# Patient Record
Sex: Male | Born: 1965
Health system: Southern US, Community
[De-identification: ages and names within clinical notes are randomized; demographics above are authoritative.]

## PROBLEM LIST (undated history)

## (undated) DIAGNOSIS — E039 Hypothyroidism, unspecified: Secondary | ICD-10-CM

## (undated) DIAGNOSIS — M545 Low back pain, unspecified: Secondary | ICD-10-CM

## (undated) DIAGNOSIS — R42 Dizziness and giddiness: Secondary | ICD-10-CM

## (undated) DIAGNOSIS — I639 Cerebral infarction, unspecified: Secondary | ICD-10-CM

## (undated) DIAGNOSIS — I10 Essential (primary) hypertension: Secondary | ICD-10-CM

## (undated) DIAGNOSIS — E669 Obesity, unspecified: Secondary | ICD-10-CM

## (undated) DIAGNOSIS — F419 Anxiety disorder, unspecified: Secondary | ICD-10-CM

## (undated) DIAGNOSIS — E7800 Pure hypercholesterolemia, unspecified: Secondary | ICD-10-CM

## (undated) DIAGNOSIS — R0602 Shortness of breath: Secondary | ICD-10-CM

## (undated) DIAGNOSIS — E119 Type 2 diabetes mellitus without complications: Secondary | ICD-10-CM

## (undated) HISTORY — PX: WRIST SURGERY: SHX841

## (undated) HISTORY — DX: Hypothyroidism, unspecified: E03.9

## (undated) HISTORY — DX: Obesity, unspecified: E66.9

## (undated) HISTORY — DX: Cerebral infarction, unspecified: I63.9

## (undated) HISTORY — PX: FOOT SURGERY: SHX648

## (undated) HISTORY — DX: Low back pain: M54.5

## (undated) HISTORY — DX: Pure hypercholesterolemia, unspecified: E78.00

## (undated) HISTORY — DX: Low back pain, unspecified: M54.50

## (undated) HISTORY — PX: SPINE SURGERY: SHX786

## (undated) HISTORY — PX: SHOULDER ARTHROSCOPY: SHX128

## (undated) HISTORY — DX: Essential (primary) hypertension: I10

## (undated) HISTORY — DX: Dizziness and giddiness: R42

## (undated) HISTORY — DX: Anxiety disorder, unspecified: F41.9

## (undated) HISTORY — DX: Shortness of breath: R06.02

---

## 1999-01-14 ENCOUNTER — Ambulatory Visit (HOSPITAL_COMMUNITY): Admission: RE | Admit: 1999-01-14 | Discharge: 1999-01-14 | Payer: Self-pay | Admitting: Family Medicine

## 1999-01-14 ENCOUNTER — Encounter: Payer: Self-pay | Admitting: Family Medicine

## 2007-02-16 HISTORY — PX: COLONOSCOPY: SHX174

## 2008-01-01 ENCOUNTER — Emergency Department (HOSPITAL_COMMUNITY): Admission: EM | Admit: 2008-01-01 | Discharge: 2008-01-01 | Payer: Self-pay | Admitting: Emergency Medicine

## 2008-01-05 ENCOUNTER — Ambulatory Visit: Payer: Self-pay | Admitting: Gastroenterology

## 2008-01-08 ENCOUNTER — Encounter: Payer: Self-pay | Admitting: Gastroenterology

## 2008-01-08 ENCOUNTER — Ambulatory Visit (HOSPITAL_COMMUNITY): Admission: RE | Admit: 2008-01-08 | Discharge: 2008-01-08 | Payer: Self-pay | Admitting: Gastroenterology

## 2008-01-08 ENCOUNTER — Ambulatory Visit: Payer: Self-pay | Admitting: Gastroenterology

## 2008-05-23 DIAGNOSIS — R197 Diarrhea, unspecified: Secondary | ICD-10-CM | POA: Insufficient documentation

## 2008-05-23 DIAGNOSIS — E119 Type 2 diabetes mellitus without complications: Secondary | ICD-10-CM

## 2008-05-23 DIAGNOSIS — J45909 Unspecified asthma, uncomplicated: Secondary | ICD-10-CM

## 2008-05-23 DIAGNOSIS — I10 Essential (primary) hypertension: Secondary | ICD-10-CM

## 2008-05-23 DIAGNOSIS — E7800 Pure hypercholesterolemia, unspecified: Secondary | ICD-10-CM

## 2008-05-23 DIAGNOSIS — Z8673 Personal history of transient ischemic attack (TIA), and cerebral infarction without residual deficits: Secondary | ICD-10-CM

## 2008-05-23 DIAGNOSIS — R109 Unspecified abdominal pain: Secondary | ICD-10-CM | POA: Insufficient documentation

## 2008-05-23 DIAGNOSIS — E1159 Type 2 diabetes mellitus with other circulatory complications: Secondary | ICD-10-CM

## 2008-05-23 DIAGNOSIS — Z794 Long term (current) use of insulin: Secondary | ICD-10-CM

## 2008-05-23 DIAGNOSIS — K7689 Other specified diseases of liver: Secondary | ICD-10-CM | POA: Insufficient documentation

## 2008-05-23 DIAGNOSIS — E039 Hypothyroidism, unspecified: Secondary | ICD-10-CM

## 2008-05-27 ENCOUNTER — Ambulatory Visit: Payer: Self-pay | Admitting: Gastroenterology

## 2008-05-27 ENCOUNTER — Encounter: Admission: RE | Admit: 2008-05-27 | Discharge: 2008-05-27 | Payer: Self-pay | Admitting: Family Medicine

## 2008-05-27 DIAGNOSIS — K529 Noninfective gastroenteritis and colitis, unspecified: Secondary | ICD-10-CM | POA: Insufficient documentation

## 2008-10-31 ENCOUNTER — Emergency Department (HOSPITAL_COMMUNITY): Admission: EM | Admit: 2008-10-31 | Discharge: 2008-10-31 | Payer: Self-pay | Admitting: Emergency Medicine

## 2009-12-25 ENCOUNTER — Encounter (INDEPENDENT_AMBULATORY_CARE_PROVIDER_SITE_OTHER): Payer: Self-pay | Admitting: *Deleted

## 2009-12-30 ENCOUNTER — Ambulatory Visit: Payer: Self-pay | Admitting: Internal Medicine

## 2009-12-30 DIAGNOSIS — E1169 Type 2 diabetes mellitus with other specified complication: Secondary | ICD-10-CM | POA: Insufficient documentation

## 2009-12-30 DIAGNOSIS — F17200 Nicotine dependence, unspecified, uncomplicated: Secondary | ICD-10-CM

## 2009-12-30 DIAGNOSIS — I6789 Other cerebrovascular disease: Secondary | ICD-10-CM

## 2009-12-30 DIAGNOSIS — E785 Hyperlipidemia, unspecified: Secondary | ICD-10-CM

## 2009-12-30 DIAGNOSIS — I80299 Phlebitis and thrombophlebitis of other deep vessels of unspecified lower extremity: Secondary | ICD-10-CM

## 2010-01-05 ENCOUNTER — Telehealth (INDEPENDENT_AMBULATORY_CARE_PROVIDER_SITE_OTHER): Payer: Self-pay | Admitting: Radiology

## 2010-01-06 ENCOUNTER — Ambulatory Visit: Payer: Self-pay | Admitting: Cardiology

## 2010-01-06 ENCOUNTER — Encounter (HOSPITAL_COMMUNITY)
Admission: RE | Admit: 2010-01-06 | Discharge: 2010-03-17 | Payer: Self-pay | Source: Home / Self Care | Attending: Cardiology | Admitting: Cardiology

## 2010-01-06 ENCOUNTER — Ambulatory Visit: Payer: Self-pay

## 2010-01-06 ENCOUNTER — Encounter: Payer: Self-pay | Admitting: Cardiology

## 2010-01-20 ENCOUNTER — Encounter: Payer: Self-pay | Admitting: Cardiology

## 2010-01-27 ENCOUNTER — Ambulatory Visit (HOSPITAL_COMMUNITY): Admission: RE | Admit: 2010-01-27 | Payer: Self-pay | Source: Home / Self Care | Admitting: Cardiology

## 2010-01-27 ENCOUNTER — Ambulatory Visit: Payer: Self-pay

## 2010-02-06 ENCOUNTER — Encounter: Payer: Self-pay | Admitting: Cardiology

## 2010-02-06 ENCOUNTER — Ambulatory Visit: Payer: Self-pay

## 2010-02-06 ENCOUNTER — Ambulatory Visit (HOSPITAL_COMMUNITY)
Admission: RE | Admit: 2010-02-06 | Discharge: 2010-02-06 | Payer: Self-pay | Source: Home / Self Care | Attending: Cardiology | Admitting: Cardiology

## 2010-03-19 NOTE — Assessment & Plan Note (Signed)
Summary: Cardiology Nuclear Testing  Nuclear Med Background Indications for Stress Test: Evaluation for Ischemia, Surgical Clearance  Indications Comments: Pre-op lowback surgery / Dr.Brooks  History: Asthma   Symptoms: Dizziness, DOE, SOB  Symptoms Comments: vertigo   Nuclear Pre-Procedure Cardiac Risk Factors: CVA, Hypertension, IDDM Type 2, Smoker Caffeine/Decaff Intake: none NPO After: 8:00 PM Lungs: clear IV 0.9% NS with Angio Cath: 22g     IV Site: R Hand IV Started by: Cathlyn Parsons, RN Chest Size (in) 52     Height (in): 67 Weight (lb): 277 BMI: 43.54 Tech Comments: Pt did not take any insulin,metformin or inhaler.  BS sugar at home 147.  Nuclear Med Study 1 or 2 day study:  1 day     Stress Test Type:  Eugenie Birks Reading MD:  Marca Ancona, MD     Referring MD:  S.Tennant Resting Radionuclide:  Technetium 3m Tetrofosmin     Resting Radionuclide Dose:  11 mCi  Stress Radionuclide:  Technetium 22m Tetrofosmin     Stress Radionuclide Dose:  32.9 mCi   Stress Protocol  Max Systolic BP: 140 mm Hg Lexiscan: 0.4 mg   Stress Test Technologist:  Milana Na, EMT-P     Nuclear Technologist:  Domenic Polite, CNMT  Rest Procedure  Myocardial perfusion imaging was performed at rest 45 minutes following the intravenous administration of Technetium 88m Tetrofosmin.  Stress Procedure  The patient received IV Lexiscan 0.4 mg over 15-seconds.  Technetium 22m Tetrofosmin injected at 30-seconds.  There were no significant changes with infusion.  Quantitative spect images were obtained after a 45 minute delay.  QPS Raw Data Images:  Normal; no motion artifact; normal heart/lung ratio. Stress Images:  Mild inferior perfusion defect.  Rest Images:  Mild inferior perfusion defect.  Subtraction (SDS):  Primarily fixed mild inferior perfusion defect.  Transient Ischemic Dilatation:  0.99  (Normal <1.22)  Lung/Heart Ratio:  0.36  (Normal <0.45)  Quantitative Gated Spect  Images QGS EDV:  146 ml QGS ESV:  76 ml QGS EF:  48 % QGS cine images:  Mild global hypokinesis.    Overall Impression  Exercise Capacity: Lexiscan with no exercise. BP Response: Normal blood pressure response. Clinical Symptoms: Short of breath.  ECG Impression: No significant ST segment change suggestive of ischemia. Overall Impression: Low risk overall with primarily fixed mild inferior perfusion defect without evidence for significant ischemia.  This could be diaphragmatic attenuation.  Overall Impression Comments: Would recommend echo to reassess LV systolic function.   Appended Document: Cardiology Nuclear Testing copy send to dr. Deborah Chalk

## 2010-03-19 NOTE — Progress Notes (Signed)
Summary: nuc pre-procedure  Phone Note Outgoing Call   Call placed by: Harlow Asa CNMT Call placed to: Patient Reason for Call: Confirm/change Appt Summary of Call: Reviewed information on My[employer] Information Sheet (see scanned document for further details).  Spoke with patient.  Initial call taken by: Harlow Asa CNMT     Nuclear Med Background Indications for Stress Test: Evaluation for Ischemia, Surgical Clearance  Indications Comments: Pre-op lowback surgery / Dr.Brooks  History: Asthma   Symptoms: Dizziness  Symptoms Comments: vertigo   Nuclear Pre-Procedure Cardiac Risk Factors: CVA, Hypertension, IDDM Type 2, Smoker Height (in): 67

## 2010-03-19 NOTE — Letter (Signed)
SummaryScience writer Pulmonary Care Appointment Letter  [facility]  [address]   [place], [place] [address]   Phone: [phone]  Fax: [phone]    12/25/2009 MRN: [id number]  Charles Pennington [address] [place], [place]  [address]  Dear [name],   Our office is attempting to contact you about an appointment.  Please call our office at [phone] to re-schedule this appointment with  one of our pulmonary dr's. Your appt with Dr. Delford Field has been canceled as he will not be in our office on Tues. Jan 06, 2010.  Our registration staff is prepared to assist you with any questions you may have.    Thank you,   Nature conservation officer Pulmonary Division

## 2010-03-19 NOTE — Assessment & Plan Note (Signed)
Summary: Pulmonary/ preop clearance for copd/ smoking > rx advair   Primary Charles Pennington/Referring Charles Pennington:  Charles Pennington   History of Present Illness: 23 yowm active smoker referred for preop neck surgery by Charles Pennington .   December 30, 2009  1st pulmonary office eval cc asthma since childhood goes away for months  and needs no rx  for months at time seems worse when leaves fall and when pollen shows up last dose around the first of november ,  worse cough usually with flare.  Pt denies any significant sore throat, dysphagia, itching, sneezing,  nasal congestion or excess secretions,  fever, chills, sweats, unintended wt loss, pleuritic or exertional cp, hempoptysis, change in activity tolerance  orthopnea pnd or leg swelling Pt also denies any obvious fluctuation in symptoms with weather or environmental change or other alleviating or aggravating factors x as above.  proaire always eliminates symptoms.  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 C V A / Stroke Deep Vein Thrombosis/Phlebitis Diabetes Hyperlipidemia Hypertension  Family History: mother with asthma   Social History: started in 1983--current 1/2 ppd single-lives alone-disabledSmoking Status:  current  Review of Systems  The patient denies anorexia, fever, weight loss, weight gain, vision loss, decreased hearing, hoarseness, chest pain, syncope, dyspnea on exertion, peripheral edema, prolonged cough, headaches, hemoptysis, abdominal pain, melena, hematochezia, severe indigestion/heartburn, hematuria, incontinence, genital sores, muscle weakness, suspicious skin lesions, transient blindness, difficulty walking, depression, unusual weight change, abnormal bleeding, enlarged lymph nodes, angioedema, breast masses, and testicular masses.     Vital Signs:  Patient profile:   45 year old male Height:      67 inches Weight:      267 pounds BMI:     41.97 O2 Sat:      96 % on Room air Temp:     98.9 degrees F oral Pulse rate:   80 / minute Pulse rhythm:   regular BP sitting:   126 / 82  (right arm)  Vitals Entered By: Philipp Deputy CMA (December 30, 2009 11:28 AM)  O2 Flow:  Room air  Physical Exam  Additional Exam:  wt 261 > 267 December 30, 2009 amb wm with congested sounding cough on fvc maneuver. HEENT: nl dentition, turbinates, and orophanx. Nl external ear canals without cough reflex NECK :  without JVD/Nodes/TM/ nl carotid upstrokes bilaterally LUNGS: no acc muscle use, clear to A and P bilaterally without cough on insp or exp maneuvers CV:  RRR  no s3 or murmur or increase in P2, no edema   ABD:  soft and nontender with nl excursion in the supine position. No bruits or organomegaly, bowel sounds nl MS:  warm without deformities, calf tenderness, cyanosis or clubbing SKIN: warm and dry without lesions   NEURO:  alert, approp, no deficits     Impression & Recommendations:  Problem # 1:  ASTHMA (ICD-493.90) More bronchitis than asthma at present suggestive of mucociliary dysfunction from smoking, rec stop x 2 weeks and advair 250 two times a day x 2 weeks preop    DDX of  difficult airways managment all start with A and  include Adherence, Ace Inhibitors, Acid Reflux, Active Sinus Disease, Alpha 1 Antitripsin deficiency, Anxiety masquerading as Airways dz,  ABPA,  allergy(esp in young), Aspiration (esp in elderly), Adverse effects  of DPI,  Active smokers, plus one B  = Beta blocker use..   Adherence: I spent extra time with the patient today explaining optimal DPI   technique.  This improved from  50-90%  Active smoking:  see #2  Problem # 2:  SMOKER (ICD-305.1)   I emphasized that although we never turn away smokers from the pulmonary clinic, we do ask that they understand that the recommendations that  were made won't work nearly as well in the presence of continued cigarette exposure and we may reach a point where we can't help the patient if he/she can't quit smoking.    I reviewed the Flethcher curve with her that basically says if you quit smoking when your best day FEV1 is still well preserved it is highly unlikely you will progress to severe disease and informed the patient there was no medication on the market that has proven to change the curve or the likelihood of progression   Orders: New Patient Level V (91478)  Medications Added to Medication List This Visit: 1)  Proair Hfa 108 (90 Base) Mcg/act Aers (Albuterol sulfate) .... 2 puffs every 4 hrs as needed 2)  Ambien 10 Mg Tabs (Zolpidem tartrate) .Marland Kitchen.. 1 by mouth at bedtime 3)  Amlodipine Besylate 5 Mg Tabs (Amlodipine besylate) .Marland Kitchen.. 1 by mouth once daily  Patient Instructions: 1)  Ideally you should stop smoking before surgery and use advair 250 one twice daily x 2 weeks 2)  You are cleared for surgery 3)  If you find that your breathing limits you we need to see you in [facility] for full PFT's

## 2010-03-23 ENCOUNTER — Ambulatory Visit (HOSPITAL_COMMUNITY)
Admission: RE | Admit: 2010-03-23 | Discharge: 2010-03-23 | Disposition: A | Discharge status: Home / Self Care | Payer: Medicare Other | Source: Ambulatory Visit | Attending: Orthopedic Surgery | Admitting: Orthopedic Surgery

## 2010-03-23 ENCOUNTER — Other Ambulatory Visit (HOSPITAL_COMMUNITY): Payer: Self-pay | Admitting: Orthopedic Surgery

## 2010-03-23 ENCOUNTER — Encounter (HOSPITAL_COMMUNITY)
Admission: RE | Admit: 2010-03-23 | Discharge: 2010-03-23 | Disposition: A | Discharge status: Home / Self Care | Payer: Medicare Other | Source: Ambulatory Visit | Attending: Family Medicine | Admitting: Family Medicine

## 2010-03-23 DIAGNOSIS — M48061 Spinal stenosis, lumbar region without neurogenic claudication: Secondary | ICD-10-CM

## 2010-03-23 DIAGNOSIS — Z01818 Encounter for other preprocedural examination: Secondary | ICD-10-CM | POA: Insufficient documentation

## 2010-03-23 LAB — BASIC METABOLIC PANEL
BUN: 14 mg/dL (ref 6–23)
Calcium: 9.8 mg/dL (ref 8.4–10.5)
Chloride: 98 mEq/L (ref 96–112)
Creatinine, Ser: 1.1 mg/dL (ref 0.4–1.5)

## 2010-03-23 LAB — CBC
MCH: 32.3 pg (ref 26.0–34.0)
MCHC: 35.1 g/dL (ref 30.0–36.0)
MCV: 92 fL (ref 78.0–100.0)
Platelets: 228 10*3/uL (ref 150–400)
RBC: 5.11 MIL/uL (ref 4.22–5.81)

## 2010-03-23 LAB — TYPE AND SCREEN

## 2010-03-23 LAB — ABO/RH: ABO/RH(D): A POS

## 2010-03-23 LAB — SURGICAL PCR SCREEN: Staphylococcus aureus: POSITIVE — AB

## 2010-03-25 ENCOUNTER — Inpatient Hospital Stay (HOSPITAL_COMMUNITY)
Admission: RE | Admit: 2010-03-25 | Discharge: 2010-03-29 | DRG: 453 | Disposition: A | Discharge status: Home / Self Care | Payer: Medicare Other | Source: Ambulatory Visit | Attending: Orthopedic Surgery | Admitting: Orthopedic Surgery

## 2010-03-25 ENCOUNTER — Inpatient Hospital Stay (HOSPITAL_COMMUNITY): Payer: Medicare Other

## 2010-03-25 DIAGNOSIS — J45909 Unspecified asthma, uncomplicated: Secondary | ICD-10-CM | POA: Diagnosis present

## 2010-03-25 DIAGNOSIS — F411 Generalized anxiety disorder: Secondary | ICD-10-CM | POA: Diagnosis present

## 2010-03-25 DIAGNOSIS — F17200 Nicotine dependence, unspecified, uncomplicated: Secondary | ICD-10-CM | POA: Diagnosis present

## 2010-03-25 DIAGNOSIS — E119 Type 2 diabetes mellitus without complications: Secondary | ICD-10-CM | POA: Diagnosis present

## 2010-03-25 DIAGNOSIS — M47817 Spondylosis without myelopathy or radiculopathy, lumbosacral region: Principal | ICD-10-CM | POA: Diagnosis present

## 2010-03-25 DIAGNOSIS — K219 Gastro-esophageal reflux disease without esophagitis: Secondary | ICD-10-CM | POA: Diagnosis present

## 2010-03-25 DIAGNOSIS — J95821 Acute postprocedural respiratory failure: Secondary | ICD-10-CM | POA: Diagnosis not present

## 2010-03-25 DIAGNOSIS — E039 Hypothyroidism, unspecified: Secondary | ICD-10-CM | POA: Diagnosis present

## 2010-03-25 DIAGNOSIS — I10 Essential (primary) hypertension: Secondary | ICD-10-CM | POA: Diagnosis present

## 2010-03-25 DIAGNOSIS — M4310 Spondylolisthesis, site unspecified: Secondary | ICD-10-CM | POA: Diagnosis present

## 2010-03-25 DIAGNOSIS — M5137 Other intervertebral disc degeneration, lumbosacral region: Secondary | ICD-10-CM | POA: Diagnosis present

## 2010-03-25 DIAGNOSIS — M51379 Other intervertebral disc degeneration, lumbosacral region without mention of lumbar back pain or lower extremity pain: Secondary | ICD-10-CM | POA: Diagnosis present

## 2010-03-25 DIAGNOSIS — Z8673 Personal history of transient ischemic attack (TIA), and cerebral infarction without residual deficits: Secondary | ICD-10-CM

## 2010-03-25 DIAGNOSIS — E7800 Pure hypercholesterolemia, unspecified: Secondary | ICD-10-CM | POA: Diagnosis present

## 2010-03-25 LAB — BLOOD GAS, ARTERIAL
Acid-Base Excess: 0.9 mmol/L (ref 0.0–2.0)
Bicarbonate: 26.1 mEq/L — ABNORMAL HIGH (ref 20.0–24.0)
FIO2: 1 %
O2 Saturation: 99.5 %
pO2, Arterial: 527 mmHg — ABNORMAL HIGH (ref 80.0–100.0)

## 2010-03-25 LAB — GLUCOSE, CAPILLARY

## 2010-03-26 ENCOUNTER — Inpatient Hospital Stay (HOSPITAL_COMMUNITY): Payer: Medicare Other

## 2010-03-26 DIAGNOSIS — J9600 Acute respiratory failure, unspecified whether with hypoxia or hypercapnia: Secondary | ICD-10-CM

## 2010-03-26 LAB — PROTIME-INR: INR: 0.99 (ref 0.00–1.49)

## 2010-03-26 LAB — GLUCOSE, CAPILLARY
Glucose-Capillary: 127 mg/dL — ABNORMAL HIGH (ref 70–99)
Glucose-Capillary: 188 mg/dL — ABNORMAL HIGH (ref 70–99)
Glucose-Capillary: 205 mg/dL — ABNORMAL HIGH (ref 70–99)
Glucose-Capillary: 252 mg/dL — ABNORMAL HIGH (ref 70–99)

## 2010-03-26 LAB — COMPREHENSIVE METABOLIC PANEL
Albumin: 2.8 g/dL — ABNORMAL LOW (ref 3.5–5.2)
Alkaline Phosphatase: 42 U/L (ref 39–117)
BUN: 15 mg/dL (ref 6–23)
CO2: 28 mEq/L (ref 19–32)
Chloride: 101 mEq/L (ref 96–112)
Creatinine, Ser: 1.12 mg/dL (ref 0.4–1.5)
GFR calc Af Amer: >60 mL/min (ref >60)
GFR calc non Af Amer: >60 mL/min (ref >60)
Potassium: 4.2 mEq/L (ref 3.5–5.1)
Sodium: 137 mEq/L (ref 135–145)
Total Bilirubin: 0.5 mg/dL (ref 0.3–1.2)

## 2010-03-26 LAB — CBC
MCHC: 34.3 g/dL (ref 30.0–36.0)
Platelets: 164 10*3/uL (ref 150–400)
RDW: 12.8 % (ref 11.5–15.5)

## 2010-03-26 LAB — APTT: aPTT: 26 seconds (ref 24–37)

## 2010-03-27 LAB — GLUCOSE, CAPILLARY
Glucose-Capillary: 282 mg/dL — ABNORMAL HIGH (ref 70–99)
Glucose-Capillary: 215 mg/dL — ABNORMAL HIGH (ref 70–99)

## 2010-03-27 LAB — CBC
Hemoglobin: 10 g/dL — ABNORMAL LOW (ref 13.0–17.0)
MCH: 32.1 pg (ref 26.0–34.0)
MCV: 93.9 fL (ref 78.0–100.0)
Platelets: 156 10*3/uL (ref 150–400)
RBC: 3.12 MIL/uL — ABNORMAL LOW (ref 4.22–5.81)

## 2010-03-27 LAB — BASIC METABOLIC PANEL
BUN: 11 mg/dL (ref 6–23)
CO2: 31 mEq/L (ref 19–32)
Chloride: 96 mEq/L (ref 96–112)
Creatinine, Ser: 0.9 mg/dL (ref 0.4–1.5)
Glucose, Bld: 240 mg/dL — ABNORMAL HIGH (ref 70–99)

## 2010-03-28 LAB — GLUCOSE, CAPILLARY
Glucose-Capillary: 220 mg/dL — ABNORMAL HIGH (ref 70–99)
Glucose-Capillary: 171 mg/dL — ABNORMAL HIGH (ref 70–99)

## 2010-03-29 LAB — GLUCOSE, CAPILLARY: Glucose-Capillary: 187 mg/dL — ABNORMAL HIGH (ref 70–99)

## 2010-03-31 LAB — POCT I-STAT 7, (LYTES, BLD GAS, ICA,H+H)
Acid-Base Excess: 3 mmol/L — ABNORMAL HIGH (ref 0.0–2.0)
Bicarbonate: 30.6 mEq/L — ABNORMAL HIGH (ref 20.0–24.0)
HCT: 38 % — ABNORMAL LOW (ref 39.0–52.0)
Hemoglobin: 13.3 g/dL (ref 13.0–17.0)
Hemoglobin: 12.9 g/dL — ABNORMAL LOW (ref 13.0–17.0)
Patient temperature: 36.8
Potassium: 3.7 mEq/L (ref 3.5–5.1)
Sodium: 139 mEq/L (ref 135–145)
TCO2: 32 mmol/L (ref 0–100)
TCO2: 31 mmol/L (ref 0–100)
pCO2 arterial: 44 mmHg (ref 35.0–45.0)
pH, Arterial: 7.332 — ABNORMAL LOW (ref 7.350–7.450)
pH, Arterial: 7.437 (ref 7.350–7.450)
pO2, Arterial: 132 mmHg — ABNORMAL HIGH (ref 80.0–100.0)

## 2010-04-02 LAB — GLUCOSE, CAPILLARY: Glucose-Capillary: 98 mg/dL (ref 70–99)

## 2010-04-02 NOTE — Op Note (Signed)
NAMEKAMARIAN, SAHAKIAN            ACCOUNT NO.:  [id number]  MEDICAL RECORD NO.:  [id number]           PATIENT TYPE:  O  LOCATION:  XRAY                         FACILITY:  [facility]  PHYSICIAN:  Alvy Beal, MD    DATE OF BIRTH:  1965/11/01  DATE OF PROCEDURE:  03/25/2010 DATE OF DISCHARGE:  03/23/2010                              OPERATIVE REPORT   PREOPERATIVE DIAGNOSIS:  Lumbar spondylolisthesis L3-4 with lumbar spinal stenosis, L3-4, L4-5.  POSTOPERATIVE DIAGNOSIS:  Lumbar spondylolisthesis L3-4 with lumbar spinal stenosis, L3-4, L4-5.  OPERATIVE PROCEDURE: 1. Anterolateral retroperitoneal approach for interbody fusion with     invasive 12 x 12 degree, 0 lordotic large cage. 2. Posterior lumbar decompression L3-4, L4-L5. 3. Posterior arthrodesis with autograft bone L3-4, posterior     instrumentation with segmental fixation with pedicle screw L3-4.  COMPLICATIONS:  None.  CONDITION:  Stable.  HISTORY:  This is a very pleasant 45 year old gentleman who has beenhaving severe debilitating back, buttock, and right radicular leg pain. Attempts at conservative management have failed, so we elected to proceed with surgery.  The patient had underlying instability at L3-4 and so the decision was made in addition to the decompression to prevent further iatrogenic instability to do a fusion.  All the risks, benefits, and alternatives were discussed with the patient.  Consent was obtained.  OPERATIVE NOTE:  The patient was brought to the operating room, placed supine on the operating table.  After successful induction of general anesthesia, endotracheal intubation, TED, SCDs, Foley, and lower extremity EMG neuromonitoring needles were placed.  The patient was then turned.  Appropriate time-out was then done confirming patient procedure and all other pertinent important data.  Once this was done, the patient was turned into left lateral decubitus position with the left side up. He  was then taped down to secure to the table appropriately.  Table was broke and the lateral side was prepped and draped in standard fashion. We confirmed satisfactory level and then a 3-inch incision was made over the lateral aspect over the L3-4 disk space.  Sharp dissection was carried out through the deep subcutaneous tissue to the fascia of the external oblique.  This was sharply incised.  A second incision was made approximately one fingerbreadth away from the first.  I then bluntly dissected through the soft tissue and paraspinal muscles until I hit the fascia.  I popped through the fascia and then I was able to palpate with my fingertip the transverse process as well as the iliopsoas.  I then rotated my finger up until I could palpate it through the first incision.  Using this 2 finger 2 incision technique, I was able to safely dissect into the retroperitoneal space for the probe.  I then advanced the trocar over my finger down to the iliopsoas.  I then used x- ray to confirm that I was at the appropriate level.  I then probed using neuro monitoring the iliopsoas to identify the position of the lumbar plexus.  Once I was safely away from the lumbar plexus, I then made a single pass through the iliopsoas down to the disk space.  I then placed a guide pin to hold my trocar to hold the dilator in place.  I then consensually dilated again testing in all 4 quadrants with neuro monitoring to confirm that there was no risk to the lumbar plexus.  I then placed the retracting tubes over the final dilator and secured it to the table.  I then was able to look down the portal to confirm satisfactory position.  I then locked the posterior blade in place and then exposed the L3-4 disk space.  Once this was properly exposed and I had adequate visualization, I placed the 2 lights down the cannula, so I had excellent visualization.  I confirmed satisfactory position in both the AP and lateral planes.   I then incised the annulus with a 10 blade scalpel and using a series of pituitary rongeurs, curettes, and Kerrison rongeurs, I removed all the disk material.  Using a Cobb elevator, I released the annulus from the contralateral side.  At this point, I was able to trial with appropriate sized rasps and elected to use the large 22 x 12 interbody cage.  This was packed with cortical cancellous chips mixed with DBX.  It was malleted to the appropriate depth.  I did rasp the endplates, so I had bleeding bone prior to placing the graft.  Once I had the graft at the appropriate depth, I confirmed satisfactory position in both planes.  Pleased with this location, I then irrigated the wound copiously with normal saline, used bipolar electrocautery to obtain hemostasis and then removed the retractors.  I then closed the fascia with interrupted #1 Vicryl suture, the superficial 2-0 Vicryl suture, and 3-0 Monocryl for the skin.  At this point with both incision sites closed, the patient was then turned supine on the operative table and then rotated onto the [employer] spine frame.  All bony prominences were well-padded, the arms were placed overhead, and the lumbar spine was prepped and draped in standard fashion.  The patient was redosed with antibiotics for the second portion of the procedure.  A midline incision was made starting at the superior aspect of L3 proceeding down to the midportion of L5.  Sharp dissection was carried out down to the deep fascia.  Deep fascia was sharply incised.  Using Cobb elevators, I resected the paraspinal muscles to expose the L3, L4, and L5 spinous processes and laminae.  With this completely exposed, I I then proceeded with the pedicle screw fixation.  I placed a probe through a lateral fascial stab incision and advanced down to the lateral aspect of the L3 pedicle.  I confirmed position in both the AP and lateral planes and then advanced the probe through the  pedicle and into the vertebral body.  At no point were there adverse radiographic or neuro monitoring events.  I repeated this procedure at L4 and on the contralateral side.  I then placed cannula down into the each to the pedicle.  At this point with all 4 pedicles properly positioned, I then tapped over each guide pin and then placed a 45-mm long screw.  The left L4 pedicle was malfunction and there was dissociation of the polyaxial head from the screw.  This was replaced with a 15-mm long screw.  At this point with all 4 pedicle screws in place, I then proceeded with the decompression.  I removed the spinous process of L5 and L4 and the majority that of L3.  There was significant osteophyte formation and facet disease.  Using osteotomes, I was able to remove the majority of the diseased facet complex.  I then used a small nerve hook to develop a plane underneath the L5 lamina and I performed a complete laminectomy of L5.  With the L4-5 completely decompressed, I then performed a complete laminectomy of L4 and a partial laminotomy of L3.  This allowed me excellent visualization and complete decompression centrally from L3 down to L5.  I then carried my dissection into the lateral recess. There was significant epidural veins which were coagulated with bipolar electrocautery.  Using Kerrison rongeurs, I was able to resect all the overhanging osteophyte until I was able to visualize the medial border of the L3, L4, and L5 pedicles.  At this point with the lateral recess decompression complete, I then did a generous foraminotomy down to L5 of L4 and L3.  This was then repeated on the contralateral side.  At this point with a very satisfactory decompression, I was able take a [facility], passed it superiorly, inferiorly, palpated the L3 pedicle medially and inferiorly, visualized and palpated the superior medial and inferior aspect of the L4 pedicle and palpated and visualized the  medial aspect of the L5 pedicle.  The Lorette Ang was able to clearly go out each of the foramen without difficulty and there was no evidence of breach of the pedicle by the pedicle screws.  At this point with the decompression complete, I copiously irrigated with normal saline, debrided the posterior lateral gutter and then packed the bone that I harvested from the decompression of the posterior lateral gutter.  I then took 2 rods, secured them to the screws, and torqued down the screws to tighten it. I then irrigated again, placed a thrombin-soaked Gelfoam patty over the exposed thecal sac, placed a drain, and then closed the deep fascia with interrupted #1 Vicryl suture, superficial 2-0 Vicryl suture, and a 3-0 Monocryl.  The drain stitch was also used.  Because of the patient's significant medical history, the decision was made to keep him intubated overnight for improved pain control.  I consulted the critical care team.  They will be involved in his care and control the event.  I did manage to allow the patient to wake up some in the OR and he was moving all 4 extremities.     Alvy Beal, MD     DDB/MEDQ  D:  03/25/2010  T:  03/26/2010  Job:  [id number]  Electronically Signed by Venita Lick MD on 04/02/2010 02:10:07 PM

## 2010-04-13 NOTE — Discharge Summary (Signed)
  NAMELENNYN, [name] NO.:  [id number]  MEDICAL RECORD NO.:  [id number]           PATIENT TYPE:  I  LOCATION:  5013                         FACILITY:  [facility]  PHYSICIAN:  Charles Pennington, Charles Pennington    DATE OF BIRTH:  [birth year]  DATE OF ADMISSION:  03/26/2010 DATE OF DISCHARGE:  03/29/2010                              DISCHARGE SUMMARY   ADMITTING DIAGNOSIS:  Lumbar degenerative spondylosis with radicular leg pain, L3-4, L4-5.  DISCHARGE DIAGNOSIS:  Lumbar degenerative spondylosis with radicular leg pain, L3-4, L4-5.  HISTORY:  This is a very pleasant 45 year old gentleman who was having significant back, buttock, and bilateral leg pain.  Clinical evaluation determined the patient had degenerative lumbar disk disease with a slight anterolisthesis at L3-4 with spinal stenosis at 3-4 and 4-5. Because of the failure of conservative management consisting of physical therapy, anti-inflammatory medications, observation, we elected to proceed with surgery.  All appropriate risks and benefits of surgery were discussed with the patient.  Patient's medical history includes hypertension, hypercholesterolemia, hypothyroidism, diabetes, asthma, he had a previous right hand surgeries.  He is on metformin, glyburide, amlodipine, lisinopril, levothyroxine, Crestor, Diflucan, Ambien, Xanax.  No known drug allergies.  On the day of admission, the patient underwent a lateral L3-4 interbody fusion, diskectomy, and decompression with posterior pedicle screw fixation and decompression at L3-4, with posterior L4-5 foraminotomy and decompression.  Because of the extensive procedure, postoperatively the patient was admitted, intubated to the ICU.  The decision was made to keep him intubated because of the lengthy procedural time.  On postoperative day #1, the patient was doing well.  He was alert and oriented x3 and he had no significant complaints.  The patient was extubated.  His  compartments were soft, nontender.  His incisions were clean, dry, and intact.  Ultimately, he was transferred from the ICU to the floor.  His Foley was removed.  X-rays were satisfactory.  He was ambulating, tolerating a regular diet.  He was seen by Physical Therapy, appropriate discharge instructions were provided.  Ultimately, he was discharged to home after clearing PT, having regular bowel movements, tolerating regular diet.  He was neurologically intact.  The patient will be discharged with preprinted instructions.  He will follow up with me.     Charles Pennington, Charles Pennington     DDB/MEDQ  D:  04/11/2010  T:  04/12/2010  Job:  [id number]  Electronically Signed by Venita Lick Charles Pennington on 04/13/2010 05:02:31 PM

## 2010-04-20 NOTE — Op Note (Signed)
Charles Pennington, SCHROETER NO.:  [id number]  MEDICAL RECORD NO.:  [id number]           PATIENT TYPE:  LOCATION:                                 FACILITY:  PHYSICIAN:  Alvy Beal, MD    DATE OF BIRTH:  1965/11/15  DATE OF PROCEDURE:  03/30/2010 DATE OF DISCHARGE:                              OPERATIVE REPORT   PREOPERATIVE DIAGNOSES: 1. Degenerative spinal stenosis L3-4, L4-5. 2. Neurogenic claudication secondary to lumbar spinal stenosis, 3-4     and 4-5 with degenerative slip at L3-4.  POSTOPERATIVE DIAGNOSES: 1. Degenerative spinal stenosis L3-4, L4-5. 2. Neurogenic claudication secondary to lumbar spinal stenosis, 3-4     and 4-5 with degenerative slip at L3-4.  OPERATIVE PROCEDURE: 1. Anterolateral interbody fusion L3-4 utilizing the NuVasive PEEK     spacer, size 16 packed with Osteocel. 2. Posterior decompression L3-4, L4-5. 3. Posterolateral arthrodesis, L3-4 with local bone (autograft). 4. Posterolateral segmental instrumentation with NuVasive pedicle     screw system.  COMPLICATIONS:  None.  CONDITION:  Stable.  HISTORY:  This is a very pleasant 45 year old gentleman who is under my care for sometime.  The patient was having significant debilitating back pain and bilateral leg pain.  Preoperative clinical imaging demonstrated degenerative disk disease with a slight instability at L3-4 along with spinal stenosis at 3-4 and 4-5.  Because of the failure of conservative management, which consisted of injection therapy, observation, pain medications, and physical therapy, we elected to proceed with surgery. All appropriate risks, benefits, and alternatives were explained to the patient and consent was obtained.  Prior to surgery because of his cardiac history, preoperative medical clearance was done.  The patient's past medical, surgical, family, and social history is included in my office notes, please refer to them for specifics.  OPERATIVE  NOTE:  The patient was brought to the operating room, placed supine on the operating table.  After successful induction of general anesthesia and endotracheal intubation, TEDs, SCDs, and Foley were inserted and appropriate intraoperative neuro monitoring devices were attached.  He was then put in the left decubitus position (left side up) and secured to the stretcher on a radiolucent table.  The table was then properly positioned and x-ray was brought in to confirm the proper position.  Once this was done, the lateral aspect of the body was prepped and draped in a standard fashion.  An appropriate time-out was then done to confirm patient, procedure, and all other pertinent port data.  At this point in time, using fluoroscopic guidance I identified the borders of the L3-4 disk space.  The skin was then infiltrated with 0.25% Marcaine with epinephrine and a lateral incision was made.  I then dissected through the subcutaneous tissue to the fascia of the external oblique and this was sharply incised.  I then made a counter incision one fingerbreadth posteriorly from the lateral incision.  I then advanced my finger to the deep fascia using Tresa Endo to pop through the deep fascia and this put me into the retroperitoneal space.  I then mobilized the retroperitoneal fat anteriorly and then brought my finger up to the lateral  incision that I had made.  I then palpated this through the lateral incision and then dissected bluntly through the oblique muscles until I saw my finger.  I then placed the dilating probes on top of my finger and advanced it down to the level of the psoas muscle.  I could freely palpate the ventral aspect of the transverse process and the psoas muscle.  At this point, I then checked x-ray to ensure that I was properly positioned over the L3-4 disk space at the junction of the anterior two-thirds, posterior one-third.  I then attached the neuro monitoring device and probed  the psoas to ensure that I was not near the lumbar plexus.  Once I confirmed radiographically and electrodiagnostically that I was not near the lumbar plexus and I was properly positioned over the disk space, I advanced the probe through the psoas to the disk space.  I then probed in all four quadrants, superior, anterior, inferiorly, and posteriorly to again ensure that I was in a safe working zone.  Once I confirmed this, I then sequentially dilated and with each dilation I probed again with the neurodiagnostics to ensure there was no pressure on the lumbar plexus.  Once I had the final probe down, I then placed the MIS blades down through the psoas on the lateral aspect of the disk space.  I then gently opened the retractors 2 clicks superior, inferior, and posteriorly, and then took an x-ray to confirm that I was again properly situated over the disk space.  Once this was confirmed, I secured the retracting system to the bed with the Omni retractor and then positioned it so that I had clear visualization of the lateral disk space.  I then placed a fourth retracting blade anteriorly.  The lip of this blade was just on the anterior surface of the disk space.  At this point, I had excellent visualization of the L3-4 disk space.  Again I confirmed in both the AP and lateral planes.  I was properly positioned at the L3-4 disk.  Once this was done, I proceeded with the diskectomy.  A 15-blade scalpel was used to perform an annulotomy and then I used pituitary rongeurs, curettes, and Kerrison rongeurs to remove all the disk material at L3-4. I then scraped the endplates to ensure I had bleeding bone and then I wrapped them.  I then made sure also with a Cobb elevator that I had released the annulus on the far lateral side.  Once I had done this, I then sequentially measured and then placed the appropriate-sized PEEK interbody cage, packed with Osteocel.  Once I had this properly positioned  at the appropriate depth, I confirmed satisfactory position in both the AP and lateral planes.  I had excellent fixation with a good press-fit.  At this point, I then irrigated the wound copiously with normal saline and then gently started removing the retractor coagulating any bleeding muscle with bipolar electrocautery.  Once the retractor was out, I then irrigated the wound copiously with normal saline again, reapproximated the fascia of the oblique with interrupted #1 Vicryl sutures, superficial 2-0 Vicryl sutures, and 3-0 Monocryl for the skin. I also closed the second incision I made with my finger with interrupted 2-0 Vicryl sutures and 3-0 Monocryl.  At this point with this portion of surgery completed, the patient was returned to the supine position on the operative table.  At this point, Jean Rosenthal table with the spine frame was brought into the surgical  suite and the patient was transferred into the prone position onto the [place] spine frame.  All bony prominences were well-padded.  The back was then prepped and draped again.  I then made an incision starting at the superior aspect of L3 proceeding to the inferior aspect of L4.  I then sharply dissected down to the deep fascia.  The deep fascia was sharply incised.  Using a Cobb elevator, I stripped paraspinal muscles to expose the L4 and L3 spinous processes, facet joints, and transverse process of L3 and L4.  Then using direct visualization as well as neurodiagnostics and fluoro view advanced the trocar through the transpedicular into the L3 vertebral body.  I confirmed satisfactory position in both the AP and lateral planes and there was no electrodiagnostic evidence of pedicle breach.  I then tapped and then placed an appropriate-sized pedicle screw at that level. I repeated this procedure on the contralateral side and again at the L4 level.  At this point with the pedicle screws in place, I proceeded with my decompression.  I  removed the entire spinous process of L4 and the majority of that of L3.  This bone was then saved for the posterolateral arthrodesis.  Once the spinous process was removed, using a micro nerve hook I developed a plane underneath the L4 lamina and using a 3-mm and 2-mm Kerrison performed a complete laminectomy of L4 and a subtotal laminotomy of L3.  This allowed me adequate central decompression.  I then proceeded out into the lateral gutter removing the osteophyte and thickened ligamentum flavum.  I carried my dissection out laterally until I could palpate and visualize the L3 and L4 pedicles.  At this point, I then proceeded inferiorly in order to ensure that the L4 neural foramen was adequately decompressed.  I removed the overhanging osteophytes from the facet joints to complete my lateral recess decompression.  I then took my 2 and 3-mm Kerrison out the neural foramen of L3 and L4 to also ensure that adequate foraminotomy.  This was done bilaterally.  Hemostasis was then obtained by bipolar electrocautery on the prominent epidural veins.  Once this was completed, I irrigated the wound copiously with normal saline and then palpated and visualized the pedicles of L3 and L4, confirming that there was no breach medially or inferiorly.  Once this was complete, I had an adequate decompression.  I then decorticated the transverse process of L3 and L4 and used the local bone that I had harvested and packed it into the posterolateral gutter for posterolateral arthrodesis.  I then placed thrombin-soaked Gelfoam patty over the exposed thecal sac and closed the wound in a layered fashion with interrupted #1 Vicryl sutures, 2-0 Vicryl sutures, and 3-0 Monocryl.  Steri-Strips and dry dressings were then applied.  The decision was made due to the length of surgery to keep the patient intubated for improved pain control; however, the anesthesia was light enough to visualize the moving all four  extremities.  PLAN:  At this point, the patient was transferred to the ICU because he remained intubated.     Alvy Beal, MD     DDB/MEDQ  D:  04/13/2010  T:  04/14/2010  Job:  [id number]  Electronically Signed by Venita Lick MD on 04/20/2010 05:21:04 PM

## 2010-06-30 NOTE — Consult Note (Signed)
NAME:  Charles Pennington, Charles Pennington NO.:  [id number]   MEDICAL RECORD NO.:  [id number]          PATIENT TYPE:  AMB   LOCATION:  DAY                           FACILITY:  [facility]   PHYSICIAN:  Kassie Mends, M.D.      DATE OF BIRTH:  07-Jul-1965   DATE OF CONSULTATION:  DATE OF DISCHARGE:                                 CONSULTATION   PRIMARY CARE PHYSICIAN:  Delaney Meigs, MD   REQUESTING PHYSICIAN:  Bethann Berkshire, MD   REASON FOR CONSULTATION:  Colitis.   HISTORY OF PRESENT ILLNESS:  Charles Pennington is a 45 year old Caucasian  male.  He developed acute abdominal pain mostly right lower quadrant 5  days ago when he awakened in the morning.  He rates the pain 8/10 pain  scale.  He describes it as a constant pain.  It feels like he was hit  with a baseball bat.  He was given a course of Cipro 500 mg b.i.d. and  Flagyl 500 mg q.i.d. and given Vicodin for pain and had been taking some  Vicodin for pain which does seem to help with his symptoms.  He was seen  in the ER.  He was found to have a white blood cell count of 10.2,  hemoglobin 40.9, hematocrit 43.1, and platelets 156.  He had sodium 133,  potassium 3.3, glucose 47, otherwise normal CMP.  He had a normal acute  abdominal film, and he was found to have fatty liver and inflammatory  changes along the mid descending colon, large calcified phlebolith  versus calcified diverticulum present anterior to the rectum, mesenteric  vascular appeared normal.  He has had some anorexia.  He denies any  NSAID use other than his daily meloxicam.  His weight had remained  stable.  He denies any fatigue.  He denies any antibiotics or recent  travel.  He does have city water.  He does note that he drinks a lot of  milk and feels that this could be part of the problem.   PAST MEDICAL AND SURGICAL HISTORY:  He had a CVA as it is described, not  quite sure if this is embolic or from AVM, diabetes mellitus,  hypertension, hypercholesterolemia,  hypothyroidism, asthma, right hand  surgery.   CURRENT MEDICATIONS:  1. Flagyl 500 mg q.i.d.  2. Cipro 500 mg b.i.d.  3. Paxil 40 mg daily.  4. Levothyroxine 100 mg daily.  5. Meloxicam 7.5 mg b.i.d.  6. Crestor 20 mg daily.  7. Hydrocodone p.r.n.  8. ProAir HFA daily.  9. Lorazepam 0.5 mg b.i.d.  10.Metformin 1 g b.i.d.  11.Glipizide 10 mg daily.  12.Omeprazole 20 mg daily.  13.Zolpidem once daily.  14.Amlodipine once daily.  15.Lisinopril/hydrochlorothiazide 20 mg daily.  16.Aspirin 81 mg daily   ALLERGIES:  No known drug allergies.   FAMILY HISTORY:  There is no known family history of colorectal  carcinoma or other chronic GI problems.  Father deceased at 33 with  hypertension.  Mother alive with history of diabetes mellitus.  He has 2  healthy siblings.   SOCIAL HISTORY:  Charles Pennington has never been  married.  He has 3 healthy  children, ages 49, 35, and 53.  He is a disabled Personnel officer.  He has a  25 pack-year history of tobacco use.  He denies any drug use.  He  consumes a couple of beers per month.   REVIEW OF SYSTEMS:  See HPI, otherwise negative.   PHYSICAL EXAMINATION:  VITAL SIGNS:  Weight 268 pounds, height 58  inches, temp 97.8, blood pressure 120/88, pulse 72.  GENERAL:  He is an obese Caucasian male who is alert, pleasant,  cooperative in no acute distress.  HEENT:  Sclerae clear, nonicteric.  Conjunctivae pick.  Oropharynx pink  and moist without lesions.  NECK:  Supple without mass or thyromegaly.  CHEST:  Heart regular rate.  Normal S1 and S2.  No murmurs, clicks,  rubs, or gallops.  LUNGS:  He does have a mild expiratory wheeze, but is in no acute  distress bilaterally.  ABDOMEN:  Positive bowel sounds x4.  No bruits auscultated.  Soft and  nondistended.  He has mild tenderness to the entire abdomen on deep  palpation.  There is no rebound, tenderness, or guarding.  No  hepatosplenomegaly or mass.  Exam is limited given the patient's body   habitus.  EXTREMITIES:  Without edema.  He does have clubbing.   IMPRESSION:  Charles Pennington is a 45 year old Caucasian male with an acute  episode of colitis.  He has been started on antibiotics at this time.  He has seen minimal improvement, continues to have around 6 loose stools  per day.  He continues to have abdominal pain.  He is going to require  further evaluation to rule out inflammatory bowel disease, infection, or  less likely ischemia.   PLAN:  1. Colonoscopy with Dr. Cira Servant in the near future.  Discussed procedure      including the risks and benefits, which include but not limited to      bleeding, infection, perforation, drug reaction.  He agrees to the      plan and consent will be obtained, and colonoscopy will be done as      soon as possible.  2. Vicodin 5/500 mg one p.o. q.4-6 h. p.r.n. severe pain #25 with no      refills.  3. Continue to drink plenty of liquids.  4. If he has any further problems in the interim, he is going to go to      the emergency room or call us.  mood.   Thank you Dr. Estell Harpin for allowing [place] to participate in the care of Mr.  Pennington.      Lorenza Burton, N.P.      Kassie Mends, M.D.  Electronically Signed    KJ/MEDQ  D:  01/05/2008  T:  01/06/2008  Job:  [id number]   cc:   Delaney Meigs, M.D.  Fax: [phone]

## 2010-06-30 NOTE — Op Note (Signed)
NAMEODEAN, Charles NO.:  [id number]   MEDICAL RECORD NO.:  [id number]          PATIENT TYPE:  AMB   LOCATION:  DAY                           FACILITY:  [facility]   PHYSICIAN:  Kassie Mends, M.D.      DATE OF BIRTH:  [birth year]   DATE OF PROCEDURE:  DATE OF DISCHARGE:                                PROCEDURE NOTE   REFERRING PHYSICIAN:  Bethann Berkshire, MD   PRIMARY CARE PHYSICIAN:  Delaney Meigs, MD   PROCEDURE:  Ileocolonoscopy with cold forceps biopsy.   INDICATION FOR EXAM:  Charles Pennington is a 45 year old male who presented  with a sudden onset of abdominal pain and diarrhea.  He has been on  Cipro and Flagyl since January 01, 2008, and is having 6 loose stools a  day.  He denies any rectal bleeding.   FINDINGS:  1. Patchy erythema with erosions and ulcerations seen in the proximal      ascending colon as well as the cecum.  Biopsies obtained via cold      forceps to evaluate for inflammatory bowel disease or malignancy.  2. Normal terminal ileum, approximately 5 cm visualized.  No colon      polyps, masses, diverticula, or AVMs seen.  3. Normal retroflexed view of the rectum.   DIAGNOSIS:  Colitis, likely resolving from an infectious etiology.  The  differential diagnosis still includes inflammatory bowel disease or  malignancy.   RECOMMENDATIONS:  1. Screening colonoscopy in 10 years with propofol.  2. Will call Charles Pennington with the results of his biopsies.  3. Follow up with Lorenza Burton in 6 weeks regarding his colitis.  4. He should complete his antibiotic therapy.  He should stop aspirin      and anti-inflammatory drugs for 30 days.  5. He should follow a lactose-free, high-fiber diet.  He was given a      handout on a lactose-free, high-fiber diet.   MEDICATIONS:  1. Demerol 200 mg.  2. Versed 10 mg.  3. Phenergan 25 mg.   PROCEDURE TECHNIQUE:  Physical exam was performed.  Informed consent was  obtained from the patient after explaining  the benefits, risks, and  alternatives to the procedure.  The patient was connected to the monitor  and placed in the left lateral position.  Continuous oxygen was provided  by nasal cannula.  IV medicine administered through an indwelling  cannula.  After administration of sedation and rectal exam, the  patient's rectum was intubated, and the scope advanced under direct  visualization to the distal terminal ileum.  The scope was removed  slowly by  carefully examining the color, texture, anatomy, and integrity of the  mucosa on the way out.  The patient was recovered in endoscopy and  discharged home in satisfactory condition.   PATH:  Colitis, likley secondary to infection.      Kassie Mends, M.D.  Electronically Signed     SM/MEDQ  D:  01/08/2008  T:  01/09/2008  Job:  [id number]   cc:   Delaney Meigs, M.D.  Fax: [phone]

## 2010-11-18 LAB — COMPREHENSIVE METABOLIC PANEL
ALT: 51
CO2: 28
Calcium: 8.7
Creatinine, Ser: 0.94
GFR calc Af Amer: >60
GFR calc non Af Amer: >60
Glucose, Bld: 157 — ABNORMAL HIGH
Sodium: 133 — ABNORMAL LOW
Total Protein: 6.5

## 2010-11-18 LAB — DIFFERENTIAL
Eosinophils Absolute: 0.3
Lymphocytes Relative: 27
Lymphs Abs: 2.8
Monocytes Relative: 6
Neutrophils Relative %: 64

## 2010-11-18 LAB — URINALYSIS, ROUTINE W REFLEX MICROSCOPIC
Glucose, UA: NEGATIVE
Hgb urine dipstick: NEGATIVE
Protein, ur: NEGATIVE
Specific Gravity, Urine: 1.015
pH: 6.5

## 2010-11-18 LAB — GLUCOSE, CAPILLARY: Glucose-Capillary: 295 — ABNORMAL HIGH

## 2010-11-18 LAB — LIPASE, BLOOD: Lipase: 17

## 2010-11-18 LAB — CBC
Hemoglobin: 14.9
MCHC: 34.5
MCV: 97
RDW: 12.7

## 2010-12-31 ENCOUNTER — Other Ambulatory Visit: Payer: Self-pay | Admitting: Orthopedic Surgery

## 2010-12-31 DIAGNOSIS — M48061 Spinal stenosis, lumbar region without neurogenic claudication: Secondary | ICD-10-CM

## 2011-01-01 ENCOUNTER — Ambulatory Visit
Admission: RE | Admit: 2011-01-01 | Discharge: 2011-01-01 | Disposition: A | Discharge status: Home / Self Care | Payer: Medicare Other | Source: Ambulatory Visit | Attending: Orthopedic Surgery | Admitting: Orthopedic Surgery

## 2011-01-01 DIAGNOSIS — M48061 Spinal stenosis, lumbar region without neurogenic claudication: Secondary | ICD-10-CM

## 2011-02-16 HISTORY — PX: BACK SURGERY: SHX140

## 2011-02-22 DIAGNOSIS — K219 Gastro-esophageal reflux disease without esophagitis: Secondary | ICD-10-CM | POA: Diagnosis not present

## 2011-02-22 DIAGNOSIS — E7800 Pure hypercholesterolemia, unspecified: Secondary | ICD-10-CM | POA: Diagnosis not present

## 2011-02-22 DIAGNOSIS — F17200 Nicotine dependence, unspecified, uncomplicated: Secondary | ICD-10-CM | POA: Diagnosis not present

## 2011-02-22 DIAGNOSIS — I10 Essential (primary) hypertension: Secondary | ICD-10-CM | POA: Diagnosis not present

## 2011-03-09 DIAGNOSIS — M48061 Spinal stenosis, lumbar region without neurogenic claudication: Secondary | ICD-10-CM | POA: Diagnosis not present

## 2011-04-02 DIAGNOSIS — J209 Acute bronchitis, unspecified: Secondary | ICD-10-CM | POA: Diagnosis not present

## 2011-04-02 DIAGNOSIS — R05 Cough: Secondary | ICD-10-CM | POA: Diagnosis not present

## 2011-05-24 DIAGNOSIS — M48061 Spinal stenosis, lumbar region without neurogenic claudication: Secondary | ICD-10-CM | POA: Diagnosis not present

## 2011-06-22 DIAGNOSIS — M48061 Spinal stenosis, lumbar region without neurogenic claudication: Secondary | ICD-10-CM | POA: Diagnosis not present

## 2011-06-23 ENCOUNTER — Encounter: Payer: Self-pay | Admitting: Physical Medicine & Rehabilitation

## 2011-07-05 ENCOUNTER — Encounter: Payer: Medicare Other | Attending: Physical Medicine & Rehabilitation

## 2011-07-05 ENCOUNTER — Encounter: Payer: Self-pay | Admitting: Physical Medicine & Rehabilitation

## 2011-07-05 ENCOUNTER — Ambulatory Visit (HOSPITAL_BASED_OUTPATIENT_CLINIC_OR_DEPARTMENT_OTHER): Payer: Medicare Other | Admitting: Physical Medicine & Rehabilitation

## 2011-07-05 VITALS — BP 157/102 | HR 83 | Resp 16 | Ht 67.0 in | Wt 266.0 lb

## 2011-07-05 DIAGNOSIS — M961 Postlaminectomy syndrome, not elsewhere classified: Secondary | ICD-10-CM | POA: Insufficient documentation

## 2011-07-05 DIAGNOSIS — M25569 Pain in unspecified knee: Secondary | ICD-10-CM | POA: Diagnosis not present

## 2011-07-05 DIAGNOSIS — I69993 Ataxia following unspecified cerebrovascular disease: Secondary | ICD-10-CM | POA: Insufficient documentation

## 2011-07-05 DIAGNOSIS — G8928 Other chronic postprocedural pain: Secondary | ICD-10-CM | POA: Diagnosis not present

## 2011-07-05 NOTE — Patient Instructions (Signed)
Your next visit will be for an injection on the right side. We will numb up the lower back joints and see how much your pain goes away. We will check a urine drug screen today. We will not prescribe any medications that are narcotic until we get the results. If you run out a medicine before I get the results you will need to call Dr. Shon Baton for another week supply

## 2011-07-05 NOTE — Progress Notes (Signed)
Subjective:    Patient ID: Charles Pennington, male    DOB: 1965-10-16, 46 y.o.   MRN: [id number]  HPI  Chronic back pain. Had a lumbar fusion in January of 2012 at theL3-L4 and decompression at the L4-L5 level by Dr. Shon Baton. Postoperative x-rays looked good. Last visit with Dr. Shon Baton was in April 2013  Patient has been on disability since 2003. He had a stroke which affected the posterior circulation and cause some chronic balance problems. Has been on Percocet in the past and 2 months ago was switched to hydrocodone. Pain levels are listed below. Opioid risk total score of 3. Right leg pain to the calf. Pain Inventory Average Pain 8 Pain Right Now 8 My pain is sharp and stabbing  In the last 24 hours, has pain interfered with the following? General activity 3 Relation with others 3 Enjoyment of life 3 What TIME of day is your pain at its worst? All Day Sleep (in general) Poor  Pain is worse with: walking, bending, sitting, inactivity and standing Pain improves with: Medication did help when taking it Relief from Meds: Hasn't had any medication  Mobility use a cane use a walker ability to climb steps?  no do you drive?  no  Function disabled: date disabled   Neuro/Psych No problems in this area  Prior Studies x-rays CT/MRI  Physicians involved in your care Primary care Dr. Marjory Lies  Review of Systems  Constitutional: Negative.   HENT: Negative.   Eyes: Negative.   Respiratory: Negative.   Cardiovascular: Negative.   Gastrointestinal: Negative.   Genitourinary: Negative.   Musculoskeletal: Positive for back pain.  Skin: Negative.   Neurological: Negative.   Hematological: Negative.   Psychiatric/Behavioral: Negative.        Objective:   Physical Exam  Constitutional: He is oriented to person, place, and time.  Musculoskeletal:       Lumbar back: He exhibits decreased range of motion, tenderness and pain. He exhibits no bony tenderness, no  swelling, no edema, no deformity and no spasm.       Back:       Negative straight leg raising test.  Neurological: He is alert and oriented to person, place, and time. He has normal strength. No cranial nerve deficit or sensory deficit. Coordination and gait abnormal.  Reflex Scores:      Tricep reflexes are 2+ on the right side and 2+ on the left side.      Bicep reflexes are 2+ on the right side and 2+ on the left side.      Brachioradialis reflexes are 2+ on the right side and 2+ on the left side.      Patellar reflexes are 1+ on the right side and 2+ on the left side.      Achilles reflexes are 2+ on the right side and 2+ on the left side.      Romberg is positive   No Acute distress Mood and affect are appropriate     Assessment & Plan:  1. Lumbar postlaminectomy syndrome with chronic postoperative pain. He is status post L3-L4 fusion but has pain below the fusion. He may have a lumbar facet syndrome at L5 S1. He may also have a sacroiliac disorder. We will start out with medial branch blocks. If this is not helpful then I would go into a sacroiliac injection. In terms of narcotic analgesics we'll try to keep these to a minimum given his history of stroke and balance disorder.  He has tolerated 3 times a day hydrocodone and oxycodone. His mother manages his medications. Check UDS today ORT is 3- Low Risk  2. Right knee pain I believe this is a chronic radiculitis.  consider L3 selective nerve root blockand possible gabapentin, may need EMG

## 2011-07-20 ENCOUNTER — Telehealth: Payer: Self-pay | Admitting: Physical Medicine & Rehabilitation

## 2011-07-20 NOTE — Telephone Encounter (Signed)
We didn't collect UDS on pt because he had been out of Hydrocodone for a while before his appointment. Can we refill medication? Thanks.

## 2011-07-20 NOTE — Telephone Encounter (Signed)
Patient needs meds before appoint here on 15th.  Dr Shon Baton referred, but doesn't know what is going on.

## 2011-07-20 NOTE — Telephone Encounter (Signed)
Call in hydrocodone 5 mg by mouth 3 times per day #90 no refills UDS next visit

## 2011-07-22 ENCOUNTER — Telehealth: Payer: Self-pay | Admitting: *Deleted

## 2011-07-22 ENCOUNTER — Other Ambulatory Visit: Payer: Self-pay | Admitting: *Deleted

## 2011-07-22 MED ORDER — HYDROCODONE-ACETAMINOPHEN 7.5-325 MG PO TABS: 1.0000 | ORAL_TABLET | Freq: Three times a day (TID) | ORAL | Status: DC

## 2011-07-22 NOTE — Telephone Encounter (Signed)
Error

## 2011-07-29 ENCOUNTER — Encounter: Payer: Self-pay | Admitting: Physical Medicine & Rehabilitation

## 2011-07-29 ENCOUNTER — Encounter: Payer: Medicare Other | Attending: Physical Medicine & Rehabilitation

## 2011-07-29 ENCOUNTER — Ambulatory Visit (HOSPITAL_BASED_OUTPATIENT_CLINIC_OR_DEPARTMENT_OTHER): Payer: Medicare Other | Admitting: Physical Medicine & Rehabilitation

## 2011-07-29 VITALS — BP 117/87 | HR 96 | Resp 16 | Ht 67.0 in | Wt 255.2 lb

## 2011-07-29 DIAGNOSIS — M961 Postlaminectomy syndrome, not elsewhere classified: Secondary | ICD-10-CM | POA: Insufficient documentation

## 2011-07-29 DIAGNOSIS — M25569 Pain in unspecified knee: Secondary | ICD-10-CM | POA: Diagnosis not present

## 2011-07-29 DIAGNOSIS — G8928 Other chronic postprocedural pain: Secondary | ICD-10-CM | POA: Diagnosis not present

## 2011-07-29 MED ORDER — TRAMADOL HCL 50 MG PO TABS: 50.0000 mg | ORAL_TABLET | Freq: Four times a day (QID) | ORAL | Status: DC | PRN

## 2011-07-29 NOTE — Patient Instructions (Signed)
Please monitor your pain levels over the next couple days. If you get at least a 50% pain relief. This tells [place] that the joints below the fusion are causing much of your pain.if your pain is relieved comes back again, we will repeat this injection. If it never was helpful. We will do a different injection called a sacroiliac injection

## 2011-07-29 NOTE — Progress Notes (Signed)
  PROCEDURE RECORD [facility]   Name: KORION CUEVAS DOB:February 24, 1965 MRN: [id number]  Date:07/29/2011  Physician: Claudette Laws, MD    Nurse/CMA: Marya Amsler CMA  Allergies:  Allergies  Allergen Reactions  . Hydrocodone-Acetaminophen Itching    Nausea and vomiting as well    Consent Signed: yes  Is patient diabetic? yes  CBG today? Did not check, norm around 117  Pregnant: no LMP: No LMP for male patient. (age 45-55)  Anticoagulants: no Anti-inflammatory: no Antibiotics: no  Procedure: Medial Branch Block Position: Prone Start Time: 10:18am End Time: 10:25am Fluoro Time: 16 seconds  RN/CMA Khyla Mccumbers RN Caroll CMA    Time 9:29 10:26am    BP 117/87 146/85    Pulse 96 91    Respirations 16 16    O2 Sat 96% 96%    S/S 6 6    Pain Level 10/10 7/10     D/C home with mother, patient A & O X 3, D/C instructions reviewed, and sits independently.

## 2011-07-29 NOTE — Addendum Note (Signed)
Addended by: Erick Colace on: 07/29/2011 10:33 AM   Modules accepted: Orders

## 2011-07-29 NOTE — Progress Notes (Signed)
Right lumbar  L4 medial branch blocks and L5 dorsal ramus injection under fluoroscopic guidance  Indication: Right Lumbar pain which is not relieved by medication management or other conservative care and interfering with self-care and mobility.  Informed consent was obtained after describing risks and benefits of the procedure with the patient, this includes bleeding, bruising, infection, paralysis and medication side effects. The patient wishes to proceed and has given written consent. The patient was placed in a prone position. The lumbar area was marked and prepped with Betadine. One ML of 1% lidocaine was injected into each of 2 areas into the skin and subcutaneous tissue. Then a 22-gauge 3.5 spinal needle was inserted targeting the junction of the Right S1 superior articular process and sacral ala junction. Needle was advanced under fluoroscopic guidance. Bone contact was made. Omnipaque 180 was injected x0.5 mL demonstrating no intravascular uptake. Then a solution containing one ML of 4 mg per mL dexamethasone and 3 mL of 2% MPF lidocaine was injected x0.5 mL. Then the Right L5 superior articular process in transverse process junction was targeted. Bone contact was made. Omnipaque 180 was injected x0.5 mL demonstrating no intravascular uptake. Then a solution containing one ML of 4 mg per mL dexamethasone and 3 mL of 2% MPF lidocaine was injected x0.5 mL.  Patient tolerated procedure well. Post procedure instructions were given. Please refer to post procedure form.

## 2011-08-02 ENCOUNTER — Telehealth: Payer: Self-pay | Admitting: Physical Medicine & Rehabilitation

## 2011-08-02 NOTE — Telephone Encounter (Signed)
Had shots in back on Thursday.  Hurting real bad.  Can Dr call in something?

## 2011-08-02 NOTE — Telephone Encounter (Signed)
Please advise 

## 2011-08-03 MED ORDER — CYCLOBENZAPRINE HCL 5 MG PO TABS: 5.0000 mg | ORAL_TABLET | Freq: Three times a day (TID) | ORAL | Status: AC | PRN

## 2011-08-03 NOTE — Telephone Encounter (Signed)
Call in Flexeril 5mg  po TID x 7 days #21

## 2011-08-03 NOTE — Telephone Encounter (Signed)
Rx has been sent in, pt aware. 

## 2011-08-05 ENCOUNTER — Telehealth: Payer: Self-pay | Admitting: Physical Medicine & Rehabilitation

## 2011-08-05 NOTE — Telephone Encounter (Signed)
Returning call.

## 2011-08-06 ENCOUNTER — Telehealth: Payer: Self-pay | Admitting: Physical Medicine & Rehabilitation

## 2011-08-06 NOTE — Telephone Encounter (Signed)
Can add an anti inflammatory, like Mobic if he tolerates it, and does not have ulcers, or is taking one already

## 2011-08-06 NOTE — Telephone Encounter (Signed)
Pt has been given Hydrocodone and Flexeril. Any suggestions?

## 2011-08-06 NOTE — Telephone Encounter (Signed)
Patient needs something for pain °

## 2011-08-09 ENCOUNTER — Telehealth: Payer: Self-pay | Admitting: *Deleted

## 2011-08-09 MED ORDER — MELOXICAM 15 MG PO TABS: 15.0000 mg | ORAL_TABLET | Freq: Every day | ORAL | Status: AC

## 2011-08-09 NOTE — Telephone Encounter (Signed)
Per Clydie Braun, Mobic 15mg  1 Q Daily. Rx will be sent in. Pt aware.

## 2011-08-09 NOTE — Telephone Encounter (Signed)
Flexeril isn't working. Vicodin is making stomach upset but he would rather have an upset stomach than be in pain. Please call something in for pain.

## 2011-08-09 NOTE — Telephone Encounter (Signed)
Pt was in a few weeks ago and a spinal injection. He called back a few days later stating he needed something for pain and Dr. Wynn Banker gave him Flexeril. Was given Hydrocodone #90 on 07/22/11. Please advise.

## 2011-08-10 ENCOUNTER — Ambulatory Visit (HOSPITAL_BASED_OUTPATIENT_CLINIC_OR_DEPARTMENT_OTHER): Payer: Medicare Other | Admitting: Physical Medicine & Rehabilitation

## 2011-08-10 ENCOUNTER — Encounter: Payer: Self-pay | Admitting: Physical Medicine & Rehabilitation

## 2011-08-10 VITALS — BP 151/93 | HR 96 | Resp 16 | Ht 67.0 in | Wt 259.0 lb

## 2011-08-10 DIAGNOSIS — M533 Sacrococcygeal disorders, not elsewhere classified: Secondary | ICD-10-CM | POA: Diagnosis not present

## 2011-08-10 DIAGNOSIS — M961 Postlaminectomy syndrome, not elsewhere classified: Secondary | ICD-10-CM | POA: Diagnosis not present

## 2011-08-10 DIAGNOSIS — M25569 Pain in unspecified knee: Secondary | ICD-10-CM | POA: Diagnosis not present

## 2011-08-10 DIAGNOSIS — G8928 Other chronic postprocedural pain: Secondary | ICD-10-CM | POA: Diagnosis not present

## 2011-08-10 NOTE — Patient Instructions (Signed)
I will send you for flexion and extension views of your lumbar to see if there's any movement when you bend in your lumbar spine. I will set to up for a right sacroiliac injection under x-ray guidance

## 2011-08-10 NOTE — Progress Notes (Addendum)
Subjective:    Patient ID: Charles Pennington, male    DOB: [birth year], 46 y.o.   MRN: [id number]  HPI Patient indicates that he had a funny sensation after his lumbar medial branch blocks performed 07/29/2011 for a day or 2 and then his pain seemed to worsen again. He is not clear that the injection had any positive effect at all. He complains of a clicking sensation and increased pain with movement. He has not had any falls or other trauma. He has not had any new medical problems. He has trialed hydrocodone in the past as well as nonsteroidal anti-inflammatory as well as muscle relaxers all of which have not been very effective.hydrocodone caused nausea and vomiting as well as itching. He reports having had good relief in the past with oxycodone Pain Inventory Average Pain 10 Pain Right Now 10 My pain is constant, sharp and stabbing  In the last 24 hours, has pain interfered with the following? General activity 10 Relation with others 10 Enjoyment of life 10 What TIME of day is your pain at its worst? All Day Sleep (in general) Poor  Pain is worse with: unsure Pain improves with: rest Relief from Meds: 0  Mobility use a cane use a walker do you drive?  no  Function disabled: date disabled   Neuro/Psych weakness numbness  Prior Studies Any changes since last visit?  no  Physicians involved in your care Any changes since last visit?  no   Family History  Problem Relation Age of Onset  . Hypertension Father   . Suicidality Father   . Diabetes Father   . Diabetes Mother    History   Social History  . Marital Status: Single    Spouse Name: N/A    Number of Children: N/A  . Years of Education: N/A   Occupational History  . disabled electrician    Social History Main Topics  . Smoking status: Current Everyday Smoker -- 1.0 packs/day    Types: Cigarettes  . Smokeless tobacco: Never Used  . Alcohol Use: Yes     occasional per pt  . Drug Use: None  .  Sexually Active: None   Other Topics Concern  . None   Social History Narrative  . None   Past Surgical History  Procedure Date  . Wrist surgery    Past Medical History  Diagnosis Date  . Diabetes mellitus   . Obesity   . Hypertension   . Hypothyroidism   . Vertigo   . Low back pain   . SOB (shortness of breath)   . Asthma   . Hypercholesteremia    BP 151/93  Pulse 96  Resp 16  Ht 5\' 7"  (1.702 m)  Wt 259 lb (117.482 kg)  BMI 40.57 kg/m2  SpO2 98%      Review of Systems  Constitutional: Negative.   HENT: Negative.   Eyes: Negative.   Respiratory: Negative.   Cardiovascular: Negative.   Gastrointestinal: Negative.   Genitourinary: Negative.   Musculoskeletal: Positive for back pain and gait problem.  Skin: Negative.   Neurological: Negative.   Hematological: Negative.   Psychiatric/Behavioral: Negative.        Objective:   Physical Exam  Constitutional: He is oriented to person, place, and time. He appears well-developed.       obese  Musculoskeletal:       Right hip: He exhibits decreased range of motion.       Lumbar back: He exhibits decreased range  of motion, tenderness, pain and spasm. He exhibits no deformity.       Lumbar surgical incision nontender no erythema well-healed no hypersensitivity  Fabers maneuver causes pain in the sacroiliac area  Neurological: He is alert and oriented to person, place, and time. He has normal reflexes. He exhibits normal muscle tone. Coordination normal.  Psychiatric: He has a normal mood and affect.          Assessment & Plan:  #1. Lumbar postlaminectomy syndrome. He has chronic postoperative pain which he rates as severe and chronic. He did not respond to medial branch blocks despite the radiologic abnormality of facet arthropathy and pain with extension. He does have pain in the sacroiliac area and certainly is at risk for this problem given his prior history of fusion. Will schedule for sacroiliac  injection We will check urine drug screen prior to prescribing any opiates.his opioid risk total score is low  Positive urine drug screen for cocaine after I had  prescribed hydrocodone. Will discharge from clinic.

## 2011-08-12 ENCOUNTER — Telehealth: Payer: Self-pay | Admitting: *Deleted

## 2011-08-12 NOTE — Telephone Encounter (Signed)
Results are not back yet, he is aware that we will contact him as soon as they do.

## 2011-08-12 NOTE — Telephone Encounter (Signed)
UDS results back yet? Needs something for pain.

## 2011-08-13 ENCOUNTER — Telehealth: Payer: Self-pay | Admitting: *Deleted

## 2011-08-13 NOTE — Telephone Encounter (Signed)
UDS results back? Has appointment on 08/17/11 and 09/14/11, does he need to come to both? Please call.

## 2011-08-13 NOTE — Telephone Encounter (Signed)
Pt aware of UDS results.  Please send discharge letter.

## 2011-08-13 NOTE — Telephone Encounter (Signed)
Lm for pt to call office regarding UDS results.  UDS was positive for cocaine.

## 2011-08-17 ENCOUNTER — Ambulatory Visit: Payer: Medicare Other | Admitting: Physical Medicine & Rehabilitation

## 2011-08-20 ENCOUNTER — Encounter: Payer: Self-pay | Admitting: Physical Medicine & Rehabilitation

## 2011-09-02 DIAGNOSIS — F411 Generalized anxiety disorder: Secondary | ICD-10-CM | POA: Diagnosis not present

## 2011-09-02 DIAGNOSIS — Z794 Long term (current) use of insulin: Secondary | ICD-10-CM | POA: Diagnosis not present

## 2011-09-02 DIAGNOSIS — I10 Essential (primary) hypertension: Secondary | ICD-10-CM | POA: Diagnosis not present

## 2011-09-02 DIAGNOSIS — E039 Hypothyroidism, unspecified: Secondary | ICD-10-CM | POA: Diagnosis not present

## 2011-09-02 DIAGNOSIS — E7800 Pure hypercholesterolemia, unspecified: Secondary | ICD-10-CM | POA: Diagnosis not present

## 2011-09-02 DIAGNOSIS — M79609 Pain in unspecified limb: Secondary | ICD-10-CM | POA: Diagnosis not present

## 2011-09-02 DIAGNOSIS — F17200 Nicotine dependence, unspecified, uncomplicated: Secondary | ICD-10-CM | POA: Diagnosis not present

## 2011-09-09 DIAGNOSIS — M21619 Bunion of unspecified foot: Secondary | ICD-10-CM | POA: Diagnosis not present

## 2011-09-09 DIAGNOSIS — M109 Gout, unspecified: Secondary | ICD-10-CM | POA: Diagnosis not present

## 2011-09-09 DIAGNOSIS — M779 Enthesopathy, unspecified: Secondary | ICD-10-CM | POA: Diagnosis not present

## 2011-09-14 ENCOUNTER — Ambulatory Visit: Payer: Medicare Other | Admitting: Physical Medicine & Rehabilitation

## 2011-11-08 DIAGNOSIS — M109 Gout, unspecified: Secondary | ICD-10-CM | POA: Diagnosis not present

## 2011-11-09 DIAGNOSIS — L600 Ingrowing nail: Secondary | ICD-10-CM | POA: Diagnosis not present

## 2011-11-23 DIAGNOSIS — G5790 Unspecified mononeuropathy of unspecified lower limb: Secondary | ICD-10-CM | POA: Diagnosis not present

## 2011-12-07 DIAGNOSIS — M216X9 Other acquired deformities of unspecified foot: Secondary | ICD-10-CM | POA: Diagnosis not present

## 2011-12-07 DIAGNOSIS — R262 Difficulty in walking, not elsewhere classified: Secondary | ICD-10-CM | POA: Diagnosis not present

## 2011-12-07 DIAGNOSIS — G5760 Lesion of plantar nerve, unspecified lower limb: Secondary | ICD-10-CM | POA: Diagnosis not present

## 2011-12-08 DIAGNOSIS — I10 Essential (primary) hypertension: Secondary | ICD-10-CM | POA: Diagnosis not present

## 2011-12-10 DIAGNOSIS — M48061 Spinal stenosis, lumbar region without neurogenic claudication: Secondary | ICD-10-CM | POA: Diagnosis not present

## 2011-12-23 DIAGNOSIS — G5760 Lesion of plantar nerve, unspecified lower limb: Secondary | ICD-10-CM | POA: Diagnosis not present

## 2012-01-07 DIAGNOSIS — M48061 Spinal stenosis, lumbar region without neurogenic claudication: Secondary | ICD-10-CM | POA: Diagnosis not present

## 2012-03-02 DIAGNOSIS — M216X9 Other acquired deformities of unspecified foot: Secondary | ICD-10-CM | POA: Diagnosis not present

## 2012-03-02 DIAGNOSIS — M21619 Bunion of unspecified foot: Secondary | ICD-10-CM | POA: Diagnosis not present

## 2012-03-16 DIAGNOSIS — Z79899 Other long term (current) drug therapy: Secondary | ICD-10-CM | POA: Diagnosis not present

## 2012-03-16 DIAGNOSIS — J45909 Unspecified asthma, uncomplicated: Secondary | ICD-10-CM | POA: Diagnosis not present

## 2012-03-16 DIAGNOSIS — F17200 Nicotine dependence, unspecified, uncomplicated: Secondary | ICD-10-CM | POA: Diagnosis not present

## 2012-03-16 DIAGNOSIS — E669 Obesity, unspecified: Secondary | ICD-10-CM | POA: Diagnosis not present

## 2012-03-16 DIAGNOSIS — F411 Generalized anxiety disorder: Secondary | ICD-10-CM | POA: Diagnosis not present

## 2012-03-16 DIAGNOSIS — K219 Gastro-esophageal reflux disease without esophagitis: Secondary | ICD-10-CM | POA: Diagnosis not present

## 2012-03-16 DIAGNOSIS — Z8673 Personal history of transient ischemic attack (TIA), and cerebral infarction without residual deficits: Secondary | ICD-10-CM | POA: Diagnosis not present

## 2012-03-16 DIAGNOSIS — G8929 Other chronic pain: Secondary | ICD-10-CM | POA: Diagnosis not present

## 2012-03-16 DIAGNOSIS — M21619 Bunion of unspecified foot: Secondary | ICD-10-CM | POA: Diagnosis not present

## 2012-03-16 DIAGNOSIS — Z794 Long term (current) use of insulin: Secondary | ICD-10-CM | POA: Diagnosis not present

## 2012-03-16 DIAGNOSIS — E119 Type 2 diabetes mellitus without complications: Secondary | ICD-10-CM | POA: Diagnosis not present

## 2012-03-16 DIAGNOSIS — Z981 Arthrodesis status: Secondary | ICD-10-CM | POA: Diagnosis not present

## 2012-03-26 DIAGNOSIS — M216X9 Other acquired deformities of unspecified foot: Secondary | ICD-10-CM | POA: Diagnosis not present

## 2012-03-28 DIAGNOSIS — M21619 Bunion of unspecified foot: Secondary | ICD-10-CM | POA: Diagnosis not present

## 2012-04-04 DIAGNOSIS — J449 Chronic obstructive pulmonary disease, unspecified: Secondary | ICD-10-CM | POA: Diagnosis not present

## 2012-04-04 DIAGNOSIS — E7800 Pure hypercholesterolemia, unspecified: Secondary | ICD-10-CM | POA: Diagnosis not present

## 2012-04-04 DIAGNOSIS — F411 Generalized anxiety disorder: Secondary | ICD-10-CM | POA: Diagnosis not present

## 2012-04-04 DIAGNOSIS — E1149 Type 2 diabetes mellitus with other diabetic neurological complication: Secondary | ICD-10-CM | POA: Diagnosis not present

## 2012-04-04 DIAGNOSIS — Z794 Long term (current) use of insulin: Secondary | ICD-10-CM | POA: Diagnosis not present

## 2012-04-04 DIAGNOSIS — E1142 Type 2 diabetes mellitus with diabetic polyneuropathy: Secondary | ICD-10-CM | POA: Diagnosis not present

## 2012-04-04 DIAGNOSIS — I10 Essential (primary) hypertension: Secondary | ICD-10-CM | POA: Diagnosis not present

## 2012-04-04 DIAGNOSIS — F17200 Nicotine dependence, unspecified, uncomplicated: Secondary | ICD-10-CM | POA: Diagnosis not present

## 2012-04-07 DIAGNOSIS — I10 Essential (primary) hypertension: Secondary | ICD-10-CM | POA: Diagnosis not present

## 2012-04-07 DIAGNOSIS — E7800 Pure hypercholesterolemia, unspecified: Secondary | ICD-10-CM | POA: Diagnosis not present

## 2012-04-07 DIAGNOSIS — E1149 Type 2 diabetes mellitus with other diabetic neurological complication: Secondary | ICD-10-CM | POA: Diagnosis not present

## 2012-04-07 DIAGNOSIS — E039 Hypothyroidism, unspecified: Secondary | ICD-10-CM | POA: Diagnosis not present

## 2012-06-01 DIAGNOSIS — M21619 Bunion of unspecified foot: Secondary | ICD-10-CM | POA: Diagnosis not present

## 2012-06-01 DIAGNOSIS — M79609 Pain in unspecified limb: Secondary | ICD-10-CM | POA: Diagnosis not present

## 2012-06-13 DIAGNOSIS — M216X9 Other acquired deformities of unspecified foot: Secondary | ICD-10-CM | POA: Diagnosis not present

## 2012-06-21 DIAGNOSIS — J069 Acute upper respiratory infection, unspecified: Secondary | ICD-10-CM | POA: Diagnosis not present

## 2012-06-22 DIAGNOSIS — Z8673 Personal history of transient ischemic attack (TIA), and cerebral infarction without residual deficits: Secondary | ICD-10-CM | POA: Diagnosis not present

## 2012-06-22 DIAGNOSIS — M21969 Unspecified acquired deformity of unspecified lower leg: Secondary | ICD-10-CM | POA: Diagnosis not present

## 2012-06-22 DIAGNOSIS — Z79899 Other long term (current) drug therapy: Secondary | ICD-10-CM | POA: Diagnosis not present

## 2012-06-22 DIAGNOSIS — Z794 Long term (current) use of insulin: Secondary | ICD-10-CM | POA: Diagnosis not present

## 2012-06-22 DIAGNOSIS — F17200 Nicotine dependence, unspecified, uncomplicated: Secondary | ICD-10-CM | POA: Diagnosis not present

## 2012-06-22 DIAGNOSIS — I519 Heart disease, unspecified: Secondary | ICD-10-CM | POA: Diagnosis not present

## 2012-06-22 DIAGNOSIS — L84 Corns and callosities: Secondary | ICD-10-CM | POA: Diagnosis not present

## 2012-06-22 DIAGNOSIS — E109 Type 1 diabetes mellitus without complications: Secondary | ICD-10-CM | POA: Diagnosis not present

## 2012-06-22 DIAGNOSIS — M21619 Bunion of unspecified foot: Secondary | ICD-10-CM | POA: Diagnosis not present

## 2012-07-04 DIAGNOSIS — M216X9 Other acquired deformities of unspecified foot: Secondary | ICD-10-CM | POA: Diagnosis not present

## 2012-07-04 DIAGNOSIS — Z09 Encounter for follow-up examination after completed treatment for conditions other than malignant neoplasm: Secondary | ICD-10-CM | POA: Diagnosis not present

## 2012-07-27 DIAGNOSIS — M216X9 Other acquired deformities of unspecified foot: Secondary | ICD-10-CM | POA: Diagnosis not present

## 2012-08-28 DIAGNOSIS — M545 Low back pain: Secondary | ICD-10-CM | POA: Diagnosis not present

## 2012-09-04 DIAGNOSIS — S20229A Contusion of unspecified back wall of thorax, initial encounter: Secondary | ICD-10-CM | POA: Diagnosis not present

## 2012-09-29 DIAGNOSIS — I10 Essential (primary) hypertension: Secondary | ICD-10-CM | POA: Diagnosis not present

## 2012-09-29 DIAGNOSIS — M533 Sacrococcygeal disorders, not elsewhere classified: Secondary | ICD-10-CM | POA: Diagnosis not present

## 2012-10-13 DIAGNOSIS — M48061 Spinal stenosis, lumbar region without neurogenic claudication: Secondary | ICD-10-CM | POA: Diagnosis not present

## 2012-11-01 DIAGNOSIS — E039 Hypothyroidism, unspecified: Secondary | ICD-10-CM | POA: Diagnosis not present

## 2012-11-01 DIAGNOSIS — F411 Generalized anxiety disorder: Secondary | ICD-10-CM | POA: Diagnosis not present

## 2012-11-01 DIAGNOSIS — E1142 Type 2 diabetes mellitus with diabetic polyneuropathy: Secondary | ICD-10-CM | POA: Diagnosis not present

## 2012-11-01 DIAGNOSIS — I10 Essential (primary) hypertension: Secondary | ICD-10-CM | POA: Diagnosis not present

## 2012-11-01 DIAGNOSIS — J449 Chronic obstructive pulmonary disease, unspecified: Secondary | ICD-10-CM | POA: Diagnosis not present

## 2012-11-01 DIAGNOSIS — K219 Gastro-esophageal reflux disease without esophagitis: Secondary | ICD-10-CM | POA: Diagnosis not present

## 2012-11-01 DIAGNOSIS — Z23 Encounter for immunization: Secondary | ICD-10-CM | POA: Diagnosis not present

## 2012-11-01 DIAGNOSIS — F17200 Nicotine dependence, unspecified, uncomplicated: Secondary | ICD-10-CM | POA: Diagnosis not present

## 2012-11-01 DIAGNOSIS — E1149 Type 2 diabetes mellitus with other diabetic neurological complication: Secondary | ICD-10-CM | POA: Diagnosis not present

## 2013-02-16 DIAGNOSIS — M48061 Spinal stenosis, lumbar region without neurogenic claudication: Secondary | ICD-10-CM | POA: Diagnosis not present

## 2013-05-30 DIAGNOSIS — IMO0002 Reserved for concepts with insufficient information to code with codable children: Secondary | ICD-10-CM | POA: Diagnosis not present

## 2013-05-30 DIAGNOSIS — S46909A Unspecified injury of unspecified muscle, fascia and tendon at shoulder and upper arm level, unspecified arm, initial encounter: Secondary | ICD-10-CM | POA: Diagnosis not present

## 2013-05-30 DIAGNOSIS — M25519 Pain in unspecified shoulder: Secondary | ICD-10-CM | POA: Diagnosis not present

## 2013-05-30 DIAGNOSIS — Z8673 Personal history of transient ischemic attack (TIA), and cerebral infarction without residual deficits: Secondary | ICD-10-CM | POA: Diagnosis not present

## 2013-07-19 ENCOUNTER — Encounter (INDEPENDENT_AMBULATORY_CARE_PROVIDER_SITE_OTHER): Payer: Self-pay

## 2013-07-19 ENCOUNTER — Encounter: Payer: Self-pay | Admitting: Family

## 2013-07-19 ENCOUNTER — Ambulatory Visit (INDEPENDENT_AMBULATORY_CARE_PROVIDER_SITE_OTHER): Payer: Medicare Other | Admitting: Family

## 2013-07-19 VITALS — BP 139/78 | HR 99 | Temp 98.6°F | Ht 67.0 in | Wt 235.0 lb

## 2013-07-19 DIAGNOSIS — E119 Type 2 diabetes mellitus without complications: Secondary | ICD-10-CM

## 2013-07-19 DIAGNOSIS — F32A Depression, unspecified: Secondary | ICD-10-CM | POA: Insufficient documentation

## 2013-07-19 DIAGNOSIS — M25519 Pain in unspecified shoulder: Secondary | ICD-10-CM

## 2013-07-19 DIAGNOSIS — E785 Hyperlipidemia, unspecified: Secondary | ICD-10-CM

## 2013-07-19 DIAGNOSIS — F411 Generalized anxiety disorder: Secondary | ICD-10-CM | POA: Insufficient documentation

## 2013-07-19 DIAGNOSIS — F3289 Other specified depressive episodes: Secondary | ICD-10-CM

## 2013-07-19 DIAGNOSIS — R062 Wheezing: Secondary | ICD-10-CM

## 2013-07-19 DIAGNOSIS — E039 Hypothyroidism, unspecified: Secondary | ICD-10-CM | POA: Diagnosis not present

## 2013-07-19 DIAGNOSIS — K219 Gastro-esophageal reflux disease without esophagitis: Secondary | ICD-10-CM

## 2013-07-19 DIAGNOSIS — M25511 Pain in right shoulder: Secondary | ICD-10-CM

## 2013-07-19 DIAGNOSIS — F329 Major depressive disorder, single episode, unspecified: Secondary | ICD-10-CM

## 2013-07-19 DIAGNOSIS — I10 Essential (primary) hypertension: Secondary | ICD-10-CM | POA: Diagnosis not present

## 2013-07-19 DIAGNOSIS — G4700 Insomnia, unspecified: Secondary | ICD-10-CM

## 2013-07-19 MED ORDER — ATORVASTATIN CALCIUM 40 MG PO TABS: 40.0000 mg | ORAL_TABLET | Freq: Every day | ORAL | Status: DC

## 2013-07-19 MED ORDER — PAROXETINE HCL 40 MG PO TABS: 40.0000 mg | ORAL_TABLET | Freq: Every day | ORAL | Status: DC

## 2013-07-19 MED ORDER — INSULIN GLARGINE 100 UNIT/ML ~~LOC~~ SOLN: 80.0000 [IU] | Freq: Every day | SUBCUTANEOUS | Status: DC

## 2013-07-19 MED ORDER — OMEPRAZOLE 20 MG PO CPDR: 20.0000 mg | DELAYED_RELEASE_CAPSULE | Freq: Every day | ORAL | Status: DC

## 2013-07-19 MED ORDER — AMLODIPINE BESYLATE 5 MG PO TABS: 5.0000 mg | ORAL_TABLET | Freq: Every day | ORAL | Status: DC

## 2013-07-19 MED ORDER — ZOLPIDEM TARTRATE 10 MG PO TABS: 10.0000 mg | ORAL_TABLET | Freq: Every evening | ORAL | Status: DC | PRN

## 2013-07-19 MED ORDER — FENOFIBRATE 160 MG PO TABS: 160.0000 mg | ORAL_TABLET | Freq: Every day | ORAL | Status: DC

## 2013-07-19 MED ORDER — ALPRAZOLAM 0.5 MG PO TABS: 0.5000 mg | ORAL_TABLET | Freq: Three times a day (TID) | ORAL | Status: DC | PRN

## 2013-07-19 MED ORDER — ALBUTEROL SULFATE HFA 108 (90 BASE) MCG/ACT IN AERS: 2.0000 | INHALATION_SPRAY | Freq: Four times a day (QID) | RESPIRATORY_TRACT | Status: DC | PRN

## 2013-07-19 MED ORDER — LEVOTHYROXINE SODIUM 112 MCG PO TABS: 112.0000 ug | ORAL_TABLET | Freq: Every day | ORAL | Status: DC

## 2013-07-19 NOTE — Patient Instructions (Signed)

## 2013-07-19 NOTE — Progress Notes (Signed)
Subjective:    Patient ID: Charles Pennington, male    DOB: 05-24-1965, 48 y.o.   MRN: [id number]  Diabetes He presents for his follow-up diabetic visit. He has type 2 diabetes mellitus. His disease course has been fluctuating. Hypoglycemia symptoms include nervousness/anxiousness. Associated symptoms include visual change. Pertinent negatives for diabetes include no blurred vision, no fatigue, no foot paresthesias and no foot ulcerations. There are no hypoglycemic complications. Symptoms are stable. Risk factors for coronary artery disease include diabetes mellitus, dyslipidemia, hypertension, male sex and tobacco exposure. Current diabetic treatment includes insulin injections and oral agent (monotherapy). He is compliant with treatment all of the time. His weight is stable. He is following a generally healthy diet. He participates in exercise three times a week. His breakfast blood glucose range is generally 130-140 mg/dl. Eye exam is not current (Pt educated on importance of yearly eye exams).  Hyperlipidemia This is a chronic problem. The current episode started more than 1 year ago. The problem is uncontrolled. Recent lipid tests were reviewed and are high. Exacerbating diseases include diabetes, hypothyroidism and obesity. Factors aggravating his hyperlipidemia include fatty foods and smoking. Pertinent negatives include no focal sensory loss, focal weakness, leg pain or shortness of breath. Treatments tried: Pt has not been on medication for over a month. The current treatment provides moderate improvement of lipids. Compliance problems include adherence to exercise and medication cost.  Risk factors for coronary artery disease include diabetes mellitus, dyslipidemia, male sex, obesity and hypertension.  Hypertension This is a chronic problem. The current episode started more than 1 year ago. The problem has been resolved since onset. The problem is controlled. Associated symptoms include anxiety.  Pertinent negatives include no blurred vision, palpitations, peripheral edema or shortness of breath. Risk factors for coronary artery disease include diabetes mellitus, dyslipidemia, male gender, obesity and smoking/tobacco exposure. Past treatments include calcium channel blockers. The current treatment provides moderate improvement. Compliance problems include medication cost.  Hypertensive end-organ damage includes CAD/MI and a thyroid problem. There is no history of kidney disease.  Thyroid Problem Presents for follow-up visit. Symptoms include anxiety, depressed mood, dry skin and visual change. Patient reports no constipation, diarrhea, fatigue, hair loss or palpitations. The symptoms have been stable. Past treatments include levothyroxine. The treatment provided moderate relief. His past medical history is significant for diabetes and hyperlipidemia.  Anxiety Presents for follow-up visit. Symptoms include depressed mood, excessive worry, insomnia, irritability and nervous/anxious behavior. Patient reports no nausea, palpitations or shortness of breath. Symptoms occur most days. The symptoms are aggravated by family issues. The quality of sleep is poor.   His past medical history is significant for depression. Past treatments include benzodiazephines and SSRIs. The treatment provided significant relief.  Gastrophageal Reflux He reports no choking, no coughing, no heartburn, no nausea or no sore throat. This is a chronic problem. The current episode started more than 1 year ago. The problem occurs rarely. The symptoms are aggravated by certain foods. Pertinent negatives include no fatigue. He has tried a PPI for the symptoms. The treatment provided significant relief.   *Pt has been out of all medication for over the last month. Pt has hx of stroke 17 years ago and has right side weakness.   Review of Systems  Constitutional: Positive for irritability. Negative for fatigue.  HENT: Negative for  sore throat.   Eyes: Negative for blurred vision.  Respiratory: Negative for cough, choking and shortness of breath.   Cardiovascular: Negative for palpitations.  Gastrointestinal: Negative  for heartburn, nausea, diarrhea and constipation.  Genitourinary: Negative.   Musculoskeletal: Negative.   Neurological: Negative for focal weakness.  Psychiatric/Behavioral: The patient is nervous/anxious and has insomnia.   All other systems reviewed and are negative.      Objective:   Physical Exam  Vitals reviewed. Constitutional: He is oriented to person, place, and time. He appears well-developed and well-nourished. No distress.  HENT:  Head: Normocephalic.  Right Ear: External ear normal.  Left Ear: External ear normal.  Mouth/Throat: Oropharynx is clear and moist.  Eyes: Pupils are equal, round, and reactive to light. Right eye exhibits no discharge. Left eye exhibits no discharge.  Neck: Normal range of motion. Neck supple. No thyromegaly present.  Cardiovascular: Normal rate, regular rhythm, normal heart sounds and intact distal pulses.   No murmur heard. Pulmonary/Chest: Effort normal. No respiratory distress. He has wheezes.  Diminished breath sounds  Abdominal: Soft. Bowel sounds are normal. He exhibits no distension. There is no tenderness.  Musculoskeletal: Normal range of motion. He exhibits no edema and no tenderness.  Mild right sided weakness r/t to stroke 17 years ago  Neurological: He is alert and oriented to person, place, and time. He has normal reflexes. No cranial nerve deficit.  Skin: Skin is warm and dry. No rash noted. No erythema.  Psychiatric: He has a normal mood and affect. His behavior is normal. Judgment and thought content normal.     BP 139/78  Pulse 99  Temp(Src) 98.6 F (37 C) (Oral)  Ht 5' 7"  (1.702 m)  Wt 235 lb (106.595 kg)  BMI 36.80 kg/m2      Assessment & Plan:  1. HYPERLIPIDEMIA - atorvastatin (LIPITOR) 40 MG tablet; Take 1 tablet (40 mg  total) by mouth daily.  Dispense: 30 tablet; Refill: 6 - fenofibrate 160 MG tablet; Take 1 tablet (160 mg total) by mouth daily.  Dispense: 30 tablet; Refill: 6 - Lipid panel; Future  2. HYPOTHYROIDISM - levothyroxine (SYNTHROID, LEVOTHROID) 112 MCG tablet; Take 1 tablet (112 mcg total) by mouth daily.  Dispense: 30 tablet; Refill: 6 - Thyroid Panel With TSH; Future - Vit D  25 hydroxy (rtn osteoporosis monitoring); Future  3. HYPERTENSION - amLODipine (NORVASC) 5 MG tablet; Take 1 tablet (5 mg total) by mouth daily.  Dispense: 30 tablet; Refill: 6 - CMP14+EGFR; Future  4. DM - insulin glargine (LANTUS) 100 UNIT/ML injection; Inject 0.8 mLs (80 Units total) into the skin daily.  Dispense: 10 mL; Refill: 11 - POCT glycosylated hemoglobin (Hb A1C); Future  5. GERD (gastroesophageal reflux disease) - omeprazole (PRILOSEC) 20 MG capsule; Take 1 capsule (20 mg total) by mouth daily.  Dispense: 30 capsule; Refill: 6  6. GAD (generalized anxiety disorder) - PARoxetine (PAXIL) 40 MG tablet; Take 1 tablet (40 mg total) by mouth daily.  Dispense: 30 tablet; Refill: 11 - ALPRAZolam (XANAX) 0.5 MG tablet; Take 1 tablet (0.5 mg total) by mouth 3 (three) times daily as needed for anxiety.  Dispense: 90 tablet; Refill: 0  7. Depression - PARoxetine (PAXIL) 40 MG tablet; Take 1 tablet (40 mg total) by mouth daily.  Dispense: 30 tablet; Refill: 11  8. Wheezing - albuterol (PROVENTIL HFA;VENTOLIN HFA) 108 (90 BASE) MCG/ACT inhaler; Inhale 2 puffs into the lungs every 6 (six) hours as needed for wheezing.  Dispense: 3.7 g; Refill: 6  9. Insomnia - zolpidem (AMBIEN) 10 MG tablet; Take 1 tablet (10 mg total) by mouth at bedtime as needed.  Dispense: 30 tablet; Refill: 2  10.  Right shoulder pain - Ambulatory referral to Orthopedic Surgery   Continue all meds PT to come in next week or so and get labs-He ate before visit Health Maintenance reviewed Diet and exercise encouraged RTO 3  months  Evelina Dun, FNP

## 2013-08-02 ENCOUNTER — Other Ambulatory Visit (INDEPENDENT_AMBULATORY_CARE_PROVIDER_SITE_OTHER): Payer: Medicare Other

## 2013-08-02 ENCOUNTER — Telehealth: Payer: Self-pay | Admitting: Family Medicine

## 2013-08-02 DIAGNOSIS — E119 Type 2 diabetes mellitus without complications: Secondary | ICD-10-CM | POA: Diagnosis not present

## 2013-08-02 DIAGNOSIS — E785 Hyperlipidemia, unspecified: Secondary | ICD-10-CM | POA: Diagnosis not present

## 2013-08-02 DIAGNOSIS — E039 Hypothyroidism, unspecified: Secondary | ICD-10-CM | POA: Diagnosis not present

## 2013-08-02 DIAGNOSIS — E559 Vitamin D deficiency, unspecified: Secondary | ICD-10-CM | POA: Diagnosis not present

## 2013-08-02 DIAGNOSIS — K219 Gastro-esophageal reflux disease without esophagitis: Secondary | ICD-10-CM

## 2013-08-02 DIAGNOSIS — I10 Essential (primary) hypertension: Secondary | ICD-10-CM | POA: Diagnosis not present

## 2013-08-02 LAB — POCT GLYCOSYLATED HEMOGLOBIN (HGB A1C): HEMOGLOBIN A1C: 12.1

## 2013-08-02 LAB — GLUCOSE, POCT (MANUAL RESULT ENTRY): POC GLUCOSE: 430 mg/dL — AB (ref 70–99)

## 2013-08-02 NOTE — Addendum Note (Signed)
Addended by: Tommas OlpHANDY, ASHLEY N on: 08/02/2013 08:43 AM   Modules accepted: Orders

## 2013-08-02 NOTE — Progress Notes (Signed)
Pt came in for labs only 

## 2013-08-03 ENCOUNTER — Telehealth: Payer: Self-pay | Admitting: *Deleted

## 2013-08-03 LAB — CMP14+EGFR
ALK PHOS: 108 IU/L (ref 39–117)
ALT: 41 IU/L (ref 0–44)
AST: 32 IU/L (ref 0–40)
Albumin/Globulin Ratio: 1.8 (ref 1.1–2.5)
Albumin: 4.6 g/dL (ref 3.5–5.5)
BUN / CREAT RATIO: 19 (ref 9–20)
BUN: 18 mg/dL (ref 6–24)
CALCIUM: 10.2 mg/dL (ref 8.7–10.2)
CHLORIDE: 90 mmol/L — AB (ref 97–108)
CO2: 24 mmol/L (ref 18–29)
Creatinine, Ser: 0.93 mg/dL (ref 0.76–1.27)
GFR calc Af Amer: 113 mL/min/{1.73_m2} (ref >59)
GFR calc non Af Amer: 97 mL/min/{1.73_m2} (ref >59)
GLOBULIN, TOTAL: 2.6 g/dL (ref 1.5–4.5)
Glucose: 419 mg/dL (ref 65–99)
Potassium: 5 mmol/L (ref 3.5–5.2)
SODIUM: 132 mmol/L — AB (ref 134–144)
Total Bilirubin: 0.3 mg/dL (ref 0.0–1.2)
Total Protein: 7.2 g/dL (ref 6.0–8.5)

## 2013-08-03 LAB — THYROID PANEL WITH TSH
Free Thyroxine Index: 1.6 (ref 1.2–4.9)
T3 UPTAKE RATIO: 26 % (ref 24–39)
T4, Total: 6 ug/dL (ref 4.5–12.0)
TSH: 10.74 u[IU]/mL — ABNORMAL HIGH (ref 0.450–4.500)

## 2013-08-03 LAB — LIPID PANEL
CHOL/HDL RATIO: 3.4 ratio (ref 0.0–5.0)
CHOLESTEROL TOTAL: 146 mg/dL (ref 100–199)
HDL: 43 mg/dL (ref >39)
LDL Calculated: 74 mg/dL (ref 0–99)
Triglycerides: 146 mg/dL (ref 0–149)
VLDL Cholesterol Cal: 29 mg/dL (ref 5–40)

## 2013-08-03 LAB — VITAMIN D 25 HYDROXY (VIT D DEFICIENCY, FRACTURES): VIT D 25 HYDROXY: 24.9 ng/mL — AB (ref 30.0–100.0)

## 2013-08-03 MED ORDER — OMEPRAZOLE 20 MG PO CPDR: 20.0000 mg | DELAYED_RELEASE_CAPSULE | Freq: Every day | ORAL | Status: DC

## 2013-08-03 MED ORDER — NAPROXEN 500 MG PO TABS: 500.0000 mg | ORAL_TABLET | Freq: Two times a day (BID) | ORAL | Status: DC

## 2013-08-03 NOTE — Telephone Encounter (Signed)
Omeprazole refill sent to pharmacy. RX of naproxen sent to pharmacy. Make sure pt takes with food.

## 2013-08-03 NOTE — Telephone Encounter (Signed)
Patient aware.

## 2013-08-13 ENCOUNTER — Other Ambulatory Visit: Payer: Self-pay | Admitting: Family Medicine

## 2013-08-13 DIAGNOSIS — M542 Cervicalgia: Secondary | ICD-10-CM | POA: Diagnosis not present

## 2013-08-13 DIAGNOSIS — IMO0002 Reserved for concepts with insufficient information to code with codable children: Secondary | ICD-10-CM | POA: Diagnosis not present

## 2013-08-15 ENCOUNTER — Ambulatory Visit (INDEPENDENT_AMBULATORY_CARE_PROVIDER_SITE_OTHER): Payer: Medicare Other | Admitting: Pharmacist

## 2013-08-15 VITALS — BP 134/86 | HR 78 | Ht 67.0 in | Wt 244.0 lb

## 2013-08-15 DIAGNOSIS — E1140 Type 2 diabetes mellitus with diabetic neuropathy, unspecified: Secondary | ICD-10-CM | POA: Insufficient documentation

## 2013-08-15 DIAGNOSIS — E119 Type 2 diabetes mellitus without complications: Secondary | ICD-10-CM | POA: Diagnosis not present

## 2013-08-15 DIAGNOSIS — R413 Other amnesia: Secondary | ICD-10-CM | POA: Diagnosis not present

## 2013-08-15 DIAGNOSIS — E785 Hyperlipidemia, unspecified: Secondary | ICD-10-CM

## 2013-08-15 DIAGNOSIS — F17200 Nicotine dependence, unspecified, uncomplicated: Secondary | ICD-10-CM

## 2013-08-15 DIAGNOSIS — I10 Essential (primary) hypertension: Secondary | ICD-10-CM

## 2013-08-15 DIAGNOSIS — E1142 Type 2 diabetes mellitus with diabetic polyneuropathy: Secondary | ICD-10-CM

## 2013-08-15 MED ORDER — BLOOD GLUCOSE TEST VI STRP: ORAL_STRIP | Status: DC

## 2013-08-15 MED ORDER — INSULIN GLARGINE 100 UNIT/ML SOLOSTAR PEN: 45.0000 [IU] | PEN_INJECTOR | Freq: Two times a day (BID) | SUBCUTANEOUS | Status: DC

## 2013-08-15 MED ORDER — METFORMIN HCL ER 500 MG PO TB24: 500.0000 mg | ORAL_TABLET | Freq: Every day | ORAL | Status: DC

## 2013-08-15 NOTE — Progress Notes (Signed)
Diabetes Follow-Up Visit Chief Complaint:   Chief Complaint  Patient presents with  . Diabetes     Filed Vitals:   08/15/13 1258  BP: 134/86  Pulse: 78     HPI: patient with uncontrolled type 2 DM that was diagnosed 10 years ago. He admits to being non compliant with treatment regimen until he was seen about 1 month ago.  He has restarted Lantus but has not started metformin because he caused diarrhea.    Current Diabetes Medications:  Lantus 80 units QD (dose is suppose to be 45 unit BID)  Home BG Monitoring:  Checking 0 times a day. - not checking because needs Rx for test strips  Low fat/carbohydrate diet?  No Nicotine Abuse?  Yes - patient offered counseling but refused.  Not ready to quit yet. Medication Compliance?  No Exercise?  No Alcohol Abuse?  No  Exam Edema:  negative  Polyuria:  negative  Polydipsia:  positive Polyphagia:  negative  BMI:  Body mass index is 38.21 kg/(m^2).   Weight changes:  Increased by 9# General Appearance:  alert, oriented, no acute distress and obese Mood/Affect:  normal   Lab Results  Component Value Date   HGBA1C 12.1 08/02/2013    No results found for this basenameConcepcion Pennington: MICROALBUR, [id number]    Lab Results  Component Value Date   HDL 43 08/02/2013   LDLCALC 74 08/02/2013   TRIG 146 08/02/2013   CHOLHDL 3.4 08/02/2013      Assessment: 1.  Diabetes.  Not at goals - needs education 2.  Blood Pressure.  At goal today 3.  Lipids.  At goals 4.  Foot Care.  Sees Dr Ulice Brilliantrake regularly - podiatrist 5.  Memory difficulties since CVA  Recommendations: 1.  Medication recommendations at this time are as follows:  Start metformin XR 500mg  1 tablet dialy with largest meal.   Change Lantus to 45 units BID  Rx for test strips given today 2.  Reviewed HBG goals:  Fasting 80-130 and 1-2 hour post prandial <180.  Patient is instructed to check BG 2 times per day.    3.  BP goal < 140/85. 4.  LDL goal of < 100, HDL > 40 and TG < 150. 5.  Eye Exam  yearly and Dental Exam every 6 months. 6.  Dietary recommendations:  Discussed CHO counting in depth, Reviewed how to read a nutrition label.   Increase non-starchy vegetables - carrots, green bean, squash, zucchini, tomatoes, onions, peppers, spinach and other green leafy vegetables, cabbage, lettuce, cucumbers, asparagus, okra (not fried), eggplant limit sugar and processed foods (cakes, cookies, ice cream, crackers and chips) Increase fresh fruit but limit serving sizes 1/2 cup or about the size of tennis or baseball limit red meat to no more than 1-2 times per week (serving size about the size of your palm) Choose whole grains / leans proteins - whole wheat bread, quinoa, whole grain rice (1/2 cup), fish, chicken, [place]  7.  Physical Activity recommendations:  Start to increase as able - even 5 to 10 minutes    8.  Return to clinic in 4-6 wks   Time spent counseling patient:  60 minutes  Referring provider:  Jannifer RodneyHawks, Christy, NP

## 2013-08-15 NOTE — Patient Instructions (Addendum)
Stop metfromin 1038m since causing diarrhea - try in place of it Metformin XR 5060m1 tablet daily with supper.  Change Lantus to 45 units twice a day.  Start checking blood glucose twice a day.   Increase non-starchy vegetables - carrots, green bean, squash, zucchini, tomatoes, onions, peppers, spinach and other green leafy vegetables, cabbage, lettuce, cucumbers, asparagus, okra (not fried), eggplant limit sugar and processed foods (cakes, cookies, ice cream, crackers and chips) Increase fresh fruit but limit serving sizes 1/2 cup or about the size of tennis or baseball limit red meat to now more than 1-2 times per week (serving size about the size of your palm) Choose whole grains / leans proteins - whole wheat bread, quinoa, whole grain rice (1/2 cup), fish, chicken, tuKuwaitDiabetes and Standards of Medical Care  Diabetes is complicated. You may find that your diabetes team includes a dietitian, nurse, diabetes educator, eye doctor, and more. To help everyone know what is going on and to help you get the care you deserve, the following schedule of care was developed to help keep you on track. Below are the tests, exams, vaccines, medicines, education, and plans you will need.  Blood Glucose Goals Prior to meals = 80 - 130 Within 2 hours of the start of a meal = less than 180  HbA1c test (goal is less than 7.0% - your last value was 12.1%) This test shows how well you have controlled your glucose over the past 2 3 months. It is used to see if your diabetes management plan needs to be adjusted.   It is performed at least 2 times a year if you are meeting treatment goals.  It is performed 4 times a year if therapy has changed or if you are not meeting treatment goals.   Blood pressure test  This test is performed at every routine medical visit. The goal is less than 140/90 mmHg for most people, but 130/80 mmHg in some cases. Ask your health care provider about your goal. Dental  exam  Follow up with the dentist regularly.  Eye exam  If you are diagnosed with type 1 diabetes as a child, get an exam upon reaching the age of 1097ears or older and have had diabetes for 3 5 years. Yearly eye exams are recommended after that initial eye exam.  If you are diagnosed with type 1 diabetes as an adult, get an exam within 5 years of diagnosis and then yearly.  If you are diagnosed with type 2 diabetes, get an exam as soon as possible after the diagnosis and then yearly.  Foot care exam  Visual foot exams are performed at every routine medical visit. The exams check for cuts, injuries, or other problems with the feet.  A comprehensive foot exam should be done yearly. This includes visual inspection as well as assessing foot pulses and testing for loss of sensation.  Check your feet nightly for cuts, injuries, or other problems with your feet. Tell your health care provider if anything is not healing.  Kidney function test (urine microalbumin)  This test is performed once a year.  Type 1 diabetes: The first test is performed 5 years after diagnosis.  Type 2 diabetes: The first test is performed at the time of diagnosis.  A serum creatinine and estimated glomerular filtration rate (eGFR) test is done once a year to assess the level of chronic kidney disease (CKD), if present.  Lipid profile (cholesterol, HDL, LDL, triglycerides)  Performed  every 5 years for most people.  The goal for LDL is less than 100 mg/dL. If you are at high risk, the goal is less than 70 mg/dL.  The goal for HDL is 40 mg/dL 50 mg/dL for men and 50 mg/dL 60 mg/dL for women. An HDL cholesterol of 60 mg/dL or higher gives some protection against heart disease.  The goal for triglycerides is less than 150 mg/dL.  Influenza vaccine, pneumococcal vaccine, and hepatitis B vaccine  The influenza vaccine is recommended yearly.  The pneumococcal vaccine is generally given once in a lifetime.  However, there are some instances when another vaccination is recommended. Check with your health care provider.  The hepatitis B vaccine is also recommended for adults with diabetes.  Diabetes self-management education  Education is recommended at diagnosis and ongoing as needed.  Treatment plan  Your treatment plan is reviewed at every medical visit. Document Released: 11/29/2008 Document Revised: 10/04/2012 Document Reviewed: 07/04/2012 [facility] Patient Information 2014 [place].

## 2013-08-16 ENCOUNTER — Other Ambulatory Visit: Payer: Self-pay | Admitting: Family

## 2013-08-20 ENCOUNTER — Other Ambulatory Visit: Payer: Self-pay | Admitting: Family

## 2013-08-20 NOTE — Telephone Encounter (Signed)
Called to [facility] 

## 2013-08-22 NOTE — Telephone Encounter (Signed)
Patient last seen in office on 08-15-13 by pharmacist. Rx last filled on 07-19-13 for #90. Please advise. If approved please route to pool A so nurse can call in to pharmacy

## 2013-08-23 NOTE — Telephone Encounter (Signed)
Called in.

## 2013-09-05 NOTE — Telephone Encounter (Signed)
OPENED IN ERROR

## 2013-09-14 DIAGNOSIS — IMO0002 Reserved for concepts with insufficient information to code with codable children: Secondary | ICD-10-CM | POA: Diagnosis not present

## 2013-09-20 ENCOUNTER — Ambulatory Visit: Payer: Self-pay

## 2013-09-27 DIAGNOSIS — M25519 Pain in unspecified shoulder: Secondary | ICD-10-CM | POA: Diagnosis not present

## 2013-10-03 DIAGNOSIS — Z5189 Encounter for other specified aftercare: Secondary | ICD-10-CM | POA: Diagnosis not present

## 2013-10-09 DIAGNOSIS — M24119 Other articular cartilage disorders, unspecified shoulder: Secondary | ICD-10-CM | POA: Diagnosis not present

## 2013-10-09 DIAGNOSIS — M719 Bursopathy, unspecified: Secondary | ICD-10-CM | POA: Diagnosis not present

## 2013-10-09 DIAGNOSIS — S46819A Strain of other muscles, fascia and tendons at shoulder and upper arm level, unspecified arm, initial encounter: Secondary | ICD-10-CM | POA: Diagnosis not present

## 2013-10-09 DIAGNOSIS — M19019 Primary osteoarthritis, unspecified shoulder: Secondary | ICD-10-CM | POA: Diagnosis not present

## 2013-10-09 DIAGNOSIS — S43499A Other sprain of unspecified shoulder joint, initial encounter: Secondary | ICD-10-CM | POA: Diagnosis not present

## 2013-10-09 DIAGNOSIS — M67919 Unspecified disorder of synovium and tendon, unspecified shoulder: Secondary | ICD-10-CM | POA: Diagnosis not present

## 2013-10-09 DIAGNOSIS — M25819 Other specified joint disorders, unspecified shoulder: Secondary | ICD-10-CM | POA: Diagnosis not present

## 2013-10-09 DIAGNOSIS — G8918 Other acute postprocedural pain: Secondary | ICD-10-CM | POA: Diagnosis not present

## 2013-10-16 ENCOUNTER — Other Ambulatory Visit: Payer: Self-pay | Admitting: Family

## 2013-10-17 DIAGNOSIS — M25519 Pain in unspecified shoulder: Secondary | ICD-10-CM | POA: Diagnosis not present

## 2013-10-17 NOTE — Telephone Encounter (Signed)
Last refill on Xanax 09/18/13 and last refill on Ambien 09/15/13. Last ov 7/15 with Tammy. Pt did see Neysa Bonito but she is out of office today. Can you review please? If approved call to [facility] [place].Thanks.

## 2013-10-18 NOTE — Telephone Encounter (Signed)
Left refill authorization on [facility]icemail. Did not provide any refills because signed order did not have any refills and I didn't notice the written order until after it was called in.

## 2013-10-18 NOTE — Telephone Encounter (Signed)
Please call in Charles Pennington and xanax with 1 refill each

## 2013-10-23 ENCOUNTER — Ambulatory Visit: Payer: Medicare Other | Admitting: Physical Therapy

## 2013-11-14 ENCOUNTER — Other Ambulatory Visit: Payer: Self-pay

## 2013-11-14 NOTE — Telephone Encounter (Signed)
Last seen 07/19/13 Christy  If approved route to nurse [facility] 

## 2013-11-15 ENCOUNTER — Other Ambulatory Visit: Payer: Self-pay | Admitting: Nurse Practitioner

## 2013-11-16 MED ORDER — ZOLPIDEM TARTRATE 10 MG PO TABS: ORAL_TABLET | ORAL | Status: DC

## 2013-11-16 MED ORDER — ALPRAZOLAM 0.5 MG PO TABS: ORAL_TABLET | ORAL | Status: DC

## 2013-11-16 NOTE — Telephone Encounter (Signed)
rx called into pharmacy

## 2013-12-10 ENCOUNTER — Other Ambulatory Visit: Payer: Self-pay | Admitting: Family

## 2013-12-11 ENCOUNTER — Other Ambulatory Visit: Payer: Self-pay

## 2013-12-11 MED ORDER — INSULIN GLARGINE 100 UNIT/ML SOLOSTAR PEN: 45.0000 [IU] | PEN_INJECTOR | Freq: Two times a day (BID) | SUBCUTANEOUS | Status: DC

## 2013-12-11 NOTE — Telephone Encounter (Signed)
Last seen and last glucose 07/19/13 Charles Pennington

## 2013-12-11 NOTE — Telephone Encounter (Signed)
x

## 2013-12-14 ENCOUNTER — Other Ambulatory Visit: Payer: Self-pay | Admitting: Family

## 2013-12-17 ENCOUNTER — Other Ambulatory Visit: Payer: Self-pay | Admitting: Family

## 2013-12-17 ENCOUNTER — Telehealth: Payer: Self-pay | Admitting: Nurse Practitioner

## 2013-12-17 NOTE — Telephone Encounter (Signed)
Requesting refills

## 2013-12-18 ENCOUNTER — Telehealth: Payer: Self-pay | Admitting: Family

## 2013-12-19 ENCOUNTER — Other Ambulatory Visit: Payer: Self-pay

## 2013-12-19 MED ORDER — ZOLPIDEM TARTRATE 10 MG PO TABS: ORAL_TABLET | ORAL | Status: DC

## 2013-12-19 MED ORDER — ALPRAZOLAM 0.5 MG PO TABS: ORAL_TABLET | ORAL | Status: DC

## 2013-12-19 NOTE — Telephone Encounter (Signed)
[facility] today.

## 2013-12-19 NOTE — Telephone Encounter (Signed)
Last seen 07/19/13 Christy  If approved route to nurse [facility] 

## 2013-12-19 NOTE — Telephone Encounter (Signed)
Last saw Charles Pennington 08/15/13, saw Charles Pennington in 06/15. Both meds refilled 11/16/13

## 2013-12-19 NOTE — Telephone Encounter (Signed)
Last seen 07/19/13 Neysa BonitoChristy  If approved route to nurse [facility]

## 2013-12-19 NOTE — Telephone Encounter (Signed)
Please call in ambien 10 mg 1 po qhs #30  with 0 refills Please call in xanax 0.5mg  1 po TID #90  with 0 refills

## 2013-12-19 NOTE — Telephone Encounter (Signed)
Already done today 

## 2014-01-03 DIAGNOSIS — M25511 Pain in right shoulder: Secondary | ICD-10-CM | POA: Diagnosis not present

## 2014-01-11 ENCOUNTER — Other Ambulatory Visit: Payer: Self-pay | Admitting: Nurse Practitioner

## 2014-01-16 ENCOUNTER — Other Ambulatory Visit: Payer: Self-pay | Admitting: Nurse Practitioner

## 2014-01-16 ENCOUNTER — Other Ambulatory Visit: Payer: Self-pay | Admitting: Pharmacist

## 2014-01-17 ENCOUNTER — Telehealth: Payer: Self-pay | Admitting: Family

## 2014-01-18 ENCOUNTER — Other Ambulatory Visit: Payer: Self-pay | Admitting: Nurse Practitioner

## 2014-01-18 NOTE — Telephone Encounter (Signed)
Patient of Jannifer RodneyChristy [name]. Last seen in office on 08-15-13. Has upcoming appt with Christy on 03-15-14. Ambien last refill on 12-19-13 for #30. Xanax last filled on 12-19-13 for #90. Please advise. If approved please route to pool A so nurse can phone in to pharmacy

## 2014-01-18 NOTE — Telephone Encounter (Signed)
Both of these are okay to refill 1

## 2014-01-19 NOTE — Telephone Encounter (Signed)
Pt called during the Saturday clinic [facility] refilling his Ambien and Xanax, [place] [facility] [place] call.

## 2014-02-10 ENCOUNTER — Other Ambulatory Visit: Payer: Self-pay | Admitting: Family

## 2014-02-10 ENCOUNTER — Other Ambulatory Visit: Payer: Self-pay | Admitting: Pharmacist

## 2014-02-12 ENCOUNTER — Other Ambulatory Visit: Payer: Self-pay | Admitting: Family

## 2014-02-12 MED ORDER — METFORMIN HCL ER 500 MG PO TB24: ORAL_TABLET | ORAL | Status: DC

## 2014-02-12 MED ORDER — ZOLPIDEM TARTRATE 10 MG PO TABS: 10.0000 mg | ORAL_TABLET | Freq: Every evening | ORAL | Status: DC | PRN

## 2014-02-12 MED ORDER — ALPRAZOLAM 0.5 MG PO TABS: 0.5000 mg | ORAL_TABLET | Freq: Three times a day (TID) | ORAL | Status: DC

## 2014-02-12 MED ORDER — INSULIN GLARGINE 100 UNIT/ML SOLOSTAR PEN: PEN_INJECTOR | SUBCUTANEOUS | Status: DC

## 2014-02-12 NOTE — Telephone Encounter (Signed)
Please call in xanax 0.5mg 1 po TID #90 with 0 refills Please call in ambien 10mg 1 po Qhs #30  with 0 refills 

## 2014-02-12 NOTE — Telephone Encounter (Signed)
Aware,scripts done. 

## 2014-02-12 NOTE — Telephone Encounter (Signed)
Has appt scheduled Jan. 29 with C. [name].  Last visit with pharmacist 08-15-13 with labs on 07-19-13. Please advise on refills.

## 2014-02-17 ENCOUNTER — Other Ambulatory Visit: Payer: Self-pay | Admitting: Family Medicine

## 2014-02-17 DIAGNOSIS — S6991XA Unspecified injury of right wrist, hand and finger(s), initial encounter: Secondary | ICD-10-CM | POA: Diagnosis not present

## 2014-02-17 DIAGNOSIS — S60011A Contusion of right thumb without damage to nail, initial encounter: Secondary | ICD-10-CM | POA: Diagnosis not present

## 2014-02-17 DIAGNOSIS — E119 Type 2 diabetes mellitus without complications: Secondary | ICD-10-CM | POA: Diagnosis not present

## 2014-02-17 DIAGNOSIS — M79644 Pain in right finger(s): Secondary | ICD-10-CM | POA: Diagnosis not present

## 2014-02-17 DIAGNOSIS — Z794 Long term (current) use of insulin: Secondary | ICD-10-CM | POA: Diagnosis not present

## 2014-02-17 DIAGNOSIS — Z79899 Other long term (current) drug therapy: Secondary | ICD-10-CM | POA: Diagnosis not present

## 2014-02-17 DIAGNOSIS — S63641A Sprain of metacarpophalangeal joint of right thumb, initial encounter: Secondary | ICD-10-CM | POA: Diagnosis not present

## 2014-02-17 DIAGNOSIS — M19041 Primary osteoarthritis, right hand: Secondary | ICD-10-CM | POA: Diagnosis not present

## 2014-02-17 DIAGNOSIS — I10 Essential (primary) hypertension: Secondary | ICD-10-CM | POA: Diagnosis not present

## 2014-02-17 DIAGNOSIS — Z720 Tobacco use: Secondary | ICD-10-CM | POA: Diagnosis not present

## 2014-02-18 ENCOUNTER — Other Ambulatory Visit: Payer: Self-pay | Admitting: Family Medicine

## 2014-02-19 ENCOUNTER — Other Ambulatory Visit: Payer: Self-pay | Admitting: Family Medicine

## 2014-02-19 NOTE — Telephone Encounter (Signed)
Last seen 10/16/13 Neysa BonitoChristy  Has upcoming appt Neysa BonitoChristy 03/15/14  If approved route [facility]

## 2014-02-19 NOTE — Telephone Encounter (Signed)
[facility] [place] will close call.

## 2014-02-19 NOTE — Telephone Encounter (Signed)
Please call in ambien  with 0 refills 

## 2014-02-24 ENCOUNTER — Emergency Department (HOSPITAL_COMMUNITY): Payer: Medicare Other

## 2014-02-24 ENCOUNTER — Encounter (HOSPITAL_COMMUNITY): Payer: Self-pay

## 2014-02-24 ENCOUNTER — Emergency Department (HOSPITAL_COMMUNITY)
Admission: EM | Admit: 2014-02-24 | Discharge: 2014-02-24 | Disposition: A | Discharge status: Home / Self Care | Payer: Medicare Other | Attending: Emergency Medicine | Admitting: Emergency Medicine

## 2014-02-24 DIAGNOSIS — S62521A Displaced fracture of distal phalanx of right thumb, initial encounter for closed fracture: Secondary | ICD-10-CM | POA: Diagnosis not present

## 2014-02-24 DIAGNOSIS — E669 Obesity, unspecified: Secondary | ICD-10-CM | POA: Diagnosis not present

## 2014-02-24 DIAGNOSIS — E039 Hypothyroidism, unspecified: Secondary | ICD-10-CM | POA: Insufficient documentation

## 2014-02-24 DIAGNOSIS — J45909 Unspecified asthma, uncomplicated: Secondary | ICD-10-CM | POA: Diagnosis not present

## 2014-02-24 DIAGNOSIS — S20211A Contusion of right front wall of thorax, initial encounter: Secondary | ICD-10-CM

## 2014-02-24 DIAGNOSIS — Y92009 Unspecified place in unspecified non-institutional (private) residence as the place of occurrence of the external cause: Secondary | ICD-10-CM | POA: Insufficient documentation

## 2014-02-24 DIAGNOSIS — W19XXXA Unspecified fall, initial encounter: Secondary | ICD-10-CM

## 2014-02-24 DIAGNOSIS — Y9389 Activity, other specified: Secondary | ICD-10-CM | POA: Insufficient documentation

## 2014-02-24 DIAGNOSIS — F419 Anxiety disorder, unspecified: Secondary | ICD-10-CM | POA: Insufficient documentation

## 2014-02-24 DIAGNOSIS — I10 Essential (primary) hypertension: Secondary | ICD-10-CM | POA: Insufficient documentation

## 2014-02-24 DIAGNOSIS — Z8673 Personal history of transient ischemic attack (TIA), and cerebral infarction without residual deficits: Secondary | ICD-10-CM | POA: Insufficient documentation

## 2014-02-24 DIAGNOSIS — Z720 Tobacco use: Secondary | ICD-10-CM | POA: Insufficient documentation

## 2014-02-24 DIAGNOSIS — E119 Type 2 diabetes mellitus without complications: Secondary | ICD-10-CM | POA: Diagnosis not present

## 2014-02-24 DIAGNOSIS — R0781 Pleurodynia: Secondary | ICD-10-CM | POA: Diagnosis not present

## 2014-02-24 DIAGNOSIS — S6992XA Unspecified injury of left wrist, hand and finger(s), initial encounter: Secondary | ICD-10-CM | POA: Diagnosis present

## 2014-02-24 DIAGNOSIS — Y998 Other external cause status: Secondary | ICD-10-CM | POA: Insufficient documentation

## 2014-02-24 DIAGNOSIS — S62501A Fracture of unspecified phalanx of right thumb, initial encounter for closed fracture: Secondary | ICD-10-CM

## 2014-02-24 DIAGNOSIS — W010XXA Fall on same level from slipping, tripping and stumbling without subsequent striking against object, initial encounter: Secondary | ICD-10-CM | POA: Diagnosis not present

## 2014-02-24 DIAGNOSIS — S299XXA Unspecified injury of thorax, initial encounter: Secondary | ICD-10-CM | POA: Diagnosis not present

## 2014-02-24 MED ORDER — OXYCODONE-ACETAMINOPHEN 5-325 MG PO TABS: 1.0000 | ORAL_TABLET | ORAL | Status: DC | PRN

## 2014-02-24 NOTE — ED Provider Notes (Signed)
CSN: [id number]     Arrival date & time 02/24/14  0927 History  This chart was scribed for non-physician practitioner Pauline Aus, PA-C working with Glynn Octave, MD by Murriel Hopper, ED Scribe. This patient was seen in room APFT22/APFT22 and the patient's care was started at 11:17 AM.     Chief Complaint  Patient presents with  . Fall    The history is provided by the patient. No language interpreter was used.     HPI Comments: Charles Pennington is a 49 y.o. male who presents to the Emergency Department complaining of constant right side pain with associated right thumb pain after falling while working in his garage two days ago. Pt states that he slipped and landed on his right side. Pt notes that he tried to catch himself with his right hand, but that his hand got caught up underneath his side, and states that he jammed his thumb in the process when he landed. Pt notes that his right side is painful when he coughs, breathes, or moves to change positions from sitting to standing. Pt states that he has taken Percocet, which was given to him by his mother, to relieve pain, and notes that his pain relief with it has been adequate. Pt denies shortness of breath, abdominal pain, back pain or bloody urine.  He also denies other injuries related to the fall.   Past Medical History  Diagnosis Date  . Diabetes mellitus   . Obesity   . Hypertension   . Hypothyroidism   . Vertigo   . Low back pain   . SOB (shortness of breath)   . Asthma   . Hypercholesteremia   . Anxiety   . Stroke age 31   Past Surgical History  Procedure Laterality Date  . Wrist surgery    . Spine surgery    . Foot surgery      Dr Ulice Brilliant   Family History  Problem Relation Age of Onset  . Hypertension Father   . Suicidality Father   . Diabetes Father   . Diabetes Mother    History  Substance Use Topics  . Smoking status: Current Every Day Smoker -- 0.50 packs/day    Types: Cigarettes  . Smokeless  tobacco: Never Used  . Alcohol Use: Yes     Comment: occasional per pt    Review of Systems  Respiratory: Negative for chest tightness and shortness of breath.   Cardiovascular: Positive for chest pain (right rib pain).  Gastrointestinal: Negative for nausea, vomiting and abdominal pain.  Genitourinary: Negative for hematuria and flank pain.  Musculoskeletal: Positive for arthralgias (right thumb pain). Negative for neck pain.  Skin: Negative for wound.  Neurological: Negative for seizures, syncope, weakness, numbness and headaches.  All other systems reviewed and are negative.     Allergies  Hydrocodone-acetaminophen  Home Medications   Prior to Admission medications   Medication Sig Start Date End Date Taking? Authorizing Provider  ALPRAZolam Prudy Feeler) 0.5 MG tablet Take 1 tablet (0.5 mg total) by mouth 3 (three) times daily. 02/12/14  Yes Mary-Margaret Daphine Deutscher, FNP  amLODipine (NORVASC) 5 MG tablet Take 1 tablet (5 mg total) by mouth daily. 07/19/13  Yes Junie Spencer, FNP  atorvastatin (LIPITOR) 40 MG tablet Take 1 tablet (40 mg total) by mouth daily. 07/19/13  Yes Junie Spencer, FNP  fenofibrate 160 MG tablet Take 1 tablet (160 mg total) by mouth daily. 07/19/13  Yes Junie Spencer, FNP  gabapentin (NEURONTIN) 600  MG tablet Take 600 mg by mouth 2 (two) times daily.   Yes Historical Provider, MD  Insulin Glargine (LANTUS SOLOSTAR) 100 UNIT/ML Solostar Pen INJECT 45 UNITS INTO THE SKIN 2 (TWO) TIMES DAILY. 02/12/14  Yes Mary-Margaret Daphine DeutscherMartin, FNP  levothyroxine (SYNTHROID, LEVOTHROID) 112 MCG tablet Take 1 tablet (112 mcg total) by mouth daily. 07/19/13  Yes [name] [name], FNP  LYRICA 50 MG capsule Take 50 mg by mouth 2 (two) times daily. 02/14/14  Yes Historical Provider, MD  metFORMIN (GLUCOPHAGE-XR) 500 MG 24 hr tablet TAKE 1 TABLET (500 MG TOTAL) BY MOUTH DAILY WITH SUPPER. 02/12/14  Yes Mary-Margaret Daphine DeutscherMartin, FNP  omeprazole (PRILOSEC) 20 MG capsule Take 1 capsule (20 mg total) by  mouth daily. 08/03/13  Yes Junie Spencerhristy A [name], FNP  PARoxetine (PAXIL) 40 MG tablet Take 1 tablet (40 mg total) by mouth daily. 07/19/13  Yes Junie Spencerhristy A [name], FNP  zolpidem (AMBIEN) 10 MG tablet TAKE 1 TABLET BY MOUTH AT BEDTIME AS NEEDED Patient taking differently: TAKE 1 TABLET BY MOUTH AT BEDTIME AS NEEDED SLEEP 02/19/14  Yes Mary-Margaret Daphine DeutscherMartin, FNP  albuterol (PROVENTIL HFA;VENTOLIN HFA) 108 (90 BASE) MCG/ACT inhaler Inhale 2 puffs into the lungs every 6 (six) hours as needed for wheezing. 07/19/13   Junie Spencerhristy A [name], FNP  Glucose Blood (BLOOD GLUCOSE TEST STRIPS) STRP Use to check BG up to BID.  Dx:  250.02 Patient not taking: Reported on 02/24/2014 08/15/13   Marjorie Lussier Eckard, PHARMD   BP 112/75 mmHg  Pulse 94  Temp(Src) 98.4 F (36.9 C) (Core (Comment))  Resp 20  Ht 5\' 8"  (1.727 m)  Wt 260 lb (117.935 kg)  BMI 39.54 kg/m2  SpO2 96% Physical Exam  Constitutional: He is oriented to person, place, and time. He appears well-developed and well-nourished.  HENT:  Head: Normocephalic and atraumatic.  Neck: Normal range of motion. Neck supple.  Cardiovascular: Normal rate, regular rhythm, normal heart sounds and intact distal pulses.   No murmur heard. Pulmonary/Chest: Effort normal and breath sounds normal. He exhibits tenderness.  Tenderness to anterior and lateral right lower ribs No splinting or crepitus    Abdominal: Soft. He exhibits no distension. There is no tenderness. There is no rebound and no guarding.  Musculoskeletal: He exhibits tenderness. He exhibits no edema.  Tenderness at proximal right thumb. Pt has full ROM of the joint No bony deformities Wrist is non-tender   Neurological: He is alert and oriented to person, place, and time. He exhibits normal muscle tone. Coordination normal.  Right radial pulse and sensation intact  Skin: Skin is warm and dry.  Psychiatric: He has a normal mood and affect.  Nursing note and vitals reviewed.   ED Course  Procedures (including critical  care time)  DIAGNOSTIC STUDIES: Oxygen Saturation is 96% on RA, normal by my interpretation.    COORDINATION OF CARE: 11:21 AM Discussed treatment plan with pt at bedside and pt agreed to plan.   Labs Review Labs Reviewed - No data to display  Imaging Review Dg Ribs Unilateral W/chest Right  02/24/2014   CLINICAL DATA:  Pain following fall 2 days prior  EXAM: RIGHT RIBS AND CHEST - 3+ VIEW  COMPARISON:  Chest radiograph March 26, 2010  FINDINGS: Frontal chest as well as oblique and cone-down lower rib images were obtained. There is evidence of an old healed fracture of the left clavicle with remodeling. Lungs are clear. Heart size and pulmonary vascularity are normal. No adenopathy. There is no effusion or pneumothorax. There  is evidence of old trauma involving the anterior left seventh and eighth ribs. No acute fracture is apparent.  IMPRESSION: No acute rib fracture apparent. Lungs clear. Evidence of old rib fractures on the left as well as prior clavicle fracture on the left, healed.   Electronically Signed   By: Bretta Bang M.D.   On: 02/24/2014 11:11   Dg Finger Thumb Right  02/24/2014   CLINICAL DATA:  49 year old male with history of trauma from a fall 2 days ago complaining of pain in the right thumb.  EXAM: RIGHT THUMB 2+V  COMPARISON:  No priors.  FINDINGS: Tiny osseous fragment adjacent to the volar plate of the base of the first distal phalanx of the right hand, compatible with a small avulsion fracture. Overlying soft tissues appear mildly swollen. No other acute displaced fracture, subluxation or dislocation.  IMPRESSION: 1. Small volar plate avulsion fracture of the base of the first distal phalanx.   Electronically Signed   By: Trudie Reed M.D.   On: 02/24/2014 11:23       EKG Interpretation None      MDM   Final diagnoses:  Fracture of thumb, right, closed, initial encounter  Contusion of rib, right, initial encounter   XR neg for rib fx.  VSS.  No  splinting on exam.  NV intact.  Thumb spica splint applied.  Pt instructed to cough and deep breaths frequently.  He agrees to arrange PMD and orthopedic f/u.  Rx for percocet for pain.  Ambulates w/o difficulty, stable for d/c   I personally performed the services described in this documentation, which was scribed in my presence. The recorded information has been reviewed and is accurate.     Ayaan Ringle L. Trisha Mangle, PA-C 02/26/14 1404  Glynn Octave, MD 02/27/14 [id number]

## 2014-02-24 NOTE — Discharge Instructions (Signed)
Finger Fracture A finger fracture is when one or more bones in the finger break.  HOME CARE   Wear the splint, tape, or cast as long as told by your doctor.  Keep your fingers in the position your doctor tell you to.  Raise (elevate) the injured area above the level of the heart.  Only take medicine as told by your doctor.  Put ice on the injured area.  Put ice in a plastic bag.  Place a towel between the skin and the bag.  Leave the ice on for 15-20 minutes, 03-04 times a day.  Follow up with your doctor.  Ask what exercises you can do when the splint comes off. GET HELP RIGHT AWAY IF:   The fingernails are white or bluish.  You have pain not helped by medicine.  You cannot move your fingertips.  You lose feeling (numbness) in the injured finger(s). MAKE SURE YOU:   Understand these instructions.  Will watch this condition.  Will get help right away if you are not doing well or get worse. Document Released: 07/21/2007 Document Revised: 04/26/2011 Document Reviewed: 07/21/2007 [facility] Patient Information 2015 [place], [place]. This information is not intended to replace advice given to you by your health care provider. Make sure you discuss any questions you have with your health care provider.  Rib Contusion A rib contusion (bruise) can occur by a blow to the chest or by a fall against a hard object. Usually these will be much better in a couple weeks. If X-rays were taken today and there are no broken bones (fractures), the diagnosis of bruising is made. However, broken ribs may not show up for several days, or may be discovered later on a routine X-ray when signs of healing show up. If this happens to you, it does not mean that something was missed on the X-ray, but simply that it did not show up on the first X-rays. Earlier diagnosis will not usually change the treatment. HOME CARE INSTRUCTIONS   Avoid strenuous activity. Be careful during activities and avoid  bumping the injured ribs. Activities that pull on the injured ribs and cause pain should be avoided, if possible.  For the first day or two, an ice pack used every 20 minutes while awake may be helpful. Put ice in a plastic bag and put a towel between the bag and the skin.  Eat a normal, well-balanced diet. Drink plenty of fluids to avoid constipation.  Take deep breaths several times a day to keep lungs free of infection. Try to cough several times a day. Splint the injured area with a pillow while coughing to ease pain. Coughing can help prevent pneumonia.  Wear a rib belt or binder only if told to do so by your caregiver. If you are wearing a rib belt or binder, you must do the breathing exercises as directed by your caregiver. If not used properly, rib belts or binders restrict breathing which can lead to pneumonia.  Only take over-the-counter or prescription medicines for pain, discomfort, or fever as directed by your caregiver. SEEK MEDICAL CARE IF:   You or your child has an oral temperature above 102 F (38.9 C).  Your baby is older than 3 months with a rectal temperature of 100.5 F (38.1 C) or higher for more than 1 day.  You develop a cough, with thick or bloody sputum. SEEK IMMEDIATE MEDICAL CARE IF:   You have difficulty breathing.  You feel sick to your stomach (nausea),  have vomiting or belly (abdominal) pain.  You have worsening pain, not controlled with medications, or there is a change in the location of the pain.  You develop sweating or radiation of the pain into the arms, jaw or shoulders, or become light headed or faint.  You or your child has an oral temperature above 102 F (38.9 C), not controlled by medicine.  Your or your baby is older than 3 months with a rectal temperature of 102 F (38.9 C) or higher.  Your baby is 813 months old or younger with a rectal temperature of 100.4 F (38 C) or higher. MAKE SURE YOU:   Understand these  instructions.  Will watch your condition.  Will get help right away if you are not doing well or get worse. Document Released: 10/27/2000 Document Revised: 05/29/2012 Document Reviewed: 09/20/2007 [facility] Patient Information 2015 [place], [place]. This information is not intended to replace advice given to you by your health care provider. Make sure you discuss any questions you have with your health care provider.

## 2014-02-24 NOTE — ED Notes (Signed)
Pt reports slipped and fell at home in his shop.  C/O pain to R ribs and r thumb.

## 2014-02-28 ENCOUNTER — Ambulatory Visit (INDEPENDENT_AMBULATORY_CARE_PROVIDER_SITE_OTHER): Payer: Medicare Other | Admitting: Orthopedic Surgery

## 2014-02-28 VITALS — Ht 68.0 in | Wt 260.0 lb

## 2014-02-28 DIAGNOSIS — S63641A Sprain of metacarpophalangeal joint of right thumb, initial encounter: Secondary | ICD-10-CM | POA: Diagnosis not present

## 2014-02-28 MED ORDER — OXYCODONE-ACETAMINOPHEN 5-325 MG PO TABS: 1.0000 | ORAL_TABLET | Freq: Four times a day (QID) | ORAL | Status: DC | PRN

## 2014-03-01 ENCOUNTER — Encounter: Payer: Self-pay | Admitting: Orthopedic Surgery

## 2014-03-01 DIAGNOSIS — S63649A Sprain of metacarpophalangeal joint of unspecified thumb, initial encounter: Secondary | ICD-10-CM | POA: Insufficient documentation

## 2014-03-01 NOTE — Progress Notes (Addendum)
Subjective:     Patient ID: Charles Pennington, male   DOB: 1965-04-28, 49 y.o.   MRN: [id number]  Hand Pain    Chief Complaint  Patient presents with  . Hand Pain    er follow up, Rt thumb fx, DOI 02/24/14   49 year old male right hand dominant disabled injured his right thumb complains of pain over the ulnar collateral ligament although his x-ray and ER visit notes indicate he has a fracture at the distal phalanx and IP joint area. He does not complain of pain in this area. He complains of pain and swelling over the ulnar collateral ligament of the right thumb with painful range of motion and weakness on pinch activities   Review of Systems Systems review is negative. Patient does have some arthralgias occasional chest tightness shortness of breath and right rib pain unrelated.    Objective:   Physical Exam Normal development grooming and hygiene, alert and awake alert oriented 3. Mood and affect normal gait and station normal Ht 5\' 8"  (1.727 m)  Wt 260 lb (117.935 kg)  BMI 39.54 kg/m2  Right thumb tenderness over the ulnar collateral ligament no tenderness around the interphalangeal joint no deformity pain with radial stress and laxity noted. Opposite thumb shows no laxity. Thumb was tested in extension as well as flexion and there is definite laxity there. However does not appear to Open and what I would call a significant manner. Flexion diminished flexor extensor tendons are intact neurovascular exam is intact skin is intact    Assessment:     X-rays do show some type of bone chip around the IP joint of the thumb but again the patient is asymptomatic here and I think he has a  Encounter Diagnosis  Name Primary?  . Rupture of ulnar collateral ligament of thumb, right, initial encounter Yes       Plan:     Thumb splint 6 weeks recheck if no improvement consider for ulnar collateral ligament repair. I have decided on conservative treatment versus repair based on his work  status. Low demand for the use of his hand at this point.

## 2014-03-10 ENCOUNTER — Other Ambulatory Visit: Payer: Self-pay | Admitting: Family

## 2014-03-11 ENCOUNTER — Other Ambulatory Visit: Payer: Self-pay | Admitting: Family

## 2014-03-13 ENCOUNTER — Other Ambulatory Visit: Payer: Self-pay | Admitting: Nurse Practitioner

## 2014-03-15 ENCOUNTER — Ambulatory Visit (INDEPENDENT_AMBULATORY_CARE_PROVIDER_SITE_OTHER): Payer: Medicare Other | Admitting: Family

## 2014-03-15 ENCOUNTER — Encounter: Payer: Self-pay | Admitting: Family

## 2014-03-15 VITALS — BP 149/99 | HR 103 | Temp 97.3°F | Ht 68.0 in | Wt 259.0 lb

## 2014-03-15 DIAGNOSIS — Z23 Encounter for immunization: Secondary | ICD-10-CM | POA: Diagnosis not present

## 2014-03-15 DIAGNOSIS — Z1321 Encounter for screening for nutritional disorder: Secondary | ICD-10-CM

## 2014-03-15 DIAGNOSIS — E119 Type 2 diabetes mellitus without complications: Secondary | ICD-10-CM | POA: Diagnosis not present

## 2014-03-15 DIAGNOSIS — Z794 Long term (current) use of insulin: Secondary | ICD-10-CM

## 2014-03-15 DIAGNOSIS — F411 Generalized anxiety disorder: Secondary | ICD-10-CM

## 2014-03-15 DIAGNOSIS — E039 Hypothyroidism, unspecified: Secondary | ICD-10-CM

## 2014-03-15 DIAGNOSIS — F32A Depression, unspecified: Secondary | ICD-10-CM

## 2014-03-15 DIAGNOSIS — E1142 Type 2 diabetes mellitus with diabetic polyneuropathy: Secondary | ICD-10-CM | POA: Diagnosis not present

## 2014-03-15 DIAGNOSIS — Z125 Encounter for screening for malignant neoplasm of prostate: Secondary | ICD-10-CM | POA: Diagnosis not present

## 2014-03-15 DIAGNOSIS — G4700 Insomnia, unspecified: Secondary | ICD-10-CM

## 2014-03-15 DIAGNOSIS — I10 Essential (primary) hypertension: Secondary | ICD-10-CM | POA: Diagnosis not present

## 2014-03-15 DIAGNOSIS — R197 Diarrhea, unspecified: Secondary | ICD-10-CM

## 2014-03-15 DIAGNOSIS — E1140 Type 2 diabetes mellitus with diabetic neuropathy, unspecified: Secondary | ICD-10-CM | POA: Diagnosis not present

## 2014-03-15 DIAGNOSIS — J4520 Mild intermittent asthma, uncomplicated: Secondary | ICD-10-CM | POA: Diagnosis not present

## 2014-03-15 DIAGNOSIS — J45909 Unspecified asthma, uncomplicated: Secondary | ICD-10-CM | POA: Diagnosis not present

## 2014-03-15 DIAGNOSIS — E785 Hyperlipidemia, unspecified: Secondary | ICD-10-CM

## 2014-03-15 DIAGNOSIS — K219 Gastro-esophageal reflux disease without esophagitis: Secondary | ICD-10-CM | POA: Diagnosis not present

## 2014-03-15 DIAGNOSIS — F329 Major depressive disorder, single episode, unspecified: Secondary | ICD-10-CM

## 2014-03-15 LAB — POCT UA - MICROALBUMIN: Microalbumin Ur, POC: 20 mg/L

## 2014-03-15 MED ORDER — HYDROCODONE-ACETAMINOPHEN 5-325 MG PO TABS: 1.0000 | ORAL_TABLET | Freq: Four times a day (QID) | ORAL | Status: DC | PRN

## 2014-03-15 MED ORDER — ALPRAZOLAM 0.5 MG PO TABS: 0.5000 mg | ORAL_TABLET | Freq: Three times a day (TID) | ORAL | Status: DC

## 2014-03-15 MED ORDER — CANAGLIFLOZIN-METFORMIN HCL 50-1000 MG PO TABS: 1.0000 | ORAL_TABLET | Freq: Two times a day (BID) | ORAL | Status: DC

## 2014-03-15 MED ORDER — ZOLPIDEM TARTRATE 10 MG PO TABS: 10.0000 mg | ORAL_TABLET | Freq: Every evening | ORAL | Status: DC | PRN

## 2014-03-15 NOTE — Progress Notes (Signed)
Subjective:    Patient ID: Charles Pennington, male    DOB: 04-11-1965, 49 y.o.   MRN: [id number]  Diabetes He presents for his follow-up diabetic visit. He has type 2 diabetes mellitus. His disease course has been fluctuating. Hypoglycemia symptoms include nervousness/anxiousness. Pertinent negatives for hypoglycemia include no confusion, dizziness, headaches or sleepiness. Associated symptoms include visual change. Pertinent negatives for diabetes include no blurred vision, no fatigue, no foot paresthesias and no foot ulcerations. There are no hypoglycemic complications. Symptoms are stable. Diabetic complications include a CVA and peripheral neuropathy. Pertinent negatives for diabetic complications include no heart disease or nephropathy. Risk factors for coronary artery disease include diabetes mellitus, dyslipidemia, hypertension, male sex and tobacco exposure. Current diabetic treatment includes insulin injections and oral agent (monotherapy). He is compliant with treatment all of the time. His weight is stable. He is following a generally healthy diet. He participates in exercise three times a week. His breakfast blood glucose range is generally 130-140 mg/dl. Eye exam is not current (Pt educated on importance of yearly eye exams).  Hyperlipidemia This is a chronic problem. The current episode started more than 1 year ago. The problem is controlled. Recent lipid tests were reviewed and are normal. Exacerbating diseases include diabetes and hypothyroidism. Factors aggravating his hyperlipidemia include smoking. Pertinent negatives include no leg pain, myalgias or shortness of breath. Current antihyperlipidemic treatment includes statins. The current treatment provides significant improvement of lipids. Risk factors for coronary artery disease include diabetes mellitus, dyslipidemia, hypertension, male sex and obesity.  Hypertension This is a chronic problem. The current episode started more than 1  year ago. The problem has been waxing and waning since onset. The problem is uncontrolled. Associated symptoms include anxiety. Pertinent negatives include no blurred vision, headaches, malaise/fatigue, palpitations, peripheral edema or shortness of breath. Risk factors for coronary artery disease include diabetes mellitus, dyslipidemia, male gender, obesity and smoking/tobacco exposure. Past treatments include calcium channel blockers. The current treatment provides mild improvement. Hypertensive end-organ damage includes CVA and a thyroid problem. There is no history of kidney disease, CAD/MI or heart failure. There is no history of sleep apnea.  Thyroid Problem Presents for follow-up visit. Symptoms include anxiety, depressed mood and visual change. Patient reports no constipation, diarrhea, dry skin, fatigue, leg swelling, nail problem or palpitations. The symptoms have been stable. Past treatments include levothyroxine. The treatment provided significant relief. His past medical history is significant for diabetes and hyperlipidemia. There is no history of heart failure.  Anxiety Presents for follow-up visit. Symptoms include depressed mood and nervous/anxious behavior. Patient reports no confusion, dizziness, insomnia, nausea, palpitations or shortness of breath. Symptoms occur rarely. The quality of sleep is good. Nighttime awakenings: none.   His past medical history is significant for anxiety/panic attacks and depression. There is no history of arrhythmia, CAD, CHF or fibromyalgia. Past treatments include SSRIs and benzodiazephines.  Gastrophageal Reflux He reports no belching, no coughing, no heartburn, no nausea, no sore throat or no tooth decay. This is a chronic problem. The current episode started more than 1 year ago. The problem occurs rarely. The problem has been resolved. Pertinent negatives include no fatigue. Risk factors include caffeine use. He has tried a PPI for the symptoms. The  treatment provided significant relief.    *Pt feel about 6 weeks ago and broke right thumb and possible rib fracture- Pt states he is still having a great deal of pain in right side of his ribs  Review of Systems  Constitutional: Negative.  Negative for malaise/fatigue and fatigue.  HENT: Negative.  Negative for sore throat.   Eyes: Negative for blurred vision.  Respiratory: Negative.  Negative for cough and shortness of breath.   Cardiovascular: Negative.  Negative for palpitations.  Gastrointestinal: Negative.  Negative for heartburn, nausea, diarrhea and constipation.  Endocrine: Negative.   Genitourinary: Negative.   Musculoskeletal: Negative.  Negative for myalgias.  Neurological: Negative.  Negative for dizziness and headaches.  Hematological: Negative.   Psychiatric/Behavioral: Negative for confusion. The patient is nervous/anxious. The patient does not have insomnia.   All other systems reviewed and are negative.      Objective:   Physical Exam  Constitutional: He is oriented to person, place, and time. He appears well-developed and well-nourished. No distress.  HENT:  Head: Normocephalic.  Right Ear: External ear normal.  Left Ear: External ear normal.  Mouth/Throat: Oropharynx is clear and moist.  Eyes: Pupils are equal, round, and reactive to light. Right eye exhibits no discharge. Left eye exhibits no discharge.  Neck: Normal range of motion. Neck supple. No thyromegaly present.  Cardiovascular: Normal rate, regular rhythm, normal heart sounds and intact distal pulses.   No murmur heard. Pulmonary/Chest: Effort normal and breath sounds normal. No respiratory distress. He has no wheezes.  Abdominal: Soft. Bowel sounds are normal. He exhibits no distension. There is no tenderness.  Musculoskeletal: He exhibits no edema or tenderness.  Neurological: He is alert and oriented to person, place, and time. He has normal reflexes. No cranial nerve deficit.  Skin: Skin is warm  and dry. No rash noted. No erythema.  Psychiatric: He has a normal mood and affect. His behavior is normal. Judgment and thought content normal.  Vitals reviewed.        BP 149/99 mmHg  Pulse 103  Temp(Src) 97.3 F (36.3 C) (Oral)  Ht 5' 8" (1.727 m)  Wt 259 lb (117.482 kg)  BMI 39.39 kg/m2  Assessment & Plan:  1. Essential hypertension - CMP14+EGFR; Future  2. Asthma, mild intermittent, uncomplicated - ZWC58+NIDP; Future  3. Gastroesophageal reflux disease, esophagitis presence not specified - CMP14+EGFR; Future  4. Type 2 diabetes mellitus treated with insulin -Pt started on Invomet today - POCT glycosylated hemoglobin (Hb A1C); Future - POCT UA - Microalbumin - CMP14+EGFR; Future  5. Hypothyroidism, unspecified hypothyroidism type - CMP14+EGFR; Future - Thyroid Panel With TSH; Future  6. Diabetic polyneuropathy associated with type 2 diabetes mellitus - CMP14+EGFR; Future  7. Hyperlipidemia - CMP14+EGFR; Future - Lipid panel; Future  8. Depression - CMP14+EGFR; Future  9. Diarrhea - CMP14+EGFR; Future - ALPRAZolam (XANAX) 0.5 MG tablet; Take 1 tablet (0.5 mg total) by mouth 3 (three) times daily.  Dispense: 90 tablet; Refill: 2  10. GAD (generalized anxiety disorder) - CMP14+EGFR; Future  11. Prostate cancer screening - PSA, total and free; Future  12. Encounter for vitamin deficiency screening - Vit D  25 hydroxy (rtn osteoporosis monitoring); Future  13. Insomnia - zolpidem (AMBIEN) 10 MG tablet; Take 1 tablet (10 mg total) by mouth at bedtime as needed.  Dispense: 90 tablet; Refill: 0   Continue all meds Labs ordered- Pt to get labs drawn next Monday- Pt just ate before appt Health Maintenance reviewed Diet and exercise encouraged RTO 3 months  Evelina Dun, FNP

## 2014-03-15 NOTE — Addendum Note (Signed)
Addended by: Tommas OlpHANDY, Omarius Grantham N on: 03/15/2014 03:02 PM   Modules accepted: Orders

## 2014-03-15 NOTE — Patient Instructions (Signed)

## 2014-03-16 LAB — MICROALBUMIN, URINE: Microalbumin, Urine: 3.7 ug/mL (ref 0.0–17.0)

## 2014-03-18 ENCOUNTER — Other Ambulatory Visit: Payer: Self-pay | Admitting: Nurse Practitioner

## 2014-03-18 ENCOUNTER — Other Ambulatory Visit (INDEPENDENT_AMBULATORY_CARE_PROVIDER_SITE_OTHER): Payer: Medicare Other

## 2014-03-18 DIAGNOSIS — Z794 Long term (current) use of insulin: Secondary | ICD-10-CM | POA: Diagnosis not present

## 2014-03-18 DIAGNOSIS — E039 Hypothyroidism, unspecified: Secondary | ICD-10-CM

## 2014-03-18 DIAGNOSIS — F32A Depression, unspecified: Secondary | ICD-10-CM

## 2014-03-18 DIAGNOSIS — E119 Type 2 diabetes mellitus without complications: Secondary | ICD-10-CM

## 2014-03-18 DIAGNOSIS — R197 Diarrhea, unspecified: Secondary | ICD-10-CM

## 2014-03-18 DIAGNOSIS — E1142 Type 2 diabetes mellitus with diabetic polyneuropathy: Secondary | ICD-10-CM | POA: Diagnosis not present

## 2014-03-18 DIAGNOSIS — K219 Gastro-esophageal reflux disease without esophagitis: Secondary | ICD-10-CM

## 2014-03-18 DIAGNOSIS — Z1321 Encounter for screening for nutritional disorder: Secondary | ICD-10-CM | POA: Diagnosis not present

## 2014-03-18 DIAGNOSIS — F411 Generalized anxiety disorder: Secondary | ICD-10-CM | POA: Diagnosis not present

## 2014-03-18 DIAGNOSIS — F329 Major depressive disorder, single episode, unspecified: Secondary | ICD-10-CM

## 2014-03-18 DIAGNOSIS — Z125 Encounter for screening for malignant neoplasm of prostate: Secondary | ICD-10-CM

## 2014-03-18 DIAGNOSIS — J4520 Mild intermittent asthma, uncomplicated: Secondary | ICD-10-CM

## 2014-03-18 DIAGNOSIS — E785 Hyperlipidemia, unspecified: Secondary | ICD-10-CM | POA: Diagnosis not present

## 2014-03-18 DIAGNOSIS — I10 Essential (primary) hypertension: Secondary | ICD-10-CM | POA: Diagnosis not present

## 2014-03-18 LAB — POCT GLYCOSYLATED HEMOGLOBIN (HGB A1C): Hemoglobin A1C: 10.9

## 2014-03-18 LAB — GLUCOSE, POCT (MANUAL RESULT ENTRY): POC Glucose: 303 mg/dl — AB (ref 70–99)

## 2014-03-18 MED ORDER — ZOLPIDEM TARTRATE 10 MG PO TABS: 10.0000 mg | ORAL_TABLET | Freq: Every evening | ORAL | Status: DC | PRN

## 2014-03-18 NOTE — Addendum Note (Signed)
Addended by: Jannifer RodneyHAWKS, Izaak Sahr A on: 03/18/2014 12:13 PM   Modules accepted: Orders

## 2014-03-18 NOTE — Progress Notes (Signed)
Lab only 

## 2014-03-19 LAB — PSA, TOTAL AND FREE
PSA, Free Pct: 40 %
PSA, Free: 0.04 ng/mL
PSA: 0.1 ng/mL (ref 0.0–4.0)

## 2014-03-19 LAB — CMP14+EGFR
A/G RATIO: 1.6 (ref 1.1–2.5)
ALT: 25 IU/L (ref 0–44)
AST: 26 IU/L (ref 0–40)
Albumin: 3.9 g/dL (ref 3.5–5.5)
Alkaline Phosphatase: 100 IU/L (ref 39–117)
BUN/Creatinine Ratio: 19 (ref 9–20)
BUN: 14 mg/dL (ref 6–24)
CO2: 26 mmol/L (ref 18–29)
Calcium: 9.5 mg/dL (ref 8.7–10.2)
Chloride: 97 mmol/L (ref 97–108)
Creatinine, Ser: 0.75 mg/dL — ABNORMAL LOW (ref 0.76–1.27)
GFR calc non Af Amer: 109 mL/min/{1.73_m2} (ref >59)
GFR, EST AFRICAN AMERICAN: 126 mL/min/{1.73_m2} (ref >59)
GLOBULIN, TOTAL: 2.5 g/dL (ref 1.5–4.5)
GLUCOSE: 314 mg/dL — AB (ref 65–99)
Potassium: 4.5 mmol/L (ref 3.5–5.2)
Sodium: 137 mmol/L (ref 134–144)
Total Bilirubin: <0.2 mg/dL (ref 0.0–1.2)
Total Protein: 6.4 g/dL (ref 6.0–8.5)

## 2014-03-19 LAB — LIPID PANEL
CHOL/HDL RATIO: 5.3 ratio — AB (ref 0.0–5.0)
Cholesterol, Total: 213 mg/dL — ABNORMAL HIGH (ref 100–199)
HDL: 40 mg/dL (ref >39)
LDL CALC: 116 mg/dL — AB (ref 0–99)
Triglycerides: 287 mg/dL — ABNORMAL HIGH (ref 0–149)
VLDL Cholesterol Cal: 57 mg/dL — ABNORMAL HIGH (ref 5–40)

## 2014-03-19 LAB — VITAMIN D 25 HYDROXY (VIT D DEFICIENCY, FRACTURES): VIT D 25 HYDROXY: 13.6 ng/mL — AB (ref 30.0–100.0)

## 2014-03-19 LAB — THYROID PANEL WITH TSH
Free Thyroxine Index: 1.6 (ref 1.2–4.9)
T3 Uptake Ratio: 32 % (ref 24–39)
T4, Total: 5 ug/dL (ref 4.5–12.0)
TSH: 6.67 u[IU]/mL — AB (ref 0.450–4.500)

## 2014-03-20 ENCOUNTER — Telehealth: Payer: Self-pay | Admitting: Family

## 2014-03-20 ENCOUNTER — Other Ambulatory Visit: Payer: Self-pay | Admitting: Family

## 2014-03-20 MED ORDER — LEVOTHYROXINE SODIUM 125 MCG PO TABS: 125.0000 ug | ORAL_TABLET | Freq: Every day | ORAL | Status: DC

## 2014-03-20 MED ORDER — VITAMIN D (ERGOCALCIFEROL) 1.25 MG (50000 UNIT) PO CAPS: 50000.0000 [IU] | ORAL_CAPSULE | ORAL | Status: DC

## 2014-03-20 MED ORDER — ATORVASTATIN CALCIUM 80 MG PO TABS: 80.0000 mg | ORAL_TABLET | Freq: Every day | ORAL | Status: DC

## 2014-03-20 NOTE — Telephone Encounter (Signed)
-----   Message from Junie Spencerhristy A [name], FNP sent at 03/20/2014 11:10 AM EST ----- HbgA1C and glucose is very HIGH- Pt needs to be on low carb diet- Pt started on new medication at visit- Cont this medication Kidney and liver function stable Cholesterol levels elevated- Lipitor increased to 80 mg daily- New rx sent to pharmacy Vit D levels low- RX sent to pharmacy Thyroid levels abnormal- Levothyroxine increased to 125mcg - New rx sent to pharmacy Pt needs to been seen within next month to discuss medication and have blood work redrawn PSA levels WNL

## 2014-03-22 NOTE — Telephone Encounter (Signed)
Pt aware of lab results and rx's at pharmacy °

## 2014-03-25 ENCOUNTER — Encounter: Payer: Self-pay | Admitting: Family

## 2014-03-25 NOTE — Telephone Encounter (Signed)
-----   Message from Junie Spencerhristy A [name], FNP sent at 03/20/2014 11:10 AM EST ----- HbgA1C and glucose is very HIGH- Pt needs to be on low carb diet- Pt started on new medication at visit- Cont this medication Kidney and liver function stable Cholesterol levels elevated- Lipitor increased to 80 mg daily- New rx sent to pharmacy Vit D levels low- RX sent to pharmacy Thyroid levels abnormal- Levothyroxine increased to 125mcg - New rx sent to pharmacy Pt needs to been seen within next month to discuss medication and have blood work redrawn PSA levels WNL

## 2014-03-27 ENCOUNTER — Encounter: Payer: Self-pay | Admitting: Cardiovascular Disease

## 2014-04-08 ENCOUNTER — Other Ambulatory Visit: Payer: Self-pay | Admitting: Family

## 2014-04-11 ENCOUNTER — Ambulatory Visit (INDEPENDENT_AMBULATORY_CARE_PROVIDER_SITE_OTHER): Payer: Medicare Other | Admitting: Orthopedic Surgery

## 2014-04-11 ENCOUNTER — Encounter: Payer: Self-pay | Admitting: Orthopedic Surgery

## 2014-04-11 ENCOUNTER — Ambulatory Visit (INDEPENDENT_AMBULATORY_CARE_PROVIDER_SITE_OTHER): Payer: Medicare Other

## 2014-04-11 VITALS — BP 129/93 | Ht 68.0 in | Wt 259.0 lb

## 2014-04-11 DIAGNOSIS — S62501D Fracture of unspecified phalanx of right thumb, subsequent encounter for fracture with routine healing: Secondary | ICD-10-CM

## 2014-04-11 DIAGNOSIS — S63641D Sprain of metacarpophalangeal joint of right thumb, subsequent encounter: Secondary | ICD-10-CM | POA: Diagnosis not present

## 2014-04-11 MED ORDER — HYDROCODONE-ACETAMINOPHEN 5-325 MG PO TABS: 1.0000 | ORAL_TABLET | Freq: Four times a day (QID) | ORAL | Status: DC | PRN

## 2014-04-11 NOTE — Addendum Note (Signed)
Addended by: Debby BudLONG, Roland Prine R on: 04/11/2014 11:41 AM   Modules accepted: Orders

## 2014-04-11 NOTE — Progress Notes (Signed)
Chief Complaint  Patient presents with  . Follow-up    6 week recheck on right thumb with xray.   The patient does not have a fracture he has an ulnar collateral ligament injury and he was treated with a brace and is now stable.  UR brace for 6 weeks  Review of systems his rib fracture is causing him no symptoms other than some pain but is not having any shortness of breath  BP 129/93 mmHg  Ht 5\' 8"  (1.727 m)  Wt 259 lb (117.482 kg)  BMI 39.39 kg/m2 He is awake alert and oriented 3 mood and affect normal Gen. appearance well-groomed  He stable with tenderness over the ulnar collateral ligament range of motion is return to normal skin is intact capillary refill excellent sensation normal  Ulnar collateral ligament sprain  Remove brace come back 6 weeks  Patient's medication change to 5 mg every 6 #60 no refills

## 2014-04-13 ENCOUNTER — Telehealth: Payer: Self-pay | Admitting: Family

## 2014-04-15 NOTE — Telephone Encounter (Signed)
rx called into pharmacy

## 2014-04-15 NOTE — Telephone Encounter (Signed)
Looks like it was done 03/18/14 for #90, but did it ever get called in?

## 2014-04-15 NOTE — Telephone Encounter (Signed)
Prescription sent to pharmacy.

## 2014-04-16 ENCOUNTER — Emergency Department (HOSPITAL_COMMUNITY): Payer: Medicare Other

## 2014-04-16 ENCOUNTER — Emergency Department (HOSPITAL_COMMUNITY)
Admission: EM | Admit: 2014-04-16 | Discharge: 2014-04-16 | Disposition: A | Discharge status: Home / Self Care | Payer: Medicare Other | Attending: Emergency Medicine | Admitting: Emergency Medicine

## 2014-04-16 ENCOUNTER — Encounter (HOSPITAL_COMMUNITY): Payer: Self-pay | Admitting: Emergency Medicine

## 2014-04-16 DIAGNOSIS — I10 Essential (primary) hypertension: Secondary | ICD-10-CM | POA: Insufficient documentation

## 2014-04-16 DIAGNOSIS — F419 Anxiety disorder, unspecified: Secondary | ICD-10-CM | POA: Diagnosis not present

## 2014-04-16 DIAGNOSIS — F1110 Opioid abuse, uncomplicated: Secondary | ICD-10-CM | POA: Insufficient documentation

## 2014-04-16 DIAGNOSIS — K4020 Bilateral inguinal hernia, without obstruction or gangrene, not specified as recurrent: Secondary | ICD-10-CM | POA: Diagnosis not present

## 2014-04-16 DIAGNOSIS — Z794 Long term (current) use of insulin: Secondary | ICD-10-CM | POA: Insufficient documentation

## 2014-04-16 DIAGNOSIS — R079 Chest pain, unspecified: Secondary | ICD-10-CM | POA: Diagnosis not present

## 2014-04-16 DIAGNOSIS — E780 Pure hypercholesterolemia: Secondary | ICD-10-CM | POA: Diagnosis not present

## 2014-04-16 DIAGNOSIS — R05 Cough: Secondary | ICD-10-CM | POA: Diagnosis not present

## 2014-04-16 DIAGNOSIS — Z720 Tobacco use: Secondary | ICD-10-CM | POA: Insufficient documentation

## 2014-04-16 DIAGNOSIS — J45901 Unspecified asthma with (acute) exacerbation: Secondary | ICD-10-CM | POA: Insufficient documentation

## 2014-04-16 DIAGNOSIS — R1011 Right upper quadrant pain: Secondary | ICD-10-CM | POA: Diagnosis not present

## 2014-04-16 DIAGNOSIS — Z79899 Other long term (current) drug therapy: Secondary | ICD-10-CM | POA: Diagnosis not present

## 2014-04-16 DIAGNOSIS — R0789 Other chest pain: Secondary | ICD-10-CM | POA: Insufficient documentation

## 2014-04-16 DIAGNOSIS — J45909 Unspecified asthma, uncomplicated: Secondary | ICD-10-CM | POA: Diagnosis not present

## 2014-04-16 DIAGNOSIS — K3189 Other diseases of stomach and duodenum: Secondary | ICD-10-CM | POA: Diagnosis not present

## 2014-04-16 DIAGNOSIS — R0602 Shortness of breath: Secondary | ICD-10-CM | POA: Diagnosis not present

## 2014-04-16 DIAGNOSIS — Z8673 Personal history of transient ischemic attack (TIA), and cerebral infarction without residual deficits: Secondary | ICD-10-CM | POA: Diagnosis not present

## 2014-04-16 DIAGNOSIS — R739 Hyperglycemia, unspecified: Secondary | ICD-10-CM

## 2014-04-16 DIAGNOSIS — E669 Obesity, unspecified: Secondary | ICD-10-CM | POA: Insufficient documentation

## 2014-04-16 DIAGNOSIS — E1165 Type 2 diabetes mellitus with hyperglycemia: Secondary | ICD-10-CM | POA: Diagnosis not present

## 2014-04-16 DIAGNOSIS — E039 Hypothyroidism, unspecified: Secondary | ICD-10-CM | POA: Diagnosis not present

## 2014-04-16 LAB — CBC WITH DIFFERENTIAL/PLATELET
Basophils Absolute: 0.1 10*3/uL (ref 0.0–0.1)
Basophils Relative: 1 % (ref 0–1)
EOS ABS: 0.2 10*3/uL (ref 0.0–0.7)
Eosinophils Relative: 3 % (ref 0–5)
HCT: 45.9 % (ref 39.0–52.0)
Hemoglobin: 15.7 g/dL (ref 13.0–17.0)
LYMPHS ABS: 2.8 10*3/uL (ref 0.7–4.0)
LYMPHS PCT: 35 % (ref 12–46)
MCH: 32.4 pg (ref 26.0–34.0)
MCHC: 34.2 g/dL (ref 30.0–36.0)
MCV: 94.6 fL (ref 78.0–100.0)
Monocytes Absolute: 0.4 10*3/uL (ref 0.1–1.0)
Monocytes Relative: 5 % (ref 3–12)
NEUTROS PCT: 56 % (ref 43–77)
Neutro Abs: 4.5 10*3/uL (ref 1.7–7.7)
PLATELETS: 197 10*3/uL (ref 150–400)
RBC: 4.85 MIL/uL (ref 4.22–5.81)
RDW: 12.7 % (ref 11.5–15.5)
WBC: 8 10*3/uL (ref 4.0–10.5)

## 2014-04-16 LAB — I-STAT CHEM 8, ED
BUN: 12 mg/dL (ref 6–23)
CHLORIDE: 95 mmol/L — AB (ref 96–112)
Calcium, Ion: 1.1 mmol/L — ABNORMAL LOW (ref 1.12–1.23)
Creatinine, Ser: 0.9 mg/dL (ref 0.50–1.35)
GLUCOSE: 367 mg/dL — AB (ref 70–99)
HCT: 48 % (ref 39.0–52.0)
Hemoglobin: 16.3 g/dL (ref 13.0–17.0)
Potassium: 4.1 mmol/L (ref 3.5–5.1)
Sodium: 136 mmol/L (ref 135–145)
TCO2: 26 mmol/L (ref 0–100)

## 2014-04-16 LAB — URINE MICROSCOPIC-ADD ON

## 2014-04-16 LAB — URINALYSIS, ROUTINE W REFLEX MICROSCOPIC
Bilirubin Urine: NEGATIVE
Glucose, UA: >1000 mg/dL — AB
HGB URINE DIPSTICK: NEGATIVE
Ketones, ur: NEGATIVE mg/dL
Leukocytes, UA: NEGATIVE
NITRITE: NEGATIVE
Protein, ur: NEGATIVE mg/dL
Urobilinogen, UA: 0.2 mg/dL (ref 0.0–1.0)
pH: 6.5 (ref 5.0–8.0)

## 2014-04-16 LAB — HEPATIC FUNCTION PANEL
ALBUMIN: 3.7 g/dL (ref 3.5–5.2)
ALT: 40 U/L (ref 0–53)
AST: 29 U/L (ref 0–37)
Alkaline Phosphatase: 103 U/L (ref 39–117)
BILIRUBIN TOTAL: 0.7 mg/dL (ref 0.3–1.2)
Bilirubin, Direct: 0.1 mg/dL (ref 0.0–0.5)
Indirect Bilirubin: 0.6 mg/dL (ref 0.3–0.9)
TOTAL PROTEIN: 6.8 g/dL (ref 6.0–8.3)

## 2014-04-16 LAB — RAPID URINE DRUG SCREEN, HOSP PERFORMED
AMPHETAMINES: NOT DETECTED
Barbiturates: NOT DETECTED
Benzodiazepines: NOT DETECTED
Cocaine: NOT DETECTED
OPIATES: POSITIVE — AB
Tetrahydrocannabinol: NOT DETECTED

## 2014-04-16 LAB — LIPASE, BLOOD: LIPASE: 22 U/L (ref 11–59)

## 2014-04-16 LAB — ETHANOL

## 2014-04-16 MED ORDER — HYDROCODONE-ACETAMINOPHEN 5-325 MG PO TABS: 1.0000 | ORAL_TABLET | ORAL | Status: DC | PRN

## 2014-04-16 MED ORDER — KETOROLAC TROMETHAMINE 30 MG/ML IJ SOLN
30.0000 mg | Freq: Once | INTRAMUSCULAR | Status: AC
  Administered 2014-04-16: 30 mg via INTRAVENOUS
  Filled 2014-04-16: qty 1

## 2014-04-16 MED ORDER — IOHEXOL 300 MG/ML  SOLN
100.0000 mL | Freq: Once | INTRAMUSCULAR | Status: AC | PRN
  Administered 2014-04-16: 100 mL via INTRAVENOUS

## 2014-04-16 MED ORDER — SODIUM CHLORIDE 0.9 % IV SOLN
INTRAVENOUS | Status: DC
  Administered 2014-04-16: 12:00:00 via INTRAVENOUS

## 2014-04-16 NOTE — Discharge Instructions (Signed)
Drink plenty of fluids. Standard low carbohydrate diet. Take your insulin, as directed.    Abdominal Pain Many things can cause abdominal pain. Usually, abdominal pain is not caused by a disease and will improve without treatment. It can often be observed and treated at home. Your health care provider will do a physical exam and possibly order blood tests and X-rays to help determine the seriousness of your pain. However, in many cases, more time must pass before a clear cause of the pain can be found. Before that point, your health care provider may not know if you need more testing or further treatment. HOME CARE INSTRUCTIONS  Monitor your abdominal pain for any changes. The following actions may help to alleviate any discomfort you are experiencing:  Only take over-the-counter or prescription medicines as directed by your health care provider.  Do not take laxatives unless directed to do so by your health care provider.  Try a clear liquid diet (broth, tea, or water) as directed by your health care provider. Slowly move to a bland diet as tolerated. SEEK MEDICAL CARE IF:  You have unexplained abdominal pain.  You have abdominal pain associated with nausea or diarrhea.  You have pain when you urinate or have a bowel movement.  You experience abdominal pain that wakes you in the night.  You have abdominal pain that is worsened or improved by eating food.  You have abdominal pain that is worsened with eating fatty foods.  You have a fever. SEEK IMMEDIATE MEDICAL CARE IF:   Your pain does not go away within 2 hours.  You keep throwing up (vomiting).  Your pain is felt only in portions of the abdomen, such as the right side or the left lower portion of the abdomen.  You pass bloody or black tarry stools. MAKE SURE YOU:  Understand these instructions.   Will watch your condition.   Will get help right away if you are not doing well or get worse.  Document Released:  11/11/2004 Document Revised: 02/06/2013 Document Reviewed: 10/11/2012 [facility] Patient Information 2015 [place], [place]. This information is not intended to replace advice given to you by your health care provider. Make sure you discuss any questions you have with your health care provider.  Hyperglycemia Hyperglycemia occurs when the glucose (sugar) in your blood is too high. Hyperglycemia can happen for many reasons, but it most often happens to people who do not know they have diabetes or are not managing their diabetes properly.  CAUSES  Whether you have diabetes or not, there are other causes of hyperglycemia. Hyperglycemia can occur when you have diabetes, but it can also occur in other situations that you might not be as aware of, such as: Diabetes  If you have diabetes and are having problems controlling your blood glucose, hyperglycemia could occur because of some of the following reasons:  Not following your meal plan.  Not taking your diabetes medications or not taking it properly.  Exercising less or doing less activity than you normally do.  Being sick. Pre-diabetes  This cannot be ignored. Before people develop Type 2 diabetes, they almost always have "pre-diabetes." This is when your blood glucose levels are higher than normal, but not yet high enough to be diagnosed as diabetes. Research has shown that some long-term damage to the body, especially the heart and circulatory system, may already be occurring during pre-diabetes. If you take action to manage your blood glucose when you have pre-diabetes, you may delay or prevent  Type 2 diabetes from developing. Stress  If you have diabetes, you may be "diet" controlled or on oral medications or insulin to control your diabetes. However, you may find that your blood glucose is higher than usual in the hospital whether you have diabetes or not. This is often referred to as "stress hyperglycemia." Stress can elevate your blood  glucose. This happens because of hormones put out by the body during times of stress. If stress has been the cause of your high blood glucose, it can be followed regularly by your caregiver. That way he/she can make sure your hyperglycemia does not continue to get worse or progress to diabetes. Steroids  Steroids are medications that act on the infection fighting system (immune system) to block inflammation or infection. One side effect can be a rise in blood glucose. Most people can produce enough extra insulin to allow for this rise, but for those who cannot, steroids make blood glucose levels go even higher. It is not unusual for steroid treatments to "uncover" diabetes that is developing. It is not always possible to determine if the hyperglycemia will go away after the steroids are stopped. A special blood test called an A1c is sometimes done to determine if your blood glucose was elevated before the steroids were started. SYMPTOMS  Thirsty.  Frequent urination.  Dry mouth.  Blurred vision.  Tired or fatigue.  Weakness.  Sleepy.  Tingling in feet or leg. DIAGNOSIS  Diagnosis is made by monitoring blood glucose in one or all of the following ways:  A1c test. This is a chemical found in your blood.  Fingerstick blood glucose monitoring.  Laboratory results. TREATMENT  First, knowing the cause of the hyperglycemia is important before the hyperglycemia can be treated. Treatment may include, but is not be limited to:  Education.  Change or adjustment in medications.  Change or adjustment in meal plan.  Treatment for an illness, infection, etc.  More frequent blood glucose monitoring.  Change in exercise plan.  Decreasing or stopping steroids.  Lifestyle changes. HOME CARE INSTRUCTIONS   Test your blood glucose as directed.  Exercise regularly. Your caregiver will give you instructions about exercise. Pre-diabetes or diabetes which comes on with stress is helped by  exercising.  Eat wholesome, balanced meals. Eat often and at regular, fixed times. Your caregiver or nutritionist will give you a meal plan to guide your sugar intake.  Being at an ideal weight is important. If needed, losing as little as 10 to 15 pounds may help improve blood glucose levels. SEEK MEDICAL CARE IF:   You have questions about medicine, activity, or diet.  You continue to have symptoms (problems such as increased thirst, urination, or weight gain). SEEK IMMEDIATE MEDICAL CARE IF:   You are vomiting or have diarrhea.  Your breath smells fruity.  You are breathing faster or slower.  You are very sleepy or incoherent.  You have numbness, tingling, or pain in your feet or hands.  You have chest pain.  Your symptoms get worse even though you have been following your caregiver's orders.  If you have any other questions or concerns. Document Released: 07/28/2000 Document Revised: 04/26/2011 Document Reviewed: 05/31/2011 [facility] Patient Information 2015 [place], [place]. This information is not intended to replace advice given to you by your health care provider. Make sure you discuss any questions you have with your health care provider.

## 2014-04-16 NOTE — ED Provider Notes (Addendum)
CSN: [id number]     Arrival date & time 04/16/14  0941 History  This chart was scribe for Flint Melter, MD by [name], ED Scribe. The patient was seen in room APA11/APA11 and the patient's care was started at 11:19 AM.    Chief Complaint  Patient presents with  . Abdominal Pain   The history is provided by the patient. No language interpreter was used.  HPI Comments: Charles Pennington is a 49 y.o. male who presents to the Emergency Department complaining of gradually worsening RUQ pain onset a few weeks ago. He reports that he had a fall a few months ago and had a rib injury. He denies that he is weak from a previous stroke. He reports associated cough. He adds that the pain is increased no matter what he does, whether he is sitting or laying down.  He denies vomiting, diarrhea. He states that he smokes about half a pack a day.  Past Medical History  Diagnosis Date  . Diabetes mellitus   . Obesity   . Hypertension   . Hypothyroidism   . Vertigo   . Low back pain   . SOB (shortness of breath)   . Asthma   . Hypercholesteremia   . Anxiety   . Stroke age 42   Past Surgical History  Procedure Laterality Date  . Wrist surgery    . Spine surgery    . Foot surgery      Dr Ulice Brilliant   Family History  Problem Relation Age of Onset  . Hypertension Father   . Suicidality Father   . Diabetes Father   . Diabetes Mother    History  Substance Use Topics  . Smoking status: Current Every Day Smoker -- 0.50 packs/day    Types: Cigarettes  . Smokeless tobacco: Never Used  . Alcohol Use: Yes     Comment: occasional per pt    Review of Systems  Respiratory: Positive for cough.   Gastrointestinal: Positive for abdominal pain (RUQ). Negative for nausea, vomiting and diarrhea.  All other systems reviewed and are negative.     Allergies  Hydrocodone-acetaminophen  Home Medications   Prior to Admission medications   Medication Sig Start Date End Date Taking? Authorizing  Provider  albuterol (PROVENTIL HFA;VENTOLIN HFA) 108 (90 BASE) MCG/ACT inhaler Inhale 2 puffs into the lungs every 6 (six) hours as needed for wheezing. 07/19/13  Yes Junie Spencer, FNP  ALPRAZolam Prudy Feeler) 0.5 MG tablet Take 1 tablet (0.5 mg total) by mouth 3 (three) times daily. 03/15/14  Yes [name] [name], FNP  amLODipine (NORVASC) 5 MG tablet TAKE 1 TABLET (5 MG TOTAL) BY MOUTH DAILY. 04/09/14  Yes Junie Spencer, FNP  atorvastatin (LIPITOR) 80 MG tablet Take 1 tablet (80 mg total) by mouth daily. 03/20/14  Yes Junie Spencer, FNP  Canagliflozin-Metformin HCl 50-1000 MG TABS Take 1 tablet by mouth 2 (two) times daily with a meal. 03/15/14  Yes Junie Spencer, FNP  fenofibrate 160 MG tablet Take 1 tablet (160 mg total) by mouth daily. 07/19/13  Yes Junie Spencer, FNP  gabapentin (NEURONTIN) 600 MG tablet Take 600 mg by mouth 2 (two) times daily.   Yes Historical Provider, MD  Insulin Glargine (LANTUS SOLOSTAR) 100 UNIT/ML Solostar Pen INJECT 45 UNITS INTO THE SKIN 2 (TWO) TIMES DAILY. 02/12/14  Yes Mary-Margaret Daphine Deutscher, FNP  levothyroxine (SYNTHROID, LEVOTHROID) 112 MCG tablet Take 112 mcg by mouth daily before breakfast.   Yes Historical Provider,  MD  LYRICA 50 MG capsule Take 50 mg by mouth 2 (two) times daily. 02/14/14  Yes Historical Provider, MD  omeprazole (PRILOSEC) 20 MG capsule TAKE 1 CAPSULE (20 MG TOTAL) BY MOUTH DAILY. 04/09/14  Yes Junie Spencer, FNP  PARoxetine (PAXIL) 40 MG tablet Take 1 tablet (40 mg total) by mouth daily. 07/19/13  Yes Junie Spencer, FNP  Vitamin D, Ergocalciferol, (DRISDOL) 50000 UNITS CAPS capsule Take 1 capsule (50,000 Units total) by mouth every 7 (seven) days. 03/20/14  Yes Junie Spencer, FNP  zolpidem (AMBIEN) 10 MG tablet TAKE 1 TABLET BY MOUTH AT BEDTIME AS NEEDED Patient taking differently: TAKE 1 TABLET BY MOUTH AT BEDTIME AS NEEDED SLEEP 04/15/14  Yes Junie Spencer, FNP  Glucose Blood (BLOOD GLUCOSE TEST STRIPS) STRP Use to check BG up to BID.  Dx:   250.02 08/15/13   Henrene Pastor, PHARMD  HYDROcodone-acetaminophen (NORCO) 5-325 MG per tablet Take 1 tablet by mouth every 4 (four) hours as needed. 04/16/14   Flint Melter, MD  LANTUS SOLOSTAR 100 UNIT/ML Solostar Pen INJECT 45 UNITS INTO THE SKIN 2 (TWO) TIMES DAILY. Patient not taking: Reported on 04/16/2014 04/09/14   Junie Spencer, FNP  levothyroxine (SYNTHROID, LEVOTHROID) 125 MCG tablet Take 1 tablet (125 mcg total) by mouth daily. Patient not taking: Reported on 04/16/2014 03/20/14   Junie Spencer, FNP   BP 134/98 mmHg  Pulse 78  Temp(Src) 98 F (36.7 C) (Oral)  Resp 18  Ht 5\' 8"  (1.727 m)  Wt 260 lb (117.935 kg)  BMI 39.54 kg/m2  SpO2 98% Physical Exam  Constitutional: He is oriented to person, place, and time. He appears well-developed and well-nourished.  HENT:  Head: Normocephalic and atraumatic.  Right Ear: External ear normal.  Left Ear: External ear normal.  Eyes: Conjunctivae and EOM are normal. Pupils are equal, round, and reactive to light.  Neck: Normal range of motion and phonation normal. Neck supple.  Cardiovascular: Normal rate, regular rhythm and normal heart sounds.   Pulmonary/Chest: Effort normal. He has wheezes. He exhibits tenderness. He exhibits no bony tenderness.  Scattered bronchi wheezes, good air movement Moderate RU chest wall tenderness, no crepitation   Abdominal: Soft. There is tenderness.  Moderate RUQ tenderness  Musculoskeletal: Normal range of motion.  Neurological: He is alert and oriented to person, place, and time. No cranial nerve deficit or sensory deficit. He exhibits normal muscle tone. Coordination normal.  Skin: Skin is warm, dry and intact.  Psychiatric: He has a normal mood and affect. His behavior is normal. Judgment and thought content normal.  Nursing note and vitals reviewed.   ED Course  Procedures (including critical care time) DIAGNOSTIC STUDIES: Oxygen Saturation is 94% on RA, low by my interpretation.    COORDINATION  OF CARE: Medications  0.9 %  sodium chloride infusion ( Intravenous New Bag/Given 04/16/14 1156)  ketorolac (TORADOL) 30 MG/ML injection 30 mg (30 mg Intravenous Given 04/16/14 1153)  iohexol (OMNIPAQUE) 300 MG/ML solution 100 mL (100 mLs Intravenous Contrast Given 04/16/14 1212)    Patient Vitals for the past 24 hrs:  BP Temp Temp src Pulse Resp SpO2 Height Weight  04/16/14 1137 134/98 mmHg 98 F (36.7 C) Oral 78 18 98 % - -  04/16/14 1030 129/88 mmHg - - 94 - 95 % - -  04/16/14 0952 (!) 131/108 mmHg 98.2 F (36.8 C) Oral 105 18 94 % 5\' 8"  (1.727 m) 260 lb (117.935 kg)    11:25  AM- Pt advised of plan for treatment and pt agrees.    1:50 PM Reevaluation with update and discussion. After initial assessment and treatment, an updated evaluation reveals he is more comfortable at this time. He admits to being noncompliant with his insulin, and low carbohydrate diet. Findings discussed with patient and family member, all questions answered. Shyne Lehrke L      Labs Review Labs Reviewed  URINALYSIS, ROUTINE W REFLEX MICROSCOPIC - Abnormal; Notable for the following:    Specific Gravity, Urine <1.005 (*)    Glucose, UA >1000 (*)    All other components within normal limits  URINE RAPID DRUG SCREEN (HOSP PERFORMED) - Abnormal; Notable for the following:    Opiates POSITIVE (*)    All other components within normal limits  I-STAT CHEM 8, ED - Abnormal; Notable for the following:    Chloride 95 (*)    Glucose, Bld 367 (*)    Calcium, Ion 1.10 (*)    All other components within normal limits  CBC WITH DIFFERENTIAL/PLATELET  HEPATIC FUNCTION PANEL  LIPASE, BLOOD  ETHANOL  URINE MICROSCOPIC-ADD ON    Imaging Review Dg Chest 2 View  04/16/2014   CLINICAL DATA:  Shortness of breath, cough and RIGHT anterior lower chest pain for 1 month which is worsening, fell 3-4 months ago with RIGHT chest wall trauma, history hypertension, diabetes, smoker, asthma  EXAM: CHEST  2 VIEW  COMPARISON:   02/24/2014  FINDINGS: Normal heart size, mediastinal contours and pulmonary vascularity.  Increased bronchitic changes.  No acute infiltrate, pleural effusion or pneumothorax.  No acute osseous findings.  IMPRESSION: Mild bronchitic changes increased since previous exam.   Electronically Signed   By: Ulyses SouthwardMark  Boles M.D.   On: 04/16/2014 10:51   Ct Abdomen Pelvis W Contrast  04/16/2014   CLINICAL DATA:  Right upper quadrant pain for 3 months. Worsened last month. Cough. Diabetes.  EXAM: CT ABDOMEN AND PELVIS WITH CONTRAST  TECHNIQUE: Multidetector CT imaging of the abdomen and pelvis was performed using the standard protocol following bolus administration of intravenous contrast.  CONTRAST:  100mL OMNIPAQUE IOHEXOL 300 MG/ML  SOLN  COMPARISON:  01/01/2008  FINDINGS: Their wsre issues with the patient's IV. Therefore, the initial study was repeated.  Lower chest: Clear lung bases. Normal heart size without pericardial or pleural effusion.  Hepatobiliary: Normal liver. Normal gallbladder, without biliary ductal dilatation.  Pancreas: Mild pancreatic atrophy, without mass or ductal dilatation. A periampullary duodenal diverticulum is incidentally noted.  Spleen: Normal  Adrenals/Urinary Tract: Normal adrenal glands. Normal kidneys, without hydronephrosis. Normal urinary bladder.  Stomach/Bowel: The proximal gastric wall is mildly prominent, including at 1.9 cm along the greater curvature. Image 21 of series 7. Equivocal fold thickening at the gastric fundus. Contrast and/or calcification identified within a rectosigmoid diverticulum on image 81. No surrounding inflammation. Colonic stool burden suggests constipation. Normal terminal ileum and appendix. Normal small bowel.  Vascular/Lymphatic: Normal caliber of the aorta and branch vessels. No abdominopelvic adenopathy.  Reproductive: Normal prostate.  Other: No significant free fluid. Bilateral fat containing inguinal hernias are moderate.  Musculoskeletal: Trans pedicle  screw fixation at L3-4.  IMPRESSION: 1. Equivocal gastric wall and fold thickening. At least partially felt to be due data underdistention. Correlate with symptoms to suggest gastritis. 2.  Possible constipation. 3. No other explanation for right upper quadrant pain. 4. Technical limitations, as detailed above. 5. Fat containing inguinal hernias.   Electronically Signed   By: Jeronimo GreavesKyle  Talbot M.D.   On: 04/16/2014 12:59  EKG Interpretation None      MDM   Final diagnoses:  Right upper quadrant pain  Hyperglycemia      Nonspecific right upper quadrant, and right lower chest wall pain. Doubt gallbladder disease, urinary tract infection, DKA or serious bacterial infection. No evidence for traumatic injury to the abdomen or lower chest wall.   Nursing Notes Reviewed/ Care Coordinated Applicable Imaging Reviewed Interpretation of Laboratory Data incorporated into ED treatment  The patient appears reasonably screened and/or stabilized for discharge and I doubt any other medical condition or other [facility] requiring further screening, evaluation, or treatment in the ED at this time prior to discharge.  Plan: Home Medications- Norco; Home Treatments- Fluids, low carb diet; return here if the recommended treatment, does not improve the symptoms; Recommended follow up- PCP 1 week   I personally performed the services described in this documentation, which was scribed in my presence. The recorded information has been reviewed and is accurate.     Flint Melter, MD 04/16/14 1404   15:25- . I received a call from a pharmacist, who stated that the patient had a prescription for hydrocodone filled, 5 days ago, #60 (sig. q 6 hours). I asked her to destroy the Norco prescription that I wrote today.    Flint Melter, MD 04/16/14 [phone]

## 2014-04-16 NOTE — ED Notes (Signed)
Pt states that he had a rib injury on the right side a few months ago and a few weeks ago started hurting in RUQ with increased pain with coughing or breathing.

## 2014-04-17 ENCOUNTER — Ambulatory Visit (INDEPENDENT_AMBULATORY_CARE_PROVIDER_SITE_OTHER): Payer: Medicare Other | Admitting: Family

## 2014-04-17 ENCOUNTER — Encounter: Payer: Self-pay | Admitting: Family

## 2014-04-17 VITALS — BP 133/96 | HR 83 | Temp 97.7°F | Ht 68.0 in | Wt 266.2 lb

## 2014-04-17 DIAGNOSIS — E1140 Type 2 diabetes mellitus with diabetic neuropathy, unspecified: Secondary | ICD-10-CM | POA: Diagnosis not present

## 2014-04-17 DIAGNOSIS — G4700 Insomnia, unspecified: Secondary | ICD-10-CM

## 2014-04-17 DIAGNOSIS — E039 Hypothyroidism, unspecified: Secondary | ICD-10-CM

## 2014-04-17 DIAGNOSIS — E1142 Type 2 diabetes mellitus with diabetic polyneuropathy: Secondary | ICD-10-CM | POA: Diagnosis not present

## 2014-04-17 DIAGNOSIS — R0781 Pleurodynia: Secondary | ICD-10-CM | POA: Diagnosis not present

## 2014-04-17 LAB — POCT GLYCOSYLATED HEMOGLOBIN (HGB A1C): Hemoglobin A1C: 11.6

## 2014-04-17 LAB — GLUCOSE, POCT (MANUAL RESULT ENTRY): POC Glucose: 279 mg/dl — AB (ref 70–99)

## 2014-04-17 MED ORDER — KETOROLAC TROMETHAMINE 60 MG/2ML IM SOLN
60.0000 mg | Freq: Once | INTRAMUSCULAR | Status: AC
  Administered 2014-04-17: 60 mg via INTRAMUSCULAR

## 2014-04-17 MED ORDER — ZOLPIDEM TARTRATE 10 MG PO TABS: ORAL_TABLET | ORAL | Status: DC

## 2014-04-17 MED ORDER — CANAGLIFLOZIN-METFORMIN HCL 150-1000 MG PO TABS: 1.0000 | ORAL_TABLET | Freq: Two times a day (BID) | ORAL | Status: DC

## 2014-04-17 NOTE — Patient Instructions (Signed)
Type 2 Diabetes Mellitus Type 2 diabetes mellitus, often simply referred to as type 2 diabetes, is a long-lasting (chronic) disease. In type 2 diabetes, the pancreas does not make enough insulin (a hormone), the cells are less responsive to the insulin that is made (insulin resistance), or both. Normally, insulin moves sugars from food into the tissue cells. The tissue cells use the sugars for energy. The lack of insulin or the lack of normal response to insulin causes excess sugars to build up in the blood instead of going into the tissue cells. As a result, high blood sugar (hyperglycemia) develops. The effect of high sugar (glucose) levels can cause many complications. Type 2 diabetes was also previously called adult-onset diabetes, but it can occur at any age.  RISK FACTORS  A person is predisposed to developing type 2 diabetes if someone in the family has the disease and also has one or more of the following primary risk factors:  Overweight.  An inactive lifestyle.  A history of consistently eating high-calorie foods. Maintaining a normal weight and regular physical activity can reduce the chance of developing type 2 diabetes. SYMPTOMS  A person with type 2 diabetes may not show symptoms initially. The symptoms of type 2 diabetes appear slowly. The symptoms include:  Increased thirst (polydipsia).  Increased urination (polyuria).  Increased urination during the night (nocturia).  Weight loss. This weight loss may be rapid.  Frequent, recurring infections.  Tiredness (fatigue).  Weakness.  Vision changes, such as blurred vision.  Fruity smell to your breath.  Abdominal pain.  Nausea or vomiting.  Cuts or bruises which are slow to heal.  Tingling or numbness in the hands or feet. DIAGNOSIS Type 2 diabetes is frequently not diagnosed until complications of diabetes are present. Type 2 diabetes is diagnosed when symptoms or complications are present and when blood  glucose levels are increased. Your blood glucose level may be checked by one or more of the following blood tests:  A fasting blood glucose test. You will not be allowed to eat for at least 8 hours before a blood sample is taken.  A random blood glucose test. Your blood glucose is checked at any time of the day regardless of when you ate.  A hemoglobin A1c blood glucose test. A hemoglobin A1c test provides information about blood glucose control over the previous 3 months.  An oral glucose tolerance test (OGTT). Your blood glucose is measured after you have not eaten (fasted) for 2 hours and then after you drink a glucose-containing beverage. TREATMENT   You may need to take insulin or diabetes medicine daily to keep blood glucose levels in the desired range.  If you use insulin, you may need to adjust the dosage depending on the carbohydrates that you eat with each meal or snack. The treatment goal is to maintain the before meal blood sugar (preprandial glucose) level at 70-130 mg/dL. HOME CARE INSTRUCTIONS   Have your hemoglobin A1c level checked twice a year.  Perform daily blood glucose monitoring as directed by your health care provider.  Monitor urine ketones when you are ill and as directed by your health care provider.  Take your diabetes medicine or insulin as directed by your health care provider to maintain your blood glucose levels in the desired range.  Never run out of diabetes medicine or insulin. It is needed every day.  If you are using insulin, you may need to adjust the amount of insulin given based on your intake of   carbohydrates. Carbohydrates can raise blood glucose levels but need to be included in your diet. Carbohydrates provide vitamins, minerals, and fiber which are an essential part of a healthy diet. Carbohydrates are found in fruits, vegetables, whole grains, dairy products, legumes, and foods containing added sugars.  Eat healthy foods. You should make an  appointment to see a registered dietitian to help you create an eating plan that is right for you.  Lose weight if you are overweight.  Carry a medical alert card or wear your medical alert jewelry.  Carry a 15-gram carbohydrate snack with you at all times to treat low blood glucose (hypoglycemia). Some examples of 15-gram carbohydrate snacks include:  Glucose tablets, 3 or 4.  Glucose gel, 15-gram tube.  Raisins, 2 tablespoons (24 grams).  Jelly beans, 6.  Animal crackers, 8.  Regular pop, 4 ounces (120 mL).  Gummy treats, 9.  Recognize hypoglycemia. Hypoglycemia occurs with blood glucose levels of 70 mg/dL and below. The risk for hypoglycemia increases when fasting or skipping meals, during or after intense exercise, and during sleep. Hypoglycemia symptoms can include:  Tremors or shakes.  Decreased ability to concentrate.  Sweating.  Increased heart rate.  Headache.  Dry mouth.  Hunger.  Irritability.  Anxiety.  Restless sleep.  Altered speech or coordination.  Confusion.  Treat hypoglycemia promptly. If you are alert and able to safely swallow, follow the 15:15 rule:  Take 15-20 grams of rapid-acting glucose or carbohydrate. Rapid-acting options include glucose gel, glucose tablets, or 4 ounces (120 mL) of fruit juice, regular soda, or low-fat milk.  Check your blood glucose level 15 minutes after taking the glucose.  Take 15-20 grams more of glucose if the repeat blood glucose level is still 70 mg/dL or below.  Eat a meal or snack within 1 hour once blood glucose levels return to normal.  Be alert to feeling very thirsty and urinating more frequently than usual, which are early signs of hyperglycemia. An early awareness of hyperglycemia allows for prompt treatment. Treat hyperglycemia as directed by your health care provider.  Engage in at least 150 minutes of moderate-intensity physical activity a week, spread over at least 3 days of the week or as  directed by your health care provider. In addition, you should engage in resistance exercise at least 2 times a week or as directed by your health care provider. Try to spend no more than 90 minutes at one time inactive.  Adjust your medicine and food intake as needed if you start a new exercise or sport.  Follow your sick-day plan anytime you are unable to eat or drink as usual.  Do not use any tobacco products including cigarettes, chewing tobacco, or electronic cigarettes. If you need help quitting, ask your health care provider.  Limit alcohol intake to no more than 1 drink per day for nonpregnant women and 2 drinks per day for men. You should drink alcohol only when you are also eating food. Talk with your health care provider whether alcohol is safe for you. Tell your health care provider if you drink alcohol several times a week.  Keep all follow-up visits as directed by your health care provider. This is important.  Schedule an eye exam soon after the diagnosis of type 2 diabetes and then annually.  Perform daily skin and foot care. Examine your skin and feet daily for cuts, bruises, redness, nail problems, bleeding, blisters, or sores. A foot exam by a health care provider should be done annually.    Brush your teeth and gums at least twice a day and floss at least once a day. Follow up with your dentist regularly.  Share your diabetes management plan with your workplace or school.  Stay up-to-date with immunizations. It is recommended that people with diabetes who are over 802 years old get the pneumonia vaccine. In some cases, two separate shots may be given. Ask your health care provider if your pneumonia vaccination is up-to-date.  Learn to manage stress.  Obtain ongoing diabetes education and support as needed.  Participate in or seek rehabilitation as needed to maintain or improve independence and quality of life. Request a physical or occupational therapy referral if you are  having foot or hand numbness, or difficulties with grooming, dressing, eating, or physical activity. SEEK MEDICAL CARE IF:   You are unable to eat food or drink fluids for more than 6 hours.  You have nausea and vomiting for more than 6 hours.  Your blood glucose level is over 240 mg/dL.  There is a change in mental status.  You develop an additional serious illness.  You have diarrhea for more than 6 hours.  You have been sick or have had a fever for a couple of days and are not getting better.  You have pain during any physical activity.  SEEK IMMEDIATE MEDICAL CARE IF:  You have difficulty breathing.  You have moderate to large ketone levels. MAKE SURE YOU:  Understand these instructions.  Will watch your condition.  Will get help right away if you are not doing well or get worse. Document Released: 02/01/2005 Document Revised: 06/18/2013 Document Reviewed: 08/31/2011 [facility] Patient Information 2015 [place], [place]. This information is not intended to replace advice given to you by your health care provider. Make sure you discuss any questions you have with your health care provider. Hypothyroidism The thyroid is a large gland located in the lower front of your neck. The thyroid gland helps control metabolism. Metabolism is how your body handles food. It controls metabolism with the hormone thyroxine. When this gland is underactive (hypothyroid), it produces too little hormone.  CAUSES These include:   Absence or destruction of thyroid tissue.  Goiter due to iodine deficiency.  Goiter due to medications.  Congenital defects (since birth).  Problems with the pituitary. This causes a lack of TSH (thyroid stimulating hormone). This hormone tells the thyroid to turn out more hormone. SYMPTOMS  Lethargy (feeling as though you have no energy)  Cold intolerance  Weight gain (in spite of normal food intake)  Dry skin  Coarse hair  Menstrual irregularity (if  severe, may lead to infertility)  Slowing of thought processes Cardiac problems are also caused by insufficient amounts of thyroid hormone. Hypothyroidism in the newborn is cretinism, and is an extreme form. It is important that this form be treated adequately and immediately or it will lead rapidly to retarded physical and mental development. DIAGNOSIS  To prove hypothyroidism, your caregiver may do blood tests and ultrasound tests. Sometimes the signs are hidden. It may be necessary for your caregiver to watch this illness with blood tests either before or after diagnosis and treatment. TREATMENT  Low levels of thyroid hormone are increased by using synthetic thyroid hormone. This is a safe, effective treatment. It usually takes about four weeks to gain the full effects of the medication. After you have the full effect of the medication, it will generally take another four weeks for problems to leave. Your caregiver may start you on low  doses. If you have had heart problems the dose may be gradually increased. It is generally not an emergency to get rapidly to normal. HOME CARE INSTRUCTIONS   Take your medications as your caregiver suggests. Let your caregiver know of any medications you are taking or start taking. Your caregiver will help you with dosage schedules.  As your condition improves, your dosage needs may increase. It will be necessary to have continuing blood tests as suggested by your caregiver.  Report all suspected medication side effects to your caregiver. SEEK MEDICAL CARE IF: Seek medical care if you develop:  Sweating.  Tremulousness (tremors).  Anxiety.  Rapid weight loss.  Heat intolerance.  Emotional swings.  Diarrhea.  Weakness. SEEK IMMEDIATE MEDICAL CARE IF:  You develop chest pain, an irregular heart beat (palpitations), or a rapid heart beat. MAKE SURE YOU:   Understand these instructions.  Will watch your condition.  Will get help right away if  you are not doing well or get worse. Document Released: 02/01/2005 Document Revised: 04/26/2011 Document Reviewed: 09/22/2007 [facility] Patient Information 2015 [place], [place]. This information is not intended to replace advice given to you by your health care provider. Make sure you discuss any questions you have with your health care provider.

## 2014-04-17 NOTE — Progress Notes (Signed)
   Subjective:    Patient ID: Charles Pennington, male    DOB: 10/12/1965, 49 y.o.   MRN: [id number]  HPI Pt presents to the office to recheck lab work for thyroid and diabetes. Pt's levothyroxine increased to 125 mcg. Pt was started on Invokana with metformin on last visit. Pt had CT scan done and had to stop for 48 hours. PT states his blood sugars have been running about 160's. Pt states he is stilling having rib and arm pain, but denies any  headache, palpitations, or edema at this time.     Review of Systems  Constitutional: Negative.   HENT: Negative.   Respiratory: Negative.   Cardiovascular: Negative.   Gastrointestinal: Negative.   Endocrine: Negative.   Genitourinary: Negative.   Musculoskeletal: Negative.   Neurological: Negative.   Hematological: Negative.   Psychiatric/Behavioral: Negative.   All other systems reviewed and are negative.      Objective:   Physical Exam  Constitutional: He is oriented to person, place, and time. He appears well-developed and well-nourished. No distress.  HENT:  Head: Normocephalic.  Right Ear: External ear normal.  Left Ear: External ear normal.  Nose: Nose normal.  Mouth/Throat: Oropharynx is clear and moist.  Eyes: Pupils are equal, round, and reactive to light. Right eye exhibits no discharge. Left eye exhibits no discharge.  Neck: Normal range of motion. Neck supple. No thyromegaly present.  Cardiovascular: Normal rate, regular rhythm, normal heart sounds and intact distal pulses.   No murmur heard. Pulmonary/Chest: Effort normal and breath sounds normal. No respiratory distress. He has no wheezes.  Abdominal: Soft. Bowel sounds are normal. He exhibits no distension. There is no tenderness.  Musculoskeletal: Normal range of motion. He exhibits tenderness (Right rib pain). He exhibits no edema.  Neurological: He is alert and oriented to person, place, and time. He has normal reflexes. No cranial nerve deficit.  Skin: Skin is  warm and dry. No rash noted. No erythema.  Psychiatric: He has a normal mood and affect. His behavior is normal. Judgment and thought content normal.  Vitals reviewed.   BP 133/96 mmHg  Pulse 83  Temp(Src) 97.7 F (36.5 C) (Oral)  Ht 5' 8" (1.727 m)  Wt 266 lb 3.2 oz (120.748 kg)  BMI 40.49 kg/m2       Assessment & Plan:  1. Diabetic polyneuropathy associated with type 2 diabetes mellitus - POCT glycosylated hemoglobin (Hb A1C) - CMP14+EGFR - Canagliflozin-Metformin HCl 150-1000 MG TABS; Take 1 tablet by mouth 2 (two) times daily.  Dispense: 180 tablet; Refill: 3  2. Hypothyroidism, unspecified hypothyroidism type - Thyroid Panel With TSH - CMP14+EGFR  3. Insomnia - zolpidem (AMBIEN) 10 MG tablet; TAKE 1 TABLET BY MOUTH AT BEDTIME AS NEEDED SLEEP  Dispense: 90 tablet; Refill: 0  4. Rib pain on right side - ketorolac (TORADOL) injection 60 mg; Inject 2 mLs (60 mg total) into the muscle once.   Continue all meds Labs pending Health Maintenance reviewed Diet and exercise encouraged RTO 3 months  [name], FNP   

## 2014-04-18 LAB — CMP14+EGFR
ALBUMIN: 4.2 g/dL (ref 3.5–5.5)
ALK PHOS: 127 IU/L — AB (ref 39–117)
ALT: 41 IU/L (ref 0–44)
AST: 27 IU/L (ref 0–40)
Albumin/Globulin Ratio: 2 (ref 1.1–2.5)
BUN/Creatinine Ratio: 16 (ref 9–20)
BUN: 12 mg/dL (ref 6–24)
Bilirubin Total: 0.4 mg/dL (ref 0.0–1.2)
CO2: 27 mmol/L (ref 18–29)
Calcium: 9.3 mg/dL (ref 8.7–10.2)
Chloride: 94 mmol/L — ABNORMAL LOW (ref 97–108)
Creatinine, Ser: 0.74 mg/dL — ABNORMAL LOW (ref 0.76–1.27)
GFR calc Af Amer: 127 mL/min/{1.73_m2} (ref >59)
GFR, EST NON AFRICAN AMERICAN: 110 mL/min/{1.73_m2} (ref >59)
GLOBULIN, TOTAL: 2.1 g/dL (ref 1.5–4.5)
Glucose: 261 mg/dL — ABNORMAL HIGH (ref 65–99)
Potassium: 4.6 mmol/L (ref 3.5–5.2)
SODIUM: 134 mmol/L (ref 134–144)
TOTAL PROTEIN: 6.3 g/dL (ref 6.0–8.5)

## 2014-04-18 LAB — THYROID PANEL WITH TSH
Free Thyroxine Index: 1.9 (ref 1.2–4.9)
T3 UPTAKE RATIO: 30 % (ref 24–39)
T4 TOTAL: 6.4 ug/dL (ref 4.5–12.0)
TSH: 4.81 u[IU]/mL — AB (ref 0.450–4.500)

## 2014-04-19 ENCOUNTER — Other Ambulatory Visit: Payer: Self-pay | Admitting: Family

## 2014-04-19 MED ORDER — LEVOTHYROXINE SODIUM 150 MCG PO TABS: 150.0000 ug | ORAL_TABLET | Freq: Every day | ORAL | Status: DC

## 2014-05-08 NOTE — Telephone Encounter (Signed)
They are not the same thing   What is the allergy   He has 2 choices   Tramadol or hydrocodone

## 2014-05-08 NOTE — Telephone Encounter (Signed)
Routing to Dr Harrison 

## 2014-05-08 NOTE — Telephone Encounter (Signed)
Patient is asking for a refill on pain medication, states he is allergic to codein , "Hydrocodone was flushed down the toilet by his mother since he's allergic" please advise?

## 2014-05-08 NOTE — Telephone Encounter (Signed)
HIS ALLERGY IS LISTED AS HYDROCODONE CAUSING NAUSEA AND VOMITING, TRAMADOL IT IS, HOW MANY?

## 2014-05-09 ENCOUNTER — Other Ambulatory Visit: Payer: Self-pay | Admitting: *Deleted

## 2014-05-09 MED ORDER — TRAMADOL HCL 50 MG PO TABS: 50.0000 mg | ORAL_TABLET | Freq: Four times a day (QID) | ORAL | Status: DC | PRN

## 2014-05-09 NOTE — Telephone Encounter (Signed)
50 mg q6 # 30 NR

## 2014-05-09 NOTE — Telephone Encounter (Signed)
Medication faxed to pharmacy, patient aware

## 2014-05-16 ENCOUNTER — Other Ambulatory Visit: Payer: Self-pay | Admitting: Family

## 2014-05-23 ENCOUNTER — Ambulatory Visit (INDEPENDENT_AMBULATORY_CARE_PROVIDER_SITE_OTHER): Payer: Medicare Other | Admitting: Orthopedic Surgery

## 2014-05-23 VITALS — BP 130/90 | Ht 68.0 in | Wt 266.0 lb

## 2014-05-23 DIAGNOSIS — S63641A Sprain of metacarpophalangeal joint of right thumb, initial encounter: Secondary | ICD-10-CM | POA: Diagnosis not present

## 2014-05-23 MED ORDER — HYDROCODONE-ACETAMINOPHEN 5-325 MG PO TABS: 1.0000 | ORAL_TABLET | Freq: Four times a day (QID) | ORAL | Status: DC | PRN

## 2014-05-23 MED ORDER — DIPHENHYDRAMINE HCL 25 MG PO CAPS: 25.0000 mg | ORAL_CAPSULE | Freq: Four times a day (QID) | ORAL | Status: DC | PRN

## 2014-05-23 NOTE — Patient Instructions (Signed)
Call [facility] dept to arrange OT

## 2014-05-23 NOTE — Progress Notes (Signed)
Patient ID: Charles Pennington, male   DOB: 25-Apr-1965, 49 y.o.   MRN: [id number] Chief Complaint  Patient presents with  . Follow-up    Recheck right thumb, DOI 02-24-14.    The patient had an ulnar collateral ligament injury to the thumb and now presents with some pain over the intercarpal phalangeal joint with stiffness and inability to oppose the thumb to the small finger and he has some decreased flexion. However his ligament has stabilized very nicely when compared to his opposite thumb. He remains neurovascularly intact. He is awake alert and oriented 3 his mood is pleasant his appearance is normal his vital signs are stable as recorded and reviewed.  System review he reports no numbness or tingling  Recommend occupational therapy  He will have hydrocodone with Benadryl for pain relief he is cautioned about narcotic use. He should not need any further prescriptions. Follow-up 6 weeks  Meds ordered this encounter  Medications  . HYDROcodone-acetaminophen (NORCO/VICODIN) 5-325 MG per tablet    Sig: Take 1 tablet by mouth every 6 (six) hours as needed for moderate pain.    Dispense:  30 tablet    Refill:  0  . diphenhydrAMINE (BENADRYL) 25 mg capsule    Sig: Take 1 capsule (25 mg total) by mouth every 6 (six) hours as needed.    Dispense:  30 capsule    Refill:  0

## 2014-05-31 ENCOUNTER — Other Ambulatory Visit: Payer: Medicare Other

## 2014-06-04 ENCOUNTER — Telehealth: Payer: Self-pay | Admitting: Family

## 2014-06-05 ENCOUNTER — Other Ambulatory Visit (INDEPENDENT_AMBULATORY_CARE_PROVIDER_SITE_OTHER): Payer: Medicare Other

## 2014-06-05 DIAGNOSIS — R946 Abnormal results of thyroid function studies: Secondary | ICD-10-CM

## 2014-06-05 DIAGNOSIS — R7989 Other specified abnormal findings of blood chemistry: Secondary | ICD-10-CM

## 2014-06-05 NOTE — Telephone Encounter (Signed)
Refill called to [facility]. Pt's VM states not set up yet

## 2014-06-05 NOTE — Progress Notes (Signed)
Lab only 

## 2014-06-06 ENCOUNTER — Telehealth: Payer: Self-pay | Admitting: *Deleted

## 2014-06-06 LAB — TSH: TSH: 6.02 u[IU]/mL — ABNORMAL HIGH (ref 0.450–4.500)

## 2014-06-06 NOTE — Telephone Encounter (Signed)
[facility] at their location  I have made multiple attempts to contact patient to advise of this and set up at another location  Patient has not returned call

## 2014-06-07 ENCOUNTER — Other Ambulatory Visit: Payer: Self-pay | Admitting: Family

## 2014-06-07 MED ORDER — LEVOTHYROXINE SODIUM 100 MCG PO TABS: 200.0000 ug | ORAL_TABLET | Freq: Every day | ORAL | Status: DC

## 2014-07-04 ENCOUNTER — Ambulatory Visit: Payer: Medicare Other | Admitting: Orthopedic Surgery

## 2014-07-04 ENCOUNTER — Encounter: Payer: Self-pay | Admitting: Orthopedic Surgery

## 2014-07-14 ENCOUNTER — Other Ambulatory Visit: Payer: Self-pay | Admitting: Family

## 2014-07-15 ENCOUNTER — Emergency Department (HOSPITAL_COMMUNITY)
Admission: EM | Admit: 2014-07-15 | Discharge: 2014-07-15 | Disposition: A | Discharge status: Home / Self Care | Payer: Medicare Other | Attending: Emergency Medicine | Admitting: Emergency Medicine

## 2014-07-15 ENCOUNTER — Emergency Department (HOSPITAL_COMMUNITY): Payer: Medicare Other

## 2014-07-15 ENCOUNTER — Encounter (HOSPITAL_COMMUNITY): Payer: Self-pay | Admitting: Emergency Medicine

## 2014-07-15 DIAGNOSIS — E785 Hyperlipidemia, unspecified: Secondary | ICD-10-CM | POA: Insufficient documentation

## 2014-07-15 DIAGNOSIS — W01198A Fall on same level from slipping, tripping and stumbling with subsequent striking against other object, initial encounter: Secondary | ICD-10-CM | POA: Diagnosis not present

## 2014-07-15 DIAGNOSIS — S3992XA Unspecified injury of lower back, initial encounter: Secondary | ICD-10-CM | POA: Diagnosis not present

## 2014-07-15 DIAGNOSIS — Y9389 Activity, other specified: Secondary | ICD-10-CM | POA: Insufficient documentation

## 2014-07-15 DIAGNOSIS — Y998 Other external cause status: Secondary | ICD-10-CM | POA: Insufficient documentation

## 2014-07-15 DIAGNOSIS — J45909 Unspecified asthma, uncomplicated: Secondary | ICD-10-CM | POA: Insufficient documentation

## 2014-07-15 DIAGNOSIS — S90414A Abrasion, right lesser toe(s), initial encounter: Secondary | ICD-10-CM | POA: Diagnosis not present

## 2014-07-15 DIAGNOSIS — Z720 Tobacco use: Secondary | ICD-10-CM | POA: Insufficient documentation

## 2014-07-15 DIAGNOSIS — Z794 Long term (current) use of insulin: Secondary | ICD-10-CM | POA: Insufficient documentation

## 2014-07-15 DIAGNOSIS — S90416A Abrasion, unspecified lesser toe(s), initial encounter: Secondary | ICD-10-CM | POA: Diagnosis not present

## 2014-07-15 DIAGNOSIS — S92501A Displaced unspecified fracture of right lesser toe(s), initial encounter for closed fracture: Secondary | ICD-10-CM

## 2014-07-15 DIAGNOSIS — Z8673 Personal history of transient ischemic attack (TIA), and cerebral infarction without residual deficits: Secondary | ICD-10-CM | POA: Insufficient documentation

## 2014-07-15 DIAGNOSIS — E119 Type 2 diabetes mellitus without complications: Secondary | ICD-10-CM | POA: Diagnosis not present

## 2014-07-15 DIAGNOSIS — E669 Obesity, unspecified: Secondary | ICD-10-CM | POA: Diagnosis not present

## 2014-07-15 DIAGNOSIS — Y92814 Boat as the place of occurrence of the external cause: Secondary | ICD-10-CM | POA: Diagnosis not present

## 2014-07-15 DIAGNOSIS — Z79899 Other long term (current) drug therapy: Secondary | ICD-10-CM | POA: Diagnosis not present

## 2014-07-15 DIAGNOSIS — F419 Anxiety disorder, unspecified: Secondary | ICD-10-CM | POA: Diagnosis not present

## 2014-07-15 DIAGNOSIS — I10 Essential (primary) hypertension: Secondary | ICD-10-CM | POA: Diagnosis not present

## 2014-07-15 DIAGNOSIS — E039 Hypothyroidism, unspecified: Secondary | ICD-10-CM | POA: Diagnosis not present

## 2014-07-15 DIAGNOSIS — S92511A Displaced fracture of proximal phalanx of right lesser toe(s), initial encounter for closed fracture: Secondary | ICD-10-CM | POA: Diagnosis not present

## 2014-07-15 DIAGNOSIS — S92514A Nondisplaced fracture of proximal phalanx of right lesser toe(s), initial encounter for closed fracture: Secondary | ICD-10-CM | POA: Insufficient documentation

## 2014-07-15 DIAGNOSIS — S99921A Unspecified injury of right foot, initial encounter: Secondary | ICD-10-CM | POA: Diagnosis present

## 2014-07-15 DIAGNOSIS — S99929A Unspecified injury of unspecified foot, initial encounter: Secondary | ICD-10-CM

## 2014-07-15 MED ORDER — HYDROCODONE-ACETAMINOPHEN 5-325 MG PO TABS: 1.0000 | ORAL_TABLET | ORAL | Status: DC | PRN

## 2014-07-15 MED ORDER — DIPHENHYDRAMINE HCL 25 MG PO CAPS
25.0000 mg | ORAL_CAPSULE | Freq: Once | ORAL | Status: AC
  Administered 2014-07-15: 25 mg via ORAL
  Filled 2014-07-15: qty 1

## 2014-07-15 MED ORDER — BACITRACIN-NEOMYCIN-POLYMYXIN 400-5-5000 EX OINT
TOPICAL_OINTMENT | Freq: Once | CUTANEOUS | Status: AC
  Administered 2014-07-15: 1 via TOPICAL
  Filled 2014-07-15: qty 1

## 2014-07-15 MED ORDER — DIPHENHYDRAMINE HCL 25 MG PO TABS: 25.0000 mg | ORAL_TABLET | Freq: Four times a day (QID) | ORAL | Status: DC

## 2014-07-15 MED ORDER — HYDROCODONE-ACETAMINOPHEN 5-325 MG PO TABS
2.0000 | ORAL_TABLET | Freq: Once | ORAL | Status: AC
  Administered 2014-07-15: 2 via ORAL
  Filled 2014-07-15: qty 2

## 2014-07-15 NOTE — Discharge Instructions (Signed)
Your xray reveals a break in the right fifth toe. You have an abrasion injury to the right first toe. Please apply neosporin bandage to the first toe until seen by the podiatry specialist. Please use the post op shoe for comfort of your broken toe. Keep foot elevated above the waist. Toe Fracture A toe fracture is a break in the bone of a toe. It may take 6 to 8 weeks for the toe injury to heal. HOME CARE  "Buddy taping" is taping the broken toe to the toe next to it. Leave the toes taped together for at least 1 week or as told by your doctor. Change the tape after bathing. Always use a small piece of gauze or cotton between the toes when taping them together.  Put ice on the injured area.  Put ice in a plastic bag.  Place a towel between your skin and the bag.  Leave the ice on for 15-20 minutes, 03-04 times a day.  After the first 2 days, put heat on the injured area. Use heat for the next 2 to 3 days. Put a heating pad on the foot or soak the foot in warm water as told by your doctor.  Keep the foot raised (elevated) above the level of your heart.  Wear sturdy, supportive shoes. The shoes should not pinch the toes or fit tightly against the toes.  Use a cast shoe (if prescribed) if the foot is very puffy (swollen).  Use crutches if you have pain or it hurts too much to walk.  Only take medicine as told by your doctor.  Follow up with your doctor as told. GET HELP RIGHT AWAY IF:   There is pain or puffiness that is not helped by medicine.  The pain does not get better after 1 week.  The toe is cold when the others are warm.  The toe loses feeling (numb) or turns white.  The toe becomes hot and red (inflamed). MAKE SURE YOU:   Understand these instructions.  Will watch this condition.  Will get help right away if you are not doing well or get worse. Document Released: 07/21/2007 Document Revised: 04/26/2011 Document Reviewed: 06/27/2009 [facility] Patient Information  2015 [place]. This information is not intended to replace advice given to you by your health care provider. Make sure you discuss any questions you have with your health care provider.

## 2014-07-15 NOTE — ED Provider Notes (Signed)
CSN: [id number]     Arrival date & time 07/15/14  0915 History   First MD Initiated Contact with Patient 07/15/14 [id number]     Chief Complaint  Patient presents with  . Toe Injury     (Consider location/radiation/quality/duration/timing/severity/associated sxs/prior Treatment) HPI Comments: Patient is a 49 year old male who presents to the emergency department with a complaint of pain involving the right foot.  The patient states that he was helping a friend work on a boat. The jack fell and hit his right foot. This occurred on yesterday May 29. The patient noted increasing pain during the night, but this morning he could not put weight on it without excruciating pain. Patient also noted bruising in the area of the fifth toe on the right. It is of note that he has had surgery on this area previously. Patient is also concerned because of his diabetes.  The history is provided by the patient.    Past Medical History  Diagnosis Date  . Diabetes mellitus   . Obesity   . Hypertension   . Hypothyroidism   . Vertigo   . Low back pain   . SOB (shortness of breath)   . Asthma   . Hypercholesteremia   . Anxiety   . Stroke age 49   Past Surgical History  Procedure Laterality Date  . Wrist surgery    . Spine surgery    . Foot surgery      Dr Ulice Brilliantrake   Family History  Problem Relation Age of Onset  . Hypertension Father   . Suicidality Father   . Diabetes Father   . Diabetes Mother    History  Substance Use Topics  . Smoking status: Current Every Day Smoker -- 0.50 packs/day    Types: Cigarettes  . Smokeless tobacco: Never Used  . Alcohol Use: Yes     Comment: occasional per pt    Review of Systems  Musculoskeletal: Positive for back pain and arthralgias.  Neurological: Positive for dizziness.  Psychiatric/Behavioral: The patient is nervous/anxious.   All other systems reviewed and are negative.     Allergies  Hydrocodone-acetaminophen  Home Medications   Prior to  Admission medications   Medication Sig Start Date End Date Taking? Authorizing Provider  ALPRAZolam Prudy Feeler(XANAX) 0.5 MG tablet TAKE 1 TABLET BY MOUTH 3 TIMES A DAY 06/05/14  Yes [name] [name], FNP  amLODipine (NORVASC) 5 MG tablet TAKE 1 TABLET (5 MG TOTAL) BY MOUTH DAILY. 04/09/14  Yes Junie Spencerhristy A [name], FNP  atorvastatin (LIPITOR) 80 MG tablet Take 1 tablet (80 mg total) by mouth daily. 03/20/14  Yes Junie Spencerhristy A [name], FNP  fenofibrate 160 MG tablet Take 1 tablet (160 mg total) by mouth daily. 07/19/13  Yes Junie Spencerhristy A [name], FNP  gabapentin (NEURONTIN) 600 MG tablet Take 600 mg by mouth 2 (two) times daily.   Yes Historical Provider, MD  LANTUS SOLOSTAR 100 UNIT/ML Solostar Pen INJECT 45 UNITS INTO THE SKIN 2 (TWO) TIMES DAILY. 04/09/14  Yes Junie Spencerhristy A [name], FNP  levothyroxine (SYNTHROID, LEVOTHROID) 100 MCG tablet Take 2 tablets (200 mcg total) by mouth daily. 06/07/14  Yes [name] [name], FNP  LYRICA 50 MG capsule Take 50 mg by mouth 2 (two) times daily. 02/14/14  Yes Historical Provider, MD  omeprazole (PRILOSEC) 20 MG capsule TAKE 1 CAPSULE (20 MG TOTAL) BY MOUTH DAILY. 04/09/14  Yes Junie Spencerhristy A [name], FNP  PARoxetine (PAXIL) 40 MG tablet Take 1 tablet (40 mg total) by mouth daily. 07/19/13  Yes Junie Spencer, FNP  PROAIR HFA 108 (90 BASE) MCG/ACT inhaler INHALE 2 PUFFS INTO THE LUNGS EVERY 6 (SIX) HOURS AS NEEDED FOR WHEEZING. 05/17/14  Yes Junie Spencer, FNP  zolpidem (AMBIEN) 10 MG tablet TAKE 1 TABLET BY MOUTH AT BEDTIME AS NEEDED SLEEP 04/17/14  Yes Junie Spencer, FNP  Canagliflozin-Metformin HCl [phone] MG TABS Take 1 tablet by mouth 2 (two) times daily. Patient not taking: Reported on 07/15/2014 04/17/14   Junie Spencer, FNP  diphenhydrAMINE (BENADRYL) 25 mg capsule Take 1 capsule (25 mg total) by mouth every 6 (six) hours as needed. Patient not taking: Reported on 07/15/2014 05/23/14   Vickki Hearing, MD  Glucose Blood (BLOOD GLUCOSE TEST STRIPS) STRP Use to check BG up to BID.  Dx:  250.02 Patient  not taking: Reported on 07/15/2014 08/15/13   Henrene Pastor, PHARMD  HYDROcodone-acetaminophen (NORCO/VICODIN) 5-325 MG per tablet Take 1 tablet by mouth every 6 (six) hours as needed for moderate pain. Patient not taking: Reported on 07/15/2014 05/23/14   Vickki Hearing, MD  Insulin Glargine (LANTUS SOLOSTAR) 100 UNIT/ML Solostar Pen INJECT 45 UNITS INTO THE SKIN 2 (TWO) TIMES DAILY. Patient not taking: Reported on 07/15/2014 02/12/14   Mary-Margaret Daphine Deutscher, FNP  traMADol (ULTRAM) 50 MG tablet Take 1 tablet (50 mg total) by mouth every 6 (six) hours as needed. Patient not taking: Reported on 07/15/2014 05/09/14   Vickki Hearing, MD  Vitamin D, Ergocalciferol, (DRISDOL) 50000 UNITS CAPS capsule Take 1 capsule (50,000 Units total) by mouth every 7 (seven) days. Patient not taking: Reported on 07/15/2014 03/20/14   Junie Spencer, FNP   BP 144/98 mmHg  Pulse 85  Temp(Src) 97.8 F (36.6 C) (Oral)  Resp 18  Ht  (1.702 m)  Wt 266 lb (120.657 kg)  BMI 41.65 kg/m2  SpO2 95% Physical Exam  Constitutional: He is oriented to person, place, and time. He appears well-developed and well-nourished.  Non-toxic appearance.  HENT:  Head: Normocephalic.  Right Ear: Tympanic membrane and external ear normal.  Left Ear: Tympanic membrane and external ear normal.  Eyes: EOM and lids are normal. Pupils are equal, round, and reactive to light.  Neck: Normal range of motion. Neck supple. Carotid bruit is not present.  Cardiovascular: Normal rate, regular rhythm, normal heart sounds, intact distal pulses and normal pulses.   Pulmonary/Chest: Breath sounds normal. No respiratory distress.  Abdominal: Soft. Bowel sounds are normal. There is no tenderness. There is no guarding.  Musculoskeletal: Normal range of motion.       Feet:  Dorsalis pedis pulse 2+. No temperature changes noted of the right or left lower extremities.  Lymphadenopathy:       Head (right side): No submandibular adenopathy present.        Head (left side): No submandibular adenopathy present.    He has no cervical adenopathy.  Neurological: He is alert and oriented to person, place, and time. He has normal strength. No cranial nerve deficit or sensory deficit.  Skin: Skin is warm and dry.  Psychiatric: He has a normal mood and affect. His speech is normal.  Nursing note and vitals reviewed.   ED Course  Procedures (including critical care time)  FRACTURE CARE Patient is a 49 year old male who presents to the emergency department with a complaint of injury to the right foot, in particular the right fifth toe. X-ray reveals a nondisplaced fracture of the proximal phalanx of the right fifth toe. I have discussed  the x-rays with the patient in terms which he understands. I have discussed the procedure in terms which he understands. The patient has had previous surgery on the fifth toe, and buddy tape of the fourth and fifth toe is not possible. The patient is fitted with a postoperative shoe and Ace wrap.  Patient tolerated the procedure without problem. Prescription for Norco given to the patient for pain. Patient will follow-up with orthopedics. Labs Review Labs Reviewed - No data to display  Imaging Review Dg Foot Complete Right  07/15/2014   CLINICAL DATA:  Pain and bruising about the right foot centered at the little toe after a crush injury 07/14/2014. Initial encounter.  EXAM: RIGHT FOOT COMPLETE - 3+ VIEW  COMPARISON:  None.  FINDINGS: The patient has an acute nondisplaced fracture through the base of the proximal phalanx of the right little toe. No other acute abnormality is identified. The distal fifth metatarsal has been resected.  IMPRESSION: Nondisplaced fracture base of the proximal phalanx of the right little toe.  Status post amputation of the distal fifth metatarsal.   Electronically Signed   By: Drusilla Kanner M.D.   On: 07/15/2014 09:41     EKG Interpretation None      MDM  There is no temperature  elevation. There is no red streaks involving the right foot. The x-ray of the right foot reveals a nondisplaced fracture of the proximal phalanx of the right fifth toe.  Fracture care was carried out, the patient was placed in a postoperative shoe.  I have discussed with the patient in detail the importance of monitoring the breeze area and the avulsion area of the nail involving the right first toe. Especially with in light of his diabetic condition. I've also discussed with the patient the need for him to follow-up with his podiatrist concerning the fracture of his fifth toe. We discussed ice, elevation, bandaged changing, and follow with podiatry. Prescription for Norco given to the patient.    Final diagnoses:  Foot injury    **I have reviewed nursing notes, vital signs, and all appropriate lab and imaging results for this patient.Ivery Quale, PA-C 07/16/14 [id number]  Samuel Jester, DO 07/18/14 2009

## 2014-07-15 NOTE — ED Notes (Signed)
Pt states that he was working on a boat yesterday and the jack it was on fell and hit his right pinkie toe.

## 2014-07-16 ENCOUNTER — Other Ambulatory Visit: Payer: Self-pay | Admitting: Family

## 2014-07-19 ENCOUNTER — Ambulatory Visit (INDEPENDENT_AMBULATORY_CARE_PROVIDER_SITE_OTHER): Payer: Medicare Other | Admitting: Family

## 2014-07-19 ENCOUNTER — Encounter: Payer: Self-pay | Admitting: Family

## 2014-07-19 VITALS — BP 158/99 | HR 93 | Temp 97.0°F | Ht 67.0 in | Wt 259.0 lb

## 2014-07-19 DIAGNOSIS — F17200 Nicotine dependence, unspecified, uncomplicated: Secondary | ICD-10-CM

## 2014-07-19 DIAGNOSIS — K219 Gastro-esophageal reflux disease without esophagitis: Secondary | ICD-10-CM

## 2014-07-19 DIAGNOSIS — F411 Generalized anxiety disorder: Secondary | ICD-10-CM | POA: Diagnosis not present

## 2014-07-19 DIAGNOSIS — G4700 Insomnia, unspecified: Secondary | ICD-10-CM | POA: Diagnosis not present

## 2014-07-19 DIAGNOSIS — I10 Essential (primary) hypertension: Secondary | ICD-10-CM

## 2014-07-19 DIAGNOSIS — Z794 Long term (current) use of insulin: Secondary | ICD-10-CM

## 2014-07-19 DIAGNOSIS — J4520 Mild intermittent asthma, uncomplicated: Secondary | ICD-10-CM

## 2014-07-19 DIAGNOSIS — E785 Hyperlipidemia, unspecified: Secondary | ICD-10-CM | POA: Diagnosis not present

## 2014-07-19 DIAGNOSIS — E1142 Type 2 diabetes mellitus with diabetic polyneuropathy: Secondary | ICD-10-CM | POA: Diagnosis not present

## 2014-07-19 DIAGNOSIS — E039 Hypothyroidism, unspecified: Secondary | ICD-10-CM

## 2014-07-19 DIAGNOSIS — F329 Major depressive disorder, single episode, unspecified: Secondary | ICD-10-CM | POA: Diagnosis not present

## 2014-07-19 DIAGNOSIS — E559 Vitamin D deficiency, unspecified: Secondary | ICD-10-CM | POA: Diagnosis not present

## 2014-07-19 DIAGNOSIS — E119 Type 2 diabetes mellitus without complications: Secondary | ICD-10-CM | POA: Diagnosis not present

## 2014-07-19 DIAGNOSIS — Z720 Tobacco use: Secondary | ICD-10-CM

## 2014-07-19 DIAGNOSIS — F32A Depression, unspecified: Secondary | ICD-10-CM

## 2014-07-19 LAB — POCT GLYCOSYLATED HEMOGLOBIN (HGB A1C): Hemoglobin A1C: 13.9

## 2014-07-19 LAB — GLUCOSE, POCT (MANUAL RESULT ENTRY): POC Glucose: >600 mg/dl (ref 70–99)

## 2014-07-19 MED ORDER — ALBUTEROL SULFATE HFA 108 (90 BASE) MCG/ACT IN AERS: INHALATION_SPRAY | RESPIRATORY_TRACT | Status: DC

## 2014-07-19 MED ORDER — PAROXETINE HCL 40 MG PO TABS: 40.0000 mg | ORAL_TABLET | Freq: Every day | ORAL | Status: DC

## 2014-07-19 MED ORDER — ZOLPIDEM TARTRATE 10 MG PO TABS: ORAL_TABLET | ORAL | Status: DC

## 2014-07-19 MED ORDER — LEVOTHYROXINE SODIUM 100 MCG PO TABS: 200.0000 ug | ORAL_TABLET | Freq: Every day | ORAL | Status: DC

## 2014-07-19 MED ORDER — INSULIN GLARGINE 100 UNIT/ML SOLOSTAR PEN: PEN_INJECTOR | SUBCUTANEOUS | Status: DC

## 2014-07-19 MED ORDER — ATORVASTATIN CALCIUM 80 MG PO TABS: 80.0000 mg | ORAL_TABLET | Freq: Every day | ORAL | Status: DC

## 2014-07-19 MED ORDER — ALPRAZOLAM 0.5 MG PO TABS: 0.5000 mg | ORAL_TABLET | Freq: Three times a day (TID) | ORAL | Status: DC

## 2014-07-19 MED ORDER — OMEPRAZOLE 20 MG PO CPDR: DELAYED_RELEASE_CAPSULE | ORAL | Status: DC

## 2014-07-19 MED ORDER — CANAGLIFLOZIN 300 MG PO TABS: 300.0000 mg | ORAL_TABLET | Freq: Every day | ORAL | Status: DC

## 2014-07-19 MED ORDER — PREGABALIN 50 MG PO CAPS: 50.0000 mg | ORAL_CAPSULE | Freq: Two times a day (BID) | ORAL | Status: DC

## 2014-07-19 MED ORDER — GABAPENTIN 600 MG PO TABS: 600.0000 mg | ORAL_TABLET | Freq: Two times a day (BID) | ORAL | Status: DC

## 2014-07-19 MED ORDER — AMLODIPINE BESYLATE 5 MG PO TABS: ORAL_TABLET | ORAL | Status: DC

## 2014-07-19 MED ORDER — HYDROCODONE-ACETAMINOPHEN 5-325 MG PO TABS: 1.0000 | ORAL_TABLET | Freq: Four times a day (QID) | ORAL | Status: DC | PRN

## 2014-07-19 MED ORDER — TRAMADOL HCL 50 MG PO TABS: 50.0000 mg | ORAL_TABLET | Freq: Four times a day (QID) | ORAL | Status: DC | PRN

## 2014-07-19 NOTE — Addendum Note (Signed)
Addended by: Prescott GumLAND, Gerlene Glassburn M on: 07/19/2014 09:21 AM   Modules accepted: Orders, SmartSet

## 2014-07-19 NOTE — Progress Notes (Signed)
Subjective:    Patient ID: Charles Pennington, male    DOB: 09-17-1965, 49 y.o.   MRN: [id number]  Diabetes He presents for his follow-up diabetic visit. He has type 2 diabetes mellitus. His disease course has been fluctuating. Hypoglycemia symptoms include nervousness/anxiousness. Pertinent negatives for hypoglycemia include no confusion, dizziness, headaches or sleepiness. Associated symptoms include visual change. Pertinent negatives for diabetes include no blurred vision, no fatigue, no foot paresthesias and no foot ulcerations. There are no hypoglycemic complications. Symptoms are stable. Diabetic complications include a CVA and peripheral neuropathy. Pertinent negatives for diabetic complications include no heart disease or nephropathy. Risk factors for coronary artery disease include diabetes mellitus, dyslipidemia, hypertension, male sex and tobacco exposure. Current diabetic treatment includes insulin injections and oral agent (monotherapy). He is compliant with treatment all of the time. His weight is stable. He is following a generally healthy diet. He participates in exercise three times a week. His breakfast blood glucose range is generally 130-140 mg/dl. Eye exam is not current (Pt educated on importance of yearly eye exams).  Hyperlipidemia This is a chronic problem. The current episode started more than 1 year ago. The problem is uncontrolled. Recent lipid tests were reviewed and are high. Exacerbating diseases include diabetes and hypothyroidism. Factors aggravating his hyperlipidemia include smoking. Pertinent negatives include no leg pain, myalgias or shortness of breath. Current antihyperlipidemic treatment includes statins. The current treatment provides significant improvement of lipids. Risk factors for coronary artery disease include diabetes mellitus, dyslipidemia, hypertension, male sex and obesity.  Hypertension This is a chronic problem. The current episode started more than 1  year ago. The problem has been waxing and waning since onset. The problem is uncontrolled. Associated symptoms include anxiety. Pertinent negatives include no blurred vision, headaches, malaise/fatigue, palpitations, peripheral edema or shortness of breath. Risk factors for coronary artery disease include diabetes mellitus, dyslipidemia, male gender, obesity and smoking/tobacco exposure. Past treatments include calcium channel blockers. The current treatment provides mild improvement. Hypertensive end-organ damage includes CVA and a thyroid problem. There is no history of kidney disease, CAD/MI or heart failure. There is no history of sleep apnea.  Thyroid Problem Presents for follow-up visit. Symptoms include anxiety, depressed mood and visual change. Patient reports no constipation, diarrhea, dry skin, fatigue, leg swelling, nail problem or palpitations. The symptoms have been stable. Past treatments include levothyroxine. The treatment provided significant relief. His past medical history is significant for diabetes and hyperlipidemia. There is no history of heart failure.  Anxiety Presents for follow-up visit. Symptoms include depressed mood and nervous/anxious behavior. Patient reports no confusion, dizziness, insomnia, nausea, palpitations or shortness of breath. Symptoms occur rarely. The quality of sleep is good. Nighttime awakenings: none.   His past medical history is significant for anxiety/panic attacks and depression. There is no history of arrhythmia, CAD, CHF or fibromyalgia. Past treatments include SSRIs and benzodiazephines.  Gastrophageal Reflux He reports no belching, no coughing, no heartburn, no nausea, no sore throat or no tooth decay. This is a chronic problem. The current episode started more than 1 year ago. The problem occurs rarely. The problem has been resolved. Pertinent negatives include no fatigue. Risk factors include caffeine use. He has tried a PPI for the symptoms. The  treatment provided significant relief.      Review of Systems  Constitutional: Negative.  Negative for malaise/fatigue and fatigue.  HENT: Negative.  Negative for sore throat.   Eyes: Negative for blurred vision.  Respiratory: Negative.  Negative for cough and shortness  of breath.   Cardiovascular: Negative.  Negative for palpitations.  Gastrointestinal: Negative.  Negative for heartburn, nausea, diarrhea and constipation.  Endocrine: Negative.   Genitourinary: Negative.   Musculoskeletal: Negative.  Negative for myalgias.  Neurological: Negative.  Negative for dizziness and headaches.  Hematological: Negative.   Psychiatric/Behavioral: Negative for confusion. The patient is nervous/anxious. The patient does not have insomnia.   All other systems reviewed and are negative.      Objective:   Physical Exam  Constitutional: He is oriented to person, place, and time. He appears well-developed and well-nourished. No distress.  HENT:  Head: Normocephalic.  Right Ear: External ear normal.  Left Ear: External ear normal.  Nose: Nose normal.  Mouth/Throat: Oropharynx is clear and moist.  Eyes: Pupils are equal, round, and reactive to light. Right eye exhibits no discharge. Left eye exhibits no discharge.  Neck: Normal range of motion. Neck supple. No thyromegaly present.  Cardiovascular: Normal rate, regular rhythm, normal heart sounds and intact distal pulses.   No murmur heard. Pulmonary/Chest: Effort normal and breath sounds normal. No respiratory distress. He has no wheezes.  Abdominal: Soft. Bowel sounds are normal. He exhibits no distension. There is no tenderness.  Musculoskeletal: Normal range of motion. He exhibits no edema or tenderness.  Neurological: He is alert and oriented to person, place, and time. He has normal reflexes. No cranial nerve deficit.  Skin: Skin is warm and dry. No rash noted. No erythema.  Psychiatric: He has a normal mood and affect. His behavior is  normal. Judgment and thought content normal.  Vitals reviewed.     BP 158/99 mmHg  Pulse 93  Temp(Src) 97 F (36.1 C) (Oral)  Ht _0  (1.702 m)  Wt 259 lb (117.482 kg)  BMI 40.56 kg/m2     Assessment & Plan:  1. Essential hypertension -Pt has not been taking Norvasc- Will start and recheck in two weeks - CMP14+EGFR - amLODipine (NORVASC) 5 MG tablet; TAKE 1 TABLET (5 MG TOTAL) BY MOUTH DAILY.  Dispense: 90 tablet; Refill: 3  2. Asthma, mild intermittent, uncomplicated - ZRA07+MAUQ - albuterol (PROAIR HFA) 108 (90 BASE) MCG/ACT inhaler; INHALE 2 PUFFS INTO THE LUNGS EVERY 6 (SIX) HOURS AS NEEDED FOR WHEEZING.  Dispense: 8.5 Inhaler; Refill: 2  3. Gastroesophageal reflux disease, esophagitis presence not specified - CMP14+EGFR - omeprazole (PRILOSEC) 20 MG capsule; TAKE 1 CAPSULE (20 MG TOTAL) BY MOUTH DAILY.  Dispense: 90 capsule; Refill: 3  4. Hypothyroidism, unspecified hypothyroidism type -Pt has been taking only 150 mcg daily- Will adjust and redraw blood work in 8 weeks - CMP14+EGFR - Thyroid Panel With TSH - levothyroxine (SYNTHROID, LEVOTHROID) 100 MCG tablet; Take 2 tablets (200 mcg total) by mouth daily.  Dispense: 180 tablet; Refill: 3  5. Diabetic polyneuropathy associated with type 2 diabetes mellitus -Pt had stopped Invomet because of diarrhea- Invokana 300 mg rx sent to pharmacy today -Pt to be on low carb diet - POCT glycosylated hemoglobin (Hb A1C) - CMP14+EGFR - canagliflozin (INVOKANA) 300 MG TABS tablet; Take 300 mg by mouth daily before breakfast.  Dispense: 90 tablet; Refill: 2 - gabapentin (NEURONTIN) 600 MG tablet; Take 1 tablet (600 mg total) by mouth 2 (two) times daily.  Dispense: 180 tablet; Refill: 3 - Insulin Glargine (LANTUS SOLOSTAR) 100 UNIT/ML Solostar Pen; INJECT 45 UNITS INTO THE SKIN 2 (TWO) TIMES DAILY.  Dispense: 5 pen; Refill: 6 - Insulin Glargine (LANTUS SOLOSTAR) 100 UNIT/ML Solostar Pen; INJECT 45 UNITS INTO THE SKIN 2 (TWO)  TIMES  DAILY.  Dispense: 30 mL; Refill: 4 - pregabalin (LYRICA) 50 MG capsule; Take 1 capsule (50 mg total) by mouth 2 (two) times daily.  Dispense: 180 capsule; Refill: 1  6. Hyperlipidemia -Pt has not been taking Lipitor- Will start - CMP14+EGFR - Lipid panel - atorvastatin (LIPITOR) 80 MG tablet; Take 1 tablet (80 mg total) by mouth daily.  Dispense: 90 tablet; Refill: 3  7. SMOKER - CMP14+EGFR  8. Depression - CMP14+EGFR - ALPRAZolam (XANAX) 0.5 MG tablet; Take 1 tablet (0.5 mg total) by mouth 3 (three) times daily.  Dispense: 90 tablet; Refill: 2 - PARoxetine (PAXIL) 40 MG tablet; Take 1 tablet (40 mg total) by mouth daily.  Dispense: 90 tablet; Refill: 3  9. GAD (generalized anxiety disorder) - CMP14+EGFR - ALPRAZolam (XANAX) 0.5 MG tablet; Take 1 tablet (0.5 mg total) by mouth 3 (three) times daily.  Dispense: 90 tablet; Refill: 2 - PARoxetine (PAXIL) 40 MG tablet; Take 1 tablet (40 mg total) by mouth daily.  Dispense: 90 tablet; Refill: 3  10. Insomnia - CMP14+EGFR - zolpidem (AMBIEN) 10 MG tablet; TAKE 1 TABLET BY MOUTH AT BEDTIME AS NEEDED SLEEP  Dispense: 90 tablet; Refill: 0  11. Vitamin D deficiency - CMP14+EGFR - Vit D  25 hydroxy (rtn osteoporosis monitoring)  12. Type 2 diabetes mellitus treated with insulin - canagliflozin (INVOKANA) 300 MG TABS tablet; Take 300 mg by mouth daily before breakfast.  Dispense: 90 tablet; Refill: 2 - Insulin Glargine (LANTUS SOLOSTAR) 100 UNIT/ML Solostar Pen; INJECT 45 UNITS INTO THE SKIN 2 (TWO) TIMES DAILY.  Dispense: 5 pen; Refill: 6 - Insulin Glargine (LANTUS SOLOSTAR) 100 UNIT/ML Solostar Pen; INJECT 45 UNITS INTO THE SKIN 2 (TWO) TIMES DAILY.  Dispense: 30 mL; Refill: 4   Continue all meds- Pt had not been taking over half of his medications because he stated he was confused- Long discussion with pt on his medications and I highlighted all of his medications he needs to take daily- Pt to follow up in 2 weeks to make sure he is taking  his medications properly Labs pending Health Maintenance reviewed Diet and exercise encouraged RTO 2 weeks  Evelina Dun, FNP

## 2014-07-19 NOTE — Addendum Note (Signed)
Addended by: Prescott GumLAND, Maxim Bedel M on: 07/19/2014 09:00 AM   Modules accepted: SmartSet

## 2014-07-19 NOTE — Patient Instructions (Signed)

## 2014-07-20 LAB — CMP14+EGFR
ALT: 16 IU/L (ref 0–44)
AST: 14 IU/L (ref 0–40)
Albumin/Globulin Ratio: 1.5 (ref 1.1–2.5)
Albumin: 4.1 g/dL (ref 3.5–5.5)
Alkaline Phosphatase: 106 IU/L (ref 39–117)
BUN/Creatinine Ratio: 11 (ref 9–20)
BUN: 12 mg/dL (ref 6–24)
Bilirubin Total: <0.2 mg/dL (ref 0.0–1.2)
CALCIUM: 9.7 mg/dL (ref 8.7–10.2)
CHLORIDE: 89 mmol/L — AB (ref 97–108)
CO2: 26 mmol/L (ref 18–29)
CREATININE: 1.05 mg/dL (ref 0.76–1.27)
GFR, EST AFRICAN AMERICAN: 97 mL/min/{1.73_m2} (ref >59)
GFR, EST NON AFRICAN AMERICAN: 84 mL/min/{1.73_m2} (ref >59)
Globulin, Total: 2.7 g/dL (ref 1.5–4.5)
Glucose: 677 mg/dL (ref 65–99)
Potassium: 5.4 mmol/L — ABNORMAL HIGH (ref 3.5–5.2)
SODIUM: 132 mmol/L — AB (ref 134–144)
TOTAL PROTEIN: 6.8 g/dL (ref 6.0–8.5)

## 2014-07-20 LAB — THYROID PANEL WITH TSH
FREE THYROXINE INDEX: 1.6 (ref 1.2–4.9)
T3 Uptake Ratio: 29 % (ref 24–39)
T4, Total: 5.5 ug/dL (ref 4.5–12.0)
TSH: 6.75 u[IU]/mL — ABNORMAL HIGH (ref 0.450–4.500)

## 2014-07-20 LAB — VITAMIN D 25 HYDROXY (VIT D DEFICIENCY, FRACTURES): Vit D, 25-Hydroxy: 19.8 ng/mL — ABNORMAL LOW (ref 30.0–100.0)

## 2014-07-23 DIAGNOSIS — L03032 Cellulitis of left toe: Secondary | ICD-10-CM | POA: Diagnosis not present

## 2014-08-06 ENCOUNTER — Ambulatory Visit: Payer: Medicare Other | Admitting: Family

## 2014-08-21 ENCOUNTER — Ambulatory Visit (INDEPENDENT_AMBULATORY_CARE_PROVIDER_SITE_OTHER): Payer: Medicare Other | Admitting: Family

## 2014-08-21 ENCOUNTER — Encounter: Payer: Self-pay | Admitting: Family

## 2014-08-21 VITALS — BP 135/96 | HR 115 | Temp 100.0°F | Ht 67.0 in | Wt 250.0 lb

## 2014-08-21 DIAGNOSIS — F411 Generalized anxiety disorder: Secondary | ICD-10-CM | POA: Diagnosis not present

## 2014-08-21 DIAGNOSIS — K219 Gastro-esophageal reflux disease without esophagitis: Secondary | ICD-10-CM

## 2014-08-21 DIAGNOSIS — I10 Essential (primary) hypertension: Secondary | ICD-10-CM

## 2014-08-21 DIAGNOSIS — E119 Type 2 diabetes mellitus without complications: Secondary | ICD-10-CM

## 2014-08-21 DIAGNOSIS — Z720 Tobacco use: Secondary | ICD-10-CM

## 2014-08-21 DIAGNOSIS — Z794 Long term (current) use of insulin: Secondary | ICD-10-CM

## 2014-08-21 DIAGNOSIS — E039 Hypothyroidism, unspecified: Secondary | ICD-10-CM | POA: Diagnosis not present

## 2014-08-21 DIAGNOSIS — E785 Hyperlipidemia, unspecified: Secondary | ICD-10-CM

## 2014-08-21 DIAGNOSIS — G4700 Insomnia, unspecified: Secondary | ICD-10-CM

## 2014-08-21 DIAGNOSIS — F17200 Nicotine dependence, unspecified, uncomplicated: Secondary | ICD-10-CM

## 2014-08-21 DIAGNOSIS — E1142 Type 2 diabetes mellitus with diabetic polyneuropathy: Secondary | ICD-10-CM | POA: Diagnosis not present

## 2014-08-21 MED ORDER — ZOLPIDEM TARTRATE 10 MG PO TABS: ORAL_TABLET | ORAL | Status: DC

## 2014-08-21 MED ORDER — HYDROCODONE-ACETAMINOPHEN 5-325 MG PO TABS: 1.0000 | ORAL_TABLET | Freq: Four times a day (QID) | ORAL | Status: DC | PRN

## 2014-08-21 MED ORDER — INSULIN PEN NEEDLE 31G X 4 MM MISC: 1.0000 "application " | Freq: Two times a day (BID) | Status: DC

## 2014-08-21 NOTE — Patient Instructions (Signed)

## 2014-08-21 NOTE — Progress Notes (Signed)
Subjective:    Patient ID: Charles Pennington, male    DOB: 1965/10/23, 49 y.o.   MRN: [id number]  HPI Pt presents to the office today to follow up. Pt was seen on 07/19/14 and was not taking all of his medications. I had a long discussion with pt and gave him a print out with all of his medications and highlighted his medications he needed. Pt reports today that he went to the pharmacy and had them filled, but only reports taking 7 medications. Pt did not bring his bottles and can not recall all the medications he is taking. Pt told to bring all of his medication on next visit. Pt states he believes he is taking his levothyroxine, Paxil , omeprazole, and insulin daily.    Review of Systems  Constitutional: Negative.   HENT: Negative.   Respiratory: Negative.   Cardiovascular: Negative.   Gastrointestinal: Negative.   Endocrine: Negative.   Genitourinary: Negative.   Musculoskeletal: Negative.   Neurological: Negative.   Hematological: Negative.   Psychiatric/Behavioral: Negative.   All other systems reviewed and are negative.      Objective:   Physical Exam  Constitutional: He is oriented to person, place, and time. He appears well-developed and well-nourished. No distress.  HENT:  Head: Normocephalic.  Right Ear: External ear normal.  Left Ear: External ear normal.  Nose: Nose normal.  Mouth/Throat: Oropharynx is clear and moist.  Eyes: Pupils are equal, round, and reactive to light. Right eye exhibits no discharge. Left eye exhibits no discharge.  Neck: Normal range of motion. Neck supple. No thyromegaly present.  Cardiovascular: Normal rate, regular rhythm, normal heart sounds and intact distal pulses.   No murmur heard. Pulmonary/Chest: Effort normal and breath sounds normal. No respiratory distress. He has no wheezes.  Abdominal: Soft. Bowel sounds are normal. He exhibits no distension. There is no tenderness.  Musculoskeletal: Normal range of motion. He exhibits no  edema or tenderness.  Neurological: He is alert and oriented to person, place, and time. He has normal reflexes. No cranial nerve deficit.  Skin: Skin is warm and dry. No rash noted. No erythema.  Psychiatric: He has a normal mood and affect. His behavior is normal. Judgment and thought content normal.  Vitals reviewed.    BP 135/96 mmHg  Pulse 115  Temp(Src) 100 F (37.8 C) (Oral)  Ht 5' 7"  (1.702 m)  Wt 250 lb (113.399 kg)  BMI 39.15 kg/m2      Assessment & Plan:  1. Essential hypertension - CMP14+EGFR  2. Gastroesophageal reflux disease, esophagitis presence not specified - CMP14+EGFR  3. Hypothyroidism, unspecified hypothyroidism type - CMP14+EGFR  4. Diabetic polyneuropathy associated with type 2 diabetes mellitus - POCT glycosylated hemoglobin (Hb A1C) - CMP14+EGFR - POCT glucose (manual entry) - Insulin Pen Needle 31G X 4 MM MISC; 1 application by Does not apply route 2 (two) times daily.  Dispense: 100 each; Refill: 3  5. GAD (generalized anxiety disorder)  - CMP14+EGFR  6. Hyperlipidemia - CMP14+EGFR  7. SMOKER - CMP14+EGFR  8. Type 2 diabetes mellitus treated with insulin - POCT glycosylated hemoglobin (Hb A1C) - CMP14+EGFR - POCT glucose (manual entry) - Insulin Pen Needle 31G X 4 MM MISC; 1 application by Does not apply route 2 (two) times daily.  Dispense: 100 each; Refill: 3   Continue all meds Labs pending Health Maintenance reviewed-PT to schedule eye exam! Diet and exercise encouraged RTO 2 weeks to discuss medications- PT TOLD TO BRING ALL MEDICATIONS WITH  HIM ON HIS NEXT VISIT!!!  Evelina Dun, FNP

## 2014-08-27 ENCOUNTER — Other Ambulatory Visit: Payer: Self-pay | Admitting: Family

## 2014-08-27 NOTE — Telephone Encounter (Signed)
I have already refilled this and sent to [facility]

## 2014-08-27 NOTE — Telephone Encounter (Signed)
Patient aware that rx has been called into pharmacy. 

## 2014-08-27 NOTE — Telephone Encounter (Signed)
Last filled 07/31/14, last seen 08/23/14. Route to pool, nurse call in at [facility]

## 2014-08-27 NOTE — Telephone Encounter (Signed)
Refill called to [facility] 

## 2014-09-04 ENCOUNTER — Encounter: Payer: Self-pay | Admitting: Family

## 2014-09-04 ENCOUNTER — Ambulatory Visit (INDEPENDENT_AMBULATORY_CARE_PROVIDER_SITE_OTHER): Payer: Medicare Other | Admitting: Family

## 2014-09-04 VITALS — BP 119/85 | HR 89 | Temp 97.0°F | Ht 67.0 in | Wt 252.2 lb

## 2014-09-04 DIAGNOSIS — E119 Type 2 diabetes mellitus without complications: Secondary | ICD-10-CM

## 2014-09-04 DIAGNOSIS — F329 Major depressive disorder, single episode, unspecified: Secondary | ICD-10-CM

## 2014-09-04 DIAGNOSIS — E039 Hypothyroidism, unspecified: Secondary | ICD-10-CM | POA: Diagnosis not present

## 2014-09-04 DIAGNOSIS — J4520 Mild intermittent asthma, uncomplicated: Secondary | ICD-10-CM | POA: Diagnosis not present

## 2014-09-04 DIAGNOSIS — Z794 Long term (current) use of insulin: Secondary | ICD-10-CM

## 2014-09-04 DIAGNOSIS — E1142 Type 2 diabetes mellitus with diabetic polyneuropathy: Secondary | ICD-10-CM | POA: Diagnosis not present

## 2014-09-04 DIAGNOSIS — F411 Generalized anxiety disorder: Secondary | ICD-10-CM

## 2014-09-04 DIAGNOSIS — F32A Depression, unspecified: Secondary | ICD-10-CM

## 2014-09-04 DIAGNOSIS — Z720 Tobacco use: Secondary | ICD-10-CM | POA: Diagnosis not present

## 2014-09-04 DIAGNOSIS — I10 Essential (primary) hypertension: Secondary | ICD-10-CM

## 2014-09-04 DIAGNOSIS — E785 Hyperlipidemia, unspecified: Secondary | ICD-10-CM

## 2014-09-04 DIAGNOSIS — F17200 Nicotine dependence, unspecified, uncomplicated: Secondary | ICD-10-CM

## 2014-09-04 LAB — POCT GLYCOSYLATED HEMOGLOBIN (HGB A1C): HEMOGLOBIN A1C: 13.8

## 2014-09-04 NOTE — Patient Instructions (Signed)
Diabetes Mellitus and Food It is important for you to manage your blood sugar (glucose) level. Your blood glucose level can be greatly affected by what you eat. Eating healthier foods in the appropriate amounts throughout the day at about the same time each day will help you control your blood glucose level. It can also help slow or prevent worsening of your diabetes mellitus. Healthy eating may even help you improve the level of your blood pressure and reach or maintain a healthy weight.  HOW CAN FOOD AFFECT ME? Carbohydrates Carbohydrates affect your blood glucose level more than any other type of food. Your dietitian will help you determine how many carbohydrates to eat at each meal and teach you how to count carbohydrates. Counting carbohydrates is important to keep your blood glucose at a healthy level, especially if you are using insulin or taking certain medicines for diabetes mellitus. Alcohol Alcohol can cause sudden decreases in blood glucose (hypoglycemia), especially if you use insulin or take certain medicines for diabetes mellitus. Hypoglycemia can be a life-threatening condition. Symptoms of hypoglycemia (sleepiness, dizziness, and disorientation) are similar to symptoms of having too much alcohol.  If your health care provider has given you approval to drink alcohol, do so in moderation and use the following guidelines:  Women should not have more than one drink per day, and men should not have more than two drinks per day. One drink is equal to:  12 oz of beer.  5 oz of wine.  1 oz of hard liquor.  Do not drink on an empty stomach.  Keep yourself hydrated. Have water, diet soda, or unsweetened iced tea.  Regular soda, juice, and other mixers might contain a lot of carbohydrates and should be counted. WHAT FOODS ARE NOT RECOMMENDED? As you make food choices, it is important to remember that all foods are not the same. Some foods have fewer nutrients per serving than other  foods, even though they might have the same number of calories or carbohydrates. It is difficult to get your body what it needs when you eat foods with fewer nutrients. Examples of foods that you should avoid that are high in calories and carbohydrates but low in nutrients include:  Trans fats (most processed foods list trans fats on the Nutrition Facts label).  Regular soda.  Juice.  Candy.  Sweets, such as cake, pie, doughnuts, and cookies.  Fried foods. WHAT FOODS CAN I EAT? Have nutrient-rich foods, which will nourish your body and keep you healthy. The food you should eat also will depend on several factors, including:  The calories you need.  The medicines you take.  Your weight.  Your blood glucose level.  Your blood pressure level.  Your cholesterol level. You also should eat a variety of foods, including:  Protein, such as meat, poultry, fish, tofu, nuts, and seeds (lean animal proteins are best).  Fruits.  Vegetables.  Dairy products, such as milk, cheese, and yogurt (low fat is best).  Breads, grains, pasta, cereal, rice, and beans.  Fats such as olive oil, trans fat-free margarine, canola oil, avocado, and olives. DOES EVERYONE WITH DIABETES MELLITUS HAVE THE SAME MEAL PLAN? Because every person with diabetes mellitus is different, there is not one meal plan that works for everyone. It is very important that you meet with a dietitian who will help you create a meal plan that is just right for you. Document Released: 10/29/2004 Document Revised: 02/06/2013 Document Reviewed: 12/29/2012 [employer] Patient Information 2015 [employer]. This   information is not intended to replace advice given to you by your health care provider. Make sure you discuss any questions you have with your health care provider. Hypothyroidism The thyroid is a large gland located in the lower front of your neck. The thyroid gland helps control metabolism. Metabolism is how your body  handles food. It controls metabolism with the hormone thyroxine. When this gland is underactive (hypothyroid), it produces too little hormone.  CAUSES These include:   Absence or destruction of thyroid tissue.  Goiter due to iodine deficiency.  Goiter due to medications.  Congenital defects (since birth).  Problems with the pituitary. This causes a lack of TSH (thyroid stimulating hormone). This hormone tells the thyroid to turn out more hormone. SYMPTOMS  Lethargy (feeling as though you have no energy)  Cold intolerance  Weight gain (in spite of normal food intake)  Dry skin  Coarse hair  Menstrual irregularity (if severe, may lead to infertility)  Slowing of thought processes Cardiac problems are also caused by insufficient amounts of thyroid hormone. Hypothyroidism in the newborn is cretinism, and is an extreme form. It is important that this form be treated adequately and immediately or it will lead rapidly to retarded physical and mental development. DIAGNOSIS  To prove hypothyroidism, your caregiver may do blood tests and ultrasound tests. Sometimes the signs are hidden. It may be necessary for your caregiver to watch this illness with blood tests either before or after diagnosis and treatment. TREATMENT  Low levels of thyroid hormone are increased by using synthetic thyroid hormone. This is a safe, effective treatment. It usually takes about four weeks to gain the full effects of the medication. After you have the full effect of the medication, it will generally take another four weeks for problems to leave. Your caregiver may start you on low doses. If you have had heart problems the dose may be gradually increased. It is generally not an emergency to get rapidly to normal. HOME CARE INSTRUCTIONS   Take your medications as your caregiver suggests. Let your caregiver know of any medications you are taking or start taking. Your caregiver will help you with dosage  schedules.  As your condition improves, your dosage needs may increase. It will be necessary to have continuing blood tests as suggested by your caregiver.  Report all suspected medication side effects to your caregiver. SEEK MEDICAL CARE IF: Seek medical care if you develop:  Sweating.  Tremulousness (tremors).  Anxiety.  Rapid weight loss.  Heat intolerance.  Emotional swings.  Diarrhea.  Weakness. SEEK IMMEDIATE MEDICAL CARE IF:  You develop chest pain, an irregular heart beat (palpitations), or a rapid heart beat. MAKE SURE YOU:   Understand these instructions.  Will watch your condition.  Will get help right away if you are not doing well or get worse. Document Released: 02/01/2005 Document Revised: 04/26/2011 Document Reviewed: 09/22/2007 [facility] Patient Information 2015 [place], [place]. This information is not intended to replace advice given to you by your health care provider. Make sure you discuss any questions you have with your health care provider.

## 2014-09-04 NOTE — Progress Notes (Signed)
   Subjective:    Patient ID: Charles Pennington, male    DOB: Mar 04, 1965, 49 y.o.   MRN: [id number]  HPI Pt presents to the office today to discuss medications. Pt has been seen several times over the last month, but has had confusion over which medications he is suppose to be taking. Pt has brought in all of his medications to this visit to verify that he is taking the correct dosing of his medications. Pt reports that his blood sugars the last few days have been in the 120's. Pt's HTN is at goal today! Pt reports taking all of his medications at this time.    Review of Systems  Constitutional: Negative.   HENT: Negative.   Respiratory: Negative.   Cardiovascular: Negative.   Gastrointestinal: Negative.   Endocrine: Negative.   Genitourinary: Negative.   Musculoskeletal: Negative.   Neurological: Negative.   Hematological: Negative.   Psychiatric/Behavioral: Negative.   All other systems reviewed and are negative.      Objective:   Physical Exam  Constitutional: He is oriented to person, place, and time. He appears well-developed and well-nourished. No distress.  HENT:  Head: Normocephalic.  Right Ear: External ear normal.  Left Ear: External ear normal.  Nose: Nose normal.  Mouth/Throat: Oropharynx is clear and moist.  Eyes: Pupils are equal, round, and reactive to light. Right eye exhibits no discharge. Left eye exhibits no discharge.  Neck: Normal range of motion. Neck supple. No thyromegaly present.  Cardiovascular: Normal rate, regular rhythm, normal heart sounds and intact distal pulses.   No murmur heard. Pulmonary/Chest: Effort normal and breath sounds normal. No respiratory distress. He has no wheezes.  Abdominal: Soft. Bowel sounds are normal. He exhibits no distension. There is no tenderness.  Musculoskeletal: Normal range of motion. He exhibits no edema or tenderness.  Neurological: He is alert and oriented to person, place, and time. He has normal reflexes. No  cranial nerve deficit.  Skin: Skin is warm and dry. No rash noted. No erythema.  Psychiatric: He has a normal mood and affect. His behavior is normal. Judgment and thought content normal.  Vitals reviewed.   BP 119/85 mmHg  Pulse 89  Temp(Src) 97 F (36.1 C) (Oral)  Ht $R'5\' 7"'ke$  (1.702 m)  Wt 252 lb 3.2 oz (114.397 kg)  BMI 39.49 kg/m2      Assessment & Plan:  1. Essential hypertension - CMP14+EGFR  2. Asthma, mild intermittent, uncomplicated - KZS01+[id number]  3. Type 2 diabetes mellitus treated with insulin - POCT glycosylated hemoglobin (Hb A1C) - CMP14+EGFR - Ambulatory referral to Ophthalmology  4. Hypothyroidism, unspecified hypothyroidism type - CMP14+EGFR - Thyroid Panel With TSH  5. Diabetic polyneuropathy associated with type 2 diabetes mellitus - CMP14+EGFR  6. SMOKER - CMP14+EGFR  7. Hyperlipidemia - CMP14+EGFR - Lipid panel  8. GAD (generalized anxiety disorder) - CMP14+EGFR  9. Depression - CMP14+EGFR   Continue all meds Labs pending Health Maintenance reviewed Diet and exercise encouraged RTO 3 months   Evelina Dun, FNP

## 2014-09-05 ENCOUNTER — Telehealth: Payer: Self-pay | Admitting: *Deleted

## 2014-09-05 ENCOUNTER — Other Ambulatory Visit: Payer: Self-pay | Admitting: Family

## 2014-09-05 ENCOUNTER — Encounter: Payer: Self-pay | Admitting: *Deleted

## 2014-09-05 LAB — LIPID PANEL
CHOLESTEROL TOTAL: 159 mg/dL (ref 100–199)
Chol/HDL Ratio: 4.2 ratio units (ref 0.0–5.0)
HDL: 38 mg/dL — AB (ref >39)
LDL CALC: 88 mg/dL (ref 0–99)
Triglycerides: 167 mg/dL — ABNORMAL HIGH (ref 0–149)
VLDL Cholesterol Cal: 33 mg/dL (ref 5–40)

## 2014-09-05 LAB — THYROID PANEL WITH TSH
FREE THYROXINE INDEX: 2.3 (ref 1.2–4.9)
T3 UPTAKE RATIO: 31 % (ref 24–39)
T4, Total: 7.5 ug/dL (ref 4.5–12.0)
TSH: 6.28 u[IU]/mL — ABNORMAL HIGH (ref 0.450–4.500)

## 2014-09-05 LAB — CMP14+EGFR
A/G RATIO: 1.8 (ref 1.1–2.5)
ALBUMIN: 4.3 g/dL (ref 3.5–5.5)
ALT: 32 IU/L (ref 0–44)
AST: 31 IU/L (ref 0–40)
Alkaline Phosphatase: 85 IU/L (ref 39–117)
BILIRUBIN TOTAL: 0.3 mg/dL (ref 0.0–1.2)
BUN / CREAT RATIO: 17 (ref 9–20)
BUN: 16 mg/dL (ref 6–24)
CALCIUM: 9.2 mg/dL (ref 8.7–10.2)
CHLORIDE: 92 mmol/L — AB (ref 97–108)
CO2: 26 mmol/L (ref 18–29)
Creatinine, Ser: 0.95 mg/dL (ref 0.76–1.27)
GFR calc non Af Amer: 94 mL/min/{1.73_m2} (ref >59)
GFR, EST AFRICAN AMERICAN: 109 mL/min/{1.73_m2} (ref >59)
GLUCOSE: 179 mg/dL — AB (ref 65–99)
Globulin, Total: 2.4 g/dL (ref 1.5–4.5)
POTASSIUM: 4.1 mmol/L (ref 3.5–5.2)
Sodium: 135 mmol/L (ref 134–144)
Total Protein: 6.7 g/dL (ref 6.0–8.5)

## 2014-09-05 MED ORDER — LEVOTHYROXINE SODIUM 200 MCG PO TABS: 200.0000 ug | ORAL_TABLET | Freq: Every day | ORAL | Status: DC

## 2014-09-05 NOTE — Telephone Encounter (Signed)
-----   Message from Junie Spencer, FNP sent at 09/05/2014  8:21 AM EDT ----- HgbA1C elevated- Discussed during visit- Pt needs to take medications accordingly  Kidney and liver function stable LDL levels WNL, Triglycerides elevated- Pt needs to be on low fat diet Thyroid levels abnormal- Levothyroxine increased to 200 mcg- Pt needs to stop the 150 mcg!

## 2014-09-13 DIAGNOSIS — H1812 Bullous keratopathy, left eye: Secondary | ICD-10-CM | POA: Diagnosis not present

## 2014-09-13 DIAGNOSIS — H1820 Unspecified corneal edema: Secondary | ICD-10-CM | POA: Diagnosis not present

## 2014-09-13 DIAGNOSIS — Z947 Corneal transplant status: Secondary | ICD-10-CM | POA: Diagnosis not present

## 2014-09-13 DIAGNOSIS — E119 Type 2 diabetes mellitus without complications: Secondary | ICD-10-CM | POA: Diagnosis not present

## 2014-09-13 LAB — HM DIABETES EYE EXAM

## 2014-09-23 ENCOUNTER — Telehealth: Payer: Self-pay | Admitting: Family

## 2014-09-23 MED ORDER — HYDROCODONE-ACETAMINOPHEN 5-325 MG PO TABS: 1.0000 | ORAL_TABLET | Freq: Four times a day (QID) | ORAL | Status: DC | PRN

## 2014-09-23 NOTE — Telephone Encounter (Signed)
Prescription ready for pick up.

## 2014-10-03 ENCOUNTER — Telehealth: Payer: Self-pay | Admitting: Family

## 2014-10-03 NOTE — Telephone Encounter (Signed)
Explained that he would need to be seen in order to get a referral. Patient agreeable. Appt scheduled for tomorrow morning.

## 2014-10-04 ENCOUNTER — Encounter: Payer: Self-pay | Admitting: Family

## 2014-10-04 ENCOUNTER — Ambulatory Visit (INDEPENDENT_AMBULATORY_CARE_PROVIDER_SITE_OTHER): Payer: Medicare Other

## 2014-10-04 ENCOUNTER — Ambulatory Visit (INDEPENDENT_AMBULATORY_CARE_PROVIDER_SITE_OTHER): Payer: Medicare Other | Admitting: Family

## 2014-10-04 ENCOUNTER — Other Ambulatory Visit: Payer: Self-pay | Admitting: Family

## 2014-10-04 VITALS — BP 115/82 | HR 96 | Temp 97.4°F | Ht 67.0 in | Wt 253.6 lb

## 2014-10-04 DIAGNOSIS — W19XXXA Unspecified fall, initial encounter: Secondary | ICD-10-CM

## 2014-10-04 DIAGNOSIS — M545 Low back pain: Secondary | ICD-10-CM | POA: Diagnosis not present

## 2014-10-04 MED ORDER — CYCLOBENZAPRINE HCL 10 MG PO TABS: 10.0000 mg | ORAL_TABLET | Freq: Three times a day (TID) | ORAL | Status: DC | PRN

## 2014-10-04 MED ORDER — HYDROCODONE-ACETAMINOPHEN 5-325 MG PO TABS: 1.0000 | ORAL_TABLET | Freq: Four times a day (QID) | ORAL | Status: DC | PRN
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
## 2014-10-04 NOTE — Patient Instructions (Signed)
Back Pain, Adult Low back pain is very common. About 1 in 5 people have back pain.The cause of low back pain is rarely dangerous. The pain often gets better over time.About half of people with a sudden onset of back pain feel better in just 2 weeks. About 8 in 10 people feel better by 6 weeks.  CAUSES Some common causes of back pain include:  Strain of the muscles or ligaments supporting the spine.  Wear and tear (degeneration) of the spinal discs.  Arthritis.  Direct injury to the back. DIAGNOSIS Most of the time, the direct cause of low back pain is not known.However, back pain can be treated effectively even when the exact cause of the pain is unknown.Answering your caregiver's questions about your overall health and symptoms is one of the most accurate ways to make sure the cause of your pain is not dangerous. If your caregiver needs more information, he or she may order lab work or imaging tests (X-rays or MRIs).However, even if imaging tests show changes in your back, this usually does not require surgery. HOME CARE INSTRUCTIONS For many people, back pain returns.Since low back pain is rarely dangerous, it is often a condition that people can learn to manageon their own.   Remain active. It is stressful on the back to sit or stand in one place. Do not sit, drive, or stand in one place for more than 30 minutes at a time. Take short walks on level surfaces as soon as pain allows.Try to increase the length of time you walk each day.  Do not stay in bed.Resting more than 1 or 2 days can delay your recovery.  Do not avoid exercise or work.Your body is made to move.It is not dangerous to be active, even though your back may hurt.Your back will likely heal faster if you return to being active before your pain is gone.  Pay attention to your body when you bend and lift. Many people have less discomfortwhen lifting if they bend their knees, keep the load close to their bodies,and  avoid twisting. Often, the most comfortable positions are those that put less stress on your recovering back.  Find a comfortable position to sleep. Use a firm mattress and lie on your side with your knees slightly bent. If you lie on your back, put a pillow under your knees.  Only take over-the-counter or prescription medicines as directed by your caregiver. Over-the-counter medicines to reduce pain and inflammation are often the most helpful.Your caregiver may prescribe muscle relaxant drugs.These medicines help dull your pain so you can more quickly return to your normal activities and healthy exercise.  Put ice on the injured area.  Put ice in a plastic bag.  Place a towel between your skin and the bag.  Leave the ice on for 15-20 minutes, 03-04 times a day for the first 2 to 3 days. After that, ice and heat may be alternated to reduce pain and spasms.  Ask your caregiver about trying back exercises and gentle massage. This may be of some benefit.  Avoid feeling anxious or stressed.Stress increases muscle tension and can worsen back pain.It is important to recognize when you are anxious or stressed and learn ways to manage it.Exercise is a great option. SEEK MEDICAL CARE IF:  You have pain that is not relieved with rest or medicine.  You have pain that does not improve in 1 week.  You have new symptoms.  You are generally not feeling well. SEEK   IMMEDIATE MEDICAL CARE IF:   You have pain that radiates from your back into your legs.  You develop new bowel or bladder control problems.  You have unusual weakness or numbness in your arms or legs.  You develop nausea or vomiting.  You develop abdominal pain.  You feel faint. Document Released: 02/01/2005 Document Revised: 08/03/2011 Document Reviewed: 06/05/2013 Exit[employer] Patient Information 2015 [employer]. This information is not intended to replace advice given to you by your health care provider. Make sure you  discuss any questions you have with your health care provider.  

## 2014-10-07 ENCOUNTER — Telehealth: Payer: Self-pay | Admitting: Family

## 2014-10-10 DIAGNOSIS — S335XXA Sprain of ligaments of lumbar spine, initial encounter: Secondary | ICD-10-CM | POA: Diagnosis not present

## 2014-10-11 ENCOUNTER — Ambulatory Visit: Payer: Medicare Other | Admitting: Pharmacist

## 2014-10-16 ENCOUNTER — Telehealth: Payer: Self-pay | Admitting: Pharmacist

## 2014-10-16 NOTE — Telephone Encounter (Signed)
Patient's A1c was last 13.8%.  He missed last appt for diabetes education.  Tried to call to reschedule appt but was unable to leave message and patient's phone is not accepting calls.

## 2014-10-22 ENCOUNTER — Encounter (HOSPITAL_COMMUNITY): Payer: Self-pay | Admitting: Emergency Medicine

## 2014-10-22 ENCOUNTER — Emergency Department (HOSPITAL_COMMUNITY)
Admission: EM | Admit: 2014-10-22 | Discharge: 2014-10-22 | Disposition: A | Discharge status: Home / Self Care | Payer: Medicare Other | Attending: Emergency Medicine | Admitting: Emergency Medicine

## 2014-10-22 DIAGNOSIS — J45909 Unspecified asthma, uncomplicated: Secondary | ICD-10-CM | POA: Diagnosis not present

## 2014-10-22 DIAGNOSIS — E119 Type 2 diabetes mellitus without complications: Secondary | ICD-10-CM | POA: Diagnosis not present

## 2014-10-22 DIAGNOSIS — T2612XA Burn of cornea and conjunctival sac, left eye, initial encounter: Secondary | ICD-10-CM

## 2014-10-22 DIAGNOSIS — S0592XA Unspecified injury of left eye and orbit, initial encounter: Secondary | ICD-10-CM | POA: Diagnosis present

## 2014-10-22 DIAGNOSIS — Z794 Long term (current) use of insulin: Secondary | ICD-10-CM | POA: Insufficient documentation

## 2014-10-22 DIAGNOSIS — Z79899 Other long term (current) drug therapy: Secondary | ICD-10-CM | POA: Insufficient documentation

## 2014-10-22 DIAGNOSIS — E780 Pure hypercholesterolemia: Secondary | ICD-10-CM | POA: Insufficient documentation

## 2014-10-22 DIAGNOSIS — Y9389 Activity, other specified: Secondary | ICD-10-CM | POA: Diagnosis not present

## 2014-10-22 DIAGNOSIS — Y998 Other external cause status: Secondary | ICD-10-CM | POA: Diagnosis not present

## 2014-10-22 DIAGNOSIS — I10 Essential (primary) hypertension: Secondary | ICD-10-CM | POA: Insufficient documentation

## 2014-10-22 DIAGNOSIS — E669 Obesity, unspecified: Secondary | ICD-10-CM | POA: Diagnosis not present

## 2014-10-22 DIAGNOSIS — Y9289 Other specified places as the place of occurrence of the external cause: Secondary | ICD-10-CM | POA: Insufficient documentation

## 2014-10-22 DIAGNOSIS — Z8673 Personal history of transient ischemic attack (TIA), and cerebral infarction without residual deficits: Secondary | ICD-10-CM | POA: Insufficient documentation

## 2014-10-22 DIAGNOSIS — Z720 Tobacco use: Secondary | ICD-10-CM | POA: Insufficient documentation

## 2014-10-22 DIAGNOSIS — F419 Anxiety disorder, unspecified: Secondary | ICD-10-CM | POA: Insufficient documentation

## 2014-10-22 DIAGNOSIS — X12XXXA Contact with other hot fluids, initial encounter: Secondary | ICD-10-CM | POA: Insufficient documentation

## 2014-10-22 MED ORDER — OXYCODONE-ACETAMINOPHEN 5-325 MG PO TABS
1.0000 | ORAL_TABLET | Freq: Four times a day (QID) | ORAL | Status: DC | PRN
Start: 2014-10-22 — End: 2014-10-25

## 2014-10-22 MED ORDER — TETRACAINE HCL 0.5 % OP SOLN
1.0000 [drp] | Freq: Once | OPHTHALMIC | Status: AC
  Administered 2014-10-22: 1 [drp] via OPHTHALMIC

## 2014-10-22 MED ORDER — KETOROLAC TROMETHAMINE 0.5 % OP SOLN: 1.0000 [drp] | Freq: Four times a day (QID) | OPHTHALMIC | Status: DC

## 2014-10-22 MED ORDER — FLUORESCEIN SODIUM 1 MG OP STRP
1.0000 | ORAL_STRIP | Freq: Once | OPHTHALMIC | Status: AC
  Administered 2014-10-22: 1 via OPHTHALMIC

## 2014-10-22 MED ORDER — TETRACAINE HCL 0.5 % OP SOLN
OPHTHALMIC | Status: DC
Start: 2014-10-22 — End: 2014-10-22
  Filled 2014-10-22: qty 2

## 2014-10-22 MED ORDER — GENTAMICIN SULFATE 0.3 % OP SOLN: OPHTHALMIC | Status: DC

## 2014-10-22 MED ORDER — FLUORESCEIN SODIUM 1 MG OP STRP
ORAL_STRIP | OPHTHALMIC | Status: AC
  Administered 2014-10-22: 1 via OPHTHALMIC
  Filled 2014-10-22: qty 1
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
## 2014-10-22 NOTE — ED Notes (Signed)
MD at bedside. 
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
## 2014-10-22 NOTE — ED Notes (Signed)
Pt has had a feeling that something was in his eye for a few days, Sunday had grease splash in his eye when cooking. Yesterday eye pain , redness and swelling, worse today.

## 2014-10-22 NOTE — Discharge Instructions (Signed)
Acular eyedrops every 6 hours as needed as prescribed.  Gentamicin drops: 1 drop every hour while awake until seen by ophthalmology.  Follow-up with Dr. Bing Plume from ophthalmology tomorrow. The contact information for his office has been provided in this discharge summary. Call this afternoon to arrange this appointment.

## 2014-10-23 DIAGNOSIS — H16142 Punctate keratitis, left eye: Secondary | ICD-10-CM | POA: Diagnosis not present

## 2014-10-23 DIAGNOSIS — H16012 Central corneal ulcer, left eye: Secondary | ICD-10-CM | POA: Diagnosis not present

## 2014-10-24 ENCOUNTER — Telehealth: Payer: Self-pay | Admitting: Family

## 2014-10-24 DIAGNOSIS — H16012 Central corneal ulcer, left eye: Secondary | ICD-10-CM | POA: Diagnosis not present

## 2014-10-24 NOTE — Telephone Encounter (Signed)
Pt was prescribe oxycodone two days ago.
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
## 2014-10-25 ENCOUNTER — Ambulatory Visit (INDEPENDENT_AMBULATORY_CARE_PROVIDER_SITE_OTHER): Payer: Medicare Other | Admitting: Family

## 2014-10-25 ENCOUNTER — Encounter: Payer: Self-pay | Admitting: Family

## 2014-10-25 VITALS — BP 143/100 | HR 102 | Temp 97.0°F | Ht 67.0 in | Wt 246.0 lb

## 2014-10-25 DIAGNOSIS — H16002 Unspecified corneal ulcer, left eye: Secondary | ICD-10-CM | POA: Diagnosis not present

## 2014-10-25 DIAGNOSIS — T2612XD Burn of cornea and conjunctival sac, left eye, subsequent encounter: Secondary | ICD-10-CM | POA: Diagnosis not present

## 2014-10-25 DIAGNOSIS — H16142 Punctate keratitis, left eye: Secondary | ICD-10-CM | POA: Diagnosis not present

## 2014-10-25 DIAGNOSIS — H16012 Central corneal ulcer, left eye: Secondary | ICD-10-CM | POA: Diagnosis not present

## 2014-10-25 MED ORDER — HYDROCODONE-ACETAMINOPHEN 5-325 MG PO TABS: 1.0000 | ORAL_TABLET | Freq: Four times a day (QID) | ORAL | Status: DC | PRN

## 2014-10-25 MED ORDER — ALPRAZOLAM 0.5 MG PO TABS: 0.5000 mg | ORAL_TABLET | Freq: Three times a day (TID) | ORAL | Status: DC
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
## 2014-10-25 NOTE — Patient Instructions (Signed)
Corneal Ulcer A corneal ulcer is an open sore on the cornea. The cornea is the clear covering at the front and center of the eye.  CAUSES  Most corneal ulcers are caused by infection, but there are other causes as well.  Bacterial infection. A bacterial infection can occur and cause a corneal ulcer if:  Contact lenses are worn too long (especially overnight) or are not properly cared for.  An eye injury occurs, allowing bacteria to infect the area of injury.  Viral infection. A viral infection can occur and cause a corneal ulcer if:  The eye becomes infected with a virus, such as the herpes simplex (cold sore) virus, chickenpox virus, or shingles virus.  Fungal infection. A fungal infection can occur and cause a corneal ulcer if:  An eye injury resulted from contact with a plant or plant material.  An anti-inflammatory eye drop is overused.  You have a weakened immune system.  Contact lenses are improperly cared for or become infected.  Foreign bodies in the eye, such as sand, glass, or small pieces of glass or metal.  Dry eyes.  Certain disorders that prevent eyelids from closing completely, such as Bell's palsy.  Contact lenses, especially extended-wear soft contact lenses. Contact lenses can:  Scratch the cornea's surface, allowing bacteria to enter the scratch.  Trap dirt underneath the contact lens, which can scratch the cornea.  Harbor bacteria and fungi, making it more likely for bacterial infections to occur.  Block oxygen from the cornea, making it more likely for infections to occur. SYMPTOMS   Eye pain that is often severe.  Blurry vision.  Light sensitivity.  Pus or thick discharge coming from your eye.  Eye redness.  Feeling like something is in your eye.  Watery or itchy eye.  Burning or stinging feeling. Some ulcers that are very big may be seen as a white spot on the cornea. DIAGNOSIS  An eye exam will be performed. Your health care provider  may use a special kind of microscope (slit lamp) to look at the cornea. Eye drops may be put into the eye to make the ulcer easier to see. If it is suspected that an infection caused the corneal ulcer, tissue samples or cultures from the eye may be taken. Numbing eye drops will be given before any samples or cultures are taken. The samples or cultures will be examined in the lab to check for bacteria, viruses, or fungi. TREATMENT  Treatment of the corneal ulcer depends on the cause. If your ulcer is severe, you may be given antibiotic eye drops up until your health care provider knows the test results. Other treatments can include:  Antibacterial, antiviral, or antifungal eye drops or ointment.  Removing the foreign body that caused the eye injury.  Artificial tears or a bandage contact lens if severe dry eyes caused the corneal ulcer.  Over-the-counter or prescription pain medicine.  Steroidal eye drops if the eye is inflamed and swollen.  Antibiotic medicines by mouth.  An injection of medicine under the thin membrane covering the eyeball (conjunctiva). This allows medicine to reach the ulcer in high doses.  Eye patching to reduce irritation from blinking and bright light. An eye patch may not be given if the ulcer was caused by a bacterial infection. If the corneal ulcer causes a scar on the cornea that interferes with vision, hospitalization and surgery may be needed to replace the cornea (corneal transplant). HOME CARE INSTRUCTIONS   If prescribed, use your antibiotic   pills, eye drops, or ointment as directed. Continue using them even if you start to feel better. You may have to apply eye drops as often as every few minutes to every hour, for days. It may be necessary to set your alarm clock every few minutes to every hour during the night. This is absolutely necessary.  Only take over-the-counter or prescription medicines as directed by your health care provider.  Apply artificial  tears as needed if you have dry eyes.  Do not touch or rub your eye, because this may increase the irritation and spread the infection.  Avoid wearing eye makeup.  Stay in a dark room and use sunglasses if you have light sensitivity.  Apply cool packs to your eye to relieve discomfort and swelling.  If your eye is patched, you should not drive or use machinery. You will have reduced side vision and ability to judge distance.  Do not drive or operate machinery until approved by your health care provider. Your ability to judge distances is impaired.  Follow up with your health care provider as directed.  Do not wear contact lenses until your health care provider approves. If you normally wear contact lenses, follow these general rules to avoid the risk of a corneal ulcer:  Do not wear contact lenses while you sleep.  Wash your hands before removing contact lenses.  Properly sterilize and store your contact lenses.  Regularly clean your contact lens case.  Do not use your saliva or tap water to clean or wet your contact lenses.  Remove your contact lenses if your eye becomes irritated. You may put them back in once your eyes feel better. SEEK IMMEDIATE MEDICAL CARE IF:   You notice a change in your vision.  Your pain is getting worse, not better.  You have increasing discharge from the eye. MAKE SURE YOU:   Understand these instructions.  Will watch your condition.  Will get help right away if you are not doing well or get worse. Document Released: 03/11/2004 Document Revised: 10/04/2012 Document Reviewed: 07/04/2012 ExitCare Patient Information 2015 [employer]. This information is not intended to replace advice given to you by your health care provider. Make sure you discuss any questions you have with your health care provider.  

## 2014-10-29 DIAGNOSIS — H16012 Central corneal ulcer, left eye: Secondary | ICD-10-CM | POA: Diagnosis not present

## 2014-10-31 DIAGNOSIS — H16012 Central corneal ulcer, left eye: Secondary | ICD-10-CM | POA: Diagnosis not present

## 2014-11-05 DIAGNOSIS — H16012 Central corneal ulcer, left eye: Secondary | ICD-10-CM | POA: Diagnosis not present

## 2014-11-07 ENCOUNTER — Telehealth: Payer: Self-pay | Admitting: Family

## 2014-11-07 DIAGNOSIS — T2612XD Burn of cornea and conjunctival sac, left eye, subsequent encounter: Secondary | ICD-10-CM

## 2014-11-07 DIAGNOSIS — H16002 Unspecified corneal ulcer, left eye: Secondary | ICD-10-CM

## 2014-11-07 NOTE — Telephone Encounter (Signed)
He is too early on a refill and will need to discuss this further with Neysa Bonito when she returns. Charles Care, MD [facility] 11/07/2014, 12:45 PM

## 2014-11-07 NOTE — Telephone Encounter (Signed)
Christy"s pt, last seen and filled 10/25/14,

## 2014-11-08 ENCOUNTER — Other Ambulatory Visit: Payer: Self-pay | Admitting: Family

## 2014-11-08 DIAGNOSIS — S335XXD Sprain of ligaments of lumbar spine, subsequent encounter: Secondary | ICD-10-CM | POA: Diagnosis not present

## 2014-11-09 ENCOUNTER — Telehealth: Payer: Self-pay | Admitting: Family

## 2014-11-11 ENCOUNTER — Ambulatory Visit: Payer: Medicare Other | Admitting: Family Medicine

## 2014-11-11 NOTE — Telephone Encounter (Signed)
Last seen 10/25/14  Charles Pennington  If approved route to nurse to call into [facility]

## 2014-11-11 NOTE — Telephone Encounter (Signed)
Printed prescription Arville Care, MD [facility] 11/11/2014, 11:51 AM

## 2014-11-11 NOTE — Telephone Encounter (Signed)
rx called into pharmacy

## 2014-11-12 ENCOUNTER — Encounter: Payer: Self-pay | Admitting: Family

## 2014-11-12 ENCOUNTER — Telehealth: Payer: Self-pay | Admitting: Family

## 2014-11-12 NOTE — Telephone Encounter (Signed)
Please advise and route to [facility] 

## 2014-11-14 ENCOUNTER — Encounter: Payer: Self-pay | Admitting: *Deleted

## 2014-11-14 ENCOUNTER — Other Ambulatory Visit: Payer: Self-pay | Admitting: Family

## 2014-11-14 NOTE — Telephone Encounter (Signed)
Stop 3 days prior to procedure and start back after procedure

## 2014-11-14 NOTE — Telephone Encounter (Signed)
Letter faxed to [facility] with instructions to stop patient's plavix five days prior to procedures per Dr. Louanne Skye.

## 2014-11-14 NOTE — Telephone Encounter (Signed)
He is not my patient, and I have never seen him as he sees [facility]. I do not know the procedure that they are doing but from what I can tell he had a stroke when he was 49 years old which was quite some time ago and it would be fine to stop the Plavix before the procedure and then resume after when the surgeon okays him to resume it. I do not know how far before the surgery went want him to stop it but that is something we will have to discuss together. Arville Care, MD [facility] 11/14/2014, 9:32 AM

## 2014-11-26 ENCOUNTER — Telehealth: Payer: Self-pay | Admitting: Family

## 2014-11-26 DIAGNOSIS — H16002 Unspecified corneal ulcer, left eye: Secondary | ICD-10-CM

## 2014-11-26 DIAGNOSIS — T2612XD Burn of cornea and conjunctival sac, left eye, subsequent encounter: Secondary | ICD-10-CM

## 2014-11-26 MED ORDER — HYDROCODONE-ACETAMINOPHEN 5-325 MG PO TABS: 1.0000 | ORAL_TABLET | Freq: Four times a day (QID) | ORAL | Status: DC | PRN

## 2014-11-26 NOTE — Telephone Encounter (Signed)
RX ready for pick up 

## 2014-11-26 NOTE — Telephone Encounter (Signed)
Patient came by and got pain medication script.

## 2014-11-28 DIAGNOSIS — M533 Sacrococcygeal disorders, not elsewhere classified: Secondary | ICD-10-CM | POA: Diagnosis not present

## 2014-12-05 ENCOUNTER — Ambulatory Visit (INDEPENDENT_AMBULATORY_CARE_PROVIDER_SITE_OTHER): Payer: Medicare Other | Admitting: Family

## 2014-12-05 ENCOUNTER — Encounter: Payer: Self-pay | Admitting: Family

## 2014-12-05 VITALS — BP 134/94 | HR 83 | Temp 97.5°F | Ht 67.0 in | Wt 254.0 lb

## 2014-12-05 DIAGNOSIS — I10 Essential (primary) hypertension: Secondary | ICD-10-CM | POA: Diagnosis not present

## 2014-12-05 DIAGNOSIS — E119 Type 2 diabetes mellitus without complications: Secondary | ICD-10-CM | POA: Diagnosis not present

## 2014-12-05 DIAGNOSIS — E039 Hypothyroidism, unspecified: Secondary | ICD-10-CM | POA: Diagnosis not present

## 2014-12-05 DIAGNOSIS — F329 Major depressive disorder, single episode, unspecified: Secondary | ICD-10-CM

## 2014-12-05 DIAGNOSIS — F411 Generalized anxiety disorder: Secondary | ICD-10-CM

## 2014-12-05 DIAGNOSIS — Z794 Long term (current) use of insulin: Secondary | ICD-10-CM

## 2014-12-05 DIAGNOSIS — F32A Depression, unspecified: Secondary | ICD-10-CM

## 2014-12-05 DIAGNOSIS — H16002 Unspecified corneal ulcer, left eye: Secondary | ICD-10-CM

## 2014-12-05 DIAGNOSIS — E785 Hyperlipidemia, unspecified: Secondary | ICD-10-CM | POA: Diagnosis not present

## 2014-12-05 DIAGNOSIS — E1142 Type 2 diabetes mellitus with diabetic polyneuropathy: Secondary | ICD-10-CM | POA: Diagnosis not present

## 2014-12-05 DIAGNOSIS — I6350 Cerebral infarction due to unspecified occlusion or stenosis of unspecified cerebral artery: Secondary | ICD-10-CM

## 2014-12-05 DIAGNOSIS — J42 Unspecified chronic bronchitis: Secondary | ICD-10-CM | POA: Diagnosis not present

## 2014-12-05 DIAGNOSIS — F17200 Nicotine dependence, unspecified, uncomplicated: Secondary | ICD-10-CM

## 2014-12-05 DIAGNOSIS — Z23 Encounter for immunization: Secondary | ICD-10-CM | POA: Diagnosis not present

## 2014-12-05 DIAGNOSIS — T2612XD Burn of cornea and conjunctival sac, left eye, subsequent encounter: Secondary | ICD-10-CM

## 2014-12-05 DIAGNOSIS — K219 Gastro-esophageal reflux disease without esophagitis: Secondary | ICD-10-CM

## 2014-12-05 DIAGNOSIS — J4520 Mild intermittent asthma, uncomplicated: Secondary | ICD-10-CM

## 2014-12-05 LAB — POCT GLYCOSYLATED HEMOGLOBIN (HGB A1C): HEMOGLOBIN A1C: 12.6

## 2014-12-05 LAB — GLUCOSE, POCT (MANUAL RESULT ENTRY): POC GLUCOSE: 134 mg/dL — AB (ref 70–99)

## 2014-12-05 MED ORDER — SODIUM CHLORIDE (HYPERTONIC) 2 % OP SOLN: 1.0000 [drp] | OPHTHALMIC | Status: DC | PRN

## 2014-12-05 MED ORDER — ASPIRIN EC 81 MG PO TBEC: 81.0000 mg | DELAYED_RELEASE_TABLET | Freq: Every day | ORAL | Status: DC

## 2014-12-05 MED ORDER — BUDESONIDE-FORMOTEROL FUMARATE 80-4.5 MCG/ACT IN AERO: 2.0000 | INHALATION_SPRAY | Freq: Two times a day (BID) | RESPIRATORY_TRACT | Status: DC

## 2014-12-05 MED ORDER — LISINOPRIL 20 MG PO TABS: 20.0000 mg | ORAL_TABLET | Freq: Every day | ORAL | Status: DC

## 2014-12-05 MED ORDER — HYDROCODONE-ACETAMINOPHEN 5-325 MG PO TABS: 1.0000 | ORAL_TABLET | Freq: Four times a day (QID) | ORAL | Status: DC | PRN

## 2014-12-05 NOTE — Addendum Note (Signed)
Addended by: Prescott GumLAND, Delaina Fetsch M on: 12/05/2014 11:02 AM   Modules accepted: Orders, SmartSet

## 2014-12-05 NOTE — Patient Instructions (Addendum)
Health Maintenance, Male A healthy lifestyle and preventative care can promote health and wellness.  Maintain regular health, dental, and eye exams.  Eat a healthy diet. Foods like vegetables, fruits, whole grains, low-fat dairy products, and lean protein foods contain the nutrients you need and are low in calories. Decrease your intake of foods high in solid fats, added sugars, and salt. Get information about a proper diet from your health care provider, if necessary.  Regular physical exercise is one of the most important things you can do for your health. Most adults should get at least 150 minutes of moderate-intensity exercise (any activity that increases your heart rate and causes you to sweat) each week. In addition, most adults need muscle-strengthening exercises on 2 or more days a week.   Maintain a healthy weight. The body mass index (BMI) is a screening tool to identify possible weight problems. It provides an estimate of body fat based on height and weight. Your health care provider can find your BMI and can help you achieve or maintain a healthy weight. For males 20 years and older:  A BMI below 18.5 is considered underweight.  A BMI of 18.5 to 24.9 is normal.  A BMI of 25 to 29.9 is considered overweight.  A BMI of 30 and above is considered obese.  Maintain normal blood lipids and cholesterol by exercising and minimizing your intake of saturated fat. Eat a balanced diet with plenty of fruits and vegetables. Blood tests for lipids and cholesterol should begin at age 20 and be repeated every 5 years. If your lipid or cholesterol levels are high, you are over age 50, or you are at high risk for heart disease, you may need your cholesterol levels checked more frequently.Ongoing high lipid and cholesterol levels should be treated with medicines if diet and exercise are not working.  If you smoke, find out from your health care provider how to quit. If you do not use tobacco, do not  start.  Lung cancer screening is recommended for adults aged 55-80 years who are at high risk for developing lung cancer because of a history of smoking. A yearly low-dose CT scan of the lungs is recommended for people who have at least a 30-pack-year history of smoking and are current smokers or have quit within the past 15 years. A pack year of smoking is smoking an average of 1 pack of cigarettes a day for 1 year (for example, a 30-pack-year history of smoking could mean smoking 1 pack a day for 30 years or 2 packs a day for 15 years). Yearly screening should continue until the smoker has stopped smoking for at least 15 years. Yearly screening should be stopped for people who develop a health problem that would prevent them from having lung cancer treatment.  If you choose to drink alcohol, do not have more than 2 drinks per day. One drink is considered to be 12 oz (360 mL) of beer, 5 oz (150 mL) of wine, or 1.5 oz (45 mL) of liquor.  Avoid the use of street drugs. Do not share needles with anyone. Ask for help if you need support or instructions about stopping the use of drugs.  High blood pressure causes heart disease and increases the risk of stroke. High blood pressure is more likely to develop in:  People who have blood pressure in the end of the normal range (100-139/85-89 mm Hg).  People who are overweight or obese.  People who are African American.    If you are 18-39 years of age, have your blood pressure checked every 3-5 years. If you are 40 years of age or older, have your blood pressure checked every year. You should have your blood pressure measured twice--once when you are at a hospital or clinic, and once when you are not at a hospital or clinic. Record the average of the two measurements. To check your blood pressure when you are not at a hospital or clinic, you can use:  An automated blood pressure machine at a pharmacy.  A home blood pressure monitor.  If you are 45-79 years  old, ask your health care provider if you should take aspirin to prevent heart disease.  Diabetes screening involves taking a blood sample to check your fasting blood sugar level. This should be done once every 3 years after age 45 if you are at a normal weight and without risk factors for diabetes. Testing should be considered at a younger age or be carried out more frequently if you are overweight and have at least 1 risk factor for diabetes.  Colorectal cancer can be detected and often prevented. Most routine colorectal cancer screening begins at the age of 50 and continues through age 75. However, your health care provider may recommend screening at an earlier age if you have risk factors for colon cancer. On a yearly basis, your health care provider may provide home test kits to check for hidden blood in the stool. A small camera at the end of a tube may be used to directly examine the colon (sigmoidoscopy or colonoscopy) to detect the earliest forms of colorectal cancer. Talk to your health care provider about this at age 50 when routine screening begins. A direct exam of the colon should be repeated every 5-10 years through age 75, unless early forms of precancerous polyps or small growths are found.  People who are at an increased risk for hepatitis B should be screened for this virus. You are considered at high risk for hepatitis B if:  You were born in a country where hepatitis B occurs often. Talk with your health care provider about which countries are considered high risk.  Your parents were born in a high-risk country and you have not received a shot to protect against hepatitis B (hepatitis B vaccine).  You have HIV or AIDS.  You use needles to inject street drugs.  You live with, or have sex with, someone who has hepatitis B.  You are a man who has sex with other men (MSM).  You get hemodialysis treatment.  You take certain medicines for conditions like cancer, organ  transplantation, and autoimmune conditions.  Hepatitis C blood testing is recommended for all people born from 1945 through 1965 and any individual with known risk factors for hepatitis C.  Healthy men should no longer receive prostate-specific antigen (PSA) blood tests as part of routine cancer screening. Talk to your health care provider about prostate cancer screening.  Testicular cancer screening is not recommended for adolescents or adult males who have no symptoms. Screening includes self-exam, a health care provider exam, and other screening tests. Consult with your health care provider about any symptoms you have or any concerns you have about testicular cancer.  Practice safe sex. Use condoms and avoid high-risk sexual practices to reduce the spread of sexually transmitted infections (STIs).  You should be screened for STIs, including gonorrhea and chlamydia if:  You are sexually active and are younger than 24 years.  You   are older than 24 years, and your health care provider tells you that you are at risk for this type of infection.  Your sexual activity has changed since you were last screened, and you are at an increased risk for chlamydia or gonorrhea. Ask your health care provider if you are at risk.  If you are at risk of being infected with HIV, it is recommended that you take a prescription medicine daily to prevent HIV infection. This is called pre-exposure prophylaxis (PrEP). You are considered at risk if:  You are a man who has sex with other men (MSM).  You are a heterosexual man who is sexually active with multiple partners.  You take drugs by injection.  You are sexually active with a partner who has HIV.  Talk with your health care provider about whether you are at high risk of being infected with HIV. If you choose to begin PrEP, you should first be tested for HIV. You should then be tested every 3 months for as long as you are taking PrEP.  Use sunscreen. Apply  sunscreen liberally and repeatedly throughout the day. You should seek shade when your shadow is shorter than you. Protect yourself by wearing long sleeves, pants, a wide-brimmed hat, and sunglasses year round whenever you are outdoors.  Tell your health care provider of new moles or changes in moles, especially if there is a change in shape or color. Also, tell your health care provider if a mole is larger than the size of a pencil eraser.  A one-time screening for abdominal aortic aneurysm (AAA) and surgical repair of large AAAs by ultrasound is recommended for men aged 65-75 years who are current or former smokers.  Stay current with your vaccines (immunizations).   This information is not intended to replace advice given to you by your health care provider. Make sure you discuss any questions you have with your health care provider.   Document Released: 07/31/2007 Document Revised: 02/22/2014 Document Reviewed: 06/29/2010 Chronic Obstructive Pulmonary Disease Chronic obstructive pulmonary disease (COPD) is a common lung condition in which airflow from the lungs is limited. COPD is a general term that can be used to describe many different lung problems that limit airflow, including both chronic bronchitis and emphysema. If you have COPD, your lung function will probably never return to normal, but there are measures you can take to improve lung function and make yourself feel better. CAUSES   Smoking (common).  Exposure to secondhand smoke.  Genetic problems.  Chronic inflammatory lung diseases or recurrent infections. SYMPTOMS  Shortness of breath, especially with physical activity.  Deep, persistent (chronic) cough with a large amount of thick mucus.  Wheezing.  Rapid breaths (tachypnea).  Gray or bluish discoloration (cyanosis) of the skin, especially in your fingers, toes, or lips.  Fatigue.  Weight loss.  Frequent infections or episodes when breathing symptoms become  much worse (exacerbations).  Chest tightness. DIAGNOSIS Your health care provider will take a medical history and perform a physical examination to diagnose COPD. Additional tests for COPD may include:  Lung (pulmonary) function tests.  Chest X-ray.  CT scan.  Blood tests. TREATMENT  Treatment for COPD may include:  Inhaler and nebulizer medicines. These help manage the symptoms of COPD and make your breathing more comfortable.  Supplemental oxygen. Supplemental oxygen is only helpful if you have a low oxygen level in your blood.  Exercise and physical activity. These are beneficial for nearly all people with COPD.  Lung surgery  or transplant.  Nutrition therapy to gain weight, if you are underweight.  Pulmonary rehabilitation. This may involve working with a team of health care providers and specialists, such as respiratory, occupational, and physical therapists. HOME CARE INSTRUCTIONS  Take all medicines (inhaled or pills) as directed by your health care provider.  Avoid over-the-counter medicines or cough syrups that dry up your airway (such as antihistamines) and slow down the elimination of secretions unless instructed otherwise by your health care provider.  If you are a smoker, the most important thing that you can do is stop smoking. Continuing to smoke will cause further lung damage and breathing trouble. Ask your health care provider for help with quitting smoking. He or she can direct you to community resources or hospitals that provide support.  Avoid exposure to irritants such as smoke, chemicals, and fumes that aggravate your breathing.  Use oxygen therapy and pulmonary rehabilitation if directed by your health care provider. If you require home oxygen therapy, ask your health care provider whether you should purchase a pulse oximeter to measure your oxygen level at home.  Avoid contact with individuals who have a contagious illness.  Avoid extreme temperature  and humidity changes.  Eat healthy foods. Eating smaller, more frequent meals and resting before meals may help you maintain your strength.  Stay active, but balance activity with periods of rest. Exercise and physical activity will help you maintain your ability to do things you want to do.  Preventing infection and hospitalization is very important when you have COPD. Make sure to receive all the vaccines your health care provider recommends, especially the pneumococcal and influenza vaccines. Ask your health care provider whether you need a pneumonia vaccine.  Learn and use relaxation techniques to manage stress.  Learn and use controlled breathing techniques as directed by your health care provider. Controlled breathing techniques include:  Pursed lip breathing. Start by breathing in (inhaling) through your nose for 1 second. Then, purse your lips as if you were going to whistle and breathe out (exhale) through the pursed lips for 2 seconds.  Diaphragmatic breathing. Start by putting one hand on your abdomen just above your waist. Inhale slowly through your nose. The hand on your abdomen should move out. Then purse your lips and exhale slowly. You should be able to feel the hand on your abdomen moving in as you exhale.  Learn and use controlled coughing to clear mucus from your lungs. Controlled coughing is a series of short, progressive coughs. The steps of controlled coughing are: 1. Lean your head slightly forward. 2. Breathe in deeply using diaphragmatic breathing. 3. Try to hold your breath for 3 seconds. 4. Keep your mouth slightly open while coughing twice. 5. Spit any mucus out into a tissue. 6. Rest and repeat the steps once or twice as needed. SEEK MEDICAL CARE IF:  You are coughing up more mucus than usual.  There is a change in the color or thickness of your mucus.  Your breathing is more labored than usual.  Your breathing is faster than usual. SEEK IMMEDIATE MEDICAL  CARE IF:  You have shortness of breath while you are resting.  You have shortness of breath that prevents you from:  Being able to talk.  Performing your usual physical activities.  You have chest pain lasting longer than 5 minutes.  Your skin color is more cyanotic than usual.  You measure low oxygen saturations for longer than 5 minutes with a pulse oximeter. MAKE SURE YOU:  Understand these instructions.  Will watch your condition.  Will get help right away if you are not doing well or get worse.   This information is not intended to replace advice given to you by your health care provider. Make sure you discuss any questions you have with your health care provider.   Document Released: 11/11/2004 Document Revised: 02/22/2014 Document Reviewed: 09/28/2012 [employer] Interactive Patient Education 2016 [employer] Interactive Patient Education [employer]

## 2014-12-05 NOTE — Progress Notes (Signed)
Subjective:    Patient ID: Charles Pennington, male    DOB: 12/27/1965, 49 y.o.   MRN: [id number]  PT presents to the office today for chronic follow up.  Diabetes He presents for his follow-up diabetic visit. He has type 2 diabetes mellitus. His disease course has been fluctuating. Hypoglycemia symptoms include nervousness/anxiousness. Pertinent negatives for hypoglycemia include no confusion, dizziness, headaches or sleepiness. Associated symptoms include visual change. Pertinent negatives for diabetes include no blurred vision, no fatigue, no foot paresthesias and no foot ulcerations. There are no hypoglycemic complications. Symptoms are stable. Diabetic complications include a CVA and peripheral neuropathy. Pertinent negatives for diabetic complications include no heart disease or nephropathy. Risk factors for coronary artery disease include diabetes mellitus, dyslipidemia, hypertension, male sex and tobacco exposure. Current diabetic treatment includes insulin injections and oral agent (monotherapy). He is compliant with treatment all of the time. His weight is stable. He is following a generally healthy diet. He participates in exercise three times a week. His breakfast blood glucose range is generally 180-200 mg/dl. Eye exam is current.  Hypertension This is a chronic problem. The current episode started more than 1 year ago. The problem has been waxing and waning since onset. The problem is uncontrolled. Associated symptoms include anxiety. Pertinent negatives include no blurred vision, headaches, malaise/fatigue, palpitations, peripheral edema or shortness of breath. Risk factors for coronary artery disease include diabetes mellitus, dyslipidemia, male gender, obesity and smoking/tobacco exposure. Past treatments include calcium channel blockers. The current treatment provides mild improvement. Hypertensive end-organ damage includes CVA and a thyroid problem. There is no history of kidney disease,  CAD/MI or heart failure. There is no history of sleep apnea.  Hyperlipidemia This is a chronic problem. The current episode started more than 1 year ago. The problem is uncontrolled. Recent lipid tests were reviewed and are high. Exacerbating diseases include diabetes and hypothyroidism. Factors aggravating his hyperlipidemia include smoking. Pertinent negatives include no leg pain, myalgias or shortness of breath. Current antihyperlipidemic treatment includes statins. The current treatment provides significant improvement of lipids. Risk factors for coronary artery disease include diabetes mellitus, dyslipidemia, hypertension, male sex and obesity.  Thyroid Problem Presents for follow-up visit. Symptoms include anxiety, depressed mood and visual change. Patient reports no constipation, diarrhea, dry skin, fatigue, leg swelling, nail problem or palpitations. The symptoms have been stable. Past treatments include levothyroxine. The treatment provided significant relief. His past medical history is significant for diabetes and hyperlipidemia. There is no history of heart failure.  Anxiety Presents for follow-up visit. Symptoms include depressed mood and nervous/anxious behavior. Patient reports no confusion, dizziness, insomnia, nausea, palpitations or shortness of breath. Symptoms occur rarely. The quality of sleep is good. Nighttime awakenings: none.   His past medical history is significant for anxiety/panic attacks and depression. There is no history of arrhythmia, CAD, CHF or fibromyalgia. Past treatments include SSRIs and benzodiazephines.  Gastroesophageal Reflux He reports no belching, no coughing, no heartburn, no nausea, no sore throat or no tooth decay. This is a chronic problem. The current episode started more than 1 year ago. The problem occurs rarely. The problem has been resolved. Pertinent negatives include no fatigue. Risk factors include caffeine use. He has tried a PPI for the symptoms.  The treatment provided significant relief.      Review of Systems  Constitutional: Negative.  Negative for malaise/fatigue and fatigue.  HENT: Negative.  Negative for sore throat.   Eyes: Negative for blurred vision.  Respiratory: Negative.  Negative for cough  and shortness of breath.   Cardiovascular: Negative.  Negative for palpitations.  Gastrointestinal: Negative.  Negative for heartburn, nausea, diarrhea and constipation.  Endocrine: Negative.   Genitourinary: Negative.   Musculoskeletal: Negative.  Negative for myalgias.  Neurological: Negative.  Negative for dizziness and headaches.  Hematological: Negative.   Psychiatric/Behavioral: Negative for confusion. The patient is nervous/anxious. The patient does not have insomnia.   All other systems reviewed and are negative.      Objective:   Physical Exam  Constitutional: He is oriented to person, place, and time. He appears well-developed and well-nourished. No distress.  HENT:  Head: Normocephalic.  Right Ear: External ear normal.  Left Ear: External ear normal.  Nose: Nose normal.  Mouth/Throat: Oropharynx is clear and moist.  Eyes: Pupils are equal, round, and reactive to light. Right eye exhibits no discharge. Left eye exhibits no discharge.  Neck: Normal range of motion. Neck supple. No thyromegaly present.  Cardiovascular: Normal rate, regular rhythm, normal heart sounds and intact distal pulses.   No murmur heard. Pulmonary/Chest: He has wheezes.  Nonproductive cough, coarse cough  Abdominal: Soft. Bowel sounds are normal. He exhibits no distension. There is no tenderness.  Musculoskeletal: Normal range of motion. He exhibits no edema or tenderness.  Neurological: He is alert and oriented to person, place, and time. He has normal reflexes. No cranial nerve deficit.  Skin: Skin is warm and dry. No rash noted. No erythema.  Psychiatric: He has a normal mood and affect. His behavior is normal. Judgment and thought  content normal.  Vitals reviewed.     BP 134/94 mmHg  Pulse 83  Temp(Src) 97.5 F (36.4 C) (Oral)  Ht 5' 7"  (1.702 m)  Wt 254 lb (115.214 kg)  BMI 39.77 kg/m2     Assessment & Plan:  1. Cerebral artery occlusion with cerebral infarction ([place]) - CMP14+EGFR  2. Asthma, mild intermittent, uncomplicated - NIO27+OJJK  3. Essential hypertension -Lisinopril 20 mg added today --Daily blood pressure log given with instructions on how to fill out and told to bring to next visit -Dash diet information given -Exercise encouraged - Stress Management  -Continue current meds -RTO in 2 weeks - CMP14+EGFR - lisinopril (PRINIVIL,ZESTRIL) 20 MG tablet; Take 1 tablet (20 mg total) by mouth daily.  Dispense: 90 tablet; Refill: 3  4. Gastroesophageal reflux disease, esophagitis presence not specified - CMP14+EGFR  5. Diabetic polyneuropathy associated with type 2 diabetes mellitus (HCC)  - CMP14+EGFR  6. Type 2 diabetes mellitus treated with insulin (HCC - POCT glycosylated hemoglobin (Hb A1C) - CMP14+EGFR  7. Hypothyroidism, unspecified hypothyroidism type - CMP14+EGFR - Thyroid Panel With TSH  8. Depression - CMP14+EGFR  9. GAD (generalized anxiety disorder) - CMP14+EGFR  10. Hyperlipidemia - CMP14+EGFR - Lipid panel  11. SMOKER - CMP14+EGFR  12. Chronic bronchitis, unspecified chronic bronchitis type ([place]) -Smoking cessation discussed - budesonide-formoterol (SYMBICORT) 80-4.5 MCG/ACT inhaler; Inhale 2 puffs into the lungs 2 (two) times daily.  Dispense: 1 Inhaler; Refill: 3    Continue all meds Labs pending Health Maintenance reviewed- Pneumonia vaccine Diet and exercise encouraged RTO 2 weeks for HTN recheck   Evelina Dun, FNP

## 2014-12-05 NOTE — Addendum Note (Signed)
Addended by: Almeta MonasSTONE, JANIE M on: 12/05/2014 10:43 AM   Modules accepted: Orders

## 2014-12-05 NOTE — Addendum Note (Signed)
Addended by: Prescott GumLAND, Jenisis Harmsen M on: 12/05/2014 10:44 AM   Modules accepted: Kipp BroodSmartSet

## 2014-12-06 ENCOUNTER — Other Ambulatory Visit: Payer: Self-pay | Admitting: Family

## 2014-12-06 LAB — CMP14+EGFR
A/G RATIO: 1.7 (ref 1.1–2.5)
ALT: 18 IU/L (ref 0–44)
AST: 15 IU/L (ref 0–40)
Albumin: 4.4 g/dL (ref 3.5–5.5)
Alkaline Phosphatase: 78 IU/L (ref 39–117)
BUN/Creatinine Ratio: 17 (ref 9–20)
BUN: 16 mg/dL (ref 6–24)
Bilirubin Total: <0.2 mg/dL (ref 0.0–1.2)
CALCIUM: 9.7 mg/dL (ref 8.7–10.2)
CO2: 28 mmol/L (ref 18–29)
Chloride: 93 mmol/L — ABNORMAL LOW (ref 97–106)
Creatinine, Ser: 0.96 mg/dL (ref 0.76–1.27)
GFR, EST AFRICAN AMERICAN: 108 mL/min/{1.73_m2} (ref >59)
GFR, EST NON AFRICAN AMERICAN: 93 mL/min/{1.73_m2} (ref >59)
GLOBULIN, TOTAL: 2.6 g/dL (ref 1.5–4.5)
Glucose: 147 mg/dL — ABNORMAL HIGH (ref 65–99)
POTASSIUM: 4.4 mmol/L (ref 3.5–5.2)
Sodium: 138 mmol/L (ref 136–144)
TOTAL PROTEIN: 7 g/dL (ref 6.0–8.5)

## 2014-12-06 LAB — LIPID PANEL
CHOL/HDL RATIO: 5.2 ratio — AB (ref 0.0–5.0)
Cholesterol, Total: 251 mg/dL — ABNORMAL HIGH (ref 100–199)
HDL: 48 mg/dL (ref >39)
LDL Calculated: 182 mg/dL — ABNORMAL HIGH (ref 0–99)
Triglycerides: 107 mg/dL (ref 0–149)
VLDL Cholesterol Cal: 21 mg/dL (ref 5–40)

## 2014-12-06 LAB — THYROID PANEL WITH TSH
FREE THYROXINE INDEX: 2.5 (ref 1.2–4.9)
T3 Uptake Ratio: 31 % (ref 24–39)
T4, Total: 8 ug/dL (ref 4.5–12.0)
TSH: 1.2 u[IU]/mL (ref 0.450–4.500)

## 2014-12-06 MED ORDER — FLUTICASONE-SALMETEROL 100-50 MCG/DOSE IN AEPB: 1.0000 | INHALATION_SPRAY | Freq: Two times a day (BID) | RESPIRATORY_TRACT | Status: DC

## 2014-12-13 DIAGNOSIS — Z01812 Encounter for preprocedural laboratory examination: Secondary | ICD-10-CM | POA: Diagnosis not present

## 2014-12-13 DIAGNOSIS — S335XXD Sprain of ligaments of lumbar spine, subsequent encounter: Secondary | ICD-10-CM | POA: Diagnosis not present

## 2014-12-19 ENCOUNTER — Encounter: Payer: Self-pay | Admitting: Family

## 2014-12-19 ENCOUNTER — Ambulatory Visit (INDEPENDENT_AMBULATORY_CARE_PROVIDER_SITE_OTHER): Payer: Medicare Other | Admitting: Family

## 2014-12-19 VITALS — BP 121/84 | HR 104 | Temp 97.7°F | Ht 67.0 in | Wt 252.0 lb

## 2014-12-19 DIAGNOSIS — E1142 Type 2 diabetes mellitus with diabetic polyneuropathy: Secondary | ICD-10-CM

## 2014-12-19 DIAGNOSIS — I6350 Cerebral infarction due to unspecified occlusion or stenosis of unspecified cerebral artery: Secondary | ICD-10-CM | POA: Diagnosis not present

## 2014-12-19 DIAGNOSIS — Z794 Long term (current) use of insulin: Secondary | ICD-10-CM

## 2014-12-19 DIAGNOSIS — I10 Essential (primary) hypertension: Secondary | ICD-10-CM

## 2014-12-19 DIAGNOSIS — T2612XD Burn of cornea and conjunctival sac, left eye, subsequent encounter: Secondary | ICD-10-CM

## 2014-12-19 DIAGNOSIS — E119 Type 2 diabetes mellitus without complications: Secondary | ICD-10-CM

## 2014-12-19 DIAGNOSIS — H16002 Unspecified corneal ulcer, left eye: Secondary | ICD-10-CM

## 2014-12-19 MED ORDER — HYDROCODONE-ACETAMINOPHEN 5-325 MG PO TABS: 1.0000 | ORAL_TABLET | Freq: Four times a day (QID) | ORAL | Status: DC | PRN

## 2014-12-19 MED ORDER — DULAGLUTIDE 0.75 MG/0.5ML ~~LOC~~ SOAJ: SUBCUTANEOUS | Status: DC

## 2014-12-19 NOTE — Progress Notes (Signed)
Subjective:    Patient ID: Charles Pennington, male    DOB: [birth year], 49 y.o.   MRN: [id number]  Pt presents to the office today to recheck HTN. Pt's BP is at goal today!! Hypertension This is a chronic problem. The current episode started more than 1 year ago. The problem has been resolved since onset. The problem is controlled. Pertinent negatives include no headaches or peripheral edema. Risk factors for coronary artery disease include dyslipidemia, male gender, obesity, smoking/tobacco exposure and sedentary lifestyle. Past treatments include calcium channel blockers and ACE inhibitors. The current treatment provides significant improvement. Hypertensive end-organ damage includes CVA. There is no history of kidney disease or CAD/MI. There is no history of sleep apnea.  Diabetes He presents for his follow-up diabetic visit. He has type 2 diabetes mellitus. His disease course has been fluctuating. Pertinent negatives for hypoglycemia include no headaches or sleepiness. Pertinent negatives for diabetes include no foot paresthesias and no foot ulcerations. There are no hypoglycemic complications. Symptoms are stable. Diabetic complications include a CVA and peripheral neuropathy. Pertinent negatives for diabetic complications include no heart disease or nephropathy. Risk factors for coronary artery disease include diabetes mellitus, dyslipidemia, hypertension, male sex and tobacco exposure. Current diabetic treatment includes insulin injections and oral agent (monotherapy). He is compliant with treatment all of the time. His weight is stable. He is following a generally healthy diet. He participates in exercise three times a week. His breakfast blood glucose range is generally 130-140 mg/dl. An ACE inhibitor/angiotensin II receptor blocker is being taken. Eye exam is current.      Review of Systems  Constitutional: Negative.   HENT: Negative.   Respiratory: Negative.   Cardiovascular: Negative.    Gastrointestinal: Negative.   Endocrine: Negative.   Genitourinary: Negative.   Musculoskeletal: Negative.   Neurological: Negative.  Negative for headaches.  Hematological: Negative.   Psychiatric/Behavioral: Negative.   All other systems reviewed and are negative.      Objective:   Physical Exam  Constitutional: He is oriented to person, place, and time. He appears well-developed and well-nourished. No distress.  HENT:  Head: Normocephalic.  Eyes: Pupils are equal, round, and reactive to light. Right eye exhibits no discharge. Left eye exhibits no discharge.  Neck: Normal range of motion. Neck supple. No thyromegaly present.  Cardiovascular: Normal rate, regular rhythm, normal heart sounds and intact distal pulses.   No murmur heard. Pulmonary/Chest: Effort normal and breath sounds normal. No respiratory distress. He has no wheezes.  Abdominal: Soft. Bowel sounds are normal. He exhibits no distension. There is no tenderness.  Musculoskeletal: Normal range of motion. He exhibits no edema or tenderness.  Neurological: He is alert and oriented to person, place, and time. He has normal reflexes. No cranial nerve deficit.  Skin: Skin is warm and dry. No rash noted. No erythema.  Psychiatric: He has a normal mood and affect. His behavior is normal. Judgment and thought content normal.  Vitals reviewed.    BP 121/84 mmHg  Pulse 104  Temp(Src) 97.7 F (36.5 C) (Oral)  Ht 5' 7"  (1.702 m)  Wt 252 lb (114.306 kg)  BMI 39.46 kg/m2      Assessment & Plan:  1. Essential hypertension -Dash diet information given -Exercise encouraged - Stress Management  -Continue current meds -RTO in 3 months  - BMP8+EGFR  2. Diabetic polyneuropathy associated with type 2 diabetes mellitus (HCC) - BMP8+EGFR  3. Type 2 diabetes mellitus treated with insulin (HCC) -Pt needs to be on  low carb diet -Educated pt on importance of taking medication everyday!! Trulicity started today- Pt educated  on how to use! -Pt to schedule appt with Tammy asap! -RTO 3 month  - BMP8+EGFR - Dulaglutide (TRULICITY) 3.12 JG/8.7VX SOPN; One injection of 0.26m  weekly  Dispense: 12 pen; Refill: 1[place] FNP

## 2014-12-19 NOTE — Patient Instructions (Signed)

## 2014-12-19 NOTE — Addendum Note (Signed)
Addended by: Prescott GumLAND, Kenric Ginger M on: 12/19/2014 11:45 AM   Modules accepted: Kipp BroodSmartSet

## 2014-12-20 LAB — BMP8+EGFR
BUN/Creatinine Ratio: 16 (ref 9–20)
BUN: 16 mg/dL (ref 6–24)
CALCIUM: 9.5 mg/dL (ref 8.7–10.2)
CO2: 26 mmol/L (ref 18–29)
CREATININE: 1.02 mg/dL (ref 0.76–1.27)
Chloride: 87 mmol/L — ABNORMAL LOW (ref 97–106)
GFR, EST AFRICAN AMERICAN: 100 mL/min/{1.73_m2} (ref >59)
GFR, EST NON AFRICAN AMERICAN: 87 mL/min/{1.73_m2} (ref >59)
Glucose: 330 mg/dL — ABNORMAL HIGH (ref 65–99)
Potassium: 4.7 mmol/L (ref 3.5–5.2)
Sodium: 131 mmol/L — ABNORMAL LOW (ref 136–144)

## 2014-12-22 ENCOUNTER — Other Ambulatory Visit: Payer: Self-pay | Admitting: Family

## 2014-12-23 ENCOUNTER — Telehealth: Payer: Self-pay | Admitting: Family

## 2014-12-23 DIAGNOSIS — E119 Type 2 diabetes mellitus without complications: Secondary | ICD-10-CM

## 2014-12-23 DIAGNOSIS — Z794 Long term (current) use of insulin: Secondary | ICD-10-CM

## 2014-12-23 DIAGNOSIS — E1142 Type 2 diabetes mellitus with diabetic polyneuropathy: Secondary | ICD-10-CM

## 2014-12-23 MED ORDER — INSULIN PEN NEEDLE 31G X 4 MM MISC: 1.0000 "application " | Freq: Two times a day (BID) | Status: DC

## 2014-12-23 NOTE — Telephone Encounter (Signed)
done

## 2014-12-26 ENCOUNTER — Other Ambulatory Visit: Payer: Self-pay | Admitting: Family

## 2014-12-27 ENCOUNTER — Telehealth: Payer: Self-pay | Admitting: Family

## 2014-12-27 DIAGNOSIS — M545 Low back pain: Secondary | ICD-10-CM

## 2014-12-27 DIAGNOSIS — M25559 Pain in unspecified hip: Secondary | ICD-10-CM

## 2014-12-27 NOTE — Telephone Encounter (Signed)
Ortho referral sent 

## 2015-01-01 ENCOUNTER — Ambulatory Visit: Payer: Medicare Other | Admitting: Pharmacist

## 2015-01-03 ENCOUNTER — Telehealth: Payer: Self-pay | Admitting: Family

## 2015-01-03 DIAGNOSIS — T2612XD Burn of cornea and conjunctival sac, left eye, subsequent encounter: Secondary | ICD-10-CM

## 2015-01-03 DIAGNOSIS — H16002 Unspecified corneal ulcer, left eye: Secondary | ICD-10-CM

## 2015-01-03 MED ORDER — HYDROCODONE-ACETAMINOPHEN 5-325 MG PO TABS: 1.0000 | ORAL_TABLET | Freq: Four times a day (QID) | ORAL | Status: DC | PRN

## 2015-01-03 NOTE — Telephone Encounter (Signed)
RX ready for pick up 

## 2015-01-03 NOTE — Telephone Encounter (Signed)
Patient aware that rx is ready to be picked up.  

## 2015-01-15 ENCOUNTER — Encounter: Payer: Self-pay | Admitting: Family Medicine

## 2015-01-15 ENCOUNTER — Ambulatory Visit (INDEPENDENT_AMBULATORY_CARE_PROVIDER_SITE_OTHER): Payer: Medicare Other | Admitting: Family Medicine

## 2015-01-15 VITALS — Temp 98.0°F | Ht 67.0 in | Wt 254.0 lb

## 2015-01-15 DIAGNOSIS — I10 Essential (primary) hypertension: Secondary | ICD-10-CM | POA: Diagnosis not present

## 2015-01-15 DIAGNOSIS — M961 Postlaminectomy syndrome, not elsewhere classified: Secondary | ICD-10-CM | POA: Diagnosis not present

## 2015-01-15 DIAGNOSIS — H6122 Impacted cerumen, left ear: Secondary | ICD-10-CM

## 2015-01-15 DIAGNOSIS — H60392 Other infective otitis externa, left ear: Secondary | ICD-10-CM | POA: Diagnosis not present

## 2015-01-15 DIAGNOSIS — I6350 Cerebral infarction due to unspecified occlusion or stenosis of unspecified cerebral artery: Secondary | ICD-10-CM | POA: Diagnosis not present

## 2015-01-15 MED ORDER — CARBAMIDE PEROXIDE 6.5 % OT SOLN: 5.0000 [drp] | Freq: Every day | OTIC | Status: DC

## 2015-01-15 MED ORDER — OFLOXACIN 0.3 % OT SOLN: 5.0000 [drp] | Freq: Every day | OTIC | Status: DC

## 2015-01-15 NOTE — Patient Instructions (Signed)
PT. Advised to follow up with Ms. [name] for opiate eval & possible refill.

## 2015-01-15 NOTE — Progress Notes (Signed)
Subjective:  Patient ID: Charles Pennington, male    DOB: 1965-03-16  Age: 49 y.o. MRN: [id number]  CC: Otalgia   HPI Charles Pennington presents for 2 days of increasing left ear pain. Feels like the hearing is lessened. Ear canal feels dry to him. He has had some minor sinus congestion but he has no fever chills or sweats. He has not had any posterior drainage or rhinorrhea no sore throat. Denies cough. The right ear feels fine.  Patient is also out of his hydrocodone. He would like to have that refilled. He says he ran out yesterday he has used 45 pills since the third of this month, 27 days.  History Charles Pennington has a past medical history of Diabetes mellitus; Obesity; Hypertension; Hypothyroidism; Vertigo; Low back pain; SOB (shortness of breath); Asthma; Hypercholesteremia; Anxiety; and Stroke [facility]) (age 87).   He has past surgical history that includes Wrist surgery; Spine surgery; and Foot surgery.   His family history includes Diabetes in his father and mother; Hypertension in his father; Suicidality in his father.He reports that he has been smoking Cigarettes.  He has been smoking about 0.50 packs per day. He has never used smokeless tobacco. He reports that he drinks alcohol. He reports that he does not use illicit drugs.  Outpatient Prescriptions Prior to Visit  Medication Sig Dispense Refill  . amLODipine (NORVASC) 5 MG tablet TAKE 1 TABLET (5 MG TOTAL) BY MOUTH DAILY. 90 tablet 3  . aspirin EC 81 MG tablet Take 1 tablet (81 mg total) by mouth daily. 90 tablet 1  . atorvastatin (LIPITOR) 80 MG tablet Take 1 tablet (80 mg total) by mouth daily. 90 tablet 3  . Dulaglutide (TRULICITY) 0.75 MG/0.5ML SOPN One injection of 0.75mg   weekly 12 pen 1  . fenofibrate 160 MG tablet TAKE 1 TABLET (160 MG TOTAL) BY MOUTH DAILY. 30 tablet 3  . Glucose Blood (BLOOD GLUCOSE TEST STRIPS) STRP Use to check BG up to BID.  Dx:  250.02 100 each 2  . Insulin Glargine (LANTUS SOLOSTAR) 100 UNIT/ML  Solostar Pen INJECT 45 UNITS INTO THE SKIN 2 (TWO) TIMES DAILY. 5 pen 6  . Insulin Pen Needle 31G X 4 MM MISC 1 application by Does not apply route 2 (two) times daily. 100 each 3  . LANTUS SOLOSTAR 100 UNIT/ML Solostar Pen INJECT 45 UNITS INTO THE SKIN 2 (TWO) TIMES DAILY. 30 mL 2  . levothyroxine (SYNTHROID) 200 MCG tablet Take 1 tablet (200 mcg total) by mouth daily before breakfast. 90 tablet 1  . lisinopril (PRINIVIL,ZESTRIL) 20 MG tablet Take 1 tablet (20 mg total) by mouth daily. 90 tablet 3  . omeprazole (PRILOSEC) 20 MG capsule TAKE 1 CAPSULE (20 MG TOTAL) BY MOUTH DAILY. 90 capsule 3  . PARoxetine (PAXIL) 40 MG tablet Take 1 tablet (40 mg total) by mouth daily. 90 tablet 3  . zolpidem (AMBIEN) 10 MG tablet TAKE 1 TABLET AT BEDTIME AS NEEDED FOR SLEEP 90 tablet 1  . ALPRAZolam (XANAX) 0.5 MG tablet Take 1 tablet (0.5 mg total) by mouth 3 (three) times daily. (Patient not taking: Reported on 01/16/2015) 90 tablet 2  . canagliflozin (INVOKANA) 300 MG TABS tablet Take 300 mg by mouth daily before breakfast. 90 tablet 2  . Fluticasone-Salmeterol (ADVAIR DISKUS) 100-50 MCG/DOSE AEPB Inhale 1 puff into the lungs 2 (two) times daily. 60 each 3  . pregabalin (LYRICA) 50 MG capsule Take 1 capsule (50 mg total) by mouth 2 (two) times daily.  180 capsule 1  . PROAIR HFA 108 (90 BASE) MCG/ACT inhaler INHALE 2 PUFFS INTO THE LUNGS EVERY 6 (SIX) HOURS AS NEEDED FOR WHEEZING. 8.5 Inhaler 2  . HYDROcodone-acetaminophen (NORCO/VICODIN) 5-325 MG tablet Take 1 tablet by mouth every 6 (six) hours as needed. (Patient not taking: Reported on 01/15/2015) 45 tablet 0   No facility-administered medications prior to visit.    ROS Review of Systems  Constitutional: Negative for fever, chills and diaphoresis.  HENT: Positive for ear pain and hearing loss. Negative for rhinorrhea and sore throat.   Respiratory: Negative for cough and shortness of breath.   Cardiovascular: Negative for chest pain.    Gastrointestinal: Negative for abdominal pain.  Musculoskeletal: Positive for myalgias and arthralgias.  Skin: Negative for rash.  Neurological: Negative for weakness and headaches.    Objective:  Temp(Src) 98 F (36.7 C) (Oral)  Ht  (1.702 m)  Wt 254 lb (115.214 kg)  BMI 39.77 kg/m2  SpO2 98%  BP Readings from Last 3 Encounters:  01/16/15 133/94  12/19/14 121/84  12/05/14 134/94    Wt Readings from Last 3 Encounters:  01/16/15 253 lb (114.76 kg)  01/15/15 254 lb (115.214 kg)  12/19/14 252 lb (114.306 kg)     Physical Exam  Constitutional: He is oriented to person, place, and time. He appears well-developed and well-nourished.  HENT:  Head: Normocephalic and atraumatic.  Right Ear: Tympanic membrane, external ear and ear canal normal. No decreased hearing is noted.  Left Ear: Tympanic membrane normal. There is tenderness. No mastoid tenderness. Decreased hearing is noted.  Mouth/Throat: No oropharyngeal exudate or posterior oropharyngeal erythema.  Excessive cerumen with impaction left ear with erythema of the canal.  Eyes: Pupils are equal, round, and reactive to light.  Neck: Normal range of motion. Neck supple.  Cardiovascular: Normal rate and regular rhythm.   No murmur heard. Pulmonary/Chest: Breath sounds normal.  Neurological: He is alert and oriented to person, place, and time.  Vitals reviewed.   Lab Results  Component Value Date   HGBA1C 12.9 01/16/2015   HGBA1C 12.6 12/05/2014   HGBA1C 13.8 09/04/2014    Lab Results  Component Value Date   WBC 8.0 04/16/2014   HGB 16.3 04/16/2014   HCT 48.0 04/16/2014   PLT 197 04/16/2014   GLUCOSE 142* 01/16/2015   CHOL 294* 01/16/2015   TRIG 381* 01/16/2015   HDL 35* 01/16/2015   LDLCALC 183* 01/16/2015   ALT 23 01/16/2015   AST 22 01/16/2015   NA 133* 01/16/2015   K 3.9 01/16/2015   CL 91* 01/16/2015   CREATININE 0.91 01/16/2015   BUN 14 01/16/2015   CO2 25 01/16/2015   TSH 1.300 01/16/2015    PSA 0.1 03/18/2014   INR 0.99 03/26/2010   HGBA1C 12.9 01/16/2015    No results found.  Assessment & Plan:   Charles Pennington was seen today for otalgia.  Diagnoses and all orders for this visit:  Otitis, externa, infective, left  Cerumen impaction, left  Essential hypertension  Postlaminectomy syndrome, lumbar region  Other orders -     Discontinue: carbamide peroxide (DEBROX) 6.5 % otic solution; Place 5 drops into the left ear daily. apply the drops while laying on your side ear positioned upward for 15 minutes. X 10 days -     ofloxacin (FLOXIN) 0.3 % otic solution; Place 5 drops into both ears daily. In affected ear(s) lay with ear up for 5 min after applying drops   I am having Mr.  Pennington start on ofloxacin. I am also having him maintain his BLOOD GLUCOSE TEST STRIPS, amLODipine, atorvastatin, Insulin Glargine, omeprazole, PARoxetine, levothyroxine, zolpidem, fenofibrate, aspirin EC, lisinopril, Dulaglutide, LANTUS SOLOSTAR, and Insulin Pen Needle.  Meds ordered this encounter  Medications  . DISCONTD: carbamide peroxide (DEBROX) 6.5 % otic solution    Sig: Place 5 drops into the left ear daily. apply the drops while laying on your side ear positioned upward for 15 minutes. X 10 days    Dispense:  15 mL    Refill:  0  . ofloxacin (FLOXIN) 0.3 % otic solution    Sig: Place 5 drops into both ears daily. In affected ear(s) lay with ear up for 5 min after applying drops    Dispense:  5 mL    Refill:  0   PT. Advised to follow up with Ms. [name] for opiate eval & possible refill.   Follow-up: No Follow-up on file.  Mechele ClaudeWarren Terry Abila, M.D.

## 2015-01-16 ENCOUNTER — Other Ambulatory Visit: Payer: Self-pay | Admitting: Family

## 2015-01-16 ENCOUNTER — Encounter: Payer: Self-pay | Admitting: Family

## 2015-01-16 ENCOUNTER — Ambulatory Visit (INDEPENDENT_AMBULATORY_CARE_PROVIDER_SITE_OTHER): Payer: Medicare Other | Admitting: Family

## 2015-01-16 VITALS — BP 133/94 | HR 99 | Temp 97.6°F | Ht 67.0 in | Wt 253.0 lb

## 2015-01-16 DIAGNOSIS — I6789 Other cerebrovascular disease: Secondary | ICD-10-CM

## 2015-01-16 DIAGNOSIS — F329 Major depressive disorder, single episode, unspecified: Secondary | ICD-10-CM

## 2015-01-16 DIAGNOSIS — I10 Essential (primary) hypertension: Secondary | ICD-10-CM

## 2015-01-16 DIAGNOSIS — E785 Hyperlipidemia, unspecified: Secondary | ICD-10-CM

## 2015-01-16 DIAGNOSIS — F17200 Nicotine dependence, unspecified, uncomplicated: Secondary | ICD-10-CM | POA: Diagnosis not present

## 2015-01-16 DIAGNOSIS — E119 Type 2 diabetes mellitus without complications: Secondary | ICD-10-CM

## 2015-01-16 DIAGNOSIS — J4520 Mild intermittent asthma, uncomplicated: Secondary | ICD-10-CM | POA: Diagnosis not present

## 2015-01-16 DIAGNOSIS — K219 Gastro-esophageal reflux disease without esophagitis: Secondary | ICD-10-CM | POA: Diagnosis not present

## 2015-01-16 DIAGNOSIS — Z794 Long term (current) use of insulin: Secondary | ICD-10-CM | POA: Diagnosis not present

## 2015-01-16 DIAGNOSIS — M549 Dorsalgia, unspecified: Secondary | ICD-10-CM | POA: Diagnosis not present

## 2015-01-16 DIAGNOSIS — E039 Hypothyroidism, unspecified: Secondary | ICD-10-CM

## 2015-01-16 DIAGNOSIS — H16002 Unspecified corneal ulcer, left eye: Secondary | ICD-10-CM

## 2015-01-16 DIAGNOSIS — I6350 Cerebral infarction due to unspecified occlusion or stenosis of unspecified cerebral artery: Secondary | ICD-10-CM

## 2015-01-16 DIAGNOSIS — F32A Depression, unspecified: Secondary | ICD-10-CM

## 2015-01-16 DIAGNOSIS — E1142 Type 2 diabetes mellitus with diabetic polyneuropathy: Secondary | ICD-10-CM | POA: Diagnosis not present

## 2015-01-16 DIAGNOSIS — F411 Generalized anxiety disorder: Secondary | ICD-10-CM | POA: Diagnosis not present

## 2015-01-16 DIAGNOSIS — T2612XD Burn of cornea and conjunctival sac, left eye, subsequent encounter: Secondary | ICD-10-CM

## 2015-01-16 DIAGNOSIS — G8929 Other chronic pain: Secondary | ICD-10-CM

## 2015-01-16 LAB — POCT GLYCOSYLATED HEMOGLOBIN (HGB A1C): HEMOGLOBIN A1C: 12.9

## 2015-01-16 LAB — GLUCOSE, POCT (MANUAL RESULT ENTRY): POC Glucose: 120 mg/dl — AB (ref 70–99)

## 2015-01-16 MED ORDER — ALBUTEROL SULFATE HFA 108 (90 BASE) MCG/ACT IN AERS: INHALATION_SPRAY | RESPIRATORY_TRACT | Status: DC

## 2015-01-16 MED ORDER — ALPRAZOLAM 0.5 MG PO TABS: 0.5000 mg | ORAL_TABLET | Freq: Three times a day (TID) | ORAL | Status: DC

## 2015-01-16 MED ORDER — CANAGLIFLOZIN 300 MG PO TABS: 300.0000 mg | ORAL_TABLET | Freq: Every day | ORAL | Status: DC

## 2015-01-16 MED ORDER — HYDROCODONE-ACETAMINOPHEN 5-325 MG PO TABS: 1.0000 | ORAL_TABLET | Freq: Four times a day (QID) | ORAL | Status: DC | PRN

## 2015-01-16 MED ORDER — FLUTICASONE-SALMETEROL 100-50 MCG/DOSE IN AEPB: 1.0000 | INHALATION_SPRAY | Freq: Two times a day (BID) | RESPIRATORY_TRACT | Status: DC

## 2015-01-16 NOTE — Addendum Note (Signed)
Addended by: Prescott GumLAND, Latiana Tomei M on: 01/16/2015 03:16 PM   Modules accepted: Orders

## 2015-01-16 NOTE — Progress Notes (Signed)
Subjective:    Patient ID: Charles Pennington, male    DOB: 17-Jan-1966, 49 y.o.   MRN: [id number]  Pt presents to the office today for chronic follow up. Pt states he had a "fallen out" with his mother. Pt states he has not taken a lot of his medications the last day or so because they are at his mother's house.  Diabetes He presents for his follow-up diabetic visit. He has type 2 diabetes mellitus. His disease course has been fluctuating. Hypoglycemia symptoms include nervousness/anxiousness. Pertinent negatives for hypoglycemia include no confusion, dizziness, headaches or sleepiness. Associated symptoms include visual change. Pertinent negatives for diabetes include no blurred vision, no fatigue, no foot paresthesias and no foot ulcerations. There are no hypoglycemic complications. Symptoms are stable. Diabetic complications include a CVA and peripheral neuropathy. Pertinent negatives for diabetic complications include no heart disease or nephropathy. Risk factors for coronary artery disease include diabetes mellitus, dyslipidemia, hypertension, male sex and tobacco exposure. Current diabetic treatment includes insulin injections and oral agent (monotherapy). He is compliant with treatment all of the time. His weight is stable. He is following a generally healthy diet. He participates in exercise three times a week. His breakfast blood glucose range is generally 180-200 mg/dl. Eye exam is current.  Hypertension This is a chronic problem. The current episode started more than 1 year ago. The problem has been waxing and waning since onset. The problem is uncontrolled. Associated symptoms include anxiety. Pertinent negatives include no blurred vision, headaches, malaise/fatigue, palpitations, peripheral edema or shortness of breath. Risk factors for coronary artery disease include diabetes mellitus, dyslipidemia, male gender, obesity and smoking/tobacco exposure. Past treatments include calcium channel  blockers. The current treatment provides mild improvement. Hypertensive end-organ damage includes CVA and a thyroid problem. There is no history of kidney disease, CAD/MI or heart failure. There is no history of sleep apnea.  Hyperlipidemia This is a chronic problem. The current episode started more than 1 year ago. The problem is uncontrolled. Recent lipid tests were reviewed and are high. Exacerbating diseases include diabetes and hypothyroidism. Factors aggravating his hyperlipidemia include smoking. Pertinent negatives include no leg pain, myalgias or shortness of breath. Current antihyperlipidemic treatment includes statins. The current treatment provides significant improvement of lipids. Risk factors for coronary artery disease include diabetes mellitus, dyslipidemia, hypertension, male sex and obesity.  Thyroid Problem Presents for follow-up visit. Symptoms include anxiety, depressed mood and visual change. Patient reports no constipation, diarrhea, dry skin, fatigue, leg swelling, nail problem or palpitations. The symptoms have been stable. Past treatments include levothyroxine. The treatment provided significant relief. His past medical history is significant for diabetes and hyperlipidemia. There is no history of heart failure.  Anxiety Presents for follow-up visit. Symptoms include depressed mood and nervous/anxious behavior. Patient reports no confusion, dizziness, insomnia, nausea, palpitations or shortness of breath. Symptoms occur rarely. The quality of sleep is good. Nighttime awakenings: none.   His past medical history is significant for anxiety/panic attacks and depression. There is no history of arrhythmia, CAD, CHF or fibromyalgia. Past treatments include SSRIs and benzodiazephines.  Gastroesophageal Reflux He reports no belching, no coughing, no heartburn, no nausea, no sore throat or no tooth decay. This is a chronic problem. The current episode started more than 1 year ago. The  problem occurs rarely. The problem has been resolved. Pertinent negatives include no fatigue. Risk factors include caffeine use. He has tried a PPI for the symptoms. The treatment provided significant relief.      Review  of Systems  Constitutional: Negative.  Negative for malaise/fatigue and fatigue.  HENT: Negative.  Negative for sore throat.   Eyes: Negative for blurred vision.  Respiratory: Negative.  Negative for cough and shortness of breath.   Cardiovascular: Negative.  Negative for palpitations.  Gastrointestinal: Negative.  Negative for heartburn, nausea, diarrhea and constipation.  Endocrine: Negative.   Genitourinary: Negative.   Musculoskeletal: Negative.  Negative for myalgias.  Neurological: Negative.  Negative for dizziness and headaches.  Hematological: Negative.   Psychiatric/Behavioral: Negative for confusion. The patient is nervous/anxious. The patient does not have insomnia.   All other systems reviewed and are negative.      Objective:   Physical Exam  Constitutional: He is oriented to person, place, and time. He appears well-developed and well-nourished. No distress.  HENT:  Head: Normocephalic.  Right Ear: External ear normal.  Left Ear: External ear normal.  Nose: Nose normal.  Mouth/Throat: Oropharynx is clear and moist.  Eyes: Pupils are equal, round, and reactive to light. Right eye exhibits no discharge. Left eye exhibits no discharge.  Neck: Normal range of motion. Neck supple. No thyromegaly present.  Cardiovascular: Normal rate, regular rhythm, normal heart sounds and intact distal pulses.   No murmur heard. Pulmonary/Chest: Effort normal. No respiratory distress. He has wheezes.  Abdominal: Soft. Bowel sounds are normal. He exhibits no distension. There is no tenderness.  Musculoskeletal: Normal range of motion. He exhibits no edema or tenderness.  Neurological: He is alert and oriented to person, place, and time. He has normal reflexes. No cranial  nerve deficit.  Skin: Skin is warm and dry. No rash noted. No erythema.  Psychiatric: He has a normal mood and affect. His behavior is normal. Judgment and thought content normal.  Vitals reviewed.   BP 133/94 mmHg  Pulse 99  Temp(Src) 97.6 F (36.4 C) (Oral)  Ht $R'5\' 7"'Oy$  (1.702 m)  Wt 253 lb (114.76 kg)  BMI 39.62 kg/m2       Assessment & Plan:  1. C V A / STROKE - CMP14+EGFR  2. Essential hypertension - CMP14+EGFR  3. Asthma, mild intermittent, uncomplicated - LYY50+PTWS - Fluticasone-Salmeterol (ADVAIR DISKUS) 100-50 MCG/DOSE AEPB; Inhale 1 puff into the lungs 2 (two) times daily.  Dispense: 60 each; Refill: 3 - albuterol (PROAIR HFA) 108 (90 BASE) MCG/ACT inhaler; INHALE 2 PUFFS INTO THE LUNGS EVERY 6 (SIX) HOURS AS NEEDED FOR WHEEZING.  Dispense: 8.5 Inhaler; Refill: 2  4. Gastroesophageal reflux disease, esophagitis presence not specified - CMP14+EGFR  5. Hypothyroidism, unspecified hypothyroidism type - CMP14+EGFR - Thyroid Panel With TSH  6. Diabetic polyneuropathy associated with type 2 diabetes mellitus (HCC) - POCT glycosylated hemoglobin (Hb A1C) - CMP14+EGFR  7. Type 2 diabetes mellitus treated with insulin (HCC) - POCT glycosylated hemoglobin (Hb A1C) - CMP14+EGFR  8. Depression - CMP14+EGFR  9. GAD (generalized anxiety disorder) - CMP14+EGFR - ALPRAZolam (XANAX) 0.5 MG tablet; Take 1 tablet (0.5 mg total) by mouth 3 (three) times daily.  Dispense: 90 tablet; Refill: 2  10. Hyperlipidemia - CMP14+EGFR - Lipid panel  11. SMOKER - CMP14+EGFR    14. Chronic back pain - HYDROcodone-acetaminophen (NORCO/VICODIN) 5-325 MG tablet; Take 1 tablet by mouth every 6 (six) hours as needed.  Dispense: 45 tablet; Refill: 0  *Spent a great amount of time discussing importance of taking medication every day!! I reviewed patient's medications and went over each one.  Continue all meds Labs pending Health Maintenance reviewed Diet and exercise  encouraged RTO 1 month  Eleyna Brugh  Lenna Gilford, [place]

## 2015-01-16 NOTE — Patient Instructions (Signed)

## 2015-01-17 LAB — CMP14+EGFR
A/G RATIO: 1.7 (ref 1.1–2.5)
ALT: 23 IU/L (ref 0–44)
AST: 22 IU/L (ref 0–40)
Albumin: 4.5 g/dL (ref 3.5–5.5)
Alkaline Phosphatase: 96 IU/L (ref 39–117)
BILIRUBIN TOTAL: 0.2 mg/dL (ref 0.0–1.2)
BUN/Creatinine Ratio: 15 (ref 9–20)
BUN: 14 mg/dL (ref 6–24)
CO2: 25 mmol/L (ref 18–29)
Calcium: 10 mg/dL (ref 8.7–10.2)
Chloride: 91 mmol/L — ABNORMAL LOW (ref 97–106)
Creatinine, Ser: 0.91 mg/dL (ref 0.76–1.27)
GFR calc Af Amer: 115 mL/min/{1.73_m2} (ref >59)
GFR calc non Af Amer: 99 mL/min/{1.73_m2} (ref >59)
Globulin, Total: 2.6 g/dL (ref 1.5–4.5)
Glucose: 142 mg/dL — ABNORMAL HIGH (ref 65–99)
Potassium: 3.9 mmol/L (ref 3.5–5.2)
SODIUM: 133 mmol/L — AB (ref 136–144)
TOTAL PROTEIN: 7.1 g/dL (ref 6.0–8.5)

## 2015-01-17 LAB — LIPID PANEL
CHOL/HDL RATIO: 8.4 ratio — AB (ref 0.0–5.0)
CHOLESTEROL TOTAL: 294 mg/dL — AB (ref 100–199)
HDL: 35 mg/dL — ABNORMAL LOW (ref >39)
LDL CALC: 183 mg/dL — AB (ref 0–99)
TRIGLYCERIDES: 381 mg/dL — AB (ref 0–149)
VLDL CHOLESTEROL CAL: 76 mg/dL — AB (ref 5–40)

## 2015-01-17 LAB — THYROID PANEL WITH TSH
Free Thyroxine Index: 2.9 (ref 1.2–4.9)
T3 UPTAKE RATIO: 31 % (ref 24–39)
T4, Total: 9.2 ug/dL (ref 4.5–12.0)
TSH: 1.3 u[IU]/mL (ref 0.450–4.500)

## 2015-01-17 NOTE — Telephone Encounter (Signed)
Last seen 12./1/6  Charles BonitoChristy  If approved route to nurse to call into [facility]

## 2015-01-17 NOTE — Telephone Encounter (Signed)
rx called into pharmacy

## 2015-01-17 NOTE — Progress Notes (Signed)
Patient aware. Was not taking meds. Will call back to schedule with tbe

## 2015-01-21 ENCOUNTER — Encounter: Payer: Self-pay | Admitting: *Deleted

## 2015-01-21 ENCOUNTER — Ambulatory Visit: Payer: Medicare Other | Admitting: Orthopedic Surgery

## 2015-01-30 ENCOUNTER — Other Ambulatory Visit: Payer: Self-pay | Admitting: Family

## 2015-01-30 ENCOUNTER — Telehealth: Payer: Self-pay | Admitting: Family

## 2015-01-30 DIAGNOSIS — H1851 Endothelial corneal dystrophy: Secondary | ICD-10-CM | POA: Diagnosis not present

## 2015-01-30 DIAGNOSIS — H20042 Secondary noninfectious iridocyclitis, left eye: Secondary | ICD-10-CM | POA: Diagnosis not present

## 2015-01-30 NOTE — Telephone Encounter (Signed)
Patient aware.

## 2015-01-30 NOTE — Telephone Encounter (Signed)
This was refilled on 01/16/15. Can not refill until 02/16/15

## 2015-01-31 NOTE — Telephone Encounter (Signed)
Advised script should last for a month. Refill due at first of next month.

## 2015-02-04 ENCOUNTER — Encounter: Payer: Self-pay | Admitting: Family

## 2015-02-04 ENCOUNTER — Ambulatory Visit (INDEPENDENT_AMBULATORY_CARE_PROVIDER_SITE_OTHER): Payer: Medicare Other | Admitting: Family

## 2015-02-04 VITALS — BP 125/84 | HR 109 | Temp 97.4°F | Ht 67.0 in | Wt 251.0 lb

## 2015-02-04 DIAGNOSIS — G8929 Other chronic pain: Secondary | ICD-10-CM

## 2015-02-04 DIAGNOSIS — M549 Dorsalgia, unspecified: Secondary | ICD-10-CM

## 2015-02-04 DIAGNOSIS — R231 Pallor: Secondary | ICD-10-CM

## 2015-02-04 DIAGNOSIS — M5441 Lumbago with sciatica, right side: Secondary | ICD-10-CM

## 2015-02-04 DIAGNOSIS — I6350 Cerebral infarction due to unspecified occlusion or stenosis of unspecified cerebral artery: Secondary | ICD-10-CM

## 2015-02-04 MED ORDER — GABAPENTIN 300 MG PO CAPS: 300.0000 mg | ORAL_CAPSULE | Freq: Every day | ORAL | Status: DC

## 2015-02-04 MED ORDER — KETOROLAC TROMETHAMINE 60 MG/2ML IM SOLN
60.0000 mg | Freq: Once | INTRAMUSCULAR | Status: AC
  Administered 2015-02-04: 60 mg via INTRAMUSCULAR

## 2015-02-04 MED ORDER — HYDROCODONE-ACETAMINOPHEN 5-325 MG PO TABS: 1.0000 | ORAL_TABLET | Freq: Two times a day (BID) | ORAL | Status: DC | PRN

## 2015-02-04 NOTE — Patient Instructions (Signed)

## 2015-02-04 NOTE — Progress Notes (Signed)
   Subjective:    Patient ID: Charles Pennington, male    DOB: 1965/09/25, 49 y.o.   MRN: [id number]  Back Pain This is a chronic problem. The current episode started more than 1 year ago. The problem occurs constantly. The problem has been waxing and waning since onset. The pain is present in the gluteal. The pain radiates to the right thigh. The pain is at a severity of 10/10. The pain is moderate. The symptoms are aggravated by bending, lying down and twisting. Associated symptoms include leg pain and numbness. Pertinent negatives include no bladder incontinence, bowel incontinence, dysuria, perianal numbness or tingling. He has tried analgesics, bed rest and ice for the symptoms. The treatment provided mild relief.      Review of Systems  Constitutional: Negative.   HENT: Negative.   Respiratory: Negative.   Cardiovascular: Negative.   Gastrointestinal: Negative.  Negative for bowel incontinence.  Endocrine: Negative.   Genitourinary: Negative.  Negative for bladder incontinence and dysuria.  Musculoskeletal: Positive for back pain.  Neurological: Positive for numbness. Negative for tingling.  Hematological: Negative.   Psychiatric/Behavioral: Negative.   All other systems reviewed and are negative.      Objective:   Physical Exam  Constitutional: He is oriented to person, place, and time. He appears well-developed and well-nourished. No distress.  HENT:  Head: Normocephalic.  Right Ear: External ear normal.  Left Ear: External ear normal.  Mouth/Throat: Oropharynx is clear and moist.  Eyes: Pupils are equal, round, and reactive to light. Right eye exhibits no discharge. Left eye exhibits no discharge.  Neck: Normal range of motion. Neck supple. No thyromegaly present.  Cardiovascular: Normal rate, regular rhythm, normal heart sounds and intact distal pulses.   No murmur heard. Pulmonary/Chest: Effort normal and breath sounds normal. No respiratory distress. He has no  wheezes.  Abdominal: Soft. Bowel sounds are normal. He exhibits no distension. There is no tenderness.  Musculoskeletal: Normal range of motion. He exhibits no edema or tenderness.  Neurological: He is alert and oriented to person, place, and time. He has normal reflexes. No cranial nerve deficit.  Skin: Skin is warm and dry. No rash noted. No erythema.  Psychiatric: He has a normal mood and affect. His behavior is normal. Judgment and thought content normal.  Vitals reviewed.     BP 125/84 mmHg  Pulse 109  Temp(Src) 97.4 F (36.3 C) (Oral)  Ht '5\' 7"'$  (1.702 m)  Wt 251 lb (113.853 kg)  BMI 39.30 kg/m2     Assessment & Plan:  1. Chronic back pain -Rest -Ice and head as needed -30 day rx of Norco given to patient!!! - HYDROcodone-acetaminophen (NORCO/VICODIN) 5-325 MG tablet; Take 1 tablet by mouth every 12 (twelve) hours as needed.  Dispense: 60 tablet; Refill: 0 - gabapentin (NEURONTIN) 300 MG capsule; Take 1 capsule (300 mg total) by mouth at bedtime.  Dispense: 90 capsule; Refill: 1 - BMP8+EGFR  2. Right-sided low back pain with right-sided sciatica -Rest -Ice and heat - ketorolac (TORADOL) injection 60 mg; Inject 2 mLs (60 mg total) into the muscle once. - gabapentin (NEURONTIN) 300 MG capsule; Take 1 capsule (300 mg total) by mouth at bedtime.  Dispense: 90 capsule; Refill: 1 - BMP8+EGFR  3. Pale -Labs pending - CBC with Differential/Platelet - BMP8+EGFR  Evelina Dun, FNP

## 2015-02-05 LAB — CBC WITH DIFFERENTIAL/PLATELET
BASOS: 1 %
Basophils Absolute: 0.1 10*3/uL (ref 0.0–0.2)
EOS (ABSOLUTE): 0.2 10*3/uL (ref 0.0–0.4)
EOS: 2 %
HEMATOCRIT: 45.1 % (ref 37.5–51.0)
HEMOGLOBIN: 15.3 g/dL (ref 12.6–17.7)
IMMATURE GRANS (ABS): 0 10*3/uL (ref 0.0–0.1)
IMMATURE GRANULOCYTES: 0 %
LYMPHS: 22 %
Lymphocytes Absolute: 2.2 10*3/uL (ref 0.7–3.1)
MCH: 31.5 pg (ref 26.6–33.0)
MCHC: 33.9 g/dL (ref 31.5–35.7)
MCV: 93 fL (ref 79–97)
MONOCYTES: 5 %
Monocytes Absolute: 0.5 10*3/uL (ref 0.1–0.9)
NEUTROS PCT: 70 %
Neutrophils Absolute: 7.2 10*3/uL — ABNORMAL HIGH (ref 1.4–7.0)
PLATELETS: 204 10*3/uL (ref 150–379)
RBC: 4.86 x10E6/uL (ref 4.14–5.80)
RDW: 13.7 % (ref 12.3–15.4)
WBC: 10.2 10*3/uL (ref 3.4–10.8)

## 2015-02-05 LAB — BMP8+EGFR
BUN/Creatinine Ratio: 15 (ref 9–20)
BUN: 15 mg/dL (ref 6–24)
CALCIUM: 9.1 mg/dL (ref 8.7–10.2)
CHLORIDE: 91 mmol/L — AB (ref 96–106)
CO2: 26 mmol/L (ref 18–29)
Creatinine, Ser: 1 mg/dL (ref 0.76–1.27)
GFR, EST AFRICAN AMERICAN: 102 mL/min/{1.73_m2} (ref >59)
GFR, EST NON AFRICAN AMERICAN: 89 mL/min/{1.73_m2} (ref >59)
Glucose: 366 mg/dL — ABNORMAL HIGH (ref 65–99)
POTASSIUM: 4.6 mmol/L (ref 3.5–5.2)
SODIUM: 132 mmol/L — AB (ref 134–144)

## 2015-02-06 ENCOUNTER — Telehealth: Payer: Self-pay | Admitting: Family

## 2015-02-06 NOTE — Telephone Encounter (Signed)
Spoke with pt and informed of recommendation Verbalizes understanding

## 2015-02-06 NOTE — Telephone Encounter (Signed)
No

## 2015-02-10 ENCOUNTER — Other Ambulatory Visit: Payer: Self-pay | Admitting: Family

## 2015-02-21 ENCOUNTER — Encounter: Payer: Self-pay | Admitting: Family

## 2015-02-21 ENCOUNTER — Ambulatory Visit (INDEPENDENT_AMBULATORY_CARE_PROVIDER_SITE_OTHER): Payer: Medicare Other | Admitting: Family

## 2015-02-21 VITALS — BP 117/73 | HR 98 | Temp 97.1°F | Ht 67.0 in | Wt 242.0 lb

## 2015-02-21 DIAGNOSIS — F17200 Nicotine dependence, unspecified, uncomplicated: Secondary | ICD-10-CM

## 2015-02-21 DIAGNOSIS — E039 Hypothyroidism, unspecified: Secondary | ICD-10-CM | POA: Diagnosis not present

## 2015-02-21 DIAGNOSIS — I10 Essential (primary) hypertension: Secondary | ICD-10-CM

## 2015-02-21 DIAGNOSIS — F329 Major depressive disorder, single episode, unspecified: Secondary | ICD-10-CM | POA: Diagnosis not present

## 2015-02-21 DIAGNOSIS — F411 Generalized anxiety disorder: Secondary | ICD-10-CM | POA: Diagnosis not present

## 2015-02-21 DIAGNOSIS — Z794 Long term (current) use of insulin: Secondary | ICD-10-CM

## 2015-02-21 DIAGNOSIS — E785 Hyperlipidemia, unspecified: Secondary | ICD-10-CM

## 2015-02-21 DIAGNOSIS — K219 Gastro-esophageal reflux disease without esophagitis: Secondary | ICD-10-CM | POA: Diagnosis not present

## 2015-02-21 DIAGNOSIS — E119 Type 2 diabetes mellitus without complications: Secondary | ICD-10-CM

## 2015-02-21 DIAGNOSIS — F32A Depression, unspecified: Secondary | ICD-10-CM

## 2015-02-21 DIAGNOSIS — E1142 Type 2 diabetes mellitus with diabetic polyneuropathy: Secondary | ICD-10-CM | POA: Diagnosis not present

## 2015-02-21 LAB — POCT GLYCOSYLATED HEMOGLOBIN (HGB A1C): HEMOGLOBIN A1C: 9.2

## 2015-02-21 NOTE — Patient Instructions (Signed)

## 2015-02-21 NOTE — Progress Notes (Signed)
Subjective:    Patient ID: Charles Pennington, male    DOB: [birth year], 50 y.o.   MRN: [id number]  Pt presents to the office today for chronic follow up.  Diabetes He presents for his follow-up diabetic visit. He has type 2 diabetes mellitus. His disease course has been fluctuating. Pertinent negatives for hypoglycemia include no confusion, dizziness, headaches or sleepiness. Associated symptoms include visual change. Pertinent negatives for diabetes include no blurred vision, no fatigue, no foot paresthesias and no foot ulcerations. There are no hypoglycemic complications. Symptoms are stable. Diabetic complications include a CVA and peripheral neuropathy. Pertinent negatives for diabetic complications include no heart disease or nephropathy. Risk factors for coronary artery disease include diabetes mellitus, dyslipidemia, hypertension, male sex and tobacco exposure. Current diabetic treatment includes insulin injections and oral agent (monotherapy). He is compliant with treatment all of the time. His weight is stable. He is following a generally healthy diet. He participates in exercise three times a week. His breakfast blood glucose range is generally 140-180 mg/dl. Eye exam is current.  Hypertension This is a chronic problem. The current episode started more than 1 year ago. The problem has been resolved since onset. The problem is controlled. Associated symptoms include anxiety. Pertinent negatives include no blurred vision, headaches, malaise/fatigue, palpitations, peripheral edema or shortness of breath. Risk factors for coronary artery disease include diabetes mellitus, dyslipidemia, male gender, obesity and smoking/tobacco exposure. Past treatments include calcium channel blockers. The current treatment provides mild improvement. Hypertensive end-organ damage includes CVA and a thyroid problem. There is no history of kidney disease, CAD/MI or heart failure. There is no history of sleep apnea.    Hyperlipidemia This is a chronic problem. The current episode started more than 1 year ago. The problem is uncontrolled. Recent lipid tests were reviewed and are high. Exacerbating diseases include diabetes and hypothyroidism. Factors aggravating his hyperlipidemia include smoking. Pertinent negatives include no leg pain, myalgias or shortness of breath. Current antihyperlipidemic treatment includes statins. The current treatment provides significant improvement of lipids. Risk factors for coronary artery disease include diabetes mellitus, dyslipidemia, hypertension, male sex and obesity.  Thyroid Problem Presents for follow-up visit. Symptoms include depressed mood and visual change. Patient reports no constipation, diarrhea, dry skin, fatigue, leg swelling, nail problem or palpitations. The symptoms have been stable. Past treatments include levothyroxine. The treatment provided significant relief. His past medical history is significant for diabetes and hyperlipidemia. There is no history of heart failure.  Anxiety Presents for follow-up visit. Symptoms include depressed mood. Patient reports no confusion, dizziness, insomnia, nausea, palpitations or shortness of breath. Symptoms occur rarely. The quality of sleep is good. Nighttime awakenings: none.   His past medical history is significant for anxiety/panic attacks and depression. There is no history of arrhythmia, CAD, CHF or fibromyalgia. Past treatments include SSRIs and benzodiazephines.  Gastroesophageal Reflux He reports no belching, no coughing, no heartburn, no nausea, no sore throat or no tooth decay. This is a chronic problem. The current episode started more than 1 year ago. The problem occurs rarely. The problem has been resolved. Pertinent negatives include no fatigue. Risk factors include caffeine use. He has tried a PPI for the symptoms. The treatment provided significant relief.      Review of Systems  Constitutional: Negative.   Negative for malaise/fatigue and fatigue.  HENT: Negative.  Negative for sore throat.   Eyes: Negative for blurred vision.  Respiratory: Negative.  Negative for cough and shortness of breath.   Cardiovascular: Negative.  Negative for palpitations.  Gastrointestinal: Negative.  Negative for heartburn, nausea, diarrhea and constipation.  Endocrine: Negative.   Genitourinary: Negative.   Musculoskeletal: Negative.  Negative for myalgias.  Neurological: Negative.  Negative for dizziness and headaches.  Hematological: Negative.   Psychiatric/Behavioral: Negative.  Negative for confusion. The patient does not have insomnia.   All other systems reviewed and are negative.      Objective:   Physical Exam  Constitutional: He is oriented to person, place, and time. He appears well-developed and well-nourished. No distress.  HENT:  Head: Normocephalic.  Right Ear: External ear normal.  Left Ear: External ear normal.  Nose: Nose normal.  Mouth/Throat: Oropharynx is clear and moist.  Eyes: Pupils are equal, round, and reactive to light. Right eye exhibits no discharge. Left eye exhibits no discharge.  Neck: Normal range of motion. Neck supple. No thyromegaly present.  Cardiovascular: Normal rate, regular rhythm, normal heart sounds and intact distal pulses.   No murmur heard. Pulmonary/Chest: Effort normal and breath sounds normal. No respiratory distress. He has no wheezes.  Abdominal: Soft. Bowel sounds are normal. He exhibits no distension. There is no tenderness.  Musculoskeletal: Normal range of motion. He exhibits no edema or tenderness.  Neurological: He is alert and oriented to person, place, and time. He has normal reflexes. No cranial nerve deficit.  Skin: Skin is warm and dry. No rash noted. No erythema.  Psychiatric: He has a normal mood and affect. His behavior is normal. Judgment and thought content normal.  Vitals reviewed.     BP 117/73 mmHg  Pulse 98  Temp(Src) 97.1 F  (36.2 C) (Oral)  Ht 5' 7"  (1.702 m)  Wt 242 lb (109.77 kg)  BMI 37.89 kg/m2     Assessment & Plan:  1. Essential hypertension - CMP14+EGFR  2. Gastroesophageal reflux disease, esophagitis presence not specified - CMP14+EGFR  3. Hypothyroidism, unspecified hypothyroidism type - CMP14+EGFR  4. Type 2 diabetes mellitus treated with insulin (HCC) - POCT glycosylated hemoglobin (Hb A1C) - CMP14+EGFR  5. Diabetic polyneuropathy associated with type 2 diabetes mellitus (HCC) - CMP14+EGFR  6. SMOKER - CMP14+EGFR  7. Depression - CMP14+EGFR  8. GAD (generalized anxiety disorder) - CMP14+EGFR  9. Hyperlipidemia - CMP14+EGFR - Lipid panel   Continue all meds Labs pending Health Maintenance reviewed Diet and exercise encouraged RTO 3 months  Evelina Dun, FNP

## 2015-02-22 LAB — CMP14+EGFR
A/G RATIO: 1.7 (ref 1.1–2.5)
ALK PHOS: 61 IU/L (ref 39–117)
ALT: 18 IU/L (ref 0–44)
AST: 24 IU/L (ref 0–40)
Albumin: 4.1 g/dL (ref 3.5–5.5)
BUN / CREAT RATIO: 11 (ref 9–20)
BUN: 14 mg/dL (ref 6–24)
Bilirubin Total: 0.5 mg/dL (ref 0.0–1.2)
CO2: 24 mmol/L (ref 18–29)
Calcium: 9.7 mg/dL (ref 8.7–10.2)
Chloride: 95 mmol/L — ABNORMAL LOW (ref 96–106)
Creatinine, Ser: 1.24 mg/dL (ref 0.76–1.27)
GFR calc Af Amer: 79 mL/min/{1.73_m2} (ref >59)
GFR calc non Af Amer: 68 mL/min/{1.73_m2} (ref >59)
GLOBULIN, TOTAL: 2.4 g/dL (ref 1.5–4.5)
Glucose: 273 mg/dL — ABNORMAL HIGH (ref 65–99)
POTASSIUM: 3.9 mmol/L (ref 3.5–5.2)
SODIUM: 137 mmol/L (ref 134–144)
Total Protein: 6.5 g/dL (ref 6.0–8.5)

## 2015-02-22 LAB — LIPID PANEL
CHOL/HDL RATIO: 5.1 ratio — AB (ref 0.0–5.0)
CHOLESTEROL TOTAL: 208 mg/dL — AB (ref 100–199)
HDL: 41 mg/dL (ref >39)
LDL Calculated: 140 mg/dL — ABNORMAL HIGH (ref 0–99)
TRIGLYCERIDES: 133 mg/dL (ref 0–149)
VLDL Cholesterol Cal: 27 mg/dL (ref 5–40)

## 2015-02-28 ENCOUNTER — Other Ambulatory Visit: Payer: Self-pay | Admitting: Family

## 2015-03-03 ENCOUNTER — Other Ambulatory Visit: Payer: Self-pay | Admitting: Family

## 2015-03-03 DIAGNOSIS — F411 Generalized anxiety disorder: Secondary | ICD-10-CM

## 2015-03-03 DIAGNOSIS — M549 Dorsalgia, unspecified: Principal | ICD-10-CM

## 2015-03-03 DIAGNOSIS — G8929 Other chronic pain: Secondary | ICD-10-CM

## 2015-03-03 MED ORDER — ALPRAZOLAM 0.5 MG PO TABS: 0.5000 mg | ORAL_TABLET | Freq: Three times a day (TID) | ORAL | Status: DC

## 2015-03-03 MED ORDER — HYDROCODONE-ACETAMINOPHEN 5-325 MG PO TABS: 1.0000 | ORAL_TABLET | Freq: Two times a day (BID) | ORAL | Status: DC | PRN

## 2015-03-03 NOTE — Telephone Encounter (Signed)
Patient is requesting a refill on hydrocodone.  Last filled 12/20. Please review since Charles Pennington is on vacation.

## 2015-03-03 NOTE — Telephone Encounter (Signed)
Okay to fill, will have to be seen [facility]

## 2015-03-03 NOTE — Telephone Encounter (Signed)
Patient aware he will have to be seenfor any further refills and rx is ready to be picked.

## 2015-03-04 DIAGNOSIS — H02831 Dermatochalasis of right upper eyelid: Secondary | ICD-10-CM | POA: Diagnosis not present

## 2015-03-04 DIAGNOSIS — Z7951 Long term (current) use of inhaled steroids: Secondary | ICD-10-CM | POA: Diagnosis not present

## 2015-03-04 DIAGNOSIS — F17200 Nicotine dependence, unspecified, uncomplicated: Secondary | ICD-10-CM | POA: Diagnosis not present

## 2015-03-04 DIAGNOSIS — H2513 Age-related nuclear cataract, bilateral: Secondary | ICD-10-CM | POA: Diagnosis not present

## 2015-03-04 DIAGNOSIS — H17821 Peripheral opacity of cornea, right eye: Secondary | ICD-10-CM | POA: Diagnosis not present

## 2015-03-04 DIAGNOSIS — Z794 Long term (current) use of insulin: Secondary | ICD-10-CM | POA: Diagnosis not present

## 2015-03-04 DIAGNOSIS — E119 Type 2 diabetes mellitus without complications: Secondary | ICD-10-CM | POA: Diagnosis not present

## 2015-03-04 DIAGNOSIS — H02834 Dermatochalasis of left upper eyelid: Secondary | ICD-10-CM | POA: Diagnosis not present

## 2015-03-04 DIAGNOSIS — I10 Essential (primary) hypertension: Secondary | ICD-10-CM | POA: Diagnosis not present

## 2015-03-04 DIAGNOSIS — Z7982 Long term (current) use of aspirin: Secondary | ICD-10-CM | POA: Diagnosis not present

## 2015-03-04 DIAGNOSIS — Z79899 Other long term (current) drug therapy: Secondary | ICD-10-CM | POA: Diagnosis not present

## 2015-03-04 DIAGNOSIS — Z885 Allergy status to narcotic agent status: Secondary | ICD-10-CM | POA: Diagnosis not present

## 2015-03-04 DIAGNOSIS — H1812 Bullous keratopathy, left eye: Secondary | ICD-10-CM | POA: Diagnosis not present

## 2015-03-04 DIAGNOSIS — H1851 Endothelial corneal dystrophy: Secondary | ICD-10-CM | POA: Diagnosis not present

## 2015-03-04 DIAGNOSIS — H179 Unspecified corneal scar and opacity: Secondary | ICD-10-CM | POA: Diagnosis not present

## 2015-03-12 ENCOUNTER — Encounter: Payer: Self-pay | Admitting: *Deleted

## 2015-03-14 ENCOUNTER — Ambulatory Visit: Payer: Medicare Other | Admitting: Family

## 2015-03-17 ENCOUNTER — Encounter: Payer: Self-pay | Admitting: Family

## 2015-03-17 ENCOUNTER — Ambulatory Visit (INDEPENDENT_AMBULATORY_CARE_PROVIDER_SITE_OTHER): Payer: Medicare Other | Admitting: Family

## 2015-03-17 VITALS — BP 131/81 | HR 103 | Temp 97.7°F | Ht 67.0 in | Wt 241.0 lb

## 2015-03-17 DIAGNOSIS — F17200 Nicotine dependence, unspecified, uncomplicated: Secondary | ICD-10-CM | POA: Diagnosis not present

## 2015-03-17 DIAGNOSIS — Z794 Long term (current) use of insulin: Secondary | ICD-10-CM

## 2015-03-17 DIAGNOSIS — J4520 Mild intermittent asthma, uncomplicated: Secondary | ICD-10-CM | POA: Diagnosis not present

## 2015-03-17 DIAGNOSIS — J449 Chronic obstructive pulmonary disease, unspecified: Secondary | ICD-10-CM | POA: Diagnosis not present

## 2015-03-17 DIAGNOSIS — E119 Type 2 diabetes mellitus without complications: Secondary | ICD-10-CM | POA: Diagnosis not present

## 2015-03-17 DIAGNOSIS — M5441 Lumbago with sciatica, right side: Secondary | ICD-10-CM | POA: Diagnosis not present

## 2015-03-17 MED ORDER — KETOROLAC TROMETHAMINE 60 MG/2ML IM SOLN
60.0000 mg | Freq: Once | INTRAMUSCULAR | Status: AC
  Administered 2015-03-17: 60 mg via INTRAMUSCULAR

## 2015-03-17 MED ORDER — METHYLPREDNISOLONE ACETATE 80 MG/ML IJ SUSP
40.0000 mg | Freq: Once | INTRAMUSCULAR | Status: AC
  Administered 2015-03-17: 40 mg via INTRAMUSCULAR

## 2015-03-17 MED ORDER — TIOTROPIUM BROMIDE MONOHYDRATE 18 MCG IN CAPS: 18.0000 ug | ORAL_CAPSULE | Freq: Every day | RESPIRATORY_TRACT | Status: DC

## 2015-03-17 MED ORDER — DULAGLUTIDE 0.75 MG/0.5ML ~~LOC~~ SOAJ: SUBCUTANEOUS | Status: DC

## 2015-03-17 NOTE — Patient Instructions (Signed)
Sciatica With Rehab The sciatic nerve runs from the back down the leg and is responsible for sensation and control of the muscles in the back (posterior) side of the thigh, lower leg, and foot. Sciatica is a condition that is characterized by inflammation of this nerve.  SYMPTOMS   Signs of nerve damage, including numbness and/or weakness along the posterior side of the lower extremity.  Pain in the back of the thigh that may also travel down the leg.  Pain that worsens when sitting for long periods of time.  Occasionally, pain in the back or buttock. CAUSES  Inflammation of the sciatic nerve is the cause of sciatica. The inflammation is due to something irritating the nerve. Common sources of irritation include:  Sitting for long periods of time.  Direct trauma to the nerve.  Arthritis of the spine.  Herniated or ruptured disk.  Slipping of the vertebrae (spondylolisthesis).  Pressure from soft tissues, such as muscles or ligament-like tissue (fascia). RISK INCREASES WITH:  Sports that place pressure or stress on the spine (football or weightlifting).  Poor strength and flexibility.  Failure to warm up properly before activity.  Family history of low back pain or disk disorders.  Previous back injury or surgery.  Poor body mechanics, especially when lifting, or poor posture. PREVENTION   Warm up and stretch properly before activity.  Maintain physical fitness:  Strength, flexibility, and endurance.  Cardiovascular fitness.  Learn and use proper technique, especially with posture and lifting. When possible, have coach correct improper technique.  Avoid activities that place stress on the spine. PROGNOSIS If treated properly, then sciatica usually resolves within 6 weeks. However, occasionally surgery is necessary.  RELATED COMPLICATIONS   Permanent nerve damage, including pain, numbness, tingle, or weakness.  Chronic back pain.  Risks of surgery: infection,  bleeding, nerve damage, or damage to surrounding tissues. TREATMENT Treatment initially involves resting from any activities that aggravate your symptoms. The use of ice and medication may help reduce pain and inflammation. The use of strengthening and stretching exercises may help reduce pain with activity. These exercises may be performed at home or with referral to a therapist. A therapist may recommend further treatments, such as transcutaneous electronic nerve stimulation (TENS) or ultrasound. Your caregiver may recommend corticosteroid injections to help reduce inflammation of the sciatic nerve. If symptoms persist despite non-surgical (conservative) treatment, then surgery may be recommended. MEDICATION  If pain medication is necessary, then nonsteroidal anti-inflammatory medications, such as aspirin and ibuprofen, or other minor pain relievers, such as acetaminophen, are often recommended.  Do not take pain medication for 7 days before surgery.  Prescription pain relievers may be given if deemed necessary by your caregiver. Use only as directed and only as much as you need.  Ointments applied to the skin may be helpful.  Corticosteroid injections may be given by your caregiver. These injections should be reserved for the most serious cases, because they may only be given a certain number of times. HEAT AND COLD  Cold treatment (icing) relieves pain and reduces inflammation. Cold treatment should be applied for 10 to 15 minutes every 2 to 3 hours for inflammation and pain and immediately after any activity that aggravates your symptoms. Use ice packs or massage the area with a piece of ice (ice massage).  Heat treatment may be used prior to performing the stretching and strengthening activities prescribed by your caregiver, physical therapist, or athletic trainer. Use a heat pack or soak the injury in warm water.   SEEK MEDICAL CARE IF:  Treatment seems to offer no benefit, or the condition  worsens.  Any medications produce adverse side effects. EXERCISES  RANGE OF MOTION (ROM) AND STRETCHING EXERCISES - Sciatica Most people with sciatic will find that their symptoms worsen with either excessive bending forward (flexion) or arching at the low back (extension). The exercises which will help resolve your symptoms will focus on the opposite motion. Your physician, physical therapist or athletic trainer will help you determine which exercises will be most helpful to resolve your low back pain. Do not complete any exercises without first consulting with your clinician. Discontinue any exercises which worsen your symptoms until you speak to your clinician. If you have pain, numbness or tingling which travels down into your buttocks, leg or foot, the goal of the therapy is for these symptoms to move closer to your back and eventually resolve. Occasionally, these leg symptoms will get better, but your low back pain may worsen; this is typically an indication of progress in your rehabilitation. Be certain to be very alert to any changes in your symptoms and the activities in which you participated in the 24 hours prior to the change. Sharing this information with your clinician will allow him/her to most efficiently treat your condition. These exercises may help you when beginning to rehabilitate your injury. Your symptoms may resolve with or without further involvement from your physician, physical therapist or athletic trainer. While completing these exercises, remember:   Restoring tissue flexibility helps normal motion to return to the joints. This allows healthier, less painful movement and activity.  An effective stretch should be held for at least 30 seconds.  A stretch should never be painful. You should only feel a gentle lengthening or release in the stretched tissue. FLEXION RANGE OF MOTION AND STRETCHING EXERCISES: STRETCH - Flexion, Single Knee to Chest   Lie on a firm bed or floor  with both legs extended in front of you.  Keeping one leg in contact with the floor, bring your opposite knee to your chest. Hold your leg in place by either grabbing behind your thigh or at your knee.  Pull until you feel a gentle stretch in your low back. Hold __________ seconds.  Slowly release your grasp and repeat the exercise with the opposite side. Repeat __________ times. Complete this exercise __________ times per day.  STRETCH - Flexion, Double Knee to Chest  Lie on a firm bed or floor with both legs extended in front of you.  Keeping one leg in contact with the floor, bring your opposite knee to your chest.  Tense your stomach muscles to support your back and then lift your other knee to your chest. Hold your legs in place by either grabbing behind your thighs or at your knees.  Pull both knees toward your chest until you feel a gentle stretch in your low back. Hold __________ seconds.  Tense your stomach muscles and slowly return one leg at a time to the floor. Repeat __________ times. Complete this exercise __________ times per day.  STRETCH - Low Trunk Rotation   Lie on a firm bed or floor. Keeping your legs in front of you, bend your knees so they are both pointed toward the ceiling and your feet are flat on the floor.  Extend your arms out to the side. This will stabilize your upper body by keeping your shoulders in contact with the floor.  Gently and slowly drop both knees together to one side until   you feel a gentle stretch in your low back. Hold for __________ seconds.  Tense your stomach muscles to support your low back as you bring your knees back to the starting position. Repeat the exercise to the other side. Repeat __________ times. Complete this exercise __________ times per day  EXTENSION RANGE OF MOTION AND FLEXIBILITY EXERCISES: STRETCH - Extension, Prone on Elbows  Lie on your stomach on the floor, a bed will be too soft. Place your palms about shoulder  width apart and at the height of your head.  Place your elbows under your shoulders. If this is too painful, stack pillows under your chest.  Allow your body to relax so that your hips drop lower and make contact more completely with the floor.  Hold this position for __________ seconds.  Slowly return to lying flat on the floor. Repeat __________ times. Complete this exercise __________ times per day.  RANGE OF MOTION - Extension, Prone Press Ups  Lie on your stomach on the floor, a bed will be too soft. Place your palms about shoulder width apart and at the height of your head.  Keeping your back as relaxed as possible, slowly straighten your elbows while keeping your hips on the floor. You may adjust the placement of your hands to maximize your comfort. As you gain motion, your hands will come more underneath your shoulders.  Hold this position __________ seconds.  Slowly return to lying flat on the floor. Repeat __________ times. Complete this exercise __________ times per day.  STRENGTHENING EXERCISES - Sciatica  These exercises may help you when beginning to rehabilitate your injury. These exercises should be done near your "sweet spot." This is the neutral, low-back arch, somewhere between fully rounded and fully arched, that is your least painful position. When performed in this safe range of motion, these exercises can be used for people who have either a flexion or extension based injury. These exercises may resolve your symptoms with or without further involvement from your physician, physical therapist or athletic trainer. While completing these exercises, remember:   Muscles can gain both the endurance and the strength needed for everyday activities through controlled exercises.  Complete these exercises as instructed by your physician, physical therapist or athletic trainer. Progress with the resistance and repetition exercises only as your caregiver advises.  You may  experience muscle soreness or fatigue, but the pain or discomfort you are trying to eliminate should never worsen during these exercises. If this pain does worsen, stop and make certain you are following the directions exactly. If the pain is still present after adjustments, discontinue the exercise until you can discuss the trouble with your clinician. STRENGTHENING - Deep Abdominals, Pelvic Tilt   Lie on a firm bed or floor. Keeping your legs in front of you, bend your knees so they are both pointed toward the ceiling and your feet are flat on the floor.  Tense your lower abdominal muscles to press your low back into the floor. This motion will rotate your pelvis so that your tail bone is scooping upwards rather than pointing at your feet or into the floor.  With a gentle tension and even breathing, hold this position for __________ seconds. Repeat __________ times. Complete this exercise __________ times per day.  STRENGTHENING - Abdominals, Crunches   Lie on a firm bed or floor. Keeping your legs in front of you, bend your knees so they are both pointed toward the ceiling and your feet are flat on the   floor. Cross your arms over your chest.  Slightly tip your chin down without bending your neck.  Tense your abdominals and slowly lift your trunk high enough to just clear your shoulder blades. Lifting higher can put excessive stress on the low back and does not further strengthen your abdominal muscles.  Control your return to the starting position. Repeat __________ times. Complete this exercise __________ times per day.  STRENGTHENING - Quadruped, Opposite UE/LE Lift  Assume a hands and knees position on a firm surface. Keep your hands under your shoulders and your knees under your hips. You may place padding under your knees for comfort.  Find your neutral spine and gently tense your abdominal muscles so that you can maintain this position. Your shoulders and hips should form a rectangle  that is parallel with the floor and is not twisted.  Keeping your trunk steady, lift your right hand no higher than your shoulder and then your left leg no higher than your hip. Make sure you are not holding your breath. Hold this position __________ seconds.  Continuing to keep your abdominal muscles tense and your back steady, slowly return to your starting position. Repeat with the opposite arm and leg. Repeat __________ times. Complete this exercise __________ times per day.  STRENGTHENING - Abdominals and Quadriceps, Straight Leg Raise   Lie on a firm bed or floor with both legs extended in front of you.  Keeping one leg in contact with the floor, bend the other knee so that your foot can rest flat on the floor.  Find your neutral spine, and tense your abdominal muscles to maintain your spinal position throughout the exercise.  Slowly lift your straight leg off the floor about 6 inches for a count of 15, making sure to not hold your breath.  Still keeping your neutral spine, slowly lower your leg all the way to the floor. Repeat this exercise with each leg __________ times. Complete this exercise __________ times per day. POSTURE AND BODY MECHANICS CONSIDERATIONS - Sciatica Keeping correct posture when sitting, standing or completing your activities will reduce the stress put on different body tissues, allowing injured tissues a chance to heal and limiting painful experiences. The following are general guidelines for improved posture. Your physician or physical therapist will provide you with any instructions specific to your needs. While reading these guidelines, remember:  The exercises prescribed by your provider will help you have the flexibility and strength to maintain correct postures.  The correct posture provides the optimal environment for your joints to work. All of your joints have less wear and tear when properly supported by a spine with good posture. This means you will  experience a healthier, less painful body.  Correct posture must be practiced with all of your activities, especially prolonged sitting and standing. Correct posture is as important when doing repetitive low-stress activities (typing) as it is when doing a single heavy-load activity (lifting). RESTING POSITIONS Consider which positions are most painful for you when choosing a resting position. If you have pain with flexion-based activities (sitting, bending, stooping, squatting), choose a position that allows you to rest in a less flexed posture. You would want to avoid curling into a fetal position on your side. If your pain worsens with extension-based activities (prolonged standing, working overhead), avoid resting in an extended position such as sleeping on your stomach. Most people will find more comfort when they rest with their spine in a more neutral position, neither too rounded nor too   arched. Lying on a non-sagging bed on your side with a pillow between your knees, or on your back with a pillow under your knees will often provide some relief. Keep in mind, being in any one position for a prolonged period of time, no matter how correct your posture, can still lead to stiffness. PROPER SITTING POSTURE In order to minimize stress and discomfort on your spine, you must sit with correct posture Sitting with good posture should be effortless for a healthy body. Returning to good posture is a gradual process. Many people can work toward this most comfortably by using various supports until they have the flexibility and strength to maintain this posture on their own. When sitting with proper posture, your ears will fall over your shoulders and your shoulders will fall over your hips. You should use the back of the chair to support your upper back. Your low back will be in a neutral position, just slightly arched. You may place a small pillow or folded towel at the base of your low back for support.  When  working at a desk, create an environment that supports good, upright posture. Without extra support, muscles fatigue and lead to excessive strain on joints and other tissues. Keep these recommendations in mind: CHAIR:   A chair should be able to slide under your desk when your back makes contact with the back of the chair. This allows you to work closely.  The chair's height should allow your eyes to be level with the upper part of your monitor and your hands to be slightly lower than your elbows. BODY POSITION  Your feet should make contact with the floor. If this is not possible, use a foot rest.  Keep your ears over your shoulders. This will reduce stress on your neck and low back. INCORRECT SITTING POSTURES   If you are feeling tired and unable to assume a healthy sitting posture, do not slouch or slump. This puts excessive strain on your back tissues, causing more damage and pain. Healthier options include:  Using more support, like a lumbar pillow.  Switching tasks to something that requires you to be upright or walking.  Talking a brief walk.  Lying down to rest in a neutral-spine position. PROLONGED STANDING WHILE SLIGHTLY LEANING FORWARD  When completing a task that requires you to lean forward while standing in one place for a long time, place either foot up on a stationary 2-4 inch high object to help maintain the best posture. When both feet are on the ground, the low back tends to lose its slight inward curve. If this curve flattens (or becomes too large), then the back and your other joints will experience too much stress, fatigue more quickly and can cause pain.  CORRECT STANDING POSTURES Proper standing posture should be assumed with all daily activities, even if they only take a few moments, like when brushing your teeth. As in sitting, your ears should fall over your shoulders and your shoulders should fall over your hips. You should keep a slight tension in your abdominal  muscles to brace your spine. Your tailbone should point down to the ground, not behind your body, resulting in an over-extended swayback posture.  INCORRECT STANDING POSTURES  Common incorrect standing postures include a forward head, locked knees and/or an excessive swayback. WALKING Walk with an upright posture. Your ears, shoulders and hips should all line-up. PROLONGED ACTIVITY IN A FLEXED POSITION When completing a task that requires you to bend forward   at your waist or lean over a low surface, try to find a way to stabilize 3 of 4 of your limbs. You can place a hand or elbow on your thigh or rest a knee on the surface you are reaching across. This will provide you more stability so that your muscles do not fatigue as quickly. By keeping your knees relaxed, or slightly bent, you will also reduce stress across your low back. CORRECT LIFTING TECHNIQUES DO :   Assume a wide stance. This will provide you more stability and the opportunity to get as close as possible to the object which you are lifting.  Tense your abdominals to brace your spine; then bend at the knees and hips. Keeping your back locked in a neutral-spine position, lift using your leg muscles. Lift with your legs, keeping your back straight.  Test the weight of unknown objects before attempting to lift them.  Try to keep your elbows locked down at your sides in order get the best strength from your shoulders when carrying an object.  Always ask for help when lifting heavy or awkward objects. INCORRECT LIFTING TECHNIQUES DO NOT:   Lock your knees when lifting, even if it is a small object.  Bend and twist. Pivot at your feet or move your feet when needing to change directions.  Assume that you cannot safely pick up a paperclip without proper posture.   This information is not intended to replace advice given to you by your health care provider. Make sure you discuss any questions you have with your health care provider.     Document Released: 02/01/2005 Document Revised: 06/18/2014 Document Reviewed: 05/16/2008 [employer] Interactive Patient Education 2016 [employer]  

## 2015-03-17 NOTE — Progress Notes (Signed)
Subjective:    Patient ID: Charles Pennington, male    DOB: 01/05/1966, 50 y.o.   MRN: [id number]  PT presents to the office today for chronic back pain. Pt states he fell several times during the snow and has "back surgery 3-4 years ago". Pt is followed by Tomas[facility], but states he has not had transportation. PT states he is suppose to have a MRI.  Back Pain This is a chronic problem. The current episode started more than 1 month ago. The problem occurs constantly. The problem has been waxing and waning since onset. The pain is present in the gluteal and lumbar spine. The quality of the pain is described as aching. The pain radiates to the right thigh. The pain is at a severity of 9/10. The pain is moderate. The symptoms are aggravated by twisting and sitting. Associated symptoms include leg pain, numbness, tingling and weakness. Pertinent negatives include no bladder incontinence, bowel incontinence or dysuria. He has tried analgesics, bed rest and NSAIDs for the symptoms. The treatment provided mild relief.      Review of Systems  HENT: Negative.   Respiratory: Negative.   Cardiovascular: Negative.   Gastrointestinal: Negative.  Negative for bowel incontinence.  Endocrine: Negative.   Genitourinary: Negative for bladder incontinence and dysuria.  Musculoskeletal: Positive for back pain.  Neurological: Positive for tingling, weakness and numbness.  Hematological: Negative.   Psychiatric/Behavioral: Negative.   All other systems reviewed and are negative.      Objective:   Physical Exam  Constitutional: He is oriented to person, place, and time. He appears well-developed and well-nourished. No distress.  HENT:  Head: Normocephalic.  Eyes: Pupils are equal, round, and reactive to light. Right eye exhibits no discharge. Left eye exhibits no discharge.  Neck: Normal range of motion. Neck supple. No thyromegaly present.  Cardiovascular: Normal rate, regular rhythm, normal heart  sounds and intact distal pulses.   No murmur heard. Pulmonary/Chest: Effort normal and breath sounds normal. No respiratory distress. He has no wheezes.  Abdominal: Soft. Bowel sounds are normal. He exhibits no distension. There is no tenderness.  Musculoskeletal: Normal range of motion. He exhibits no edema or tenderness.  Neurological: He is alert and oriented to person, place, and time. He has normal reflexes. No cranial nerve deficit.  Skin: Skin is warm and dry. No rash noted. No erythema.  Psychiatric: He has a normal mood and affect. His behavior is normal. Judgment and thought content normal.  Vitals reviewed.     BP 131/81 mmHg  Pulse 103  Temp(Src) 97.7 F (36.5 C) (Oral)  Ht  (1.702 m)  Wt 241 lb (109.317 kg)  BMI 37.74 kg/m2     Assessment & Plan:  1. Type 2 diabetes mellitus treated with insulin (HCC) -Low carb diet!! -Take all medications- Pt states he has not taken trulicity in last few weeks - Dulaglutide (TRULICITY) 0.75 MG/0.5ML SOPN; One injection of 0.75mg   weekly  Dispense: 12 pen; Refill: 1  2. Right-sided low back pain with right-sided sciatica -Rest -Ice as needed -Pt to call make Ortho appt and schedule MRI! - ketorolac (TORADOL) injection 60 mg; Inject 2 mLs (60 mg total) into the muscle once. - methylPREDNISolone acetate (DEPO-MEDROL) injection 40 mg; Inject 0.5 mLs (40 mg total) into the muscle once.  3. SMOKER - Smoking cessation discussed  4. Asthma, mild intermittent, uncomplicated  5. Chronic obstructive pulmonary disease, unspecified COPD type (HCC) -Smoking cessation discussed -Spiriva added today! Continue Advair -  tiotropium (SPIRIVA HANDIHALER) 18 MCG inhalation capsule; Place 1 capsule (18 mcg total) into inhaler and inhale daily.  Dispense: 30 capsule; Refill: 12  [name], FNP

## 2015-03-19 DIAGNOSIS — H1851 Endothelial corneal dystrophy: Secondary | ICD-10-CM | POA: Diagnosis not present

## 2015-03-19 DIAGNOSIS — H20042 Secondary noninfectious iridocyclitis, left eye: Secondary | ICD-10-CM | POA: Diagnosis not present

## 2015-03-20 DIAGNOSIS — H20042 Secondary noninfectious iridocyclitis, left eye: Secondary | ICD-10-CM | POA: Diagnosis not present

## 2015-03-21 DIAGNOSIS — H20042 Secondary noninfectious iridocyclitis, left eye: Secondary | ICD-10-CM | POA: Diagnosis not present

## 2015-03-22 ENCOUNTER — Other Ambulatory Visit: Payer: Self-pay | Admitting: Family

## 2015-03-24 DIAGNOSIS — H20042 Secondary noninfectious iridocyclitis, left eye: Secondary | ICD-10-CM | POA: Diagnosis not present

## 2015-03-25 ENCOUNTER — Ambulatory Visit (INDEPENDENT_AMBULATORY_CARE_PROVIDER_SITE_OTHER): Payer: Medicare Other | Admitting: Family

## 2015-03-25 ENCOUNTER — Encounter: Payer: Self-pay | Admitting: Family

## 2015-03-25 VITALS — BP 129/94 | HR 86 | Temp 97.2°F | Ht 67.0 in | Wt 242.8 lb

## 2015-03-25 DIAGNOSIS — E1142 Type 2 diabetes mellitus with diabetic polyneuropathy: Secondary | ICD-10-CM | POA: Diagnosis not present

## 2015-03-25 DIAGNOSIS — F329 Major depressive disorder, single episode, unspecified: Secondary | ICD-10-CM

## 2015-03-25 DIAGNOSIS — E039 Hypothyroidism, unspecified: Secondary | ICD-10-CM | POA: Diagnosis not present

## 2015-03-25 DIAGNOSIS — F32A Depression, unspecified: Secondary | ICD-10-CM

## 2015-03-25 DIAGNOSIS — K219 Gastro-esophageal reflux disease without esophagitis: Secondary | ICD-10-CM | POA: Diagnosis not present

## 2015-03-25 DIAGNOSIS — I10 Essential (primary) hypertension: Secondary | ICD-10-CM

## 2015-03-25 DIAGNOSIS — E785 Hyperlipidemia, unspecified: Secondary | ICD-10-CM | POA: Diagnosis not present

## 2015-03-25 DIAGNOSIS — M549 Dorsalgia, unspecified: Secondary | ICD-10-CM | POA: Diagnosis not present

## 2015-03-25 DIAGNOSIS — J4520 Mild intermittent asthma, uncomplicated: Secondary | ICD-10-CM | POA: Diagnosis not present

## 2015-03-25 DIAGNOSIS — I6350 Cerebral infarction due to unspecified occlusion or stenosis of unspecified cerebral artery: Secondary | ICD-10-CM

## 2015-03-25 DIAGNOSIS — Z794 Long term (current) use of insulin: Secondary | ICD-10-CM | POA: Diagnosis not present

## 2015-03-25 DIAGNOSIS — E119 Type 2 diabetes mellitus without complications: Secondary | ICD-10-CM

## 2015-03-25 DIAGNOSIS — F17200 Nicotine dependence, unspecified, uncomplicated: Secondary | ICD-10-CM | POA: Diagnosis not present

## 2015-03-25 DIAGNOSIS — F411 Generalized anxiety disorder: Secondary | ICD-10-CM | POA: Diagnosis not present

## 2015-03-25 DIAGNOSIS — G8929 Other chronic pain: Secondary | ICD-10-CM

## 2015-03-25 MED ORDER — HYDROCODONE-ACETAMINOPHEN 5-325 MG PO TABS: 1.0000 | ORAL_TABLET | Freq: Two times a day (BID) | ORAL | Status: DC | PRN

## 2015-03-25 NOTE — Patient Instructions (Signed)

## 2015-03-25 NOTE — Progress Notes (Signed)
Subjective:    Patient ID: Charles Pennington, male    DOB: [birth year], 50 y.o.   MRN: [id number]  Pt presents to the office today for chronic follow up. PT states he is scheduled for cornea surgery on left eye on 04/09/15. Diabetes He presents for his follow-up diabetic visit. He has type 2 diabetes mellitus. His disease course has been fluctuating. Pertinent negatives for hypoglycemia include no confusion, dizziness, headaches or sleepiness. Associated symptoms include visual change. Pertinent negatives for diabetes include no blurred vision, no fatigue, no foot paresthesias and no foot ulcerations. There are no hypoglycemic complications. Symptoms are stable. Diabetic complications include a CVA and peripheral neuropathy. Pertinent negatives for diabetic complications include no heart disease or nephropathy. Risk factors for coronary artery disease include diabetes mellitus, dyslipidemia, hypertension, male sex and tobacco exposure. Current diabetic treatment includes insulin injections and oral agent (monotherapy). He is compliant with treatment all of the time. His weight is stable. He is following a generally healthy diet. He participates in exercise three times a week. His breakfast blood glucose range is generally 140-180 mg/dl. An ACE inhibitor/angiotensin II receptor blocker is being taken. Eye exam is current.  Hypertension This is a chronic problem. The current episode started more than 1 year ago. The problem has been resolved since onset. The problem is controlled. Associated symptoms include anxiety. Pertinent negatives include no blurred vision, headaches, malaise/fatigue, palpitations, peripheral edema or shortness of breath. Risk factors for coronary artery disease include diabetes mellitus, dyslipidemia, male gender, obesity and smoking/tobacco exposure. Past treatments include calcium channel blockers. The current treatment provides mild improvement. Hypertensive end-organ damage  includes CVA and a thyroid problem. There is no history of kidney disease, CAD/MI or heart failure. There is no history of sleep apnea.  Hyperlipidemia This is a chronic problem. The current episode started more than 1 year ago. The problem is uncontrolled. Recent lipid tests were reviewed and are high. Exacerbating diseases include diabetes and hypothyroidism. Factors aggravating his hyperlipidemia include smoking. Pertinent negatives include no leg pain, myalgias or shortness of breath. Current antihyperlipidemic treatment includes statins. The current treatment provides significant improvement of lipids. Risk factors for coronary artery disease include diabetes mellitus, dyslipidemia, hypertension, male sex and obesity.  Thyroid Problem Presents for follow-up visit. Symptoms include depressed mood and visual change. Patient reports no constipation, diarrhea, dry skin, fatigue, leg swelling, nail problem or palpitations. The symptoms have been stable. Past treatments include levothyroxine. The treatment provided significant relief. His past medical history is significant for diabetes and hyperlipidemia. There is no history of heart failure.  Anxiety Presents for follow-up visit. Symptoms include depressed mood. Patient reports no confusion, dizziness, insomnia, nausea, palpitations or shortness of breath. Symptoms occur rarely. The quality of sleep is good. Nighttime awakenings: none.   His past medical history is significant for anxiety/panic attacks and depression. There is no history of arrhythmia, CAD, CHF or fibromyalgia. Past treatments include SSRIs and benzodiazephines.  Gastroesophageal Reflux He reports no belching, no coughing, no heartburn, no nausea, no sore throat or no tooth decay. This is a chronic problem. The current episode started more than 1 year ago. The problem occurs rarely. The problem has been resolved. Pertinent negatives include no fatigue. Risk factors include caffeine use.  He has tried a PPI for the symptoms. The treatment provided significant relief.  COPD PT currently advair and spiriva. PT states his breathing is "better", but states he is smoking a pack of cigarettes every 3 days.  Review of Systems  Constitutional: Negative.  Negative for malaise/fatigue and fatigue.  HENT: Negative.  Negative for sore throat.   Eyes: Negative for blurred vision.  Respiratory: Negative.  Negative for cough and shortness of breath.   Cardiovascular: Negative.  Negative for palpitations.  Gastrointestinal: Negative.  Negative for heartburn, nausea, diarrhea and constipation.  Endocrine: Negative.   Genitourinary: Negative.   Musculoskeletal: Negative.  Negative for myalgias.  Neurological: Negative.  Negative for dizziness and headaches.  Hematological: Negative.   Psychiatric/Behavioral: Negative.  Negative for confusion. The patient does not have insomnia.   All other systems reviewed and are negative.      Objective:   Physical Exam  Constitutional: He is oriented to person, place, and time. He appears well-developed and well-nourished. No distress.  HENT:  Head: Normocephalic.  Right Ear: External ear normal.  Left Ear: External ear normal.  Nose: Nose normal.  Mouth/Throat: Oropharynx is clear and moist.  Eyes: Pupils are equal, round, and reactive to light. Right eye exhibits no discharge. Left eye exhibits no discharge.  Neck: Normal range of motion. Neck supple. No thyromegaly present.  Cardiovascular: Normal rate, regular rhythm, normal heart sounds and intact distal pulses.   No murmur heard. Pulmonary/Chest: Effort normal and breath sounds normal. No respiratory distress. He has no wheezes.  Abdominal: Soft. Bowel sounds are normal. He exhibits no distension. There is no tenderness.  Musculoskeletal: Normal range of motion. He exhibits no edema or tenderness.  Neurological: He is alert and oriented to person, place, and time. He has normal  reflexes. No cranial nerve deficit.  Skin: Skin is warm and dry. No rash noted. No erythema.  Psychiatric: He has a normal mood and affect. His behavior is normal. Judgment and thought content normal.  Vitals reviewed.     BP 129/94 mmHg  Pulse 86  Temp(Src) 97.2 F (36.2 C) (Oral)  Ht 5' 7" (1.702 m)  Wt 242 lb 12.8 oz (110.133 kg)  BMI 38.02 kg/m2     Assessment & Plan:  1. Essential hypertension - CMP14+EGFR  2. Cerebral artery occlusion with cerebral infarction (HCC) - CMP14+EGFR  3. Asthma, mild intermittent, uncomplicated - CMP14+EGFR  4. Gastroesophageal reflux disease, esophagitis presence not specified - CMP14+EGFR  5. Hypothyroidism, unspecified hypothyroidism type - CMP14+EGFR - Thyroid Panel With TSH  6. Type 2 diabetes mellitus treated with insulin (HCC) - CMP14+EGFR - Microalbumin / creatinine urine ratio  7. Diabetic polyneuropathy associated with type 2 diabetes mellitus (HCC) - CMP14+EGFR - Microalbumin / creatinine urine ratio  8. Depression - CMP14+EGFR  9. GAD (generalized anxiety disorder) - CMP14+EGFR  10. SMOKER - CMP14+EGFR  11. Hyperlipidemia - CMP14+EGFR - Lipid panel   Continue all meds Labs pending Health Maintenance reviewed Diet and exercise encouraged RTO 3 months  [name], FNP   

## 2015-03-26 LAB — LIPID PANEL
CHOLESTEROL TOTAL: 253 mg/dL — AB (ref 100–199)
Chol/HDL Ratio: 6 ratio units — ABNORMAL HIGH (ref 0.0–5.0)
HDL: 42 mg/dL (ref >39)
LDL CALC: 171 mg/dL — AB (ref 0–99)
Triglycerides: 201 mg/dL — ABNORMAL HIGH (ref 0–149)
VLDL CHOLESTEROL CAL: 40 mg/dL (ref 5–40)

## 2015-03-26 LAB — CMP14+EGFR
ALBUMIN: 4.4 g/dL (ref 3.5–5.5)
ALT: 15 IU/L (ref 0–44)
AST: 12 IU/L (ref 0–40)
Albumin/Globulin Ratio: 1.6 (ref 1.1–2.5)
Alkaline Phosphatase: 82 IU/L (ref 39–117)
BUN / CREAT RATIO: 17 (ref 9–20)
BUN: 15 mg/dL (ref 6–24)
Bilirubin Total: <0.2 mg/dL (ref 0.0–1.2)
CO2: 25 mmol/L (ref 18–29)
CREATININE: 0.9 mg/dL (ref 0.76–1.27)
Calcium: 10 mg/dL (ref 8.7–10.2)
Chloride: 95 mmol/L — ABNORMAL LOW (ref 96–106)
GFR calc non Af Amer: 101 mL/min/{1.73_m2} (ref >59)
GFR, EST AFRICAN AMERICAN: 116 mL/min/{1.73_m2} (ref >59)
GLUCOSE: 261 mg/dL — AB (ref 65–99)
Globulin, Total: 2.8 g/dL (ref 1.5–4.5)
Potassium: 4.5 mmol/L (ref 3.5–5.2)
Sodium: 138 mmol/L (ref 134–144)
TOTAL PROTEIN: 7.2 g/dL (ref 6.0–8.5)

## 2015-03-26 LAB — THYROID PANEL WITH TSH
Free Thyroxine Index: 3.3 (ref 1.2–4.9)
T3 Uptake Ratio: 34 % (ref 24–39)
T4 TOTAL: 9.8 ug/dL (ref 4.5–12.0)
TSH: 0.604 u[IU]/mL (ref 0.450–4.500)

## 2015-04-02 ENCOUNTER — Other Ambulatory Visit: Payer: Self-pay | Admitting: Family Medicine

## 2015-04-03 DIAGNOSIS — Z794 Long term (current) use of insulin: Secondary | ICD-10-CM | POA: Diagnosis not present

## 2015-04-03 DIAGNOSIS — F17200 Nicotine dependence, unspecified, uncomplicated: Secondary | ICD-10-CM | POA: Diagnosis not present

## 2015-04-03 DIAGNOSIS — I10 Essential (primary) hypertension: Secondary | ICD-10-CM | POA: Diagnosis not present

## 2015-04-03 DIAGNOSIS — H1812 Bullous keratopathy, left eye: Secondary | ICD-10-CM | POA: Diagnosis not present

## 2015-04-03 DIAGNOSIS — E039 Hypothyroidism, unspecified: Secondary | ICD-10-CM | POA: Diagnosis not present

## 2015-04-03 DIAGNOSIS — Z7982 Long term (current) use of aspirin: Secondary | ICD-10-CM | POA: Diagnosis not present

## 2015-04-03 DIAGNOSIS — E1140 Type 2 diabetes mellitus with diabetic neuropathy, unspecified: Secondary | ICD-10-CM | POA: Diagnosis not present

## 2015-04-03 DIAGNOSIS — E785 Hyperlipidemia, unspecified: Secondary | ICD-10-CM | POA: Diagnosis not present

## 2015-04-03 DIAGNOSIS — H1851 Endothelial corneal dystrophy: Secondary | ICD-10-CM | POA: Diagnosis not present

## 2015-04-03 DIAGNOSIS — Z83518 Family history of other specified eye disorder: Secondary | ICD-10-CM | POA: Diagnosis not present

## 2015-04-03 NOTE — Telephone Encounter (Signed)
rx called into pharmacy

## 2015-04-03 NOTE — Telephone Encounter (Signed)
Last seen 03/25/15 Charles Pennington  If approved route to nurse to call into [facility]

## 2015-04-09 DIAGNOSIS — K219 Gastro-esophageal reflux disease without esophagitis: Secondary | ICD-10-CM | POA: Diagnosis not present

## 2015-04-09 DIAGNOSIS — Z8673 Personal history of transient ischemic attack (TIA), and cerebral infarction without residual deficits: Secondary | ICD-10-CM | POA: Diagnosis not present

## 2015-04-09 DIAGNOSIS — F329 Major depressive disorder, single episode, unspecified: Secondary | ICD-10-CM | POA: Diagnosis not present

## 2015-04-09 DIAGNOSIS — H1851 Endothelial corneal dystrophy: Secondary | ICD-10-CM | POA: Diagnosis not present

## 2015-04-09 DIAGNOSIS — I10 Essential (primary) hypertension: Secondary | ICD-10-CM | POA: Diagnosis not present

## 2015-04-09 DIAGNOSIS — F411 Generalized anxiety disorder: Secondary | ICD-10-CM | POA: Diagnosis not present

## 2015-04-09 DIAGNOSIS — H1812 Bullous keratopathy, left eye: Secondary | ICD-10-CM | POA: Diagnosis not present

## 2015-04-09 DIAGNOSIS — E785 Hyperlipidemia, unspecified: Secondary | ICD-10-CM | POA: Diagnosis not present

## 2015-04-09 DIAGNOSIS — Z794 Long term (current) use of insulin: Secondary | ICD-10-CM | POA: Diagnosis not present

## 2015-04-09 DIAGNOSIS — E1140 Type 2 diabetes mellitus with diabetic neuropathy, unspecified: Secondary | ICD-10-CM | POA: Diagnosis not present

## 2015-04-09 DIAGNOSIS — E039 Hypothyroidism, unspecified: Secondary | ICD-10-CM | POA: Diagnosis not present

## 2015-04-09 DIAGNOSIS — J45909 Unspecified asthma, uncomplicated: Secondary | ICD-10-CM | POA: Diagnosis not present

## 2015-04-09 DIAGNOSIS — Z7982 Long term (current) use of aspirin: Secondary | ICD-10-CM | POA: Diagnosis not present

## 2015-04-10 ENCOUNTER — Telehealth: Payer: Self-pay

## 2015-04-10 DIAGNOSIS — Z48810 Encounter for surgical aftercare following surgery on the sense organs: Secondary | ICD-10-CM | POA: Diagnosis not present

## 2015-04-10 DIAGNOSIS — Z7982 Long term (current) use of aspirin: Secondary | ICD-10-CM | POA: Diagnosis not present

## 2015-04-10 DIAGNOSIS — H179 Unspecified corneal scar and opacity: Secondary | ICD-10-CM | POA: Diagnosis not present

## 2015-04-10 DIAGNOSIS — Z794 Long term (current) use of insulin: Secondary | ICD-10-CM | POA: Diagnosis not present

## 2015-04-10 DIAGNOSIS — E1140 Type 2 diabetes mellitus with diabetic neuropathy, unspecified: Secondary | ICD-10-CM | POA: Diagnosis not present

## 2015-04-10 DIAGNOSIS — Z947 Corneal transplant status: Secondary | ICD-10-CM | POA: Diagnosis not present

## 2015-04-10 DIAGNOSIS — H2513 Age-related nuclear cataract, bilateral: Secondary | ICD-10-CM | POA: Diagnosis not present

## 2015-04-10 DIAGNOSIS — F17200 Nicotine dependence, unspecified, uncomplicated: Secondary | ICD-10-CM | POA: Diagnosis not present

## 2015-04-10 DIAGNOSIS — H5710 Ocular pain, unspecified eye: Secondary | ICD-10-CM | POA: Diagnosis not present

## 2015-04-10 DIAGNOSIS — H1851 Endothelial corneal dystrophy: Secondary | ICD-10-CM | POA: Diagnosis not present

## 2015-04-10 DIAGNOSIS — H209 Unspecified iridocyclitis: Secondary | ICD-10-CM | POA: Diagnosis not present

## 2015-04-10 MED ORDER — INSULIN DEGLUDEC 200 UNIT/ML ~~LOC~~ SOPN: 45.0000 [IU] | PEN_INJECTOR | Freq: Every day | SUBCUTANEOUS | Status: DC

## 2015-04-10 NOTE — Telephone Encounter (Signed)
Pt aware.

## 2015-04-10 NOTE — Telephone Encounter (Signed)
Lantus Solostar non formulary with insurance    It says Try Charles Pennington or [employer]

## 2015-04-10 NOTE — Telephone Encounter (Signed)
Lantus stopped because of insurance and [place] Prescription sent to pharmacy

## 2015-04-17 ENCOUNTER — Encounter: Payer: Self-pay | Admitting: Family

## 2015-04-17 ENCOUNTER — Ambulatory Visit (INDEPENDENT_AMBULATORY_CARE_PROVIDER_SITE_OTHER): Payer: Medicare Other | Admitting: Family

## 2015-04-17 VITALS — BP 134/87 | HR 112 | Temp 98.0°F | Ht 67.0 in | Wt 242.0 lb

## 2015-04-17 DIAGNOSIS — M5441 Lumbago with sciatica, right side: Secondary | ICD-10-CM

## 2015-04-17 DIAGNOSIS — E039 Hypothyroidism, unspecified: Secondary | ICD-10-CM | POA: Diagnosis not present

## 2015-04-17 DIAGNOSIS — Z794 Long term (current) use of insulin: Secondary | ICD-10-CM | POA: Diagnosis not present

## 2015-04-17 DIAGNOSIS — H1851 Endothelial corneal dystrophy: Secondary | ICD-10-CM | POA: Diagnosis not present

## 2015-04-17 DIAGNOSIS — E119 Type 2 diabetes mellitus without complications: Secondary | ICD-10-CM

## 2015-04-17 DIAGNOSIS — F329 Major depressive disorder, single episode, unspecified: Secondary | ICD-10-CM | POA: Diagnosis not present

## 2015-04-17 DIAGNOSIS — Z947 Corneal transplant status: Secondary | ICD-10-CM | POA: Diagnosis not present

## 2015-04-17 DIAGNOSIS — Z885 Allergy status to narcotic agent status: Secondary | ICD-10-CM | POA: Diagnosis not present

## 2015-04-17 DIAGNOSIS — K219 Gastro-esophageal reflux disease without esophagitis: Secondary | ICD-10-CM

## 2015-04-17 DIAGNOSIS — M549 Dorsalgia, unspecified: Secondary | ICD-10-CM

## 2015-04-17 DIAGNOSIS — H02834 Dermatochalasis of left upper eyelid: Secondary | ICD-10-CM | POA: Diagnosis not present

## 2015-04-17 DIAGNOSIS — I10 Essential (primary) hypertension: Secondary | ICD-10-CM | POA: Diagnosis not present

## 2015-04-17 DIAGNOSIS — G8929 Other chronic pain: Secondary | ICD-10-CM

## 2015-04-17 DIAGNOSIS — F411 Generalized anxiety disorder: Secondary | ICD-10-CM | POA: Diagnosis not present

## 2015-04-17 DIAGNOSIS — Z9889 Other specified postprocedural states: Secondary | ICD-10-CM | POA: Diagnosis not present

## 2015-04-17 DIAGNOSIS — H2513 Age-related nuclear cataract, bilateral: Secondary | ICD-10-CM | POA: Diagnosis not present

## 2015-04-17 DIAGNOSIS — J449 Chronic obstructive pulmonary disease, unspecified: Secondary | ICD-10-CM

## 2015-04-17 DIAGNOSIS — I6350 Cerebral infarction due to unspecified occlusion or stenosis of unspecified cerebral artery: Secondary | ICD-10-CM

## 2015-04-17 DIAGNOSIS — E785 Hyperlipidemia, unspecified: Secondary | ICD-10-CM

## 2015-04-17 DIAGNOSIS — J4520 Mild intermittent asthma, uncomplicated: Secondary | ICD-10-CM

## 2015-04-17 DIAGNOSIS — J45909 Unspecified asthma, uncomplicated: Secondary | ICD-10-CM | POA: Diagnosis not present

## 2015-04-17 DIAGNOSIS — E1142 Type 2 diabetes mellitus with diabetic polyneuropathy: Secondary | ICD-10-CM | POA: Diagnosis not present

## 2015-04-17 DIAGNOSIS — H17821 Peripheral opacity of cornea, right eye: Secondary | ICD-10-CM | POA: Diagnosis not present

## 2015-04-17 DIAGNOSIS — F32A Depression, unspecified: Secondary | ICD-10-CM

## 2015-04-17 DIAGNOSIS — H02831 Dermatochalasis of right upper eyelid: Secondary | ICD-10-CM | POA: Diagnosis not present

## 2015-04-17 DIAGNOSIS — F17200 Nicotine dependence, unspecified, uncomplicated: Secondary | ICD-10-CM | POA: Diagnosis not present

## 2015-04-17 MED ORDER — FLUTICASONE-SALMETEROL 100-50 MCG/DOSE IN AEPB: 1.0000 | INHALATION_SPRAY | Freq: Two times a day (BID) | RESPIRATORY_TRACT | Status: DC

## 2015-04-17 MED ORDER — CANAGLIFLOZIN 300 MG PO TABS: 300.0000 mg | ORAL_TABLET | Freq: Every day | ORAL | Status: DC

## 2015-04-17 MED ORDER — ZOLPIDEM TARTRATE 10 MG PO TABS: 10.0000 mg | ORAL_TABLET | Freq: Every evening | ORAL | Status: DC | PRN

## 2015-04-17 MED ORDER — PAROXETINE HCL 40 MG PO TABS: 40.0000 mg | ORAL_TABLET | Freq: Every day | ORAL | Status: DC

## 2015-04-17 MED ORDER — FENOFIBRATE 160 MG PO TABS: ORAL_TABLET | ORAL | Status: DC

## 2015-04-17 MED ORDER — ALPRAZOLAM 0.5 MG PO TABS: 0.5000 mg | ORAL_TABLET | Freq: Three times a day (TID) | ORAL | Status: DC

## 2015-04-17 MED ORDER — HYDROCODONE-ACETAMINOPHEN 10-325 MG PO TABS: 1.0000 | ORAL_TABLET | Freq: Two times a day (BID) | ORAL | Status: DC | PRN

## 2015-04-17 MED ORDER — INSULIN DEGLUDEC 200 UNIT/ML ~~LOC~~ SOPN: 45.0000 [IU] | PEN_INJECTOR | Freq: Every day | SUBCUTANEOUS | Status: DC

## 2015-04-17 MED ORDER — ATORVASTATIN CALCIUM 80 MG PO TABS: 80.0000 mg | ORAL_TABLET | Freq: Every day | ORAL | Status: DC

## 2015-04-17 MED ORDER — DULAGLUTIDE 0.75 MG/0.5ML ~~LOC~~ SOAJ: SUBCUTANEOUS | Status: DC

## 2015-04-17 MED ORDER — LEVOTHYROXINE SODIUM 200 MCG PO TABS: ORAL_TABLET | ORAL | Status: DC

## 2015-04-17 MED ORDER — LISINOPRIL 20 MG PO TABS
20.0000 mg | ORAL_TABLET | Freq: Every day | ORAL | Status: DC
Start: 2015-04-17 — End: 2016-04-27

## 2015-04-17 MED ORDER — GABAPENTIN 300 MG PO CAPS: 300.0000 mg | ORAL_CAPSULE | Freq: Every day | ORAL | Status: DC

## 2015-04-17 MED ORDER — TIOTROPIUM BROMIDE MONOHYDRATE 18 MCG IN CAPS: 18.0000 ug | ORAL_CAPSULE | Freq: Every day | RESPIRATORY_TRACT | Status: DC

## 2015-04-17 MED ORDER — AMLODIPINE BESYLATE 5 MG PO TABS: ORAL_TABLET | ORAL | Status: DC

## 2015-04-17 MED ORDER — OMEPRAZOLE 20 MG PO CPDR: DELAYED_RELEASE_CAPSULE | ORAL | Status: DC

## 2015-04-17 MED ORDER — CYCLOBENZAPRINE HCL 10 MG PO TABS: 10.0000 mg | ORAL_TABLET | Freq: Three times a day (TID) | ORAL | Status: DC | PRN

## 2015-04-17 NOTE — Progress Notes (Signed)
Subjective:    Patient ID: Charles Pennington, male    DOB: July 19, 1965, 50 y.o.   MRN: [id number]  PT presents to the office today to discuss back pain and is requesting to be sent to a pain clinic. Pt is a poor historian and can not recall all of his medications. PT states he is only taking 4-5 medications a day. PT states when he goes to the pharmacy he picks up whatever they say is ready to fill.  Back Pain This is a chronic problem. The current episode started more than 1 year ago. The problem occurs constantly. The problem is unchanged. The pain is present in the lumbar spine. The quality of the pain is described as aching. The pain is at a severity of 10/10. The pain is severe. The symptoms are aggravated by bending. Associated symptoms include leg pain, numbness and tingling. Pertinent negatives include no bladder incontinence. He has tried analgesics for the symptoms. The treatment provided mild relief.      Review of Systems  Constitutional: Negative.   HENT: Negative.   Respiratory: Negative.   Cardiovascular: Negative.   Gastrointestinal: Negative.   Endocrine: Negative.   Genitourinary: Negative.  Negative for bladder incontinence.  Musculoskeletal: Positive for back pain.  Neurological: Positive for tingling and numbness.  Hematological: Negative.   Psychiatric/Behavioral: Negative.   All other systems reviewed and are negative.      Objective:   Physical Exam  Constitutional: He is oriented to person, place, and time. He appears well-developed and well-nourished. No distress.  HENT:  Head: Normocephalic.  Eyes: Pupils are equal, round, and reactive to light. Right eye exhibits no discharge. Left eye exhibits no discharge.  Neck: Normal range of motion. Neck supple. No thyromegaly present.  Cardiovascular: Normal rate, regular rhythm, normal heart sounds and intact distal pulses.   No murmur heard. Pulmonary/Chest: Effort normal and breath sounds normal. No  respiratory distress. He has no wheezes.  Abdominal: Soft. Bowel sounds are normal. He exhibits no distension. There is no tenderness.  Musculoskeletal: Normal range of motion. He exhibits edema. He exhibits no tenderness.  Positive for straight leg raise  Neurological: He is alert and oriented to person, place, and time.  Skin: Skin is warm and dry. No rash noted. No erythema.  Psychiatric: He has a normal mood and affect. His behavior is normal. Judgment and thought content normal.  Vitals reviewed.   BP 134/87 mmHg  Pulse 112  Temp(Src) 98 F (36.7 C) (Oral)  Ht  (1.702 m)  Wt 242 lb (109.77 kg)  BMI 37.89 kg/m2       Assessment & Plan:  1. Essential hypertension - amLODipine (NORVASC) 5 MG tablet; TAKE 1 TABLET (5 MG TOTAL) BY MOUTH DAILY.  Dispense: 90 tablet; Refill: 3 - lisinopril (PRINIVIL,ZESTRIL) 20 MG tablet; Take 1 tablet (20 mg total) by mouth daily.  Dispense: 90 tablet; Refill: 3  2. Hyperlipidemia - atorvastatin (LIPITOR) 80 MG tablet; Take 1 tablet (80 mg total) by mouth daily.  Dispense: 90 tablet; Refill: 3  3. Diabetic polyneuropathy associated with type 2 diabetes mellitus (HCC) - canagliflozin (INVOKANA) 300 MG TABS tablet; Take 1 tablet (300 mg total) by mouth daily before breakfast.  Dispense: 90 tablet; Refill: 2  4. Type 2 diabetes mellitus treated with insulin (HCC) - canagliflozin (INVOKANA) 300 MG TABS tablet; Take 1 tablet (300 mg total) by mouth daily before breakfast.  Dispense: 90 tablet; RATZEL MCCAMBRIDGEglutide (TRULICITY) 0.75 MG/0.5ML SOPN; One  injection of 0.75mg   weekly  Dispense: 12 pen; Refill: 1  5. Chronic back pain -Pt's Norco increased to 10-325 from 5-325 mg - gabapentin (NEURONTIN) 300 MG capsule; Take 1 capsule (300 mg total) by mouth at bedtime.  Dispense: 90 capsule; Refill: 1 - HYDROcodone-acetaminophen (NORCO) 10-325 MG tablet; Take 1 tablet by mouth every 12 (twelve) hours as needed.  Dispense: 60 tablet; Refill: 0 -  cyclobenzaprine (FLEXERIL) 10 MG tablet; Take 1 tablet (10 mg total) by mouth 3 (three) times daily as needed for muscle spasms.  Dispense: 30 tablet; Refill: 0  6. Right-sided low back pain with right-sided sciatica - gabapentin (NEURONTIN) 300 MG capsule; Take 1 capsule (300 mg total) by mouth at bedtime.  Dispense: 90 capsule; Refill: 1  7. Asthma, mild intermittent, uncomplicated - Fluticasone-Salmeterol (ADVAIR DISKUS) 100-50 MCG/DOSE AEPB; Inhale 1 puff into the lungs 2 (two) times daily.  Dispense: 60 each; Refill: 3  8. Gastroesophageal reflux disease, esophagitis presence not specified - omeprazole (PRILOSEC) 20 MG capsule; TAKE 1 CAPSULE (20 MG TOTAL) BY MOUTH DAILY.  Dispense: 90 capsule; Refill: 3  9. Depression - PARoxetine (PAXIL) 40 MG tablet; Take 1 tablet (40 mg total) by mouth daily.  Dispense: 90 tablet; Refill: 3  10. GAD (generalized anxiety disorder) - PARoxetine (PAXIL) 40 MG tablet; Take 1 tablet (40 mg total) by mouth daily.  Dispense: 90 tablet; Refill: 3  11. Chronic obstructive pulmonary disease, unspecified COPD type (HCC) - tiotropium (SPIRIVA HANDIHALER) 18 MCG inhalation capsule; Place 1 capsule (18 mcg total) into inhaler and inhale daily.  Dispense: 30 capsule; Refill: 12  *All of patients medications was reordered to [facility]. PT told to tell them to prepackage his medication for two weeks at a time. Long discussion about medications and importance of taking all of her medications.   Continue all meds Labs pending Health Maintenance reviewed Diet and exercise encouraged RTO 3 months  [name], FNP

## 2015-04-17 NOTE — Patient Instructions (Signed)

## 2015-04-30 ENCOUNTER — Telehealth: Payer: Self-pay

## 2015-04-30 NOTE — Telephone Encounter (Signed)
Insurance prior authorized Trulicity through 02/15/16

## 2015-05-08 DIAGNOSIS — Z8673 Personal history of transient ischemic attack (TIA), and cerebral infarction without residual deficits: Secondary | ICD-10-CM | POA: Diagnosis not present

## 2015-05-08 DIAGNOSIS — H02834 Dermatochalasis of left upper eyelid: Secondary | ICD-10-CM | POA: Diagnosis not present

## 2015-05-08 DIAGNOSIS — H2513 Age-related nuclear cataract, bilateral: Secondary | ICD-10-CM | POA: Diagnosis not present

## 2015-05-08 DIAGNOSIS — Z79899 Other long term (current) drug therapy: Secondary | ICD-10-CM | POA: Diagnosis not present

## 2015-05-08 DIAGNOSIS — H1851 Endothelial corneal dystrophy: Secondary | ICD-10-CM | POA: Diagnosis not present

## 2015-05-08 DIAGNOSIS — H02831 Dermatochalasis of right upper eyelid: Secondary | ICD-10-CM | POA: Diagnosis not present

## 2015-05-08 DIAGNOSIS — E785 Hyperlipidemia, unspecified: Secondary | ICD-10-CM | POA: Diagnosis not present

## 2015-05-08 DIAGNOSIS — Z7951 Long term (current) use of inhaled steroids: Secondary | ICD-10-CM | POA: Diagnosis not present

## 2015-05-08 DIAGNOSIS — Z7982 Long term (current) use of aspirin: Secondary | ICD-10-CM | POA: Diagnosis not present

## 2015-05-08 DIAGNOSIS — F17200 Nicotine dependence, unspecified, uncomplicated: Secondary | ICD-10-CM | POA: Diagnosis not present

## 2015-05-08 DIAGNOSIS — E119 Type 2 diabetes mellitus without complications: Secondary | ICD-10-CM | POA: Diagnosis not present

## 2015-05-08 DIAGNOSIS — Z885 Allergy status to narcotic agent status: Secondary | ICD-10-CM | POA: Diagnosis not present

## 2015-05-08 DIAGNOSIS — Z947 Corneal transplant status: Secondary | ICD-10-CM | POA: Diagnosis not present

## 2015-05-08 DIAGNOSIS — H17821 Peripheral opacity of cornea, right eye: Secondary | ICD-10-CM | POA: Diagnosis not present

## 2015-05-08 DIAGNOSIS — I10 Essential (primary) hypertension: Secondary | ICD-10-CM | POA: Diagnosis not present

## 2015-05-08 DIAGNOSIS — Z48810 Encounter for surgical aftercare following surgery on the sense organs: Secondary | ICD-10-CM | POA: Diagnosis not present

## 2015-05-08 DIAGNOSIS — Z794 Long term (current) use of insulin: Secondary | ICD-10-CM | POA: Diagnosis not present

## 2015-05-16 ENCOUNTER — Other Ambulatory Visit: Payer: Self-pay | Admitting: Family

## 2015-05-16 DIAGNOSIS — M549 Dorsalgia, unspecified: Principal | ICD-10-CM

## 2015-05-16 DIAGNOSIS — G8929 Other chronic pain: Secondary | ICD-10-CM

## 2015-05-16 MED ORDER — HYDROCODONE-ACETAMINOPHEN 10-325 MG PO TABS: 1.0000 | ORAL_TABLET | Freq: Two times a day (BID) | ORAL | Status: DC | PRN

## 2015-05-16 NOTE — Telephone Encounter (Signed)
Aware, script is ready. 

## 2015-05-16 NOTE — Telephone Encounter (Signed)
Last seen and filled 04/17/15

## 2015-05-16 NOTE — Telephone Encounter (Signed)
RX ready for pick up 

## 2015-05-26 ENCOUNTER — Other Ambulatory Visit: Payer: Self-pay | Admitting: *Deleted

## 2015-05-26 MED ORDER — ALBUTEROL SULFATE HFA 108 (90 BASE) MCG/ACT IN AERS: INHALATION_SPRAY | RESPIRATORY_TRACT | Status: DC

## 2015-05-28 ENCOUNTER — Telehealth: Payer: Self-pay | Admitting: Family

## 2015-05-28 NOTE — Telephone Encounter (Signed)
lyrica is not on his list of n=meds what dose and how much was he on?

## 2015-05-29 NOTE — Telephone Encounter (Signed)
Pt notified Verbalizes understanding 

## 2015-05-29 NOTE — Telephone Encounter (Signed)
Pt is on gabapentin now instead of lyric.

## 2015-06-05 DIAGNOSIS — H02831 Dermatochalasis of right upper eyelid: Secondary | ICD-10-CM | POA: Diagnosis not present

## 2015-06-05 DIAGNOSIS — I10 Essential (primary) hypertension: Secondary | ICD-10-CM | POA: Diagnosis not present

## 2015-06-05 DIAGNOSIS — E039 Hypothyroidism, unspecified: Secondary | ICD-10-CM | POA: Diagnosis not present

## 2015-06-05 DIAGNOSIS — F17200 Nicotine dependence, unspecified, uncomplicated: Secondary | ICD-10-CM | POA: Diagnosis not present

## 2015-06-05 DIAGNOSIS — E1140 Type 2 diabetes mellitus with diabetic neuropathy, unspecified: Secondary | ICD-10-CM | POA: Diagnosis not present

## 2015-06-05 DIAGNOSIS — Z7982 Long term (current) use of aspirin: Secondary | ICD-10-CM | POA: Diagnosis not present

## 2015-06-05 DIAGNOSIS — E785 Hyperlipidemia, unspecified: Secondary | ICD-10-CM | POA: Diagnosis not present

## 2015-06-05 DIAGNOSIS — H17821 Peripheral opacity of cornea, right eye: Secondary | ICD-10-CM | POA: Diagnosis not present

## 2015-06-05 DIAGNOSIS — Z79899 Other long term (current) drug therapy: Secondary | ICD-10-CM | POA: Diagnosis not present

## 2015-06-05 DIAGNOSIS — Z886 Allergy status to analgesic agent status: Secondary | ICD-10-CM | POA: Diagnosis not present

## 2015-06-05 DIAGNOSIS — H2513 Age-related nuclear cataract, bilateral: Secondary | ICD-10-CM | POA: Diagnosis not present

## 2015-06-05 DIAGNOSIS — Z947 Corneal transplant status: Secondary | ICD-10-CM | POA: Diagnosis not present

## 2015-06-05 DIAGNOSIS — Z885 Allergy status to narcotic agent status: Secondary | ICD-10-CM | POA: Diagnosis not present

## 2015-06-05 DIAGNOSIS — Z7951 Long term (current) use of inhaled steroids: Secondary | ICD-10-CM | POA: Diagnosis not present

## 2015-06-05 DIAGNOSIS — J45909 Unspecified asthma, uncomplicated: Secondary | ICD-10-CM | POA: Diagnosis not present

## 2015-06-05 DIAGNOSIS — H02834 Dermatochalasis of left upper eyelid: Secondary | ICD-10-CM | POA: Diagnosis not present

## 2015-06-05 DIAGNOSIS — Z794 Long term (current) use of insulin: Secondary | ICD-10-CM | POA: Diagnosis not present

## 2015-06-05 DIAGNOSIS — Z48810 Encounter for surgical aftercare following surgery on the sense organs: Secondary | ICD-10-CM | POA: Diagnosis not present

## 2015-06-16 ENCOUNTER — Telehealth: Payer: Self-pay | Admitting: Family

## 2015-06-16 DIAGNOSIS — G8929 Other chronic pain: Secondary | ICD-10-CM

## 2015-06-16 DIAGNOSIS — M549 Dorsalgia, unspecified: Principal | ICD-10-CM

## 2015-06-16 MED ORDER — HYDROCODONE-ACETAMINOPHEN 10-325 MG PO TABS: 1.0000 | ORAL_TABLET | Freq: Two times a day (BID) | ORAL | Status: DC | PRN

## 2015-06-16 NOTE — Telephone Encounter (Signed)
RX ready for pick up 

## 2015-06-16 NOTE — Telephone Encounter (Signed)
Rx given to patient here in office

## 2015-06-18 ENCOUNTER — Encounter (INDEPENDENT_AMBULATORY_CARE_PROVIDER_SITE_OTHER): Payer: Self-pay

## 2015-06-19 ENCOUNTER — Ambulatory Visit: Payer: Medicare Other | Admitting: Family

## 2015-06-23 ENCOUNTER — Ambulatory Visit (INDEPENDENT_AMBULATORY_CARE_PROVIDER_SITE_OTHER): Payer: Medicare Other | Admitting: Family

## 2015-06-23 ENCOUNTER — Encounter: Payer: Self-pay | Admitting: Family

## 2015-06-23 VITALS — BP 118/81 | HR 98 | Temp 98.4°F | Ht 67.0 in | Wt 230.0 lb

## 2015-06-23 DIAGNOSIS — M5441 Lumbago with sciatica, right side: Secondary | ICD-10-CM | POA: Diagnosis not present

## 2015-06-23 DIAGNOSIS — M549 Dorsalgia, unspecified: Secondary | ICD-10-CM | POA: Diagnosis not present

## 2015-06-23 DIAGNOSIS — G8929 Other chronic pain: Secondary | ICD-10-CM | POA: Diagnosis not present

## 2015-06-23 DIAGNOSIS — H6122 Impacted cerumen, left ear: Secondary | ICD-10-CM | POA: Diagnosis not present

## 2015-06-23 DIAGNOSIS — Z794 Long term (current) use of insulin: Secondary | ICD-10-CM

## 2015-06-23 DIAGNOSIS — I6350 Cerebral infarction due to unspecified occlusion or stenosis of unspecified cerebral artery: Secondary | ICD-10-CM | POA: Diagnosis not present

## 2015-06-23 DIAGNOSIS — E119 Type 2 diabetes mellitus without complications: Secondary | ICD-10-CM

## 2015-06-23 LAB — BAYER DCA HB A1C WAIVED

## 2015-06-23 MED ORDER — GABAPENTIN 300 MG PO CAPS: 300.0000 mg | ORAL_CAPSULE | Freq: Three times a day (TID) | ORAL | Status: DC

## 2015-06-23 MED ORDER — DULAGLUTIDE 0.75 MG/0.5ML ~~LOC~~ SOAJ: SUBCUTANEOUS | Status: DC

## 2015-06-23 MED ORDER — CYCLOBENZAPRINE HCL 10 MG PO TABS: 10.0000 mg | ORAL_TABLET | Freq: Three times a day (TID) | ORAL | Status: DC | PRN

## 2015-06-23 NOTE — Patient Instructions (Signed)

## 2015-06-23 NOTE — Progress Notes (Signed)
Subjective:    Patient ID: Charles Pennington, male    DOB: 11/25/1965, 50 y.o.   MRN: [id number]  PT states he was moving furniture all  Weekend and "flared up" his back.  Back Pain This is a chronic problem. The current episode started more than 1 year ago. The problem occurs constantly. The problem is unchanged. The pain is present in the lumbar spine. The quality of the pain is described as aching. The pain is at a severity of 9/10. The pain is moderate. The symptoms are aggravated by bending and standing. Associated symptoms include leg pain, numbness, tingling and weakness. Pertinent negatives include no bladder incontinence, bowel incontinence, dysuria or perianal numbness. He has tried analgesics, bed rest, ice and muscle relaxant for the symptoms. The treatment provided mild relief.  Otalgia  There is pain in the left ear. This is a new problem. The current episode started today. There has been no fever. The pain is at a severity of 5/10. The pain is mild. Associated symptoms include ear discharge. Pertinent negatives include no coughing, diarrhea, hearing loss, rhinorrhea or sore throat. He has tried nothing for the symptoms. The treatment provided no relief.  Diabetes He presents for his follow-up diabetic visit. He has type 2 diabetes mellitus. Pertinent negatives for hypoglycemia include no confusion or dizziness. Associated symptoms include foot paresthesias and weakness. Pertinent negatives for hypoglycemia complications include no blackouts and no hospitalization. Symptoms are worsening. Diabetic complications include a CVA and peripheral neuropathy. Pertinent negatives for diabetic complications include no heart disease or nephropathy. He is compliant with treatment some of the time. He is following a generally unhealthy diet. His breakfast blood glucose range is generally 130-140 mg/dl. An ACE inhibitor/angiotensin II receptor blocker is being taken. He sees a podiatrist.Eye exam is  current.      Review of Systems  HENT: Positive for ear discharge and ear pain. Negative for hearing loss, rhinorrhea and sore throat.   Respiratory: Negative for cough.   Gastrointestinal: Negative for diarrhea and bowel incontinence.  Genitourinary: Negative for bladder incontinence and dysuria.  Musculoskeletal: Positive for back pain.  Neurological: Positive for tingling, weakness and numbness. Negative for dizziness.  Psychiatric/Behavioral: Negative for confusion.  All other systems reviewed and are negative.      Objective:   Physical Exam  Constitutional: He is oriented to person, place, and time. He appears well-developed and well-nourished. No distress.  HENT:  Head: Normocephalic.  Right Ear: External ear normal.  Mouth/Throat: Oropharynx is clear and moist.  Left ear cerumen impacted   Eyes: Pupils are equal, round, and reactive to light. Right eye exhibits no discharge. Left eye exhibits no discharge.  Neck: Normal range of motion. Neck supple. No thyromegaly present.  Cardiovascular: Normal rate, regular rhythm, normal heart sounds and intact distal pulses.   No murmur heard. Pulmonary/Chest: Effort normal and breath sounds normal. No respiratory distress. He has no wheezes.  Abdominal: Soft. Bowel sounds are normal. He exhibits no distension. There is no tenderness.  Musculoskeletal: Normal range of motion. He exhibits no edema or tenderness.  Neurological: He is alert and oriented to person, place, and time. He has normal reflexes. No cranial nerve deficit.  Skin: Skin is warm and dry. No rash noted. No erythema.  Psychiatric: He has a normal mood and affect. His behavior is normal. Judgment and thought content normal.  Vitals reviewed.   BP 118/81 mmHg  Pulse 98  Temp(Src) 98.4 F (36.9 C) (Oral)  Ht 5'  7" (1.702 m)  Wt 230 lb (104.327 kg)  BMI 36.01 kg/m2       Assessment & Plan:  1. Chronic back pain -Rest -Ice -Continue pain medication -  gabapentin (NEURONTIN) 300 MG capsule; Take 1 capsule (300 mg total) by mouth 3 (three) times daily.  Dispense: 90 capsule; Refill: 1 - cyclobenzaprine (FLEXERIL) 10 MG tablet; Take 1 tablet (10 mg total) by mouth 3 (three) times daily as needed for muscle spasms.  Dispense: 30 tablet; Refill: 0 - CMP14+EGFR  2. Right-sided low back pain with right-sided sciatica - gabapentin (NEURONTIN) 300 MG capsule; Take 1 capsule (300 mg total) by mouth 3 (three) times daily.  Dispense: 90 capsule; Refill: 1 - CMP14+EGFR  3. Type 2 diabetes mellitus treated with insulin (HCC) -Low carb diet -Continue all medications - Dulaglutide (TRULICITY) 0.98 QU/6.7TV SOPN; One injection of 0.75m  weekly  Dispense: 12 pen; Refill: 1 - CMP14+EGFR - Bayer DCA Hb A1c Waived  4. Cerumen impaction, left - CMP14+EGFR  CEvelina Dun FNP

## 2015-06-24 LAB — CMP14+EGFR
A/G RATIO: 1.5 (ref 1.2–2.2)
ALT: 16 IU/L (ref 0–44)
AST: 18 IU/L (ref 0–40)
Albumin: 4.4 g/dL (ref 3.5–5.5)
Alkaline Phosphatase: 77 IU/L (ref 39–117)
BUN/Creatinine Ratio: 13 (ref 9–20)
BUN: 13 mg/dL (ref 6–24)
Bilirubin Total: 0.2 mg/dL (ref 0.0–1.2)
CO2: 23 mmol/L (ref 18–29)
CREATININE: 1.02 mg/dL (ref 0.76–1.27)
Calcium: 9.8 mg/dL (ref 8.7–10.2)
Chloride: 89 mmol/L — ABNORMAL LOW (ref 96–106)
GFR, EST AFRICAN AMERICAN: 99 mL/min/{1.73_m2} (ref >59)
GFR, EST NON AFRICAN AMERICAN: 86 mL/min/{1.73_m2} (ref >59)
GLOBULIN, TOTAL: 2.9 g/dL (ref 1.5–4.5)
GLUCOSE: 296 mg/dL — AB (ref 65–99)
POTASSIUM: 4.1 mmol/L (ref 3.5–5.2)
SODIUM: 131 mmol/L — AB (ref 134–144)
Total Protein: 7.3 g/dL (ref 6.0–8.5)

## 2015-06-26 ENCOUNTER — Encounter: Payer: Self-pay | Admitting: Pharmacist

## 2015-06-26 ENCOUNTER — Ambulatory Visit (INDEPENDENT_AMBULATORY_CARE_PROVIDER_SITE_OTHER): Payer: Medicare Other | Admitting: Pharmacist

## 2015-06-26 ENCOUNTER — Encounter: Payer: Self-pay | Admitting: Family

## 2015-06-26 ENCOUNTER — Ambulatory Visit (INDEPENDENT_AMBULATORY_CARE_PROVIDER_SITE_OTHER): Payer: Medicare Other | Admitting: Family

## 2015-06-26 ENCOUNTER — Encounter: Payer: Self-pay | Admitting: *Deleted

## 2015-06-26 ENCOUNTER — Encounter (INDEPENDENT_AMBULATORY_CARE_PROVIDER_SITE_OTHER): Payer: Self-pay

## 2015-06-26 VITALS — BP 97/64 | HR 108 | Temp 98.3°F | Ht 67.0 in | Wt 230.0 lb

## 2015-06-26 VITALS — BP 101/68 | HR 80 | Ht 67.0 in | Wt 228.0 lb

## 2015-06-26 DIAGNOSIS — E663 Overweight: Secondary | ICD-10-CM | POA: Insufficient documentation

## 2015-06-26 DIAGNOSIS — M5441 Lumbago with sciatica, right side: Secondary | ICD-10-CM | POA: Diagnosis not present

## 2015-06-26 DIAGNOSIS — M961 Postlaminectomy syndrome, not elsewhere classified: Secondary | ICD-10-CM | POA: Diagnosis not present

## 2015-06-26 DIAGNOSIS — Z794 Long term (current) use of insulin: Secondary | ICD-10-CM

## 2015-06-26 DIAGNOSIS — I6350 Cerebral infarction due to unspecified occlusion or stenosis of unspecified cerebral artery: Secondary | ICD-10-CM | POA: Diagnosis not present

## 2015-06-26 DIAGNOSIS — E119 Type 2 diabetes mellitus without complications: Secondary | ICD-10-CM

## 2015-06-26 DIAGNOSIS — E669 Obesity, unspecified: Secondary | ICD-10-CM | POA: Insufficient documentation

## 2015-06-26 MED ORDER — DULAGLUTIDE 1.5 MG/0.5ML ~~LOC~~ SOAJ: 1.5000 mg | SUBCUTANEOUS | Status: DC

## 2015-06-26 MED ORDER — KETOROLAC TROMETHAMINE 60 MG/2ML IM SOLN
60.0000 mg | Freq: Once | INTRAMUSCULAR | Status: AC
  Administered 2015-06-26: 60 mg via INTRAMUSCULAR

## 2015-06-26 MED ORDER — INSULIN DEGLUDEC 200 UNIT/ML ~~LOC~~ SOPN: 80.0000 [IU] | PEN_INJECTOR | Freq: Every day | SUBCUTANEOUS | Status: DC

## 2015-06-26 MED ORDER — BLOOD GLUCOSE MONITOR KIT: PACK | Status: DC

## 2015-06-26 NOTE — Progress Notes (Signed)
Subjective:    Charles Pennington is a 50 y.o. male who presents for evaluation of Type  diabetes mellitus.  I last saw Mr. Birdie RiddleKendrick about 2 years ago for diabetes education and medication adjustment.  His last a1C was greater than 14%.   Current meds for DM: tresiba 44 units per Rx but patient states he has increased this up to 68 units because BG has been elevated (he previsouly took 68 units bid of Lantus prior to switch to [place]), Trulicity 0.75mg  q week (missed 1 dose this week because he is out of medication),   Known diabetic complications: peripheral neuropathy, cardiovascular disease and cerebrovascular disease  Eye exam current (within one year): yes Weight trend: stable Prior visit with dietician: no Current diet: in general, an "unhealthy" diet Current exercise: none - recently injured back and is requesting to see PCP because flexeril prescribed is not helping  Current monitoring regimen: none - patient moved recently and lost his glucometer Home blood sugar records: not testing Any episodes of hypoglycemia? no  Is He on ACE inhibitor or angiotensin II receptor blocker?  Yes  lisinopril (Prinivil)    The following portions of the patient's history were reviewed and updated as appropriate: allergies, current medications, past family history, past medical history, past social history, past surgical history and problem list.    Objective:    BP 101/68 mmHg  Pulse 80  Ht 5\' 7"  (1.702 m)  Wt 228 lb (103.42 kg)  BMI 35.70 kg/m2   RBG was 121 in office today (checked with new glucometer given to patient Lab Review GLUCOSE (mg/dL)  Date Value  16/10/960405/09/2015 296*  03/25/2015 261*  02/21/2015 273*   GLUCOSE, BLD (mg/dL)  Date Value  54/09/811903/02/2014 367*  03/27/2010 240*  03/26/2010 176*   CO2 (mmol/L)  Date Value  06/23/2015 23  03/25/2015 25  02/21/2015 24   BUN (mg/dL)  Date Value  14/78/295605/09/2015 13  03/25/2015 15  02/21/2015 14  04/16/2014 12  03/27/2010 11   03/26/2010 15   CREATININE, SER (mg/dL)  Date Value  21/30/865705/09/2015 1.02  03/25/2015 0.90  02/21/2015 1.24    Assessment:    Diabetes Mellitus type II, under inadequate control.    Plan:    1.  Rx changes: increase trulicity to 1.5mg  weekly   Increase Tresiba to 80 units qd  2.  Education: Reviewed 'ABCs' of diabetes management (respective goals in parentheses):  A1C (<7), blood pressure (<130/80), and cholesterol (LDL <100). 3. Discussed pathophysiology of DM; difference between type 1 and type 2 DM. 4. CHO counting diet discussed.  Reviewed CHO amount in various foods and how to read nutrition labels.  Discussed recommended serving sizes.  5.  Patient given glucometer in office today with Rx for strips and lancet.  He is instructed on HBG goals.  He is to call office if BG remains over 200 or if he gets more than one reading per week less than 80.  6 Follow up: 3 weeks   Henrene Pastorammy Humza [place], PharmD, CPP, CDE

## 2015-06-26 NOTE — Progress Notes (Signed)
Subjective:    Patient ID: Charles Pennington, male    DOB: [birth year], 50 y.o.   MRN: [id number]  PT presents to the office with recurrent low back pain. Pt states he had surgery on his back about 4 years ago. Pt reports that he fell in the shower 2-3 months ago and every since his back has "flared up". PT reports that he can not walk without having to stop. Pt states his pain is constant and is a 10 out 10. Pt is requesting a referral back to his orthopedic.  Back Pain This is a chronic problem. The current episode started more than 1 year ago. The problem occurs constantly. The problem is unchanged. The pain is present in the lumbar spine and gluteal. The quality of the pain is described as aching. The pain radiates to the right thigh. The pain is at a severity of 10/10. The pain is severe. The symptoms are aggravated by bending, lying down and position. Associated symptoms include leg pain, numbness, tingling and weakness. Pertinent negatives include no bladder incontinence, bowel incontinence or dysuria. He has tried bed rest, analgesics, muscle relaxant and NSAIDs for the symptoms. The treatment provided mild relief.  Dizziness Associated symptoms include numbness, a visual change and weakness. Pertinent negatives include no fatigue.  Diabetes He presents for his follow-up diabetic visit. He has type 2 diabetes mellitus. His disease course has been fluctuating. Hypoglycemia symptoms include dizziness. Associated symptoms include blurred vision, foot paresthesias, visual change and weakness. Pertinent negatives for diabetes include no fatigue, no foot ulcerations and no polyphagia. Pertinent negatives for hypoglycemia complications include no blackouts and no hospitalization. Symptoms are worsening. Diabetic complications include a CVA and peripheral neuropathy. Risk factors for coronary artery disease include male sex, obesity, dyslipidemia, sedentary lifestyle, stress and tobacco exposure. Current  diabetic treatment includes insulin injections and oral agent (dual therapy). He is following a generally unhealthy diet. His breakfast blood glucose range is generally >200 mg/dl. An ACE inhibitor/angiotensin II receptor blocker is being taken. Eye exam is current.      Review of Systems  Constitutional: Negative for fatigue.  Eyes: Positive for blurred vision.  Gastrointestinal: Negative for bowel incontinence.  Endocrine: Negative for polyphagia.  Genitourinary: Negative for bladder incontinence and dysuria.  Musculoskeletal: Positive for back pain.  Neurological: Positive for dizziness, tingling, weakness and numbness.  All other systems reviewed and are negative.      Objective:   Physical Exam  Constitutional: He is oriented to person, place, and time. He appears well-developed and well-nourished. No distress.  HENT:  Head: Normocephalic.  Eyes: Pupils are equal, round, and reactive to light. Right eye exhibits no discharge. Left eye exhibits no discharge.  Neck: Normal range of motion. Neck supple. No thyromegaly present.  Cardiovascular: Normal rate, regular rhythm, normal heart sounds and intact distal pulses.   No murmur heard. Pulmonary/Chest: Effort normal and breath sounds normal. No respiratory distress. He has no wheezes.  Abdominal: Soft. Bowel sounds are normal. He exhibits no distension. There is no tenderness.  Musculoskeletal: Normal range of motion. He exhibits no edema or tenderness.  Positive straight leg raise   Neurological: He is alert and oriented to person, place, and time.  Skin: Skin is warm and dry. No rash noted. No erythema.  Psychiatric: He has a normal mood and affect. His behavior is normal. Judgment and thought content normal.  Vitals reviewed.     BP 97/64 mmHg  Pulse 108  Temp(Src) 98.3 F (36.8  C) (Oral)  Ht 5\' 7"  (1.702 m)  Wt 230 lb (104.327 kg)  BMI 36.01 kg/m2     Assessment & Plan:  1. Obesity (BMI 30-39.9)  2. Type 2  diabetes mellitus treated with insulin (HCC) - Microalbumin / creatinine urine ratio  3. Postlaminectomy syndrome, lumbar region - Ambulatory referral to Orthopedic Surgery - ketorolac (TORADOL) injection 60 mg; Inject 2 mLs (60 mg total) into the muscle once.  4. Right-sided low back pain with right-sided sciatica - Ambulatory referral to Orthopedic Surgery - ketorolac (TORADOL) injection 60 mg; Inject 2 mLs (60 mg total) into the muscle once.   Continue Norco Pt had app with clinical pharm today- Blood sugar was 121 today!!! This is a very big accomplishment for patient. Discussed importance of low carb diet and taking medications every day!!!    Charles Rodneyhristy Lou Irigoyen, FNP

## 2015-06-26 NOTE — Patient Instructions (Signed)
Diabetes and Standards of Medical Care   Diabetes is complicated. You may find that your diabetes team includes a dietitian, nurse, diabetes educator, eye doctor, and more. To help everyone know what is going on and to help you get the care you deserve, the following schedule of care was developed to help keep you on track. Below are the tests, exams, vaccines, medicines, education, and plans you will need.  Blood Glucose Goals Prior to meals = 80 - 130 Within 2 hours of the start of a meal = less than 180  HbA1c test (goal is less than 7.0% - your last value was %) This test shows how well you have controlled your glucose over the past 2 to 3 months. It is used to see if your diabetes management plan needs to be adjusted.   It is performed at least 2 times a year if you are meeting treatment goals.  It is performed 4 times a year if therapy has changed or if you are not meeting treatment goals.  Blood pressure test  This test is performed at every routine medical visit. The goal is less than 140/90 mmHg for most people, but 130/80 mmHg in some cases. Ask your health care provider about your goal.  Dental exam  Follow up with the dentist regularly.  Eye exam  If you are diagnosed with type 1 diabetes as a child, get an exam upon reaching the age of 10 years or older and have had diabetes for 3 to 5 years. Yearly eye exams are recommended after that initial eye exam.  If you are diagnosed with type 1 diabetes as an adult, get an exam within 5 years of diagnosis and then yearly.  If you are diagnosed with type 2 diabetes, get an exam as soon as possible after the diagnosis and then yearly.  Foot care exam  Visual foot exams are performed at every routine medical visit. The exams check for cuts, injuries, or other problems with the feet.  A comprehensive foot exam should be done yearly. This includes visual inspection as well as assessing foot pulses and testing for loss of  sensation.  Check your feet nightly for cuts, injuries, or other problems with your feet. Tell your health care provider if anything is not healing.  Kidney function test (urine microalbumin)  This test is performed once a year.  Type 1 diabetes: The first test is performed 5 years after diagnosis.  Type 2 diabetes: The first test is performed at the time of diagnosis.  A serum creatinine and estimated glomerular filtration rate (eGFR) test is done once a year to assess the level of chronic kidney disease (CKD), if present.  Lipid profile (cholesterol, HDL, LDL, triglycerides)  Performed every 5 years for most people.  The goal for LDL is less than 100 mg/dL. If you are at high risk, the goal is less than 70 mg/dL.  The goal for HDL is 40 mg/dL to 50 mg/dL for men and 50 mg/dL to 60 mg/dL for women. An HDL cholesterol of 60 mg/dL or higher gives some protection against heart disease.  The goal for triglycerides is less than 150 mg/dL.  Influenza vaccine, pneumococcal vaccine, and hepatitis B vaccine  The influenza vaccine is recommended yearly.  The pneumococcal vaccine is generally given once in a lifetime. However, there are some instances when another vaccination is recommended. Check with your health care provider.  The hepatitis B vaccine is also recommended for adults with diabetes.    Diabetes self-management education  Education is recommended at diagnosis and ongoing as needed.  Treatment plan  Your treatment plan is reviewed at every medical visit.  Document Released: 11/29/2008 Document Revised: 10/04/2012 Document Reviewed: 07/04/2012 [employer] Patient Information 2014 [employer].   

## 2015-06-26 NOTE — Patient Instructions (Signed)

## 2015-06-27 LAB — MICROALBUMIN / CREATININE URINE RATIO
Creatinine, Urine: 28.4 mg/dL
MICROALB/CREAT RATIO: 16.5 mg/g creat (ref 0.0–30.0)
Microalbumin, Urine: 4.7 ug/mL

## 2015-07-03 ENCOUNTER — Telehealth: Payer: Self-pay | Admitting: Family

## 2015-07-05 ENCOUNTER — Other Ambulatory Visit: Payer: Self-pay | Admitting: Family

## 2015-07-05 DIAGNOSIS — S335XXD Sprain of ligaments of lumbar spine, subsequent encounter: Secondary | ICD-10-CM | POA: Diagnosis not present

## 2015-07-05 DIAGNOSIS — M4806 Spinal stenosis, lumbar region: Secondary | ICD-10-CM | POA: Diagnosis not present

## 2015-07-07 NOTE — Telephone Encounter (Signed)
Last seen 06/26/15  Neysa BonitoChristy   If approved route [facility]

## 2015-07-16 DIAGNOSIS — S335XXD Sprain of ligaments of lumbar spine, subsequent encounter: Secondary | ICD-10-CM | POA: Diagnosis not present

## 2015-07-17 ENCOUNTER — Ambulatory Visit: Payer: Self-pay | Admitting: Pharmacist

## 2015-07-17 ENCOUNTER — Other Ambulatory Visit: Payer: Self-pay | Admitting: *Deleted

## 2015-07-17 DIAGNOSIS — G8929 Other chronic pain: Secondary | ICD-10-CM

## 2015-07-17 DIAGNOSIS — M549 Dorsalgia, unspecified: Principal | ICD-10-CM
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
## 2015-07-18 ENCOUNTER — Telehealth: Payer: Self-pay | Admitting: Family

## 2015-07-18 ENCOUNTER — Other Ambulatory Visit: Payer: Self-pay | Admitting: Family

## 2015-07-18 DIAGNOSIS — M549 Dorsalgia, unspecified: Principal | ICD-10-CM

## 2015-07-18 DIAGNOSIS — G8929 Other chronic pain: Secondary | ICD-10-CM

## 2015-07-18 MED ORDER — HYDROCODONE-ACETAMINOPHEN 10-325 MG PO TABS: 1.0000 | ORAL_TABLET | Freq: Two times a day (BID) | ORAL | Status: DC | PRN

## 2015-07-18 NOTE — Telephone Encounter (Signed)
Appointment scheduled for 07/23/15

## 2015-07-18 NOTE — Telephone Encounter (Signed)
PT needs pain contract appt, will not give any more refills

## 2015-07-22 ENCOUNTER — Ambulatory Visit: Payer: Medicare Other | Admitting: Pharmacist

## 2015-07-23 ENCOUNTER — Encounter: Payer: Self-pay | Admitting: Family

## 2015-07-23 ENCOUNTER — Ambulatory Visit (INDEPENDENT_AMBULATORY_CARE_PROVIDER_SITE_OTHER): Payer: Medicare Other | Admitting: Family

## 2015-07-23 VITALS — BP 107/77 | HR 98 | Temp 97.1°F | Ht 67.0 in | Wt 237.2 lb

## 2015-07-23 DIAGNOSIS — F1120 Opioid dependence, uncomplicated: Secondary | ICD-10-CM

## 2015-07-23 DIAGNOSIS — I6350 Cerebral infarction due to unspecified occlusion or stenosis of unspecified cerebral artery: Secondary | ICD-10-CM | POA: Diagnosis not present

## 2015-07-23 DIAGNOSIS — M549 Dorsalgia, unspecified: Secondary | ICD-10-CM

## 2015-07-23 DIAGNOSIS — G8929 Other chronic pain: Secondary | ICD-10-CM

## 2015-07-23 DIAGNOSIS — Z79899 Other long term (current) drug therapy: Secondary | ICD-10-CM | POA: Diagnosis not present

## 2015-07-23 DIAGNOSIS — Z0289 Encounter for other administrative examinations: Secondary | ICD-10-CM

## 2015-07-23 MED ORDER — HYDROCODONE-ACETAMINOPHEN 10-325 MG PO TABS: 1.0000 | ORAL_TABLET | Freq: Three times a day (TID) | ORAL | Status: DC

## 2015-07-23 MED ORDER — ALPRAZOLAM 0.5 MG PO TABS: 0.5000 mg | ORAL_TABLET | Freq: Two times a day (BID) | ORAL | Status: DC | PRN

## 2015-07-23 MED ORDER — ZOLPIDEM TARTRATE 10 MG PO TABS: 10.0000 mg | ORAL_TABLET | Freq: Every evening | ORAL | Status: DC | PRN

## 2015-07-23 NOTE — Patient Instructions (Signed)

## 2015-07-23 NOTE — Progress Notes (Signed)
Subjective:    Patient ID: Charles Pennington, male    DOB: 1965-11-16, 50 y.o.   MRN: [id number]  HPI [facility] Controlled Substance Abuse database reviewed- Yes  Depression screen [facility] 2/9 07/23/2015 03/17/2015 01/15/2015 12/05/2014 10/04/2014  Decreased Interest 1 0 0 0 0  Down, Depressed, Hopeless 0 0 0 0 0  PHQ - 2 Score 1 0 0 0 0    GAD 7 : Generalized Anxiety Score 07/23/2015  Nervous, Anxious, on Edge 0  Control/stop worrying 0  Worry too much - different things 0  Trouble relaxing 0  Restless 0  Easily annoyed or irritable 0  Afraid - awful might happen 0  Total GAD 7 Score 0       [employer] drug screen performed- Yes  SOAPP  0= never  1= seldom  2=sometimes  3= often  4= very often  How often do you have mood swings? 0 How often do you smoke a cigarette within an hour after waling up? 4 How often have you taken medication other than the way that it was prescribed?0 How often have you used illegal drugs in the past 5 years? 1 How often, in your lifetime, have you had legal problems or been arrested? 2  Score 7  Alcohol Audit - How often during the last year have found that you: 0-Never   1- Less than monthly   2- Monthly     3-Weekly     4-daily or almost daily  - found that you were not able to stop drinking once you started- 0 -failed to do what was normally expected of you because of drinking- 0 -needed a first drink in the morning- 0 -had a feeling of guilt or remorse after drinking- 0 -are/were unable to remember what happened the night before because of your drinking- 0  0- NO   2- yes but not in last year  4- yes during last year -Have you or someone else been injured because of your drinking- 0 - Has anyone been concerned about your drinking or suggested you cut down- 0        TOTAL- 0  ( 0-7- alcohol education, 8-15- simple advice, 16-19 simple advice plus counseling, 20-40 referral for evaluation and treatment 0   Designated Pharm[facility]- [facility]  [place]  Pain assessment: Pain location- Lower back Pain on scale of 1-10- 10 Frequency- constant What increases pain-walking or standing What makes pain Better-rest and pain medication  Pain management agreement reviewed and signed- Yes     Review of Systems  Musculoskeletal: Positive for back pain.  All other systems reviewed and are negative.      Objective:   Physical Exam  Constitutional: He is oriented to person, place, and time. He appears well-developed and well-nourished. No distress.  HENT:  Head: Normocephalic.  Eyes: Pupils are equal, round, and reactive to light. Right eye exhibits no discharge. Left eye exhibits no discharge.  Neck: Normal range of motion. Neck supple. No thyromegaly present.  Cardiovascular: Normal rate, regular rhythm, normal heart sounds and intact distal pulses.   No murmur heard. Pulmonary/Chest: Effort normal. No respiratory distress. He has wheezes.  Abdominal: Soft. Bowel sounds are normal. He exhibits no distension. There is no tenderness.  Musculoskeletal: Normal range of motion. He exhibits no edema or tenderness.  Neurological: He is alert and oriented to person, place, and time.  Skin: Skin is warm and dry. No rash noted. No erythema.  Psychiatric: He has a normal  mood and affect. His behavior is normal. Judgment and thought content normal.  Vitals reviewed.  BP 107/77 mmHg  Pulse 98  Temp(Src) 97.1 F (36.2 C) (Oral)  Ht 5\' 7"  (1.702 m)  Wt 237 lb 3.2 oz (107.593 kg)  BMI 37.14 kg/m2        Assessment & Plan:  1. Chronic back pain - HYDROcodone-acetaminophen (NORCO) 10-325 MG tablet; Take 1 tablet by mouth every 8 (eight) hours.  Dispense: 90 tablet; Refill: 0 - HYDROcodone-acetaminophen (NORCO) 10-325 MG tablet; Take 1 tablet by mouth every 8 (eight) hours.  Dispense: 90 tablet; Refill: 0 - HYDROcodone-acetaminophen (NORCO) 10-325 MG tablet; Take 1 tablet by mouth every 8 (eight) hours.  Dispense: 90 tablet; Refill:  0 - ToxASSURE Select 13 (MW), Urine  2. Pain medication agreement signed - HYDROcodone-acetaminophen (NORCO) 10-325 MG tablet; Take 1 tablet by mouth every 8 (eight) hours.  Dispense: 90 tablet; Refill: 0 - HYDROcodone-acetaminophen (NORCO) 10-325 MG tablet; Take 1 tablet by mouth every 8 (eight) hours.  Dispense: 90 tablet; Refill: 0 - HYDROcodone-acetaminophen (NORCO) 10-325 MG tablet; Take 1 tablet by mouth every 8 (eight) hours.  Dispense: 90 tablet; Refill: 0 - ToxASSURE Select 13 (MW), Urine  3. Uncomplicated opioid dependence (HCC) - HYDROcodone-acetaminophen (NORCO) 10-325 MG tablet; Take 1 tablet by mouth every 8 (eight) hours.  Dispense: 90 tablet; Refill: 0 - HYDROcodone-acetaminophen (NORCO) 10-325 MG tablet; Take 1 tablet by mouth every 8 (eight) hours.  Dispense: 90 tablet; Refill: 0 - HYDROcodone-acetaminophen (NORCO) 10-325 MG tablet; Take 1 tablet by mouth every 8 (eight) hours.  Dispense: 90 tablet; Refill: 0 - ToxASSURE Select 13 (MW), Urine   Continue all meds Labs pending Health Maintenance reviewed Diet and exercise encouraged RTO 3 months  Jannifer Rodneyhristy Thera Basden, FNP

## 2015-07-28 ENCOUNTER — Other Ambulatory Visit: Payer: Self-pay | Admitting: Family

## 2015-07-30 LAB — TOXASSURE SELECT 13 (MW), URINE: PDF: 0

## 2015-08-05 ENCOUNTER — Other Ambulatory Visit: Payer: Self-pay | Admitting: Family

## 2015-08-05 ENCOUNTER — Telehealth: Payer: Self-pay | Admitting: Family

## 2015-08-05 DIAGNOSIS — K219 Gastro-esophageal reflux disease without esophagitis: Secondary | ICD-10-CM

## 2015-08-05 DIAGNOSIS — F32A Depression, unspecified: Secondary | ICD-10-CM

## 2015-08-05 DIAGNOSIS — F411 Generalized anxiety disorder: Secondary | ICD-10-CM

## 2015-08-05 DIAGNOSIS — F329 Major depressive disorder, single episode, unspecified: Secondary | ICD-10-CM

## 2015-08-06 MED ORDER — PAROXETINE HCL 40 MG PO TABS: 40.0000 mg | ORAL_TABLET | Freq: Every day | ORAL | Status: DC

## 2015-08-06 MED ORDER — OMEPRAZOLE 20 MG PO CPDR: DELAYED_RELEASE_CAPSULE | ORAL | Status: DC

## 2015-08-06 NOTE — Telephone Encounter (Signed)
Patient states [facility]  [facility]

## 2015-08-08 ENCOUNTER — Other Ambulatory Visit: Payer: Self-pay | Admitting: Orthopedic Surgery

## 2015-08-08 DIAGNOSIS — M545 Low back pain: Secondary | ICD-10-CM

## 2015-08-09 ENCOUNTER — Other Ambulatory Visit: Payer: Medicare Other

## 2015-08-17 ENCOUNTER — Other Ambulatory Visit: Payer: Medicare Other

## 2015-08-20 ENCOUNTER — Ambulatory Visit
Admission: RE | Admit: 2015-08-20 | Discharge: 2015-08-20 | Disposition: A | Discharge status: Home / Self Care | Payer: Medicare Other | Source: Ambulatory Visit | Attending: Orthopedic Surgery | Admitting: Orthopedic Surgery

## 2015-08-20 ENCOUNTER — Telehealth: Payer: Self-pay | Admitting: Family

## 2015-08-20 DIAGNOSIS — M545 Low back pain: Secondary | ICD-10-CM | POA: Diagnosis not present

## 2015-08-20 MED ORDER — GADOBENATE DIMEGLUMINE 529 MG/ML IV SOLN: 20.0000 mL | Freq: Once | INTRAVENOUS | Status: DC | PRN

## 2015-08-21 NOTE — Telephone Encounter (Signed)
Forms were for him to fill out, not us. He picked them up

## 2015-08-22 ENCOUNTER — Other Ambulatory Visit: Payer: Self-pay | Admitting: Family

## 2015-08-28 ENCOUNTER — Other Ambulatory Visit: Payer: Self-pay | Admitting: Family

## 2015-08-28 NOTE — Telephone Encounter (Signed)
Not on med list? Route to pools

## 2015-09-02 DIAGNOSIS — S335XXD Sprain of ligaments of lumbar spine, subsequent encounter: Secondary | ICD-10-CM | POA: Diagnosis not present

## 2015-09-08 ENCOUNTER — Other Ambulatory Visit: Payer: Self-pay

## 2015-09-08 DIAGNOSIS — E785 Hyperlipidemia, unspecified: Secondary | ICD-10-CM

## 2015-09-08 MED ORDER — ATORVASTATIN CALCIUM 80 MG PO TABS: 80.0000 mg | ORAL_TABLET | Freq: Every day | ORAL | 0 refills | Status: DC

## 2015-09-16 DIAGNOSIS — L03031 Cellulitis of right toe: Secondary | ICD-10-CM | POA: Diagnosis not present

## 2015-09-20 ENCOUNTER — Other Ambulatory Visit: Payer: Self-pay | Admitting: Pharmacist

## 2015-09-30 DIAGNOSIS — M79676 Pain in unspecified toe(s): Secondary | ICD-10-CM | POA: Diagnosis not present

## 2015-09-30 DIAGNOSIS — L03032 Cellulitis of left toe: Secondary | ICD-10-CM | POA: Diagnosis not present

## 2015-10-01 DIAGNOSIS — M5416 Radiculopathy, lumbar region: Secondary | ICD-10-CM | POA: Diagnosis not present

## 2015-10-14 ENCOUNTER — Telehealth: Payer: Self-pay | Admitting: Family

## 2015-10-14 DIAGNOSIS — L03031 Cellulitis of right toe: Secondary | ICD-10-CM | POA: Diagnosis not present

## 2015-10-14 DIAGNOSIS — G5790 Unspecified mononeuropathy of unspecified lower limb: Secondary | ICD-10-CM | POA: Diagnosis not present

## 2015-10-21 NOTE — Telephone Encounter (Signed)
ok 

## 2015-10-24 ENCOUNTER — Other Ambulatory Visit: Payer: Self-pay | Admitting: Pharmacist

## 2015-10-24 DIAGNOSIS — M5136 Other intervertebral disc degeneration, lumbar region: Secondary | ICD-10-CM | POA: Diagnosis not present

## 2015-10-24 DIAGNOSIS — S335XXD Sprain of ligaments of lumbar spine, subsequent encounter: Secondary | ICD-10-CM | POA: Diagnosis not present

## 2015-10-28 ENCOUNTER — Encounter: Payer: Self-pay | Admitting: Family

## 2015-10-28 ENCOUNTER — Ambulatory Visit (INDEPENDENT_AMBULATORY_CARE_PROVIDER_SITE_OTHER): Payer: Medicare Other | Admitting: Family

## 2015-10-28 VITALS — BP 114/81 | HR 94 | Temp 97.9°F | Ht 67.0 in | Wt 235.0 lb

## 2015-10-28 DIAGNOSIS — F17200 Nicotine dependence, unspecified, uncomplicated: Secondary | ICD-10-CM

## 2015-10-28 DIAGNOSIS — J4520 Mild intermittent asthma, uncomplicated: Secondary | ICD-10-CM

## 2015-10-28 DIAGNOSIS — Z794 Long term (current) use of insulin: Secondary | ICD-10-CM

## 2015-10-28 DIAGNOSIS — E1142 Type 2 diabetes mellitus with diabetic polyneuropathy: Secondary | ICD-10-CM | POA: Diagnosis not present

## 2015-10-28 DIAGNOSIS — E119 Type 2 diabetes mellitus without complications: Secondary | ICD-10-CM | POA: Diagnosis not present

## 2015-10-28 DIAGNOSIS — K219 Gastro-esophageal reflux disease without esophagitis: Secondary | ICD-10-CM

## 2015-10-28 DIAGNOSIS — I69993 Ataxia following unspecified cerebrovascular disease: Secondary | ICD-10-CM

## 2015-10-28 DIAGNOSIS — Z0289 Encounter for other administrative examinations: Secondary | ICD-10-CM

## 2015-10-28 DIAGNOSIS — I10 Essential (primary) hypertension: Secondary | ICD-10-CM

## 2015-10-28 DIAGNOSIS — Z79899 Other long term (current) drug therapy: Secondary | ICD-10-CM

## 2015-10-28 DIAGNOSIS — E785 Hyperlipidemia, unspecified: Secondary | ICD-10-CM

## 2015-10-28 DIAGNOSIS — G8929 Other chronic pain: Secondary | ICD-10-CM

## 2015-10-28 DIAGNOSIS — M549 Dorsalgia, unspecified: Secondary | ICD-10-CM

## 2015-10-28 DIAGNOSIS — F32A Depression, unspecified: Secondary | ICD-10-CM

## 2015-10-28 DIAGNOSIS — F1120 Opioid dependence, uncomplicated: Secondary | ICD-10-CM

## 2015-10-28 DIAGNOSIS — G4700 Insomnia, unspecified: Secondary | ICD-10-CM

## 2015-10-28 DIAGNOSIS — F411 Generalized anxiety disorder: Secondary | ICD-10-CM

## 2015-10-28 DIAGNOSIS — F329 Major depressive disorder, single episode, unspecified: Secondary | ICD-10-CM

## 2015-10-28 DIAGNOSIS — E039 Hypothyroidism, unspecified: Secondary | ICD-10-CM

## 2015-10-28 LAB — CMP14+EGFR
A/G RATIO: 1.8 (ref 1.2–2.2)
ALBUMIN: 4.3 g/dL (ref 3.5–5.5)
ALK PHOS: 57 IU/L (ref 39–117)
ALT: 14 IU/L (ref 0–44)
AST: 15 IU/L (ref 0–40)
BUN / CREAT RATIO: 21 — AB (ref 9–20)
BUN: 24 mg/dL (ref 6–24)
Bilirubin Total: <0.2 mg/dL (ref 0.0–1.2)
CO2: 24 mmol/L (ref 18–29)
Calcium: 9.7 mg/dL (ref 8.7–10.2)
Chloride: 96 mmol/L (ref 96–106)
Creatinine, Ser: 1.15 mg/dL (ref 0.76–1.27)
GFR calc Af Amer: 86 mL/min/{1.73_m2} (ref >59)
GFR calc non Af Amer: 74 mL/min/{1.73_m2} (ref >59)
GLOBULIN, TOTAL: 2.4 g/dL (ref 1.5–4.5)
Glucose: 351 mg/dL — ABNORMAL HIGH (ref 65–99)
POTASSIUM: 4.6 mmol/L (ref 3.5–5.2)
SODIUM: 134 mmol/L (ref 134–144)
Total Protein: 6.7 g/dL (ref 6.0–8.5)

## 2015-10-28 LAB — LIPID PANEL
CHOL/HDL RATIO: 3.1 ratio (ref 0.0–5.0)
CHOLESTEROL TOTAL: 116 mg/dL (ref 100–199)
HDL: 38 mg/dL — ABNORMAL LOW (ref >39)
LDL Calculated: 47 mg/dL (ref 0–99)
TRIGLYCERIDES: 153 mg/dL — AB (ref 0–149)
VLDL Cholesterol Cal: 31 mg/dL (ref 5–40)

## 2015-10-28 LAB — BAYER DCA HB A1C WAIVED: HB A1C (BAYER DCA - WAIVED): 7.7 % — ABNORMAL HIGH (ref <7.0)

## 2015-10-28 MED ORDER — HYDROCODONE-ACETAMINOPHEN 10-325 MG PO TABS: 1.0000 | ORAL_TABLET | Freq: Three times a day (TID) | ORAL | 0 refills | Status: DC

## 2015-10-28 NOTE — Patient Instructions (Signed)
Make appt for surgery clarence

## 2015-10-28 NOTE — Progress Notes (Signed)
Subjective:    Patient ID: Charles Pennington, male    DOB: 1965/05/08, 50 y.o.   MRN: [id number]  Pt presents to the office today for chronic follow up. PT states he is scheduled for back fusion surgery this month. Pt had a CVA when he turned 31 years. PT states he continue to have "balance issues related to the CVA". PT states this is stable.   Medication Refill  Associated symptoms include a visual change. Pertinent negatives include no coughing, fatigue, headaches, myalgias, nausea or sore throat.  Diabetes  He presents for his follow-up diabetic visit. He has type 2 diabetes mellitus. His disease course has been fluctuating. Hypoglycemia symptoms include nervousness/anxiousness. Pertinent negatives for hypoglycemia include no confusion, dizziness, headaches or sleepiness. Associated symptoms include foot paresthesias and visual change. Pertinent negatives for diabetes include no blurred vision, no fatigue and no foot ulcerations. There are no hypoglycemic complications. Symptoms are stable. Diabetic complications include a CVA and peripheral neuropathy. Pertinent negatives for diabetic complications include no heart disease or nephropathy. Risk factors for coronary artery disease include diabetes mellitus, dyslipidemia, hypertension, male sex and tobacco exposure. Current diabetic treatment includes insulin injections, oral agent (monotherapy) and oral agent (triple therapy). He is compliant with treatment all of the time. His weight is stable. He is following a generally healthy diet. He participates in exercise three times a week. His breakfast blood glucose range is generally 140-180 mg/dl. An ACE inhibitor/angiotensin II receptor blocker is being taken. Eye exam is current.  Hypertension  This is a chronic problem. The current episode started more than 1 year ago. The problem has been resolved since onset. The problem is controlled. Associated symptoms include anxiety. Pertinent negatives  include no blurred vision, headaches, malaise/fatigue, palpitations, peripheral edema or shortness of breath. Risk factors for coronary artery disease include diabetes mellitus, dyslipidemia, male gender, obesity and smoking/tobacco exposure. Past treatments include calcium channel blockers. The current treatment provides mild improvement. Hypertensive end-organ damage includes CVA and a thyroid problem. There is no history of kidney disease, CAD/MI or heart failure. There is no history of sleep apnea.  Hyperlipidemia  This is a chronic problem. The current episode started more than 1 year ago. The problem is uncontrolled. Recent lipid tests were reviewed and are high. Exacerbating diseases include diabetes, hypothyroidism and obesity. Factors aggravating his hyperlipidemia include smoking. Pertinent negatives include no leg pain, myalgias or shortness of breath. Current antihyperlipidemic treatment includes statins. The current treatment provides significant improvement of lipids. Risk factors for coronary artery disease include diabetes mellitus, dyslipidemia, hypertension, male sex and obesity.  Thyroid Problem  Presents for follow-up visit. Symptoms include anxiety, depressed mood, hoarse voice and visual change. Patient reports no constipation, diarrhea, dry skin, fatigue, leg swelling, nail problem or palpitations. The symptoms have been stable. Past treatments include levothyroxine. The treatment provided significant relief. His past medical history is significant for diabetes and hyperlipidemia. There is no history of heart failure.  Anxiety  Presents for follow-up visit. Symptoms include depressed mood, irritability and nervous/anxious behavior. Patient reports no confusion, dizziness, excessive worry, insomnia, nausea, palpitations or shortness of breath. Symptoms occur rarely. The quality of sleep is good. Nighttime awakenings: none.   His past medical history is significant for anxiety/panic  attacks, asthma and depression. There is no history of arrhythmia, CAD, CHF or fibromyalgia. Past treatments include SSRIs and benzodiazephines.  Gastroesophageal Reflux  He complains of a hoarse voice and wheezing. He reports no belching, no coughing, no heartburn, no nausea,  no sore throat or no tooth decay. This is a chronic problem. The current episode started more than 1 year ago. The problem occurs rarely. The problem has been resolved. Pertinent negatives include no fatigue. Risk factors include caffeine use. He has tried a PPI for the symptoms. The treatment provided significant relief.  Back Pain  This is a chronic problem. The current episode started more than 1 year ago. The problem occurs constantly. The problem has been waxing and waning since onset. The pain is present in the lumbar spine. The quality of the pain is described as aching. The pain is at a severity of 9/10. The pain is moderate. The symptoms are aggravated by bending. Pertinent negatives include no headaches or leg pain. Risk factors include obesity. He has tried analgesics and ice for the symptoms. The treatment provided moderate relief.  Asthma  He complains of hoarse voice and wheezing. There is no cough or shortness of breath. This is a chronic problem. The current episode started more than 1 year ago. The problem has been waxing and waning. Pertinent negatives include no headaches, heartburn, malaise/fatigue, myalgias or sore throat. His symptoms are alleviated by rest and beta-agonist. He reports minimal improvement on treatment. Risk factors for lung disease include smoking/tobacco exposure. His past medical history is significant for asthma.  Depression       The patient presents with depression.  This is a chronic problem.  The current episode started more than 1 year ago.   The onset quality is gradual.   The problem occurs intermittently.  The problem has been waxing and waning since onset.  Associated symptoms include  irritable and decreased interest.  Associated symptoms include no fatigue, does not have insomnia, no myalgias, no headaches and not sad.  Past treatments include SSRIs - Selective serotonin reuptake inhibitors.  Compliance with treatment is good.  Past medical history includes hypothyroidism, thyroid problem, anxiety and depression.   Insomnia  Primary symptoms: difficulty falling asleep, frequent awakening, no malaise/fatigue.  The current episode started more than one year. The onset quality is gradual. The problem occurs intermittently. The problem has been waxing and waning since onset. The symptoms are aggravated by tobacco. Past treatments include medication. The treatment provided moderate relief. PMH includes: depression.  Diabetic Neuropathy Pt currently taking lyrica and states this helping with burning in bilateral feet.     Review of Systems  Constitutional: Positive for irritability. Negative for fatigue and malaise/fatigue.  HENT: Positive for hoarse voice. Negative for sore throat.   Eyes: Negative for blurred vision.  Respiratory: Positive for wheezing. Negative for cough and shortness of breath.   Cardiovascular: Negative.  Negative for palpitations.  Gastrointestinal: Negative.  Negative for constipation, diarrhea, heartburn and nausea.  Endocrine: Negative.   Genitourinary: Negative.   Musculoskeletal: Positive for back pain. Negative for myalgias.  Neurological: Negative.  Negative for dizziness and headaches.  Hematological: Negative.   Psychiatric/Behavioral: Positive for depression. Negative for confusion. The patient is nervous/anxious. The patient does not have insomnia.   All other systems reviewed and are negative.      Objective:   Physical Exam  Constitutional: He is oriented to person, place, and time. He appears well-developed and well-nourished. [name]. No distress.  HENT:  Head: Normocephalic.  Right Ear: External ear normal.  Left Ear:  External ear normal.  Nose: Nose normal.  Mouth/Throat: Oropharynx is clear and moist.  Eyes: Pupils are equal, round, and reactive to light. Right eye exhibits no discharge.  Left eye exhibits no discharge.  Neck: Normal range of motion. Neck supple. No thyromegaly present.  Cardiovascular: Normal rate, regular rhythm, normal heart sounds and intact distal pulses.   No murmur heard. Pulmonary/Chest: Effort normal and breath sounds normal. No respiratory distress. He has no wheezes.  Abdominal: Soft. Bowel sounds are normal. He exhibits no distension. There is no tenderness.  Musculoskeletal: Normal range of motion. He exhibits no edema or tenderness.  Neurological: He is alert and oriented to person, place, and time. He has normal reflexes. No cranial nerve deficit.  Skin: Skin is warm and dry. No rash noted. No erythema.  Psychiatric: He has a normal mood and affect. His behavior is normal. Judgment and thought content normal.  Vitals reviewed.     BP 114/81   Pulse 94   Temp 97.9 F (36.6 C) (Oral)   Ht 5' 7"  (1.702 m)   Wt 235 lb (106.6 kg)   BMI 36.81 kg/m      Assessment & Plan:  1. Essential hypertension - CMP14+EGFR  2. Asthma, mild intermittent, uncomplicated - FGB02+XJDB  3. Gastroesophageal reflux disease, esophagitis presence not specified - CMP14+EGFR  4. Diabetic polyneuropathy associated with type 2 diabetes mellitus (HCC) - CMP14+EGFR  5. Type 2 diabetes mellitus treated with insulin (HCC) - Bayer DCA Hb A1c Waived - CMP14+EGFR  6. Hypothyroidism, unspecified hypothyroidism type - CMP14+EGFR  7. Ataxia, late effect of cerebrovascular disease - CMP14+EGFR  8. SMOKER - CMP14+EGFR  9. Pain medication agreement signed - CMP14+EGFR - ToxASSURE Select 13 (MW), Urine - HYDROcodone-acetaminophen (NORCO) 10-325 MG tablet; Take 1 tablet by mouth every 8 (eight) hours.  Dispense: 90 tablet; Refill: 0  10. Uncomplicated opioid dependence ([place]) -  CMP14+EGFR - ToxASSURE Select 13 (MW), Urine - HYDROcodone-acetaminophen (NORCO) 10-325 MG tablet; Take 1 tablet by mouth every 8 (eight) hours.  Dispense: 90 tablet; Refill: 0  11. Morbid obesity, unspecified obesity type ([place]) - CMP14+EGFR  12. Hyperlipidemia - CMP14+EGFR - Lipid panel  13. GAD (generalized anxiety disorder) - CMP14+EGFR  14. Chronic back pain - CMP14+EGFR - ToxASSURE Select 13 (MW), Urine - HYDROcodone-acetaminophen (NORCO) 10-325 MG tablet; Take 1 tablet by mouth every 8 (eight) hours.  Dispense: 90 tablet; Refill: 0  15. Depression - CMP14+EGFR  16. Insomnia - CMP14+EGFR  Reviewed [facility] report- Yes, Dr. Rolena Infante has given patient medication related to his upcoming back surgery. PT call and notified office. Dr. Irving Shows, Podiatry, started patient on lyrica. Discussed with patient hat we will d/c gabapentin today. He voices understanding. Pt states he has taken medications accordingly, but did take "two puffs of marijuana" two weeks ago.      Continue all meds Labs pending Health Maintenance reviewed Diet and exercise encouraged RTO 3 and make appt for surgical clearance   Evelina Dun, FNP

## 2015-10-29 ENCOUNTER — Encounter: Payer: Self-pay | Admitting: Family Medicine

## 2015-10-29 ENCOUNTER — Ambulatory Visit (INDEPENDENT_AMBULATORY_CARE_PROVIDER_SITE_OTHER): Payer: Medicare Other

## 2015-10-29 ENCOUNTER — Ambulatory Visit (INDEPENDENT_AMBULATORY_CARE_PROVIDER_SITE_OTHER): Payer: Medicare Other | Admitting: Family Medicine

## 2015-10-29 VITALS — BP 121/85 | HR 92 | Temp 97.6°F | Ht 67.0 in | Wt 237.0 lb

## 2015-10-29 DIAGNOSIS — F1120 Opioid dependence, uncomplicated: Secondary | ICD-10-CM

## 2015-10-29 DIAGNOSIS — J4520 Mild intermittent asthma, uncomplicated: Secondary | ICD-10-CM | POA: Diagnosis not present

## 2015-10-29 DIAGNOSIS — Z794 Long term (current) use of insulin: Secondary | ICD-10-CM | POA: Diagnosis not present

## 2015-10-29 DIAGNOSIS — E119 Type 2 diabetes mellitus without complications: Secondary | ICD-10-CM

## 2015-10-29 DIAGNOSIS — E1142 Type 2 diabetes mellitus with diabetic polyneuropathy: Secondary | ICD-10-CM

## 2015-10-29 DIAGNOSIS — F17200 Nicotine dependence, unspecified, uncomplicated: Secondary | ICD-10-CM | POA: Diagnosis not present

## 2015-10-29 DIAGNOSIS — H6122 Impacted cerumen, left ear: Secondary | ICD-10-CM

## 2015-10-29 DIAGNOSIS — Z01818 Encounter for other preprocedural examination: Secondary | ICD-10-CM | POA: Diagnosis not present

## 2015-10-29 DIAGNOSIS — I10 Essential (primary) hypertension: Secondary | ICD-10-CM | POA: Diagnosis not present

## 2015-10-29 MED ORDER — FLUTICASONE FUROATE-VILANTEROL 100-25 MCG/INH IN AEPB: 1.0000 | INHALATION_SPRAY | Freq: Every day | RESPIRATORY_TRACT | Status: DC

## 2015-10-29 NOTE — Patient Instructions (Signed)
Earwax removal: ° °Debrox drops are available without a prescription at your pharmacy. ° °Lay on your side with the ear up that you want to treat. Place for 5 drops of the Debrox in the ear canal and lay still for 15 minutes. After that time you considered up and allow the excess to run out of the year and catch it with a Kleenex. Repeat this with the other ear if needed. ° °Repeat this process daily for 1 week. By that time the ear should feel less clogged and her hearing should be better, if not, follow up in the office for recheck of the ear. ° °Thanks, °Kendarious Gudino °

## 2015-10-29 NOTE — Progress Notes (Signed)
Subjective:  Patient ID: Charles Pennington, male    DOB: [birth year]  Age: 50 y.o. MRN: [id number]  CC: Pre-op Exam   HPI Charles Pennington presents for Patient is having 9 out of 10 pain in the lower back. He has planned surgery for either the fifth, 12th, or 18th of October. His MRI from July is noted below. He has facet arthropathy and spinal stenosis and bulging disc no herniation noted. That report is appended below with impression highlighted. He is in today for preop clearance. He states he needs a chest x-ray for that. He denies any current symptoms of chest pain shortness of breath or cough. However he does continue to smoke. He has some numbness in the right foot. He was changed from gabapentin to Lyrica through Dr. Irving Pennington, podiatry with reinforcement by Charles Pennington yesterday to not use both, discontinue the gabapentin. Of note is that he is a diabetic. His A1c yesterday was 7.7 down from greater than 14 a few months ago. He says that because his foot was hurting he doubled up on his diabetes medicine. Because of that he ran out and could not get it refilled. That is being managed by Charles Pennington as well.   History Maven has a past medical history of Anxiety; Asthma; Diabetes mellitus; Hypercholesteremia; Hypertension; Hypothyroidism; Low back pain; Obesity; SOB (shortness of breath); Stroke Charles Pennington) (age 36); and Vertigo.   He has a past surgical history that includes Wrist surgery; Spine surgery; and Foot surgery.   His family history includes Diabetes in his father and mother; Hypertension in his father; Suicidality in his father.He reports that he has been smoking Cigarettes.  He has been smoking about 0.50 packs per day. He has never used smokeless tobacco. He reports that he drinks alcohol. He reports that he does not use drugs.    ROS Review of Systems  Constitutional: Negative for chills, diaphoresis, fever and unexpected weight change.  HENT: Positive for ear pain (left ear  has feeling like there is water in it). Negative for congestion, hearing loss, rhinorrhea and sore throat.   Eyes: Negative for visual disturbance.  Respiratory: Negative for cough and shortness of breath.   Cardiovascular: Negative for chest pain.  Gastrointestinal: Negative for abdominal pain, constipation and diarrhea.  Genitourinary: Negative for dysuria and flank pain.  Musculoskeletal: Negative for arthralgias and joint swelling.  Skin: Negative for rash.  Neurological: Negative for dizziness and headaches.  Psychiatric/Behavioral: Negative for dysphoric mood and sleep disturbance.    Objective:  BP 121/85   Pulse 92   Temp 97.6 F (36.4 C) (Oral)   Ht _0  (1.702 m)   Wt 237 lb (107.5 kg)   BMI 37.12 kg/m   BP Readings from Last 3 Encounters:  10/29/15 121/85  10/28/15 114/81  07/23/15 107/77    Wt Readings from Last 3 Encounters:  10/29/15 237 lb (107.5 kg)  10/28/15 235 lb (106.6 kg)  07/23/15 237 lb 3.2 oz (107.6 kg)     Physical Exam   Lab Results  Component Value Date   WBC 10.2 02/04/2015   HGB 16.3 04/16/2014   HCT 45.1 02/04/2015   PLT 204 02/04/2015   GLUCOSE 351 (H) 10/28/2015   CHOL 116 10/28/2015   TRIG 153 (H) 10/28/2015   HDL 38 (L) 10/28/2015   LDLCALC 47 10/28/2015   ALT 14 10/28/2015   AST 15 10/28/2015   NA 134 10/28/2015   K 4.6 10/28/2015   CL 96 10/28/2015  CREATININE 1.15 10/28/2015   BUN 24 10/28/2015   CO2 24 10/28/2015   TSH 0.604 03/25/2015   PSA 0.1 03/18/2014   INR 0.99 03/26/2010   HGBA1C 9.2 02/21/2015   MICROALBUR 20 03/15/2014    Mr Lumbar Spine W Wo Contrast  Result Date: 08/20/2015 CLINICAL DATA:  Status post fall. Low back pain. Pain down the right leg. EXAM: MRI LUMBAR SPINE WITHOUT AND WITH CONTRAST TECHNIQUE: Multiplanar and multiecho pulse sequences of the lumbar spine were obtained without and with intravenous contrast. CONTRAST:  20 mL MultiHance COMPARISON:  CT lumbar spine 01/01/2011 FINDINGS:  Segmentation:  Standard. Alignment:  Minimal retrolisthesis of L4 on L5. Vertebrae:  No fracture, evidence of discitis, or bone lesion. Conus medullaris: Extends to the T12 level and appears normal. Paraspinal and other soft tissues: Postsurgical changes in the posterior paraspinal soft tissues at L3-4. Disc levels: Disc spaces: Posterior lumbar interbody fusion at L3-4. Disc desiccation at L4-5. T12-L1: No significant disc bulge. No evidence of neural foraminal stenosis. No central canal stenosis. L1-L2: No significant disc bulge. No evidence of neural foraminal stenosis. No central canal stenosis. L2-L3: No significant disc bulge. No evidence of neural foraminal stenosis. No central canal stenosis. L3-L4: Interbody fusion. No evidence of neural foraminal stenosis. No central canal stenosis. L4-L5: Broad-based disc bulge with a posterior annular fissure. Moderate bilateral facet arthropathy. Moderate spinal stenosis. Moderate bilateral foraminal stenosis. L5-S1: No significant disc bulge. No evidence of neural foraminal stenosis. No central canal stenosis. IMPRESSION: 1. At L4-5 there is a broad-based disc bulge with a posterior annular fissure. Moderate bilateral facet arthropathy. Moderate spinal stenosis. Moderate bilateral foraminal stenosis. 2. Posterior lumbar interbody fusion at L3-4 without recurrent foraminal or central canal stenosis. 3. No acute osseous injury of the lumbar spine. Electronically Signed   By: Kathreen Devoid   On: 08/20/2015 11:28    Assessment & Plan:   Charles Pennington was seen today for pre-op exam.  Diagnoses and all orders for this visit:  Cerumen impaction, left  Essential hypertension  Asthma, mild intermittent, uncomplicated -     DG Chest 2 View; Future -     EKG 12-Lead -     PR BREATHING CAPACITY TEST -     fluticasone furoate-vilanterol (BREO ELLIPTA) 100-25 MCG/INH 1 puff; Inhale 1 puff into the lungs daily.  Diabetic polyneuropathy associated with type 2 diabetes  mellitus (HCC)  Type 2 diabetes mellitus treated with insulin (HCC)  SMOKER -     fluticasone furoate-vilanterol (BREO ELLIPTA) 100-25 MCG/INH 1 puff; Inhale 1 puff into the lungs daily.  Uncomplicated opioid dependence ([place])  Moderately severe obstruction on PFT Elevated J point, no acute ischemia, essentially nml EKG CXR: NAD  Earwax removal:  Debrox drops are available without a prescription at your pharmacy.  Lay on your side with the ear up that you want to treat. Place for 5 drops of the Debrox in the ear canal and lay still for 15 minutes. After that time you considered up and allow the excess to run out of the year and catch it with a Kleenex. Repeat this with the other ear if needed.  Repeat this process daily for 1 week. By that time the ear should feel less clogged and her hearing should be better, if not, follow up in the office for recheck of the ear.   I am having Mr. Dombek maintain his BLOOD GLUCOSE TEST STRIPS, aspirin EC, Insulin Pen Needle, amLODipine, canagliflozin, fenofibrate, levothyroxine, lisinopril, Dulaglutide, blood glucose meter  kit and supplies, HYDROcodone-acetaminophen, HYDROcodone-acetaminophen, zolpidem, ALPRAZolam, PROAIR HFA, PARoxetine, omeprazole, atorvastatin, TRESIBA FLEXTOUCH, LYRICA, and HYDROcodone-acetaminophen. We will continue to administer fluticasone furoate-vilanterol.  Meds ordered this encounter  Medications  . fluticasone furoate-vilanterol (BREO ELLIPTA) 100-25 MCG/INH 1 puff     Follow-up: Return in about 3 months (around 01/28/2016) for Pain, diabetes with Christy.  Claretta Fraise, M.D.

## 2015-11-02 ENCOUNTER — Emergency Department (HOSPITAL_COMMUNITY)
Admission: EM | Admit: 2015-11-02 | Discharge: 2015-11-02 | Disposition: A | Discharge status: Home / Self Care | Payer: Medicare Other | Attending: Emergency Medicine | Admitting: Emergency Medicine

## 2015-11-02 ENCOUNTER — Emergency Department (HOSPITAL_COMMUNITY): Payer: Medicare Other

## 2015-11-02 ENCOUNTER — Encounter (HOSPITAL_COMMUNITY): Payer: Self-pay | Admitting: Emergency Medicine

## 2015-11-02 DIAGNOSIS — F17210 Nicotine dependence, cigarettes, uncomplicated: Secondary | ICD-10-CM | POA: Insufficient documentation

## 2015-11-02 DIAGNOSIS — Z7951 Long term (current) use of inhaled steroids: Secondary | ICD-10-CM | POA: Insufficient documentation

## 2015-11-02 DIAGNOSIS — R1031 Right lower quadrant pain: Secondary | ICD-10-CM | POA: Diagnosis not present

## 2015-11-02 DIAGNOSIS — J45909 Unspecified asthma, uncomplicated: Secondary | ICD-10-CM | POA: Insufficient documentation

## 2015-11-02 DIAGNOSIS — I10 Essential (primary) hypertension: Secondary | ICD-10-CM | POA: Insufficient documentation

## 2015-11-02 DIAGNOSIS — R1030 Lower abdominal pain, unspecified: Secondary | ICD-10-CM | POA: Diagnosis present

## 2015-11-02 DIAGNOSIS — Z79899 Other long term (current) drug therapy: Secondary | ICD-10-CM | POA: Diagnosis not present

## 2015-11-02 DIAGNOSIS — Z794 Long term (current) use of insulin: Secondary | ICD-10-CM | POA: Insufficient documentation

## 2015-11-02 DIAGNOSIS — K8020 Calculus of gallbladder without cholecystitis without obstruction: Secondary | ICD-10-CM | POA: Diagnosis not present

## 2015-11-02 DIAGNOSIS — Z79891 Long term (current) use of opiate analgesic: Secondary | ICD-10-CM | POA: Insufficient documentation

## 2015-11-02 DIAGNOSIS — K529 Noninfective gastroenteritis and colitis, unspecified: Secondary | ICD-10-CM | POA: Insufficient documentation

## 2015-11-02 DIAGNOSIS — Z7982 Long term (current) use of aspirin: Secondary | ICD-10-CM | POA: Diagnosis not present

## 2015-11-02 DIAGNOSIS — E119 Type 2 diabetes mellitus without complications: Secondary | ICD-10-CM | POA: Diagnosis not present

## 2015-11-02 DIAGNOSIS — E039 Hypothyroidism, unspecified: Secondary | ICD-10-CM | POA: Insufficient documentation

## 2015-11-02 LAB — COMPREHENSIVE METABOLIC PANEL
ALBUMIN: 3.5 g/dL (ref 3.5–5.0)
ALT: 20 U/L (ref 17–63)
AST: 24 U/L (ref 15–41)
Alkaline Phosphatase: 36 U/L — ABNORMAL LOW (ref 38–126)
Anion gap: 8 (ref 5–15)
BUN: 25 mg/dL — AB (ref 6–20)
CHLORIDE: 98 mmol/L — AB (ref 101–111)
CO2: 28 mmol/L (ref 22–32)
CREATININE: 1.28 mg/dL — AB (ref 0.61–1.24)
Calcium: 8.8 mg/dL — ABNORMAL LOW (ref 8.9–10.3)
GFR calc Af Amer: >60 mL/min (ref >60)
GLUCOSE: 88 mg/dL (ref 65–99)
POTASSIUM: 3.8 mmol/L (ref 3.5–5.1)
Sodium: 134 mmol/L — ABNORMAL LOW (ref 135–145)
Total Bilirubin: 0.6 mg/dL (ref 0.3–1.2)
Total Protein: 6.2 g/dL — ABNORMAL LOW (ref 6.5–8.1)

## 2015-11-02 LAB — CBC
HEMATOCRIT: 39.1 % (ref 39.0–52.0)
Hemoglobin: 12.9 g/dL — ABNORMAL LOW (ref 13.0–17.0)
MCH: 31.1 pg (ref 26.0–34.0)
MCHC: 33 g/dL (ref 30.0–36.0)
MCV: 94.2 fL (ref 78.0–100.0)
PLATELETS: 161 10*3/uL (ref 150–400)
RBC: 4.15 MIL/uL — ABNORMAL LOW (ref 4.22–5.81)
RDW: 13.2 % (ref 11.5–15.5)
WBC: 16.3 10*3/uL — ABNORMAL HIGH (ref 4.0–10.5)

## 2015-11-02 LAB — TOXASSURE SELECT 13 (MW), URINE

## 2015-11-02 LAB — CBG MONITORING, ED: GLUCOSE-CAPILLARY: 90 mg/dL (ref 65–99)

## 2015-11-02 LAB — LIPASE, BLOOD: LIPASE: 11 U/L (ref 11–51)

## 2015-11-02 MED ORDER — MORPHINE SULFATE (PF) 4 MG/ML IV SOLN
4.0000 mg | Freq: Once | INTRAVENOUS | Status: AC
  Administered 2015-11-02: 4 mg via INTRAVENOUS
  Filled 2015-11-02: qty 1

## 2015-11-02 MED ORDER — METRONIDAZOLE 500 MG PO TABS: 500.0000 mg | ORAL_TABLET | Freq: Two times a day (BID) | ORAL | 0 refills | Status: DC

## 2015-11-02 MED ORDER — IOPAMIDOL (ISOVUE-300) INJECTION 61%
INTRAVENOUS | Status: AC
  Administered 2015-11-02: 30 mL
  Filled 2015-11-02: qty 30

## 2015-11-02 MED ORDER — IOPAMIDOL (ISOVUE-300) INJECTION 61%
100.0000 mL | Freq: Once | INTRAVENOUS | Status: AC | PRN
  Administered 2015-11-02: 100 mL via INTRAVENOUS

## 2015-11-02 MED ORDER — OXYCODONE-ACETAMINOPHEN 5-325 MG PO TABS: 2.0000 | ORAL_TABLET | ORAL | 0 refills | Status: DC | PRN

## 2015-11-02 MED ORDER — SODIUM CHLORIDE 0.9 % IV BOLUS (SEPSIS)
1000.0000 mL | Freq: Once | INTRAVENOUS | Status: AC
  Administered 2015-11-02: 1000 mL via INTRAVENOUS

## 2015-11-02 MED ORDER — ONDANSETRON HCL 4 MG/2ML IJ SOLN
4.0000 mg | Freq: Once | INTRAMUSCULAR | Status: AC
  Administered 2015-11-02: 4 mg via INTRAVENOUS
  Filled 2015-11-02: qty 2

## 2015-11-02 MED ORDER — CIPROFLOXACIN HCL 500 MG PO TABS: 500.0000 mg | ORAL_TABLET | Freq: Two times a day (BID) | ORAL | 0 refills | Status: DC

## 2015-11-02 NOTE — ED Notes (Signed)
In radiology

## 2015-11-02 NOTE — ED Notes (Signed)
Patient seen walking to exit of ER. When asked patient where he was going, patient would not respond to Clinical research associatewriter. Then asked the patient if he was leaving or going to smoke, patient responded with " No, leave me alone". Asked patient if his IV was out, states "Yes". Patient ambulatory with no distress or difficulty with ambulation.

## 2015-11-02 NOTE — Discharge Instructions (Signed)
Cipro and Flagyl as prescribed.  Percocet as prescribed as needed for pain.  Call Dr. Darrick Pennafields office tomorrow to arrange a follow-up appointment in the next few days, and return to the emergency department if your symptoms significantly worsen or change in the meantime.

## 2015-11-02 NOTE — ED Notes (Signed)
Patient was seen outside smoking.

## 2015-11-02 NOTE — ED Triage Notes (Addendum)
Patient c/o right lower abd pain that started last night. Denies any nausea, vomiting, fevers, urinary symptoms or diarrhea. Patient states that he feels "light headed."

## 2015-11-02 NOTE — ED Provider Notes (Signed)
[place] DEPT Provider Note   CSN: [id number] Arrival date & time: 11/02/15  1538     History   Chief Complaint Chief Complaint  Patient presents with  . Abdominal Pain    HPI Charles Pennington is a 50 y.o. male.  Patient is a 50 year old male with past medical history of diabetes, hypertension, obesity. He presents for evaluation of lower abdominal pain that started yesterday and has worsened today. He feels lightheaded. He denies any vomiting or diarrhea. He denies any bloody stool. He denies any urinary complaints. The pain is constant in nature and worse when he moves or pushes on the right lower abdomen.      Past Medical History:  Diagnosis Date  . Anxiety   . Asthma   . Diabetes mellitus   . Hypercholesteremia   . Hypertension   . Hypothyroidism   . Low back pain   . Obesity   . SOB (shortness of breath)   . Stroke [facility]) age 18  . Vertigo     Patient Active Problem List   Diagnosis Date Noted  . Insomnia 10/28/2015  . Pain medication agreement signed 07/23/2015  . Opioid dependence ([place]) 07/23/2015  . Morbid obesity ([place]) 06/26/2015  . Chronic back pain 06/23/2015  . Rupture of ulnar collateral ligament of thumb 03/01/2014  . Memory difficulties 08/15/2013  . Diabetic neuropathy associated with type 2 diabetes mellitus ([place]) 08/15/2013  . GERD (gastroesophageal reflux disease) 07/19/2013  . GAD (generalized anxiety disorder) 07/19/2013  . Depression 07/19/2013  . 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 Postlaminectomy syndrome, lumbar region 07/05/2011  . Hyperlipidemia 12/30/2009  . SMOKER 12/30/2009  . DEEP VEIN THROMBOSIS/PHLEBITIS 12/30/2009  . COLITIS 05/27/2008  . Hypothyroidism 05/23/2008  . Type 2 diabetes mellitus treated with insulin ([place]) 05/23/2008  . Essential hypertension 05/23/2008  . History of CVA (cerebrovascular accident) 05/23/2008  . Asthma 05/23/2008  . FATTY LIVER DISEASE 05/23/2008  . ABDOMINAL  PAIN 05/23/2008    Past Surgical History:  Procedure Laterality Date  . FOOT SURGERY     Dr Irving Shows  . SPINE SURGERY    . WRIST SURGERY         Home Medications    Prior to Admission medications   Medication Sig Start Date End Date Taking? Authorizing Provider  ALPRAZolam Duanne Moron) 0.5 MG tablet Take 1 tablet (0.5 mg total) by mouth 2 (two) times daily as needed for anxiety. 07/23/15  Yes [name] [name], FNP  amLODipine (NORVASC) 5 MG tablet TAKE 1 TABLET (5 MG TOTAL) BY MOUTH DAILY. 04/17/15  Yes Sharion Balloon, FNP  aspirin EC 81 MG tablet Take 1 tablet (81 mg total) by mouth daily. 12/05/14  Yes Sharion Balloon, FNP  atorvastatin (LIPITOR) 80 MG tablet Take 1 tablet (80 mg total) by mouth daily. 09/08/15  Yes Sharion Balloon, FNP  canagliflozin (INVOKANA) 300 MG TABS tablet Take 1 tablet (300 mg total) by mouth daily before breakfast. 04/17/15  Yes Sharion Balloon, FNP  Dulaglutide (TRULICITY) 1.5 XJ/8.8TG SOPN Inject 1.5 mg into the skin once a week. 06/26/15  Yes Tammy Eckard, PharmD  fenofibrate 160 MG tablet TAKE 1 TABLET (160 MG TOTAL) BY MOUTH DAILY. 04/17/15  Yes [name] [name], FNP  fluticasone furoate-vilanterol (BREO ELLIPTA) 100-25 MCG/INH AEPB Inhale 1 puff into the lungs daily.   Yes Historical Provider, MD  HYDROcodone-acetaminophen (NORCO) 10-325 MG tablet Take 1 tablet by mouth every 8 (eight) hours. 07/23/15  Yes [name]  [name], FNP  levothyroxine (SYNTHROID, LEVOTHROID) 200 MCG tablet TAKE 1 TABLET (200 MCG TOTAL) BY MOUTH DAILY BEFORE BREAKFAST. 04/17/15  Yes Sharion Balloon, FNP  lisinopril (PRINIVIL,ZESTRIL) 20 MG tablet Take 1 tablet (20 mg total) by mouth daily. 04/17/15  Yes [name] [name], FNP  LYRICA 50 MG capsule Take 50 mg by mouth 3 (three) times daily. 10/14/15  Yes Historical Provider, MD  omeprazole (PRILOSEC) 20 MG capsule TAKE 1 CAPSULE (20 MG TOTAL) BY MOUTH DAILY. 08/06/15  Yes Sharion Balloon, FNP  PARoxetine (PAXIL) 40 MG tablet Take 1 tablet (40 mg total) by  mouth daily. 08/06/15  Yes Sharion Balloon, FNP  PROAIR HFA 108 [phone] Base) MCG/ACT inhaler INHALE 2 PUFFS INTO THE LUNGS EVERY 6 (SIX) HOURS AS NEEDED FOR WHEEZING. 08/06/15  Yes [name] [name], FNP  TRESIBA FLEXTOUCH 200 UNIT/ML SOPN INJECT 80 TO 90 UNITS SQ DAILY Patient taking differently: INJECT 60 UNITS DAILY 09/22/15  Yes Sharion Balloon, FNP  zolpidem (AMBIEN) 10 MG tablet Take 1 tablet (10 mg total) by mouth at bedtime as needed. for sleep 07/23/15  Yes Sharion Balloon, FNP  blood glucose meter kit and supplies KIT Dispense based on patient and insurance preference. Use up to two times daily as directed. (FOR ICD-10 type 2 DM with insulin use E11.9, Z79.4) 06/26/15   Sharion Balloon, FNP  Glucose Blood (BLOOD GLUCOSE TEST STRIPS) STRP Use to check BG up to BID.  Dx:  250.02 08/15/13   Cherre Robins, PharmD  HYDROcodone-acetaminophen (NORCO) 10-325 MG tablet Take 1 tablet by mouth every 8 (eight) hours. Patient not taking: Reported on 11/02/2015 07/23/15   Sharion Balloon, FNP  HYDROcodone-acetaminophen (NORCO) 10-325 MG tablet Take 1 tablet by mouth every 8 (eight) hours. Patient not taking: Reported on 11/02/2015 10/28/15   Sharion Balloon, FNP  Insulin Pen Needle 31G X 4 MM MISC 1 application by Does not apply route 2 (two) times daily. 12/23/14   Sharion Balloon, FNP    Family History Family History  Problem Relation Age of Onset  . Hypertension Father   . Suicidality Father   . Diabetes Father   . Diabetes Mother     Social History Social History  Substance Use Topics  . Smoking status: Current Every Day Smoker    Packs/day: 0.50    Years: 30.00    Types: Cigarettes  . Smokeless tobacco: Never Used  . Alcohol use Yes     Comment: occasional per pt     Allergies   Hydrocodone-acetaminophen   Review of Systems Review of Systems  All other systems reviewed and are negative.    Physical Exam Updated Vital Signs BP 91/58 (BP Location: Right Arm)   Pulse (S) (!) 125   Temp  98.8 F (37.1 C) (Oral)   Resp 20   Ht _0  (1.727 m)   Wt 236 lb (107 kg)   SpO2 95%   BMI 35.88 kg/m   Physical Exam  Constitutional: He is oriented to person, place, and time. He appears well-developed and well-nourished. No distress.  HENT:  Head: Normocephalic and atraumatic.  Mouth/Throat: Oropharynx is clear and moist.  Neck: Normal range of motion. Neck supple.  Cardiovascular: Normal rate and regular rhythm.  Exam reveals no friction rub.   No murmur heard. Pulmonary/Chest: Effort normal and breath sounds normal. No respiratory distress. He has no wheezes. He has no rales.  Abdominal: Soft. Bowel sounds are normal. He exhibits no  distension. There is tenderness. There is no rebound and no guarding.  There is tenderness to palpation in the right lower quadrant.  Musculoskeletal: Normal range of motion. He exhibits no edema.  Neurological: He is alert and oriented to person, place, and time. Coordination normal.  Skin: Skin is warm and dry. He is not diaphoretic.  Nursing note and vitals reviewed.    ED Treatments / Results  Labs (all labs ordered are listed, but only abnormal results are displayed) Labs Reviewed  CBC - Abnormal; Notable for the following:       Result Value   WBC 16.3 (*)    RBC 4.15 (*)    Hemoglobin 12.9 (*)    All other components within normal limits  LIPASE, BLOOD  COMPREHENSIVE METABOLIC PANEL  URINALYSIS, ROUTINE W REFLEX MICROSCOPIC (NOT AT [facility])  CBG MONITORING, ED    EKG  EKG Interpretation None       Radiology No results found.  Procedures Procedures (including critical care time)  Medications Ordered in ED Medications  ondansetron (ZOFRAN) injection 4 mg (not administered)  sodium chloride 0.9 % bolus 1,000 mL (not administered)  morphine 4 MG/ML injection 4 mg (not administered)     Initial Impression / Assessment and Plan / ED Course  I have reviewed the triage vital signs and the nursing notes.  Pertinent labs  & imaging results that were available during my care of the patient were reviewed by me and considered in my medical decision making (see chart for details).  Clinical Course    CT scan reveals a normal appendix, but does show a thickened ascending colon consistent with colitis. He will be treated with Cipro and Flagyl, pain medication, and follow-up with Dr. Oneida Alar. She has performed a colonoscopy in the past.   Final Clinical Impressions(s) / ED Diagnoses   Final diagnoses:  None    New Prescriptions New Prescriptions   No medications on file     Veryl Speak, MD 11/02/15 1928

## 2015-11-04 ENCOUNTER — Telehealth: Payer: Self-pay

## 2015-11-04 NOTE — Telephone Encounter (Signed)
Please make an appt in 2-4 weeks per AB.

## 2015-11-04 NOTE — Telephone Encounter (Signed)
Can be 2-4 week follow-up, non-urgent.

## 2015-11-04 NOTE — Telephone Encounter (Signed)
Pt called and stated he was seen in the er 11/02/15 and was told to follow-up with [facility] in 2 days. C/o abdominal pain. Please advise.

## 2015-11-05 NOTE — Telephone Encounter (Signed)
Patient scheduled for appointment.

## 2015-11-06 ENCOUNTER — Telehealth: Payer: Self-pay

## 2015-11-06 ENCOUNTER — Encounter (HOSPITAL_COMMUNITY): Payer: Self-pay | Admitting: Cardiology

## 2015-11-06 ENCOUNTER — Emergency Department (HOSPITAL_COMMUNITY)
Admission: EM | Admit: 2015-11-06 | Discharge: 2015-11-06 | Disposition: A | Discharge status: Home / Self Care | Payer: Medicare Other | Attending: Emergency Medicine | Admitting: Emergency Medicine

## 2015-11-06 ENCOUNTER — Emergency Department (HOSPITAL_COMMUNITY): Payer: Medicare Other

## 2015-11-06 DIAGNOSIS — K529 Noninfective gastroenteritis and colitis, unspecified: Secondary | ICD-10-CM | POA: Insufficient documentation

## 2015-11-06 DIAGNOSIS — R109 Unspecified abdominal pain: Secondary | ICD-10-CM | POA: Diagnosis not present

## 2015-11-06 DIAGNOSIS — M549 Dorsalgia, unspecified: Secondary | ICD-10-CM | POA: Diagnosis not present

## 2015-11-06 DIAGNOSIS — F17210 Nicotine dependence, cigarettes, uncomplicated: Secondary | ICD-10-CM | POA: Diagnosis not present

## 2015-11-06 DIAGNOSIS — Z79891 Long term (current) use of opiate analgesic: Secondary | ICD-10-CM | POA: Diagnosis not present

## 2015-11-06 DIAGNOSIS — E119 Type 2 diabetes mellitus without complications: Secondary | ICD-10-CM | POA: Insufficient documentation

## 2015-11-06 DIAGNOSIS — Z7982 Long term (current) use of aspirin: Secondary | ICD-10-CM | POA: Insufficient documentation

## 2015-11-06 DIAGNOSIS — E039 Hypothyroidism, unspecified: Secondary | ICD-10-CM | POA: Diagnosis not present

## 2015-11-06 DIAGNOSIS — J45909 Unspecified asthma, uncomplicated: Secondary | ICD-10-CM | POA: Insufficient documentation

## 2015-11-06 DIAGNOSIS — Z79899 Other long term (current) drug therapy: Secondary | ICD-10-CM | POA: Insufficient documentation

## 2015-11-06 DIAGNOSIS — I10 Essential (primary) hypertension: Secondary | ICD-10-CM | POA: Diagnosis not present

## 2015-11-06 DIAGNOSIS — Z794 Long term (current) use of insulin: Secondary | ICD-10-CM | POA: Insufficient documentation

## 2015-11-06 DIAGNOSIS — R1031 Right lower quadrant pain: Secondary | ICD-10-CM | POA: Diagnosis present

## 2015-11-06 LAB — COMPREHENSIVE METABOLIC PANEL
ALK PHOS: 66 U/L (ref 38–126)
ALT: 21 U/L (ref 17–63)
AST: 25 U/L (ref 15–41)
Albumin: 3.7 g/dL (ref 3.5–5.0)
Anion gap: 8 (ref 5–15)
BILIRUBIN TOTAL: 0.2 mg/dL — AB (ref 0.3–1.2)
BUN: 22 mg/dL — ABNORMAL HIGH (ref 6–20)
CALCIUM: 9.4 mg/dL (ref 8.9–10.3)
CO2: 26 mmol/L (ref 22–32)
CREATININE: 1.06 mg/dL (ref 0.61–1.24)
Chloride: 100 mmol/L — ABNORMAL LOW (ref 101–111)
Glucose, Bld: 199 mg/dL — ABNORMAL HIGH (ref 65–99)
Potassium: 3.9 mmol/L (ref 3.5–5.1)
Sodium: 134 mmol/L — ABNORMAL LOW (ref 135–145)
Total Protein: 7.5 g/dL (ref 6.5–8.1)

## 2015-11-06 LAB — URINALYSIS, ROUTINE W REFLEX MICROSCOPIC
Bilirubin Urine: NEGATIVE
Glucose, UA: >1000 mg/dL — AB
Hgb urine dipstick: NEGATIVE
Ketones, ur: NEGATIVE mg/dL
LEUKOCYTES UA: NEGATIVE
Nitrite: NEGATIVE
PROTEIN: NEGATIVE mg/dL
Specific Gravity, Urine: 1.015 (ref 1.005–1.030)
pH: 5 (ref 5.0–8.0)

## 2015-11-06 LAB — CBC
HCT: 46.8 % (ref 39.0–52.0)
Hemoglobin: 16.1 g/dL (ref 13.0–17.0)
MCH: 31.3 pg (ref 26.0–34.0)
MCHC: 34.4 g/dL (ref 30.0–36.0)
MCV: 91.1 fL (ref 78.0–100.0)
PLATELETS: 301 10*3/uL (ref 150–400)
RBC: 5.14 MIL/uL (ref 4.22–5.81)
RDW: 12.8 % (ref 11.5–15.5)
WBC: 10.2 10*3/uL (ref 4.0–10.5)

## 2015-11-06 LAB — URINE MICROSCOPIC-ADD ON
Bacteria, UA: NONE SEEN
RBC / HPF: NONE SEEN RBC/hpf (ref 0–5)
WBC UA: NONE SEEN WBC/hpf (ref 0–5)

## 2015-11-06 LAB — LIPASE, BLOOD: Lipase: 21 U/L (ref 11–51)

## 2015-11-06 LAB — CBG MONITORING, ED: GLUCOSE-CAPILLARY: 245 mg/dL — AB (ref 65–99)

## 2015-11-06 MED ORDER — IOPAMIDOL (ISOVUE-300) INJECTION 61%
100.0000 mL | Freq: Once | INTRAVENOUS | Status: AC | PRN
  Administered 2015-11-06: 100 mL via INTRAVENOUS

## 2015-11-06 MED ORDER — IOPAMIDOL (ISOVUE-300) INJECTION 61%
INTRAVENOUS | Status: AC
  Administered 2015-11-06: 30 mL
  Filled 2015-11-06: qty 30

## 2015-11-06 MED ORDER — SODIUM CHLORIDE 0.9 % IV BOLUS (SEPSIS)
1000.0000 mL | Freq: Once | INTRAVENOUS | Status: AC
  Administered 2015-11-06: 1000 mL via INTRAVENOUS

## 2015-11-06 MED ORDER — HYDROMORPHONE HCL 1 MG/ML IJ SOLN
1.0000 mg | Freq: Once | INTRAMUSCULAR | Status: AC
  Administered 2015-11-06: 1 mg via INTRAVENOUS
  Filled 2015-11-06: qty 1

## 2015-11-06 NOTE — ED Notes (Signed)
Patient returned from CT

## 2015-11-06 NOTE — ED Triage Notes (Addendum)
Abdominal pain since Saturday.  Seen here Sunday with same.  States his pain is worse. C/o   Diarrhea.  Has a F/U with GI 11/25/15.

## 2015-11-06 NOTE — ED Provider Notes (Signed)
[place] DEPT Provider Note   CSN: [id number] Arrival date & time: 11/06/15  1145  By signing my name below, I, Johnney Killian, attest that this documentation has been prepared under the direction and in the presence of Merrily Pew, MD. Electronically Signed: Johnney Killian, ED Scribe. 11/06/15. 2:22 PM.   History   Chief Complaint Chief Complaint  Patient presents with  . Abdominal Pain    HPI Comments: Charles Pennington is a 50 y.o. male who presents to the Emergency Department complaining of stabbing, gradually worsened abdominal pain with onset 5 days ago. Pt describes pain like "being hit with a baseball bat" in his periumbilical region with pain to his RLQ. He states the pain is sometimes relieved with applied pressure to the affected area.  Pt was diagnosed with colitis on 11/02/15 and was prescribed ciprofloxacin, Flagyl, and pain medications. Past medical history includes DM. Pt says his blood sugar is high today. Pt is a smoker.   The history is provided by the patient. No language interpreter was used.    Past Medical History:  Diagnosis Date  . Anxiety   . Asthma   . Diabetes mellitus   . Hypercholesteremia   . Hypertension   . Hypothyroidism   . Low back pain   . Obesity   . SOB (shortness of breath)   . Stroke [facility]) age 55  . Vertigo     Patient Active Problem List   Diagnosis Date Noted  . Insomnia 10/28/2015  . Pain medication agreement signed 07/23/2015  . Opioid dependence ([place]) 07/23/2015  . Morbid obesity ([place]) 06/26/2015  . Chronic back pain 06/23/2015  . Rupture of ulnar collateral ligament of thumb 03/01/2014  . Memory difficulties 08/15/2013  . Diabetic neuropathy associated with type 2 diabetes mellitus ([place]) 08/15/2013  . GERD (gastroesophageal reflux disease) 07/19/2013  . GAD (generalized anxiety disorder) 07/19/2013  . Depression 07/19/2013  . 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 Postlaminectomy syndrome,  lumbar region 07/05/2011  . Hyperlipidemia 12/30/2009  . SMOKER 12/30/2009  . DEEP VEIN THROMBOSIS/PHLEBITIS 12/30/2009  . COLITIS 05/27/2008  . Hypothyroidism 05/23/2008  . Type 2 diabetes mellitus treated with insulin ([place]) 05/23/2008  . Essential hypertension 05/23/2008  . History of CVA (cerebrovascular accident) 05/23/2008  . Asthma 05/23/2008  . FATTY LIVER DISEASE 05/23/2008  . ABDOMINAL PAIN 05/23/2008    Past Surgical History:  Procedure Laterality Date  . FOOT SURGERY     Dr Irving Shows  . SPINE SURGERY    . WRIST SURGERY         Home Medications    Prior to Admission medications   Medication Sig Start Date End Date Taking? Authorizing Provider  ALPRAZolam Duanne Moron) 0.5 MG tablet Take 1 tablet (0.5 mg total) by mouth 2 (two) times daily as needed for anxiety. 07/23/15  Yes [name] [name], FNP  amLODipine (NORVASC) 5 MG tablet TAKE 1 TABLET (5 MG TOTAL) BY MOUTH DAILY. 04/17/15  Yes Sharion Balloon, FNP  aspirin EC 81 MG tablet Take 1 tablet (81 mg total) by mouth daily. 12/05/14  Yes Sharion Balloon, FNP  atorvastatin (LIPITOR) 80 MG tablet Take 1 tablet (80 mg total) by mouth daily. 09/08/15  Yes Sharion Balloon, FNP  blood glucose meter kit and supplies KIT Dispense based on patient and insurance preference. Use up to two times daily as directed. (FOR ICD-10 type 2 DM with insulin use E11.9, Z79.4) 06/26/15  Yes Sharion Balloon, FNP  canagliflozin [facility])  300 MG TABS tablet Take 1 tablet (300 mg total) by mouth daily before breakfast. 04/17/15  Yes Sharion Balloon, FNP  ciprofloxacin (CIPRO) 500 MG tablet Take 1 tablet (500 mg total) by mouth 2 (two) times daily. 11/02/15  Yes Veryl Speak, MD  Dulaglutide (TRULICITY) 1.5 JQ/7.3AL SOPN Inject 1.5 mg into the skin once a week. 06/26/15  Yes Tammy Eckard, PharmD  fenofibrate 160 MG tablet TAKE 1 TABLET (160 MG TOTAL) BY MOUTH DAILY. 04/17/15  Yes [name] [name], FNP  fluticasone furoate-vilanterol (BREO ELLIPTA) 100-25 MCG/INH AEPB  Inhale 1 puff into the lungs daily.   Yes Historical Provider, MD  Glucose Blood (BLOOD GLUCOSE TEST STRIPS) STRP Use to check BG up to BID.  Dx:  250.02 08/15/13  Yes Cherre Robins, PharmD  Insulin Pen Needle 31G X 4 MM MISC 1 application by Does not apply route 2 (two) times daily. 12/23/14  Yes Sharion Balloon, FNP  levothyroxine (SYNTHROID, LEVOTHROID) 200 MCG tablet TAKE 1 TABLET (200 MCG TOTAL) BY MOUTH DAILY BEFORE BREAKFAST. 04/17/15  Yes Sharion Balloon, FNP  lisinopril (PRINIVIL,ZESTRIL) 20 MG tablet Take 1 tablet (20 mg total) by mouth daily. 04/17/15  Yes [name] [name], FNP  LYRICA 50 MG capsule Take 50 mg by mouth 3 (three) times daily. 10/14/15  Yes Historical Provider, MD  metroNIDAZOLE (FLAGYL) 500 MG tablet Take 1 tablet (500 mg total) by mouth 2 (two) times daily. 11/02/15  Yes Veryl Speak, MD  omeprazole (PRILOSEC) 20 MG capsule TAKE 1 CAPSULE (20 MG TOTAL) BY MOUTH DAILY. 08/06/15  Yes Sharion Balloon, FNP  oxyCODONE-acetaminophen (PERCOCET) 5-325 MG tablet Take 2 tablets by mouth every 4 (four) hours as needed. 11/02/15  Yes Veryl Speak, MD  PARoxetine (PAXIL) 40 MG tablet Take 1 tablet (40 mg total) by mouth daily. 08/06/15  Yes Sharion Balloon, FNP  PROAIR HFA 108 [phone] Base) MCG/ACT inhaler INHALE 2 PUFFS INTO THE LUNGS EVERY 6 (SIX) HOURS AS NEEDED FOR WHEEZING. 08/06/15  Yes [name] [name], FNP  TRESIBA FLEXTOUCH 200 UNIT/ML SOPN INJECT 80 TO 90 UNITS SQ DAILY Patient taking differently: INJECT 60 UNITS DAILY 09/22/15  Yes Sharion Balloon, FNP  zolpidem (AMBIEN) 10 MG tablet Take 1 tablet (10 mg total) by mouth at bedtime as needed. for sleep 07/23/15  Yes Sharion Balloon, FNP  HYDROcodone-acetaminophen (NORCO) 10-325 MG tablet Take 1 tablet by mouth every 8 (eight) hours. 07/23/15   Sharion Balloon, FNP    Family History Family History  Problem Relation Age of Onset  . Hypertension Father   . Suicidality Father   . Diabetes Father   . Diabetes Mother     Social History Social  History  Substance Use Topics  . Smoking status: Current Every Day Smoker    Packs/day: 0.50    Years: 30.00    Types: Cigarettes  . Smokeless tobacco: Never Used  . Alcohol use Yes     Comment: occasional per pt     Allergies   Hydrocodone-acetaminophen   Review of Systems Review of Systems  Gastrointestinal: Positive for abdominal pain.  Musculoskeletal: Positive for back pain.  All other systems reviewed and are negative.    Physical Exam Updated Vital Signs BP 97/69   Pulse 105   Temp 99 F (37.2 C) (Oral)   Resp 20   SpO2 95%   Physical Exam  Constitutional: He is oriented to person, place, and time. He appears well-developed and well-nourished.  HENT:  Head: Normocephalic and atraumatic.  Cardiovascular: Normal rate, regular rhythm and normal heart sounds.  Exam reveals no gallop and no friction rub.   No murmur heard. Pulmonary/Chest: Effort normal. No respiratory distress. He has no wheezes. He has rhonchi.  Rhonchi auscultated on the right. No wheezing or respiratory distress.  Abdominal: Soft. He exhibits no distension. There is tenderness. There is rebound and guarding.  Guarding and rebound tenderness noted on the RLQ with similar symptoms in the RUQ but lessened in severity Increased bowel sounds  Neurological: He is alert and oriented to person, place, and time. He displays normal reflexes. No cranial nerve deficit.  Skin: Skin is warm and dry. Rash noted.  Face shows vesicular rash on upper and lower lips with minimal surrounding erythema  Psychiatric: He has a normal mood and affect.  Nursing note and vitals reviewed.    ED Treatments / Results   DIAGNOSTIC STUDIES: Oxygen Saturation is 96% on RA, adequate by my interpretation.    COORDINATION OF CARE: 1:15 PM Discussed treatment plan with pt at bedside and pt agreed to plan.   Labs (all labs ordered are listed, but only abnormal results are displayed) Labs Reviewed  COMPREHENSIVE  METABOLIC PANEL - Abnormal; Notable for the following:       Result Value   Sodium 134 (*)    Chloride 100 (*)    Glucose, Bld 199 (*)    BUN 22 (*)    Total Bilirubin 0.2 (*)    All other components within normal limits  URINALYSIS, ROUTINE W REFLEX MICROSCOPIC (NOT AT [facility]) - Abnormal; Notable for the following:    Glucose, UA >1000 (*)    All other components within normal limits  URINE MICROSCOPIC-ADD ON - Abnormal; Notable for the following:    Squamous Epithelial / LPF 0-5 (*)    All other components within normal limits  CBG MONITORING, ED - Abnormal; Notable for the following:    Glucose-Capillary 245 (*)    All other components within normal limits  LIPASE, BLOOD  CBC    EKG  EKG Interpretation None       Radiology Ct Abdomen Pelvis W Contrast  Result Date: 11/06/2015 CLINICAL DATA:  Abdominal pain starting Saturday EXAM: CT ABDOMEN AND PELVIS WITH CONTRAST TECHNIQUE: Multidetector CT imaging of the abdomen and pelvis was performed using the standard protocol following bolus administration of intravenous contrast. CONTRAST:  66m ISOVUE-300 IOPAMIDOL (ISOVUE-300) INJECTION 61%, 1059mISOVUE-300 IOPAMIDOL (ISOVUE-300) INJECTION 61% COMPARISON:  11/02/2015 FINDINGS: Lower chest: Lung bases are unremarkable. Hepatobiliary: Mild fatty infiltration of the liver. No calcified gallstones are noted within gallbladder. Pancreas: Unremarkable. No pancreatic ductal dilatation or surrounding inflammatory changes. Spleen: Normal in size without focal abnormality. Adrenals/Urinary Tract: No adrenal gland mass. Enhanced kidneys are symmetrical in size. No hydronephrosis or hydroureter. Delayed renal images shows bilateral renal symmetrical excretion. Bilateral visualized proximal ureter is unremarkable. Nonspecific mild thickening of anterior wall of urinary bladder. Mild cystitis cannot be excluded. Stomach/Bowel: No gastric outlet obstruction. No small bowel obstruction. Normal appendix.  The terminal ileum is unremarkable. There is mild thickening of cecal wall and proximal ascending colonic wall. Subtle mild stranding of pericecal fat. Findings are consistent with residual mild colitis with improvement from prior exam. There is no evidence of pericolonic abscess or perforation. Distal colon is unremarkable. No distal colonic obstruction. Vascular/Lymphatic: No aortic aneurysm. No retroperitoneal or mesenteric adenopathy. Reproductive: Prostate gland and seminal vesicles are unremarkable. Other: Persistent calcification in posterior pelvis measures 1 cm  probable calcified lymph node. No ascites or free abdominal air. Bilateral inguinal scrotal canal hernia containing fat without evidence of acute complication. Musculoskeletal: No destructive bony lesions are noted. Sagittal images of the spine shows mild degenerative changes lower thoracic spine. Posterior fusion noted L3-L4 level. The alignment is preserved. IMPRESSION: 1. There is persistent mild thickening of the wall in cecum and proximal right colon. Findings consistent with improving mild colitis. Normal appendix. The terminal ileum is unremarkable. No distal colonic obstruction. 2. No hydronephrosis or hydroureter. 3. Mild fatty infiltration of the liver. 4. Postsurgical changes lumbar spine with fusion at L3-L4 level. 5. No small bowel obstruction. 6. Nonspecific mild thickening of urinary bladder wall. Mild cystitis cannot be excluded. Electronically Signed   By: Lahoma Crocker M.D.   On: 11/06/2015 16:44    Procedures Procedures (including critical care time)  Medications Ordered in ED Medications  HYDROmorphone (DILAUDID) injection 1 mg (1 mg Intravenous Given 11/06/15 1357)  sodium chloride 0.9 % bolus 1,000 mL (0 mLs Intravenous Stopped 11/06/15 1617)  iopamidol (ISOVUE-300) 61 % injection (30 mLs  Contrast Given 11/06/15 1609)  iopamidol (ISOVUE-300) 61 % injection 100 mL (100 mLs Intravenous Contrast Given 11/06/15 1610)      Initial Impression / Assessment and Plan / ED Course  I have reviewed the triage vital signs and the nursing notes.  Pertinent labs & imaging results that were available during my care of the patient were reviewed by me and considered in my medical decision making (see chart for details).  Clinical Course    Patient here with worsening pain of his right lower quadrant however CT scan with an improving infection symptoms. He is tolerating by mouth without difficulty. Suspect patient was wanting more oxycodone at this up multiple times in conversation however after reviewing his narcotic database with him and had received 90 hydrocodone 7 days ago patient decided he would just take that instead of getting more oxycodone. CT without any evidence of abscess or perforation or other palpitations.   Final Clinical Impressions(s) / ED Diagnoses   Final diagnoses:  Colitis    New Prescriptions Discharge Medication List as of 11/06/2015  5:31 PM     I personally performed the services described in this documentation, which was scribed in my presence. The recorded information has been reviewed and is accurate.   Merrily Pew, MD 11/06/15 1800

## 2015-11-06 NOTE — Telephone Encounter (Signed)
-----   Message from Phillips Odorarol S Pullins, RN sent at 11/06/2015 12:58 PM EDT ----- Regarding: needs appt. with Dr. Arbie CookeyEarly Please schedule for new patient consult with Dr. Arbie CookeyEarly, prior to ALIF  12/24/15.  Dr. Arbie CookeyEarly will be assisting Dr. Shon BatonBrooks.  Please remind the pt. to bring copy of LS spine films to appt.

## 2015-11-06 NOTE — Telephone Encounter (Signed)
Spoke to pt on phone for f/u appt with Dr Early for new pt ALIF on 10/31

## 2015-11-07 ENCOUNTER — Telehealth: Payer: Self-pay | Admitting: Family

## 2015-11-07 ENCOUNTER — Ambulatory Visit: Payer: Self-pay | Admitting: Physician Assistant

## 2015-11-07 NOTE — Telephone Encounter (Signed)
Patient aware and already has appt for 9/26

## 2015-11-07 NOTE — Telephone Encounter (Signed)
If patient completed antibiotic, he does not need refill. If patient continues to have abdominal pain he needs to be seen.

## 2015-11-11 ENCOUNTER — Other Ambulatory Visit: Payer: Self-pay | Admitting: Family

## 2015-11-11 ENCOUNTER — Ambulatory Visit: Payer: Medicare Other | Admitting: Family

## 2015-11-11 DIAGNOSIS — F1120 Opioid dependence, uncomplicated: Secondary | ICD-10-CM

## 2015-11-11 DIAGNOSIS — G8929 Other chronic pain: Secondary | ICD-10-CM

## 2015-11-11 DIAGNOSIS — M549 Dorsalgia, unspecified: Principal | ICD-10-CM

## 2015-11-12 ENCOUNTER — Encounter: Payer: Self-pay | Admitting: Family

## 2015-11-12 ENCOUNTER — Other Ambulatory Visit: Payer: Self-pay | Admitting: Family

## 2015-11-18 ENCOUNTER — Encounter: Payer: Self-pay | Admitting: Family

## 2015-11-18 ENCOUNTER — Ambulatory Visit (INDEPENDENT_AMBULATORY_CARE_PROVIDER_SITE_OTHER): Payer: Medicare Other | Admitting: Family

## 2015-11-18 VITALS — BP 133/87 | HR 96 | Temp 97.4°F | Ht 68.0 in | Wt 232.0 lb

## 2015-11-18 DIAGNOSIS — G8929 Other chronic pain: Secondary | ICD-10-CM | POA: Diagnosis not present

## 2015-11-18 DIAGNOSIS — M5440 Lumbago with sciatica, unspecified side: Secondary | ICD-10-CM

## 2015-11-18 DIAGNOSIS — Z09 Encounter for follow-up examination after completed treatment for conditions other than malignant neoplasm: Secondary | ICD-10-CM | POA: Diagnosis not present

## 2015-11-18 DIAGNOSIS — Z8673 Personal history of transient ischemic attack (TIA), and cerebral infarction without residual deficits: Secondary | ICD-10-CM | POA: Diagnosis not present

## 2015-11-18 DIAGNOSIS — K529 Noninfective gastroenteritis and colitis, unspecified: Secondary | ICD-10-CM

## 2015-11-18 MED ORDER — FLUTICASONE FUROATE-VILANTEROL 100-25 MCG/INH IN AEPB: 1.0000 | INHALATION_SPRAY | Freq: Every day | RESPIRATORY_TRACT | 1 refills | Status: DC

## 2015-11-18 NOTE — Progress Notes (Signed)
   Subjective:    Patient ID: Charles Pennington, male    DOB: 1965/03/14, 50 y.o.   MRN: [id number]  HPI Pt presents to the office today for hospital follow up for colitis. PT was seen in the ED on 11/02/15 and 11/06/15 and was given antibiotic therapy. PT reports doing much better and denies any abdominal pain at this time.  PT has spinal surgery planned for 12/24/15 and had his pre-op exam on 10/29/15. Pt requesting his paperwork be filled out today to clear him for surgery.   [facility]Hospital notes reviewed.    Review of Systems  Musculoskeletal: Positive for back pain.  All other systems reviewed and are negative.      Objective:   Physical Exam  Constitutional: He is oriented to person, place, and time. He appears well-developed and well-nourished. No distress.  HENT:  Head: Normocephalic.  Right Ear: External ear normal.  Left Ear: External ear normal.  Nose: Nose normal.  Mouth/Throat: Oropharynx is clear and moist.  Eyes: Pupils are equal, round, and reactive to light. Right eye exhibits no discharge. Left eye exhibits no discharge.  Neck: Normal range of motion. Neck supple. No thyromegaly present.  Cardiovascular: Normal rate, regular rhythm, normal heart sounds and intact distal pulses.   No murmur heard. Pulmonary/Chest: Effort normal and breath sounds normal. No respiratory distress. He has no wheezes.  Abdominal: Soft. Bowel sounds are normal. He exhibits no distension. There is no tenderness.  Musculoskeletal: Normal range of motion. He exhibits no edema or tenderness.  Neurological: He is alert and oriented to person, place, and time.  Skin: Skin is warm and dry. No rash noted. No erythema.  Psychiatric: He has a normal mood and affect. His behavior is normal. Judgment and thought content normal.  Vitals reviewed.     BP 133/87   Pulse 96   Temp 97.4 F (36.3 C) (Oral)   Ht 5\' 8"  (1.727 m)   Wt 232 lb (105.2 kg)   BMI 35.28 kg/m  `    Assessment & Plan:  1.  Colitis -Resolved  2. Hospital discharge follow-up   3. Chronic bilateral low back pain with sciatica, sciatica laterality unspecified -Surgery clearance sign Keep all appts with Ortho RTO prn and keep chronic follow up appts  Charles Rodneyhristy Sarayah Bacchi, FNP

## 2015-11-18 NOTE — Patient Instructions (Signed)
Keep appt with surgeon

## 2015-11-24 ENCOUNTER — Other Ambulatory Visit: Payer: Self-pay

## 2015-11-25 ENCOUNTER — Encounter: Payer: Self-pay | Admitting: Gastroenterology

## 2015-11-25 ENCOUNTER — Ambulatory Visit (INDEPENDENT_AMBULATORY_CARE_PROVIDER_SITE_OTHER): Payer: Medicare Other | Admitting: Gastroenterology

## 2015-11-25 VITALS — BP 105/76 | HR 121 | Temp 97.7°F | Ht 68.0 in | Wt 225.2 lb

## 2015-11-25 DIAGNOSIS — K529 Noninfective gastroenteritis and colitis, unspecified: Secondary | ICD-10-CM

## 2015-11-25 DIAGNOSIS — I6350 Cerebral infarction due to unspecified occlusion or stenosis of unspecified cerebral artery: Secondary | ICD-10-CM

## 2015-11-25 NOTE — Patient Instructions (Signed)
1. I will let you know if Dr. Darrick PennaFields recommends another colonoscopy at this time.  2. Please call if you have worsening abdominal pain, recurrent diarrhea.

## 2015-11-25 NOTE — Progress Notes (Addendum)
REVIEWED. I PERSONALLY REVIEWED THE CT WITH DR. WATTS-COLITIS, NO SMA STENOSIS. UNCOMPLICATED COLITIS. NO ADDITIONAL WORKUP NEEDED.   Primary Care Physician: Evelina Dun, FNP  Primary Gastroenterologist:  Barney Drain, MD   Chief Complaint  Patient presents with  . Follow-up    RLQ  pain better since seen in ER for colitis    HPI: Charles Pennington is a 50 y.o. male here for further evaluation of colitis. Patient states symptoms first began around September 10. He recalls drinking water from a cooler that was filled with well water. The water appear to be dirty/muddy but he was thirsty and had no money. [place] That evening he developed vomiting which occurred all night. The following week he developed acute onset lower abdominal pain predominantly right sided associated with diarrhea. No fever. No ill contacts. No recent antibiotics. Patient went to the emergency department the first time on September 17. CT scan abdomen pelvis with contrast showed wall thickening with subtle adjacent inflammatory changes involving the ascending colon compatible with acute colitis likely of infectious or inflammatory nature. Possible wall thickening of the greater curvature of the stomach unchanged and may be due to under distention versus gastritis. Patient's white blood cell count 16,300 at the time. He was started on Cipro and Flagyl. Symptoms continued and he went back to the emergency department 4 days later. White blood cell count of 10,200 at that time. Repeat CT abdomen pelvis with contrast showed persistent mild thickening of the wall of the cecum and proximal right colon with improvement. Normal appendix. Normal terminal ileum.  Pain is a lot better now. Still persists some in the right lower quadrant. Bowel movements back to normal. One solid stool daily. No blood in the stool or melena. No fever. Appetite somewhat diminished. Afraid to eat. No heartburn or dysphagia. Of note,  patient lives in a retirement home. Not aware of ill contacts.  Scheduled for back surgery in 12/24/2015.   Patient had similar episode back in 2009 with similar presentation. He underwent ileocolonoscopy at the time which showed patchy erythema with erosions and ulcerations in the proximal ascending colon as well as the cecum. Normal terminal ileum. Biopsies most consistent with ischemic injury versus NSAID versus infectious etiology.  Current Outpatient Prescriptions  Medication Sig Dispense Refill  . ALPRAZolam (XANAX) 0.5 MG tablet Take 1 tablet (0.5 mg total) by mouth 2 (two) times daily as needed for anxiety. 60 tablet 3  . amLODipine (NORVASC) 5 MG tablet TAKE 1 TABLET (5 MG TOTAL) BY MOUTH DAILY. 90 tablet 3  . aspirin EC 81 MG tablet Take 1 tablet (81 mg total) by mouth daily. 90 tablet 1  . atorvastatin (LIPITOR) 80 MG tablet Take 1 tablet (80 mg total) by mouth daily. 90 tablet 0  . blood glucose meter kit and supplies KIT Dispense based on patient and insurance preference. Use up to two times daily as directed. (FOR ICD-10 type 2 DM with insulin use E11.9, Z79.4) 1 each 0  . canagliflozin (INVOKANA) 300 MG TABS tablet Take 1 tablet (300 mg total) by mouth daily before breakfast. 90 tablet 2  . Dulaglutide (TRULICITY) 1.5 BJ/4.7WG SOPN Inject 1.5 mg into the skin once a week. 4 pen 2  . fenofibrate 160 MG tablet TAKE 1 TABLET (160 MG TOTAL) BY MOUTH DAILY. 90 tablet 3  . fluticasone furoate-vilanterol (BREO ELLIPTA) 100-25 MCG/INH AEPB Inhale 1 puff into the lungs daily. 90 each 1  . Glucose Blood (  BLOOD GLUCOSE TEST STRIPS) STRP Use to check BG up to BID.  Dx:  250.02 100 each 2  . HYDROcodone-acetaminophen (NORCO) 10-325 MG tablet Take 1 tablet by mouth every 8 (eight) hours. 90 tablet 0  . Insulin Pen Needle 31G X 4 MM MISC 1 application by Does not apply route 2 (two) times daily. 100 each 3  . levothyroxine (SYNTHROID, LEVOTHROID) 200 MCG tablet TAKE 1 TABLET (200 MCG TOTAL) BY  MOUTH DAILY BEFORE BREAKFAST. 90 tablet 3  . lisinopril (PRINIVIL,ZESTRIL) 20 MG tablet Take 1 tablet (20 mg total) by mouth daily. 90 tablet 3  . LYRICA 50 MG capsule Take 50 mg by mouth 3 (three) times daily.  4  . omeprazole (PRILOSEC) 20 MG capsule TAKE 1 CAPSULE (20 MG TOTAL) BY MOUTH DAILY. 90 capsule 3  . PARoxetine (PAXIL) 40 MG tablet Take 1 tablet (40 mg total) by mouth daily. 90 tablet 3  . PROAIR HFA 108 (90 Base) MCG/ACT inhaler INHALE 2 PUFFS INTO THE LUNGS EVERY 6 (SIX) HOURS AS NEEDED FOR WHEEZING. 8.5 g 5  . TRESIBA FLEXTOUCH 200 UNIT/ML SOPN INJECT 80 TO 90 UNITS SQ DAILY (Patient taking differently: INJECT 60 UNITS DAILY) 18 mL 0  . zolpidem (AMBIEN) 10 MG tablet Take 1 tablet (10 mg total) by mouth at bedtime as needed. for sleep 30 tablet 3   Current Facility-Administered Medications  Medication Dose Route Frequency Provider Last Rate Last Dose  . fluticasone furoate-vilanterol (BREO ELLIPTA) 100-25 MCG/INH 1 puff  1 puff Inhalation Daily Claretta Fraise, MD        Allergies as of 11/25/2015 - Review Complete 11/25/2015  Allergen Reaction Noted  . Hydrocodone-acetaminophen Itching 07/29/2011   Past Medical History:  Diagnosis Date  . Anxiety   . Asthma   . Diabetes mellitus   . Hypercholesteremia   . Hypertension   . Hypothyroidism   . Low back pain   . Obesity   . SOB (shortness of breath)   . Stroke [facility]) age 29  . Vertigo     Past Surgical History:  Procedure Laterality Date  . BACK SURGERY  2013  . COLONOSCOPY  2009   Inflammatory changes of the cecum and ascending colon most consistent with infectious etiology, NSAID, ischemia. Suspected resolving infection based on symptomatology.  [facility] FOOT SURGERY     Dr Irving Shows  . SHOULDER ARTHROSCOPY Right   . SPINE SURGERY    . WRIST SURGERY     Family History  Problem Relation Age of Onset  . Hypertension Father   . Suicidality Father   . Diabetes Father   . Diabetes Mother   . Colon cancer Neg Hx   .  Inflammatory bowel disease Neg Hx    Social History  Substance Use Topics  . Smoking status: Current Every Day Smoker    Packs/day: 0.50    Years: 30.00    Types: Cigarettes  . Smokeless tobacco: Never Used  . Alcohol use Yes     Comment: occasional per pt    ROS:  General: Negative for  weight loss, fever, chills, fatigue, weakness. See hpi ENT: Negative for hoarseness, difficulty swallowing , nasal congestion. CV: Negative for chest pain, angina, palpitations, dyspnea on exertion, peripheral edema.  Respiratory: Negative for dyspnea at rest, dyspnea on exertion, cough, sputum, wheezing.  GI: See history of present illness. GU:  Negative for dysuria, hematuria, urinary incontinence, urinary frequency, nocturnal urination.  Endo: Negative for unusual weight change.    Physical Examination:  BP 105/76   Pulse (!) 121   Temp 97.7 F (36.5 C) (Oral)   Ht 5' 8"  (1.727 m)   Wt 225 lb 3.2 oz (102.2 kg)   BMI 34.24 kg/m   General: Well-nourished, well-developed in no acute distress.  Eyes: No icterus. Mouth: Oropharyngeal mucosa moist and pink , no lesions erythema or exudate. Lungs: Clear to auscultation bilaterally.  Heart: Regular rate and rhythm, no murmurs rubs or gallops.  Abdomen: Bowel sounds are normal, mild rlq tenderness, nondistended, no hepatosplenomegaly or masses, no abdominal bruits or hernia , no rebound or guarding.   Extremities: No lower extremity edema. No clubbing or deformities. Neuro: Alert and oriented x 4   Skin: Warm and dry, no jaundice.   Psych: Alert and cooperative, normal mood and affect.  Labs:  Lab Results  Component Value Date   CREATININE 1.06 11/06/2015   BUN 22 (H) 11/06/2015   NA 134 (L) 11/06/2015   K 3.9 11/06/2015   CL 100 (L) 11/06/2015   CO2 26 11/06/2015   Lab Results  Component Value Date   WBC 10.2 11/06/2015   HGB 16.1 11/06/2015   HCT 46.8 11/06/2015   MCV 91.1 11/06/2015   PLT 301 11/06/2015   Lab Results    Component Value Date   ALT 21 11/06/2015   AST 25 11/06/2015   ALKPHOS 66 11/06/2015   BILITOT 0.2 (L) 11/06/2015   Lab Results  Component Value Date   LIPASE 21 11/06/2015    Imaging Studies: Dg Chest 2 View  Result Date: 10/29/2015 CLINICAL DATA:  Wheezing, history of asthma EXAM: CHEST  2 VIEW COMPARISON:  Chest x-ray of 04/16/2014 FINDINGS: No active infiltrate or effusion is seen. Mediastinal and hilar contours are unremarkable. The heart is within normal limits in size. No bony abnormality is seen. IMPRESSION: No active cardiopulmonary disease. Electronically Signed   By: Ivar Drape M.D.   On: 10/29/2015 09:12   Ct Abdomen Pelvis W Contrast  Result Date: 11/06/2015 CLINICAL DATA:  Abdominal pain starting Saturday EXAM: CT ABDOMEN AND PELVIS WITH CONTRAST TECHNIQUE: Multidetector CT imaging of the abdomen and pelvis was performed using the standard protocol following bolus administration of intravenous contrast. CONTRAST:  26m ISOVUE-300 IOPAMIDOL (ISOVUE-300) INJECTION 61%, 1082mISOVUE-300 IOPAMIDOL (ISOVUE-300) INJECTION 61% COMPARISON:  11/02/2015 FINDINGS: Lower chest: Lung bases are unremarkable. Hepatobiliary: Mild fatty infiltration of the liver. No calcified gallstones are noted within gallbladder. Pancreas: Unremarkable. No pancreatic ductal dilatation or surrounding inflammatory changes. Spleen: Normal in size without focal abnormality. Adrenals/Urinary Tract: No adrenal gland mass. Enhanced kidneys are symmetrical in size. No hydronephrosis or hydroureter. Delayed renal images shows bilateral renal symmetrical excretion. Bilateral visualized proximal ureter is unremarkable. Nonspecific mild thickening of anterior wall of urinary bladder. Mild cystitis cannot be excluded. Stomach/Bowel: No gastric outlet obstruction. No small bowel obstruction. Normal appendix. The terminal ileum is unremarkable. There is mild thickening of cecal wall and proximal ascending colonic wall. Subtle  mild stranding of pericecal fat. Findings are consistent with residual mild colitis with improvement from prior exam. There is no evidence of pericolonic abscess or perforation. Distal colon is unremarkable. No distal colonic obstruction. Vascular/Lymphatic: No aortic aneurysm. No retroperitoneal or mesenteric adenopathy. Reproductive: Prostate gland and seminal vesicles are unremarkable. Other: Persistent calcification in posterior pelvis measures 1 cm probable calcified lymph node. No ascites or free abdominal air. Bilateral inguinal scrotal canal hernia containing fat without evidence of acute complication. Musculoskeletal: No destructive bony lesions are noted. Sagittal images of the  spine shows mild degenerative changes lower thoracic spine. Posterior fusion noted L3-L4 level. The alignment is preserved. IMPRESSION: 1. There is persistent mild thickening of the wall in cecum and proximal right colon. Findings consistent with improving mild colitis. Normal appendix. The terminal ileum is unremarkable. No distal colonic obstruction. 2. No hydronephrosis or hydroureter. 3. Mild fatty infiltration of the liver. 4. Postsurgical changes lumbar spine with fusion at L3-L4 level. 5. No small bowel obstruction. 6. Nonspecific mild thickening of urinary bladder wall. Mild cystitis cannot be excluded. Electronically Signed   By: Lahoma Crocker M.D.   On: 11/06/2015 16:44   Ct Abdomen Pelvis W Contrast  Result Date: 11/02/2015 CLINICAL DATA:  Right mid to lower quadrant pain since last night. Previous lumbar spine surgery. EXAM: CT ABDOMEN AND PELVIS WITH CONTRAST TECHNIQUE: Multidetector CT imaging of the abdomen and pelvis was performed using the standard protocol following bolus administration of intravenous contrast. CONTRAST:  63m ISOVUE-300 IOPAMIDOL (ISOVUE-300) INJECTION 61%, 1025mISOVUE-300 IOPAMIDOL (ISOVUE-300) INJECTION 61% COMPARISON:  04/16/2014 FINDINGS: Lower chest: Within normal. Hepatobiliary: Possible  tiny single gallstone. The liver and biliary tree are otherwise unremarkable. Pancreas: Within normal. Spleen: Within normal. Adrenals/Urinary Tract: Adrenal glands are normal. Kidneys normal in size without hydronephrosis or nephrolithiasis. Ureters and bladder are within normal. Stomach/Bowel: Mild wall thickening along the greater curvature of the stomach without significant change measuring approximately 2 cm. Findings may be due to underdistention although could be seen with gastritis. Small bowel is within normal. The appendix is within normal. There is moderate wall thickening with minimal adjacent inflammatory change involving the ascending colon compatible acute colitis. No evidence of perforation. Vascular/Lymphatic: Within normal. Reproductive: Within normal. Other: Stable 1.6 cm calcified oval structure over the midline pelvis likely diverticula of the rectosigmoid colon. Musculoskeletal: Degenerative changes spine. Posterior fusion hardware intact at the L3-4 level. IMPRESSION: Wall thickening with subtle adjacent inflammatory change involving the ascending colon compatible an acute colitis likely of infectious or inflammatory nature. Single gallstone. Possible wall thickening of the greater curvature of the stomach unchanged and may be due to underdistention versus gastritis. Electronically Signed   By: DaMarin Olp.D.   On: 11/02/2015 19:14

## 2015-11-27 NOTE — Assessment & Plan Note (Signed)
50 year old gentleman with recent acute onset vomiting, diarrhea, right lower quadrant pain. CT revealed findings consistent with acute colitis of the ascending colon I cleaned infectious versus inflammatory. Also with stable possible wall thickening of the greater curvature of the stomach unchanged and may be due to under distention versus gastritis compared to 2016 CT.  Patient presented with similar symptoms back in 2009, colonoscopy as outlined above. Questionable infectious versus ischemic versus NSAID induced process. Patient tells me he's been completely asymptomatic up until last month when the symptoms began. Would favor infectious process given clinical scenario. Patient has had improvement in bowel function but some residual right lower quadrant pain. Continue supportive measures for now. He may require additional evaluation via colonoscopy, will discuss with Dr. Darrick Pennafields.

## 2015-11-27 NOTE — Progress Notes (Signed)
cc'ed to pcp °

## 2015-12-02 ENCOUNTER — Ambulatory Visit (INDEPENDENT_AMBULATORY_CARE_PROVIDER_SITE_OTHER): Payer: Medicare Other | Admitting: Family

## 2015-12-02 ENCOUNTER — Encounter: Payer: Self-pay | Admitting: Family

## 2015-12-02 DIAGNOSIS — G8929 Other chronic pain: Secondary | ICD-10-CM | POA: Diagnosis not present

## 2015-12-02 DIAGNOSIS — F1120 Opioid dependence, uncomplicated: Secondary | ICD-10-CM | POA: Diagnosis not present

## 2015-12-02 DIAGNOSIS — I6350 Cerebral infarction due to unspecified occlusion or stenosis of unspecified cerebral artery: Secondary | ICD-10-CM | POA: Diagnosis not present

## 2015-12-02 DIAGNOSIS — M545 Low back pain: Secondary | ICD-10-CM

## 2015-12-02 DIAGNOSIS — Z0289 Encounter for other administrative examinations: Secondary | ICD-10-CM

## 2015-12-02 MED ORDER — HYDROCODONE-ACETAMINOPHEN 10-325 MG PO TABS: 1.0000 | ORAL_TABLET | Freq: Three times a day (TID) | ORAL | 0 refills | Status: DC

## 2015-12-02 MED ORDER — HYDROCODONE-ACETAMINOPHEN 10-325 MG PO TABS: 1.0000 | ORAL_TABLET | Freq: Three times a day (TID) | ORAL | 0 refills | Status: DC | PRN

## 2015-12-02 NOTE — Progress Notes (Signed)
Subjective:    Patient ID: Charles Pennington, male    DOB: 23-Jul-1965, 50 y.o.   MRN: [id number]  HPI Pt presents to the office today for another rx of pain medication. Pt was seen last month and given one month supply until drug screen was returned. PT's drug screen was expected. Pt did not have xanax in drug scree, but only takes medication as needed. PT did have alcohol with glucose present. Pt is an uncontrolled diabetic. PT states he does not drink alcohol regularly. PT is scheduled for lower back surgery on 12/24/15. Pt states he continues to have constant aching pain 10 out 10.    Review of Systems  Musculoskeletal: Positive for arthralgias and back pain.  All other systems reviewed and are negative.      Objective:   Physical Exam  Constitutional: He is oriented to person, place, and time. He appears well-developed and well-nourished. No distress.  HENT:  Head: Normocephalic.  Eyes: Pupils are equal, round, and reactive to light. Right eye exhibits no discharge. Left eye exhibits no discharge.  Neck: Normal range of motion. Neck supple. No thyromegaly present.  Cardiovascular: Normal rate, regular rhythm, normal heart sounds and intact distal pulses.   No murmur heard. Pulmonary/Chest: Effort normal and breath sounds normal. No respiratory distress. He has no wheezes.  Abdominal: Soft. Bowel sounds are normal. He exhibits no distension. There is no tenderness.  Musculoskeletal: He exhibits edema. He exhibits no tenderness.  Lower back pain  Neurological: He is alert and oriented to person, place, and time.  Skin: Skin is warm and dry. No rash noted. No erythema.  Psychiatric: He has a normal mood and affect. His behavior is normal. Judgment and thought content normal.  Vitals reviewed.   BP 113/79   Pulse (!) 115   Temp 97.5 F (36.4 C) (Oral)   Ht 5\' 8"  (1.727 m)   Wt 230 lb (104.3 kg)   BMI 34.97 kg/m        Assessment & Plan:  1. Chronic bilateral low back  pain, with sciatica presence unspecified - ToxASSURE Select 13 (MW), Urine - HYDROcodone-acetaminophen (NORCO) 10-325 MG tablet; Take 1 tablet by mouth every 8 (eight) hours.  Dispense: 90 tablet; Refill: 0 - HYDROcodone-acetaminophen (NORCO) 10-325 MG tablet; Take 1 tablet by mouth every 8 (eight) hours as needed.  Dispense: 90 tablet; Refill: 0 - HYDROcodone-acetaminophen (NORCO) 10-325 MG tablet; Take 1 tablet by mouth every 8 (eight) hours as needed.  Dispense: 90 tablet; Refill: 0  2. Pain medication agreement signed - ToxASSURE Select 13 (MW), Urine - HYDROcodone-acetaminophen (NORCO) 10-325 MG tablet; Take 1 tablet by mouth every 8 (eight) hours.  Dispense: 90 tablet; Refill: 0 - HYDROcodone-acetaminophen (NORCO) 10-325 MG tablet; Take 1 tablet by mouth every 8 (eight) hours as needed.  Dispense: 90 tablet; Refill: 0 - HYDROcodone-acetaminophen (NORCO) 10-325 MG tablet; Take 1 tablet by mouth every 8 (eight) hours as needed.  Dispense: 90 tablet; Refill: 0  3. Uncomplicated opioid dependence (HCC) - ToxASSURE Select 13 (MW), Urine - HYDROcodone-acetaminophen (NORCO) 10-325 MG tablet; Take 1 tablet by mouth every 8 (eight) hours.  Dispense: 90 tablet; Refill: 0 - HYDROcodone-acetaminophen (NORCO) 10-325 MG tablet; Take 1 tablet by mouth every 8 (eight) hours as needed.  Dispense: 90 tablet; Refill: 0 - HYDROcodone-acetaminophen (NORCO) 10-325 MG tablet; Take 1 tablet by mouth every 8 (eight) hours as needed.  Dispense: 90 tablet; Refill: 0  RTO in 3 months Keep Ortho appt Discussed pt  needs to decided between xanax and pain medication   Jannifer Rodneyhristy Chuck Caban, FNP

## 2015-12-02 NOTE — Patient Instructions (Signed)

## 2015-12-03 ENCOUNTER — Other Ambulatory Visit (INDEPENDENT_AMBULATORY_CARE_PROVIDER_SITE_OTHER): Payer: Medicare Other

## 2015-12-03 ENCOUNTER — Other Ambulatory Visit: Payer: Self-pay | Admitting: Family

## 2015-12-03 ENCOUNTER — Other Ambulatory Visit: Payer: Self-pay | Admitting: Pharmacist

## 2015-12-07 NOTE — Progress Notes (Addendum)
Please let patient know, Dr. Darrick Pennafields reviewed his CT scan, suspected infectious colitis. No additional workup needed at this time unless he has ongoing/recurrent symptoms. He should let us know if that is the case. Otherwise, consider next colonoscopy 2019, 10 years from his previous one. Please NIC.

## 2015-12-09 ENCOUNTER — Other Ambulatory Visit: Payer: Self-pay | Admitting: Family

## 2015-12-09 LAB — TOXASSURE SELECT 13 (MW), URINE

## 2015-12-11 ENCOUNTER — Encounter: Payer: Self-pay | Admitting: Vascular Surgery

## 2015-12-12 ENCOUNTER — Other Ambulatory Visit: Payer: Self-pay | Admitting: Family

## 2015-12-12 DIAGNOSIS — M5440 Lumbago with sciatica, unspecified side: Principal | ICD-10-CM

## 2015-12-12 DIAGNOSIS — F1410 Cocaine abuse, uncomplicated: Secondary | ICD-10-CM | POA: Insufficient documentation

## 2015-12-12 DIAGNOSIS — Z9114 Patient's other noncompliance with medication regimen: Secondary | ICD-10-CM | POA: Insufficient documentation

## 2015-12-12 DIAGNOSIS — Z91148 Patient's other noncompliance with medication regimen for other reason: Secondary | ICD-10-CM

## 2015-12-12 DIAGNOSIS — G8929 Other chronic pain: Secondary | ICD-10-CM

## 2015-12-12 NOTE — Progress Notes (Signed)
PT is aware. York SpanielSaid he is doing great now. Will call if needed. [facility]

## 2015-12-12 NOTE — Progress Notes (Signed)
ON RECALL  °

## 2015-12-15 ENCOUNTER — Inpatient Hospital Stay (HOSPITAL_COMMUNITY): Admission: RE | Admit: 2015-12-15 | Payer: Medicare Other | Source: Ambulatory Visit

## 2015-12-15 DIAGNOSIS — M5136 Other intervertebral disc degeneration, lumbar region: Secondary | ICD-10-CM | POA: Diagnosis not present

## 2015-12-16 ENCOUNTER — Encounter: Payer: Medicare Other | Admitting: Vascular Surgery

## 2015-12-22 ENCOUNTER — Inpatient Hospital Stay (HOSPITAL_COMMUNITY)
Admission: RE | Admit: 2015-12-22 | Discharge: 2015-12-22 | Disposition: A | Discharge status: Home / Self Care | Payer: Medicare Other | Source: Ambulatory Visit

## 2015-12-22 NOTE — Pre-Procedure Instructions (Addendum)
Elbert EwingsMichael T Sawa  12/22/2015    [facility] Admitting at 6:30 AM               Your surgery or procedure is scheduled for 8:30 A   Call this number if you have problems the morning of surgery: [phone]                 For any other questions, please call [phone], Monday - Friday 8 AM - 4 PM.  Remember:  Do not eat food or drink liquids after midnight Tuesday, November, 7            Take these medicines the morning of surgery with A SIP OF WATER : amLODipine (NORVASC),   ,  levothyroxine (SYNTHROID, LEVOTHROID), LYRICA, omeprazole (PRILOSEC), PARoxetine (PAXIL).  May us ProAIr and bring it to the hospital with you.                      DO NOT take canagliflozin [facility](INVOKANA) the morning of surgery.       Take if needed:   , ALPRAZolam Prudy Feeler(XANAX).             Stop taking Aspirin,  and herbal medications do  not take any NSAIDS ie:  Ibuprofen, Advil, Naproxen, Vitamins. .              How to Manage Your Diabetes Before and After Surgery  Why is it important to control my blood sugar before and after surgery? . Improving blood sugar levels before and after surgery helps healing and can limit problems. . A way of improving blood sugar control is eating a healthy diet by: o  Eating less sugar and carbohydrates o  Increasing activity/exercise o  Talking with your doctor about reaching your blood sugar goals . High blood sugars (greater than 180 mg/dL) can raise your risk of infections and slow your recovery, so you will need to focus on controlling your diabetes during the weeks before surgery. . Make sure that the doctor who takes care of your diabetes knows about your planned surgery including the date and location.  How do I manage my blood sugar before surgery? . Check your blood sugar at least 4 times a day, starting 2 days before surgery, to make sure that the level is not too high or low. o Check your blood sugar the morning of your surgery when you wake  up and every 2 hours until you get to the Short Stay unit. . If your blood sugar is less than 70 mg/dL, you will need to treat for low blood sugar: o Do not take insulin. o Treat a low blood sugar (less than 70 mg/dL) with  cup of clear juice (cranberry or apple), 4 glucose tablets, OR glucose gel. o Recheck blood sugar in 15 minutes after treatment (to make sure it is greater than 70 mg/dL). If your blood sugar is not greater than 70 mg/dL on recheck, call [phone] for further instructions. . Report your blood sugar to the short stay nurse when you get to Short Stay.  . If you are admitted to the hospital after surgery: o Your blood sugar will be checked by the staff and you will probably be given insulin after surgery (instead of oral diabetes medicines) to make sure you have good blood sugar levels. o The goal for blood sugar control after surgery is 80-180 mg/dL   WHAT DO I DO ABOUT  MY DIABETES MEDICATION?  Marland Kitchen Do not take oral diabetes medicines (pills) the morning of surgery.(invokana)  . THE NIGHT BEFORE SURGERY, take 40-45 units of tresiba insulin if needed. .  . THE MORNING OF SURGERY, take 40-45 units of tresiba insulin. If needed  . The day of surgery, do not take other diabetes injectables, including Byetta (exenatide), Bydureon (exenatide ER), Victoza (liraglutide), or Trulicity (dulaglutide).  . If your CBG is greater than 220 mg/dL, you may take  of your sliding scale (correction) dose of insulin.    Do not wear jewelry, make-up or nail polish.  Do not wear lotions, powders, or perfumes, or deodorant.   Men may shave face and neck.  Do not bring valuables to the hospital.  [facility] is not responsible for any belongings or valuables.  Contacts, dentures or bridgework may not be worn into surgery.  Leave your suitcase in the car.  After surgery it may be brought to your room.  For patients admitted to the hospital, discharge time will be determined by your  treatment team.  Special instructions: Special Instructions: [facility] - Preparing for Surgery  Before surgery, you can play an important role.  Because skin is not sterile, your skin needs to be as free of germs as possible.  You can reduce the number of germs on you skin by washing with CHG (chlorahexidine gluconate) soap before surgery.  CHG is an antiseptic cleaner which kills germs and bonds with the skin to continue killing germs even after washing.  Please DO NOT use if you have an allergy to CHG or antibacterial soaps.  If your skin becomes reddened/irritated stop using the CHG and inform your nurse when you arrive at Short Stay.  Do not shave (including legs and underarms) for at least 48 hours prior to the first CHG shower.  You may shave your face.  Please follow these instructions carefully:   1.  Shower with CHG Soap the night before surgery and the morning of Surgery.  2.  If you choose to wash your hair, wash your hair first as usual with your normal shampoo.  3.  After you shampoo, rinse your hair and body thoroughly to remove the Shampoo.  4.  Use CHG as you would any other liquid soap.  You can apply chg directly  to the skin and wash gently with scrungie or a clean washcloth.  5.  Apply the CHG Soap to your body ONLY FROM THE NECK DOWN.  Do not use on open wounds or open sores.  Avoid contact with your eyes ears, mouth and genitals (private parts).  Wash genitals (private parts)       with your normal soap.  6.  Wash thoroughly, paying special attention to the area where your surgery will be performed.  7.  Thoroughly rinse your body with warm water from the neck down.  8.  DO NOT shower/wash with your normal soap after using and rinsing off the CHG Soap.  9.  Pat yourself dry with a clean towel.            10.  Wear clean pajamas.            11.  Place clean sheets on your bed the night of your first shower and do not sleep with pets.  Day of Surgery  Do not apply any  lotions/deodorants the morning of surgery.  Please wear clean clothes to the hospital/surgery center.    Please read over the  fact sheets that you were given.

## 2015-12-24 ENCOUNTER — Inpatient Hospital Stay: Admit: 2015-12-24 | Payer: Medicare Other | Admitting: Orthopedic Surgery

## 2015-12-24 SURGERY — ANTERIOR LUMBAR FUSION 1 LEVEL
Anesthesia: General

## 2016-01-01 ENCOUNTER — Other Ambulatory Visit: Payer: Self-pay | Admitting: Family

## 2016-01-02 ENCOUNTER — Telehealth: Payer: Self-pay | Admitting: Family

## 2016-01-02 NOTE — Telephone Encounter (Signed)
[facility] seen [facility] on 12/02/15.

## 2016-01-06 ENCOUNTER — Ambulatory Visit (INDEPENDENT_AMBULATORY_CARE_PROVIDER_SITE_OTHER): Payer: Medicare Other | Admitting: Family

## 2016-01-06 ENCOUNTER — Encounter: Payer: Self-pay | Admitting: Family

## 2016-01-06 VITALS — BP 134/86 | HR 101 | Temp 98.6°F | Ht 68.0 in | Wt 239.0 lb

## 2016-01-06 DIAGNOSIS — E119 Type 2 diabetes mellitus without complications: Secondary | ICD-10-CM

## 2016-01-06 DIAGNOSIS — K219 Gastro-esophageal reflux disease without esophagitis: Secondary | ICD-10-CM

## 2016-01-06 DIAGNOSIS — F1120 Opioid dependence, uncomplicated: Secondary | ICD-10-CM

## 2016-01-06 DIAGNOSIS — M5440 Lumbago with sciatica, unspecified side: Secondary | ICD-10-CM

## 2016-01-06 DIAGNOSIS — E039 Hypothyroidism, unspecified: Secondary | ICD-10-CM

## 2016-01-06 DIAGNOSIS — F331 Major depressive disorder, recurrent, moderate: Secondary | ICD-10-CM

## 2016-01-06 DIAGNOSIS — E785 Hyperlipidemia, unspecified: Secondary | ICD-10-CM

## 2016-01-06 DIAGNOSIS — I10 Essential (primary) hypertension: Secondary | ICD-10-CM

## 2016-01-06 DIAGNOSIS — Z794 Long term (current) use of insulin: Secondary | ICD-10-CM

## 2016-01-06 DIAGNOSIS — F17200 Nicotine dependence, unspecified, uncomplicated: Secondary | ICD-10-CM

## 2016-01-06 DIAGNOSIS — I69993 Ataxia following unspecified cerebrovascular disease: Secondary | ICD-10-CM

## 2016-01-06 DIAGNOSIS — I6350 Cerebral infarction due to unspecified occlusion or stenosis of unspecified cerebral artery: Secondary | ICD-10-CM

## 2016-01-06 DIAGNOSIS — F411 Generalized anxiety disorder: Secondary | ICD-10-CM

## 2016-01-06 DIAGNOSIS — G8929 Other chronic pain: Secondary | ICD-10-CM

## 2016-01-06 DIAGNOSIS — J4520 Mild intermittent asthma, uncomplicated: Secondary | ICD-10-CM

## 2016-01-06 DIAGNOSIS — E1142 Type 2 diabetes mellitus with diabetic polyneuropathy: Secondary | ICD-10-CM

## 2016-01-06 DIAGNOSIS — G4700 Insomnia, unspecified: Secondary | ICD-10-CM

## 2016-01-06 NOTE — Patient Instructions (Signed)
Diabetes Mellitus and Food It is important for you to manage your blood sugar (glucose) level. Your blood glucose level can be greatly affected by what you eat. Eating healthier foods in the appropriate amounts throughout the day at about the same time each day will help you control your blood glucose level. It can also help slow or prevent worsening of your diabetes mellitus. Healthy eating may even help you improve the level of your blood pressure and reach or maintain a healthy weight. General recommendations for healthful eating and cooking habits include:  Eating meals and snacks regularly. Avoid going long periods of time without eating to lose weight.  Eating a diet that consists mainly of plant-based foods, such as fruits, vegetables, nuts, legumes, and whole grains.  Using low-heat cooking methods, such as baking, instead of high-heat cooking methods, such as deep frying.  Work with your dietitian to make sure you understand how to use the Nutrition Facts information on food labels. How can food affect me? Carbohydrates Carbohydrates affect your blood glucose level more than any other type of food. Your dietitian will help you determine how many carbohydrates to eat at each meal and teach you how to count carbohydrates. Counting carbohydrates is important to keep your blood glucose at a healthy level, especially if you are using insulin or taking certain medicines for diabetes mellitus. Alcohol Alcohol can cause sudden decreases in blood glucose (hypoglycemia), especially if you use insulin or take certain medicines for diabetes mellitus. Hypoglycemia can be a life-threatening condition. Symptoms of hypoglycemia (sleepiness, dizziness, and disorientation) are similar to symptoms of having too much alcohol. If your health care provider has given you approval to drink alcohol, do so in moderation and use the following guidelines:  Women should not have more than one drink per day, and men  should not have more than two drinks per day. One drink is equal to: ? 12 oz of beer. ? 5 oz of wine. ? 1 oz of hard liquor.  Do not drink on an empty stomach.  Keep yourself hydrated. Have water, diet soda, or unsweetened iced tea.  Regular soda, juice, and other mixers might contain a lot of carbohydrates and should be counted.  What foods are not recommended? As you make food choices, it is important to remember that all foods are not the same. Some foods have fewer nutrients per serving than other foods, even though they might have the same number of calories or carbohydrates. It is difficult to get your body what it needs when you eat foods with fewer nutrients. Examples of foods that you should avoid that are high in calories and carbohydrates but low in nutrients include:  Trans fats (most processed foods list trans fats on the Nutrition Facts label).  Regular soda.  Juice.  Candy.  Sweets, such as cake, pie, doughnuts, and cookies.  Fried foods.  What foods can I eat? Eat nutrient-rich foods, which will nourish your body and keep you healthy. The food you should eat also will depend on several factors, including:  The calories you need.  The medicines you take.  Your weight.  Your blood glucose level.  Your blood pressure level.  Your cholesterol level.  You should eat a variety of foods, including:  Protein. ? Lean cuts of meat. ? Proteins low in saturated fats, such as fish, egg whites, and beans. Avoid processed meats.  Fruits and vegetables. ? Fruits and vegetables that may help control blood glucose levels, such as apples,   mangoes, and yams.  Dairy products. ? Choose fat-free or low-fat dairy products, such as milk, yogurt, and cheese.  Grains, bread, pasta, and rice. ? Choose whole grain products, such as multigrain bread, whole oats, and brown rice. These foods may help control blood pressure.  Fats. ? Foods containing healthful fats, such as  nuts, avocado, olive oil, canola oil, and fish.  Does everyone with diabetes mellitus have the same meal plan? Because every person with diabetes mellitus is different, there is not one meal plan that works for everyone. It is very important that you meet with a dietitian who will help you create a meal plan that is just right for you. This information is not intended to replace advice given to you by your health care provider. Make sure you discuss any questions you have with your health care provider. Document Released: 10/29/2004 Document Revised: 07/10/2015 Document Reviewed: 12/29/2012 Elsevier Interactive Patient Education  2017 [employer] Inc.  

## 2016-01-06 NOTE — Progress Notes (Signed)
Subjective:    Patient ID: Charles Pennington, male    DOB: 1965-11-29, 50 y.o.   MRN: [id number]  Pt presents to the office today for chronic follow up. PT states he is rescheduled for back fusion surgery. Pt had a CVA when he turned 31 years. PT states he continue to have "balance issues related to the CVA". PT states this is stable.   Medication Refill  Associated symptoms include a visual change. Pertinent negatives include no coughing, fatigue, headaches, myalgias, nausea or sore throat.  Diabetes  He presents for his follow-up diabetic visit. He has type 2 diabetes mellitus. His disease course has been fluctuating. Hypoglycemia symptoms include nervousness/anxiousness. Pertinent negatives for hypoglycemia include no confusion, dizziness, headaches or sleepiness. Associated symptoms include foot paresthesias and visual change. Pertinent negatives for diabetes include no blurred vision, no fatigue and no foot ulcerations. There are no hypoglycemic complications. Symptoms are stable. Diabetic complications include a CVA and peripheral neuropathy. Pertinent negatives for diabetic complications include no heart disease or nephropathy. Risk factors for coronary artery disease include diabetes mellitus, dyslipidemia, hypertension, male sex and tobacco exposure. Current diabetic treatment includes insulin injections, oral agent (monotherapy) and oral agent (triple therapy). He is compliant with treatment all of the time. His weight is stable. He is following a generally healthy diet. He participates in exercise three times a week. His breakfast blood glucose range is generally 140-180 mg/dl. An ACE inhibitor/angiotensin II receptor blocker is being taken. Eye exam is current.  Hypertension  This is a chronic problem. The current episode started more than 1 year ago. The problem has been resolved since onset. The problem is controlled. Associated symptoms include anxiety. Pertinent negatives include no  blurred vision, headaches, malaise/fatigue, palpitations, peripheral edema or shortness of breath. Risk factors for coronary artery disease include diabetes mellitus, dyslipidemia, male gender, obesity and smoking/tobacco exposure. Past treatments include calcium channel blockers. The current treatment provides mild improvement. Hypertensive end-organ damage includes CVA and a thyroid problem. There is no history of kidney disease, CAD/MI or heart failure. There is no history of sleep apnea.  Hyperlipidemia  This is a chronic problem. The current episode started more than 1 year ago. The problem is controlled. Recent lipid tests were reviewed and are normal. Exacerbating diseases include diabetes, hypothyroidism and obesity. Factors aggravating his hyperlipidemia include smoking. Pertinent negatives include no leg pain, myalgias or shortness of breath. Current antihyperlipidemic treatment includes statins. The current treatment provides significant improvement of lipids. Risk factors for coronary artery disease include diabetes mellitus, dyslipidemia, hypertension, male sex and obesity.  Thyroid Problem  Presents for follow-up visit. Symptoms include anxiety, depressed mood, hoarse voice and visual change. Patient reports no constipation, diarrhea, dry skin, fatigue, leg swelling, nail problem or palpitations. The symptoms have been stable. Past treatments include levothyroxine. The treatment provided significant relief. His past medical history is significant for diabetes and hyperlipidemia. There is no history of heart failure.  Anxiety  Presents for follow-up visit. Symptoms include depressed mood, irritability and nervous/anxious behavior. Patient reports no confusion, dizziness, excessive worry, insomnia, nausea, palpitations or shortness of breath. Symptoms occur rarely. The quality of sleep is good. Nighttime awakenings: none.   His past medical history is significant for anxiety/panic attacks, asthma  and depression. There is no history of arrhythmia, CAD, CHF or fibromyalgia. Past treatments include SSRIs and benzodiazephines.  Gastroesophageal Reflux  He complains of a hoarse voice and wheezing. He reports no belching, no coughing, no heartburn, no nausea, no sore  throat or no tooth decay. This is a chronic problem. The current episode started more than 1 year ago. The problem occurs rarely. The problem has been resolved. Pertinent negatives include no fatigue. Risk factors include caffeine use. He has tried a PPI for the symptoms. The treatment provided significant relief.  Back Pain  This is a chronic problem. The current episode started more than 1 year ago. The problem occurs constantly. The problem has been waxing and waning since onset. The pain is present in the lumbar spine. The quality of the pain is described as aching. The pain is at a severity of 9/10. The pain is moderate. The symptoms are aggravated by bending. Pertinent negatives include no headaches or leg pain. Risk factors include obesity. He has tried analgesics and ice for the symptoms. The treatment provided moderate relief.  Asthma  He complains of hoarse voice and wheezing. There is no cough or shortness of breath. This is a chronic problem. The current episode started more than 1 year ago. The problem has been waxing and waning. Pertinent negatives include no headaches, heartburn, malaise/fatigue, myalgias or sore throat. His symptoms are alleviated by rest and beta-agonist. He reports minimal improvement on treatment. Risk factors for lung disease include smoking/tobacco exposure. His past medical history is significant for asthma.  Depression       The patient presents with depression.  This is a chronic problem.  The current episode started more than 1 year ago.   The onset quality is gradual.   The problem occurs intermittently.  The problem has been waxing and waning since onset.  Associated symptoms include irritable and  decreased interest.  Associated symptoms include no fatigue, does not have insomnia, no myalgias, no headaches and not sad.  Past treatments include SSRIs - Selective serotonin reuptake inhibitors.  Compliance with treatment is good.  Past medical history includes hypothyroidism, thyroid problem, anxiety and depression.   Insomnia  Primary symptoms: difficulty falling asleep, frequent awakening, no malaise/fatigue.  The current episode started more than one year. The onset quality is gradual. The problem occurs intermittently. The problem has been waxing and waning since onset. The symptoms are aggravated by tobacco. Past treatments include medication. The treatment provided moderate relief. PMH includes: depression.  Diabetic Neuropathy Pt currently taking lyrica and states this helping with burning in bilateral feet.     Review of Systems  Constitutional: Positive for irritability. Negative for fatigue and malaise/fatigue.  HENT: Positive for hoarse voice. Negative for sore throat.   Eyes: Negative for blurred vision.  Respiratory: Positive for wheezing. Negative for cough and shortness of breath.   Cardiovascular: Negative.  Negative for palpitations.  Gastrointestinal: Negative.  Negative for constipation, diarrhea, heartburn and nausea.  Endocrine: Negative.   Genitourinary: Negative.   Musculoskeletal: Positive for back pain. Negative for myalgias.  Neurological: Negative.  Negative for dizziness and headaches.  Hematological: Negative.   Psychiatric/Behavioral: Positive for depression. Negative for confusion. The patient is nervous/anxious. The patient does not have insomnia.   All other systems reviewed and are negative.      Objective:   Physical Exam  Constitutional: He is oriented to person, place, and time. He appears well-developed and well-nourished. [name]. No distress.  HENT:  Head: Normocephalic.  Right Ear: External ear normal.  Left Ear: External ear  normal.  Nose: Nose normal.  Mouth/Throat: Oropharynx is clear and moist.  Eyes: Pupils are equal, round, and reactive to light. Right eye exhibits no discharge. Left eye  exhibits no discharge.  Neck: Normal range of motion. Neck supple. No thyromegaly present.  Cardiovascular: Normal rate, regular rhythm, normal heart sounds and intact distal pulses.   No murmur heard. Pulmonary/Chest: Effort normal and breath sounds normal. No respiratory distress. He has no wheezes.  Abdominal: Soft. Bowel sounds are normal. He exhibits no distension. There is no tenderness.  Musculoskeletal: Normal range of motion. He exhibits no edema or tenderness.  Neurological: He is alert and oriented to person, place, and time. He has normal reflexes. No cranial nerve deficit.  Skin: Skin is warm and dry. No rash noted. No erythema.  Psychiatric: He has a normal mood and affect. His behavior is normal. Judgment and thought content normal.  Vitals reviewed.     BP 134/86   Pulse (!) 101   Temp 98.6 F (37 C) (Oral)   Ht 5' 8"  (1.727 m)   Wt 239 lb (108.4 kg)   BMI 36.34 kg/m      Assessment & Plan:  1. Essential hypertension - CMP14+EGFR  2. Intermittent asthma without complication, unspecified asthma severity - CMP14+EGFR  3. Gastroesophageal reflux disease, esophagitis presence not specifie - CMP14+EGFR  4. Diabetic polyneuropathy associated with type 2 diabetes mellitus ([place]) - CMP14+EGFR - Ambulatory referral to Ophthalmology - Bayer DCA Hb A1c Waived  5. Hypothyroidism, unspecified type - CMP14+EGFR  6. Type 2 diabetes mellitus treated with insulin ([place]) - CMP14+EGFR - Ambulatory referral to Ophthalmology - Bayer [place] Hb A1c Waived  7. Ataxia, late effect of cerebrovascular disease - CMP14+EGFR  8. Chronic low back pain with sciatica, sciatica laterality unspecified, unspecified back pain laterality - CMP14+EGFR  9. Moderate episode of recurrent major depressive disorder (HCC) -  CMP14+EGFR  10. GAD (generalized anxiety disorder) - CMP14+EGFR  11. Hyperlipidemia, unspecified hyperlipidemia type - CMP14+EGFR  12. SMOKER - CMP14+EGFR  13. Uncomplicated opioid dependence (HCC) - CMP14+EGFR  14. Morbid obesity (HCC) - CMP14+EGFR  15. Insomnia, unspecified type - CMP14+EGFR   Continue all meds, keep appt with Pain Clinic on 01/22/16 Labs pending Health Maintenance reviewed Diet and exercise encouraged RTO 3 months  Evelina Dun, FNP

## 2016-01-19 ENCOUNTER — Other Ambulatory Visit: Payer: Self-pay | Admitting: Family

## 2016-01-22 DIAGNOSIS — G8921 Chronic pain due to trauma: Secondary | ICD-10-CM | POA: Diagnosis not present

## 2016-01-22 DIAGNOSIS — Z5181 Encounter for therapeutic drug level monitoring: Secondary | ICD-10-CM | POA: Diagnosis not present

## 2016-01-22 DIAGNOSIS — Z79899 Other long term (current) drug therapy: Secondary | ICD-10-CM | POA: Diagnosis not present

## 2016-01-22 DIAGNOSIS — G894 Chronic pain syndrome: Secondary | ICD-10-CM | POA: Diagnosis not present

## 2016-01-22 DIAGNOSIS — M792 Neuralgia and neuritis, unspecified: Secondary | ICD-10-CM | POA: Diagnosis not present

## 2016-01-22 DIAGNOSIS — M961 Postlaminectomy syndrome, not elsewhere classified: Secondary | ICD-10-CM | POA: Diagnosis not present

## 2016-01-28 ENCOUNTER — Ambulatory Visit (INDEPENDENT_AMBULATORY_CARE_PROVIDER_SITE_OTHER): Payer: Medicare Other | Admitting: Family

## 2016-01-28 ENCOUNTER — Encounter: Payer: Self-pay | Admitting: Family

## 2016-01-28 VITALS — BP 126/88 | HR 95 | Temp 97.7°F | Ht 68.0 in | Wt 252.2 lb

## 2016-01-28 DIAGNOSIS — I6350 Cerebral infarction due to unspecified occlusion or stenosis of unspecified cerebral artery: Secondary | ICD-10-CM | POA: Diagnosis not present

## 2016-01-28 DIAGNOSIS — E1142 Type 2 diabetes mellitus with diabetic polyneuropathy: Secondary | ICD-10-CM

## 2016-01-28 DIAGNOSIS — E119 Type 2 diabetes mellitus without complications: Secondary | ICD-10-CM

## 2016-01-28 DIAGNOSIS — Z794 Long term (current) use of insulin: Secondary | ICD-10-CM

## 2016-01-28 LAB — CMP14+EGFR
ALK PHOS: 53 IU/L (ref 39–117)
ALT: 26 IU/L (ref 0–44)
AST: 23 IU/L (ref 0–40)
Albumin/Globulin Ratio: 2.2 (ref 1.2–2.2)
Albumin: 4.3 g/dL (ref 3.5–5.5)
BUN/Creatinine Ratio: 21 — ABNORMAL HIGH (ref 9–20)
BUN: 18 mg/dL (ref 6–24)
Bilirubin Total: 0.3 mg/dL (ref 0.0–1.2)
CO2: 26 mmol/L (ref 18–29)
CREATININE: 0.87 mg/dL (ref 0.76–1.27)
Calcium: 9.5 mg/dL (ref 8.7–10.2)
Chloride: 100 mmol/L (ref 96–106)
GFR calc Af Amer: 117 mL/min/{1.73_m2} (ref >59)
GFR calc non Af Amer: 101 mL/min/{1.73_m2} (ref >59)
GLUCOSE: 118 mg/dL — AB (ref 65–99)
Globulin, Total: 2 g/dL (ref 1.5–4.5)
Potassium: 4 mmol/L (ref 3.5–5.2)
SODIUM: 141 mmol/L (ref 134–144)
Total Protein: 6.3 g/dL (ref 6.0–8.5)

## 2016-01-28 LAB — BAYER DCA HB A1C WAIVED: HB A1C: 9 % — AB (ref <7.0)

## 2016-01-28 MED ORDER — ZOLPIDEM TARTRATE 10 MG PO TABS: 10.0000 mg | ORAL_TABLET | Freq: Every evening | ORAL | 3 refills | Status: DC | PRN

## 2016-01-28 MED ORDER — SITAGLIPTIN PHOSPHATE 100 MG PO TABS: 100.0000 mg | ORAL_TABLET | Freq: Every day | ORAL | 1 refills | Status: DC

## 2016-01-28 MED ORDER — FLUTICASONE FUROATE-VILANTEROL 100-25 MCG/INH IN AEPB: 1.0000 | INHALATION_SPRAY | Freq: Every day | RESPIRATORY_TRACT | 1 refills | Status: DC

## 2016-01-28 NOTE — Progress Notes (Signed)
   Subjective:    Patient ID: Charles Pennington, male    DOB: 11-09-1965, 50 y.o.   MRN: [id number]  PT presents to the office today with uncontrolled DM. PT states he has gained 30 lbs in the last 3 months.  Hyperglycemia  Associated symptoms include a visual change.  Diabetes  He presents for his follow-up diabetic visit. He has type 2 diabetes mellitus. His disease course has been worsening. There are no hypoglycemic associated symptoms. Associated symptoms include blurred vision, foot paresthesias and visual change. Pertinent negatives for diabetes include no foot ulcerations. Symptoms are worsening. Diabetic complications include heart disease and peripheral neuropathy. Pertinent negatives for diabetic complications include no CVA. Risk factors for coronary artery disease include diabetes mellitus, dyslipidemia, family history, male sex, hypertension, obesity, sedentary lifestyle and tobacco exposure. Current diabetic treatment includes oral agent (dual therapy) and insulin injections. He is following a generally unhealthy diet. His breakfast blood glucose range is generally >200 mg/dl. An ACE inhibitor/angiotensin II receptor blocker is being taken. Eye exam is not current.      Review of Systems  Eyes: Positive for blurred vision.  Psychiatric/Behavioral: Positive for agitation.  All other systems reviewed and are negative.      Objective:   Physical Exam  Constitutional: He is oriented to person, place, and time. He appears well-developed and well-nourished. No distress.  HENT:  Head: Normocephalic.  Eyes: Pupils are equal, round, and reactive to light. Right eye exhibits no discharge. Left eye exhibits no discharge.  Neck: Normal range of motion. Neck supple. No thyromegaly present.  Cardiovascular: Normal rate, regular rhythm, normal heart sounds and intact distal pulses.   No murmur heard. Pulmonary/Chest: Effort normal and breath sounds normal. No respiratory distress. He has  no wheezes.  Abdominal: Soft. Bowel sounds are normal. He exhibits no distension. There is no tenderness.  Musculoskeletal: Normal range of motion. He exhibits no edema or tenderness.  Neurological: He is alert and oriented to person, place, and time.  Skin: Skin is warm and dry. No rash noted. No erythema.  Psychiatric: He has a normal mood and affect. His behavior is normal. Judgment and thought content normal.  Vitals reviewed.     BP 126/88   Pulse 95   Temp 97.7 F (36.5 C) (Oral)   Ht '5\' 8"'$  (1.727 m)   Wt 252 lb 3.2 oz (114.4 kg)   BMI 38.35 kg/m      Assessment & Plan:  1. Diabetic polyneuropathy associated with type 2 diabetes mellitus (HCC) - sitaGLIPtin (JANUVIA) 100 MG tablet; Take 1 tablet (100 mg total) by mouth daily.  Dispense: 90 tablet; Refill: 1 - CMP14+EGFR - [facility] DCA Hb A1c Waived  2. Type 2 diabetes mellitus treated with insulin (HCC) - sitaGLIPtin (JANUVIA) 100 MG tablet; Take 1 tablet (100 mg total) by mouth daily.  Dispense: 90 tablet; Refill: 1 - CMP14+EGFR - [facility] DCA Hb A1c Waived  3. Morbid obesity ([place]) - CMP14+EGFR  PT started on on Januvia today Continue medications Low carb diet RTO in 1 month   Evelina Dun, FNP

## 2016-01-28 NOTE — Patient Instructions (Signed)

## 2016-01-30 ENCOUNTER — Telehealth: Payer: Self-pay | Admitting: Family

## 2016-01-30 NOTE — Telephone Encounter (Signed)
Will have to wait on christy to come back.

## 2016-01-30 NOTE — Telephone Encounter (Signed)
Pt is having back pain Christy mentioned steroids Can he get RX for steroids Please advise

## 2016-02-02 ENCOUNTER — Other Ambulatory Visit: Payer: Self-pay | Admitting: Family

## 2016-02-02 MED ORDER — PREDNISONE 10 MG (21) PO TBPK: ORAL_TABLET | ORAL | 0 refills | Status: DC

## 2016-02-02 NOTE — Telephone Encounter (Signed)
Pt aware Rx sent to pharmacy 

## 2016-02-02 NOTE — Telephone Encounter (Signed)
Prednisone Prescription sent to pharmacy   

## 2016-02-03 ENCOUNTER — Other Ambulatory Visit: Payer: Self-pay | Admitting: Family

## 2016-02-03 DIAGNOSIS — Z794 Long term (current) use of insulin: Secondary | ICD-10-CM

## 2016-02-03 DIAGNOSIS — E1142 Type 2 diabetes mellitus with diabetic polyneuropathy: Secondary | ICD-10-CM

## 2016-02-03 DIAGNOSIS — E119 Type 2 diabetes mellitus without complications: Secondary | ICD-10-CM

## 2016-02-12 ENCOUNTER — Telehealth: Payer: Self-pay | Admitting: Family

## 2016-02-12 DIAGNOSIS — G894 Chronic pain syndrome: Secondary | ICD-10-CM | POA: Diagnosis not present

## 2016-02-12 DIAGNOSIS — M961 Postlaminectomy syndrome, not elsewhere classified: Secondary | ICD-10-CM | POA: Diagnosis not present

## 2016-02-12 DIAGNOSIS — M792 Neuralgia and neuritis, unspecified: Secondary | ICD-10-CM | POA: Diagnosis not present

## 2016-02-12 NOTE — Telephone Encounter (Signed)
Patient states he was seen at the pain clinic and they said they would not prescribe Xanax and he would like Christy to write an rx for them. Patient was last seen 12/13. Please advise.

## 2016-02-12 NOTE — Telephone Encounter (Signed)
Pt notified Jannifer RodneyChristy [name] will no longer prescribe Xanax Pt verbalizes understanding

## 2016-02-12 NOTE — Telephone Encounter (Signed)
We can not prescribe pt's xanax. Pt has had several positive drug screens. Sorry, but our office policy.

## 2016-03-01 DIAGNOSIS — M961 Postlaminectomy syndrome, not elsewhere classified: Secondary | ICD-10-CM | POA: Diagnosis not present

## 2016-03-01 DIAGNOSIS — M5136 Other intervertebral disc degeneration, lumbar region: Secondary | ICD-10-CM | POA: Diagnosis not present

## 2016-03-11 ENCOUNTER — Other Ambulatory Visit: Payer: Self-pay | Admitting: Family

## 2016-03-11 DIAGNOSIS — G8921 Chronic pain due to trauma: Secondary | ICD-10-CM | POA: Diagnosis not present

## 2016-03-11 DIAGNOSIS — E1121 Type 2 diabetes mellitus with diabetic nephropathy: Secondary | ICD-10-CM | POA: Diagnosis not present

## 2016-03-11 DIAGNOSIS — G894 Chronic pain syndrome: Secondary | ICD-10-CM | POA: Diagnosis not present

## 2016-03-11 DIAGNOSIS — M792 Neuralgia and neuritis, unspecified: Secondary | ICD-10-CM | POA: Diagnosis not present

## 2016-03-11 DIAGNOSIS — M961 Postlaminectomy syndrome, not elsewhere classified: Secondary | ICD-10-CM | POA: Diagnosis not present

## 2016-03-17 ENCOUNTER — Other Ambulatory Visit: Payer: Self-pay | Admitting: Family

## 2016-03-25 ENCOUNTER — Other Ambulatory Visit: Payer: Self-pay | Admitting: Family

## 2016-03-25 DIAGNOSIS — J449 Chronic obstructive pulmonary disease, unspecified: Secondary | ICD-10-CM

## 2016-04-04 ENCOUNTER — Encounter (HOSPITAL_COMMUNITY): Payer: Self-pay | Admitting: *Deleted

## 2016-04-04 ENCOUNTER — Emergency Department (HOSPITAL_COMMUNITY): Payer: Medicare Other

## 2016-04-04 ENCOUNTER — Observation Stay (HOSPITAL_COMMUNITY)
Admission: EM | Admit: 2016-04-04 | Discharge: 2016-04-05 | Disposition: A | Discharge status: Home / Self Care | Payer: Medicare Other | Attending: General Surgery | Admitting: General Surgery

## 2016-04-04 DIAGNOSIS — J984 Other disorders of lung: Secondary | ICD-10-CM | POA: Diagnosis not present

## 2016-04-04 DIAGNOSIS — Z23 Encounter for immunization: Secondary | ICD-10-CM | POA: Diagnosis not present

## 2016-04-04 DIAGNOSIS — F411 Generalized anxiety disorder: Secondary | ICD-10-CM | POA: Diagnosis not present

## 2016-04-04 DIAGNOSIS — S6992XA Unspecified injury of left wrist, hand and finger(s), initial encounter: Secondary | ICD-10-CM | POA: Diagnosis not present

## 2016-04-04 DIAGNOSIS — S0181XA Laceration without foreign body of other part of head, initial encounter: Secondary | ICD-10-CM | POA: Diagnosis not present

## 2016-04-04 DIAGNOSIS — S0990XA Unspecified injury of head, initial encounter: Secondary | ICD-10-CM | POA: Diagnosis not present

## 2016-04-04 DIAGNOSIS — S3993XA Unspecified injury of pelvis, initial encounter: Secondary | ICD-10-CM | POA: Diagnosis not present

## 2016-04-04 DIAGNOSIS — T07XXXA Unspecified multiple injuries, initial encounter: Secondary | ICD-10-CM | POA: Diagnosis present

## 2016-04-04 DIAGNOSIS — S62310A Displaced fracture of base of second metacarpal bone, right hand, initial encounter for closed fracture: Secondary | ICD-10-CM | POA: Insufficient documentation

## 2016-04-04 DIAGNOSIS — S31821A Laceration without foreign body of left buttock, initial encounter: Secondary | ICD-10-CM | POA: Insufficient documentation

## 2016-04-04 DIAGNOSIS — E039 Hypothyroidism, unspecified: Secondary | ICD-10-CM | POA: Diagnosis not present

## 2016-04-04 DIAGNOSIS — S0101XA Laceration without foreign body of scalp, initial encounter: Secondary | ICD-10-CM | POA: Insufficient documentation

## 2016-04-04 DIAGNOSIS — Y92410 Unspecified street and highway as the place of occurrence of the external cause: Secondary | ICD-10-CM | POA: Insufficient documentation

## 2016-04-04 DIAGNOSIS — S62511A Displaced fracture of proximal phalanx of right thumb, initial encounter for closed fracture: Secondary | ICD-10-CM | POA: Insufficient documentation

## 2016-04-04 DIAGNOSIS — E1140 Type 2 diabetes mellitus with diabetic neuropathy, unspecified: Secondary | ICD-10-CM | POA: Insufficient documentation

## 2016-04-04 DIAGNOSIS — K429 Umbilical hernia without obstruction or gangrene: Secondary | ICD-10-CM | POA: Diagnosis not present

## 2016-04-04 DIAGNOSIS — S7002XA Contusion of left hip, initial encounter: Secondary | ICD-10-CM | POA: Diagnosis not present

## 2016-04-04 DIAGNOSIS — Z8673 Personal history of transient ischemic attack (TIA), and cerebral infarction without residual deficits: Secondary | ICD-10-CM | POA: Insufficient documentation

## 2016-04-04 DIAGNOSIS — F329 Major depressive disorder, single episode, unspecified: Secondary | ICD-10-CM | POA: Insufficient documentation

## 2016-04-04 DIAGNOSIS — Z794 Long term (current) use of insulin: Secondary | ICD-10-CM | POA: Insufficient documentation

## 2016-04-04 DIAGNOSIS — K219 Gastro-esophageal reflux disease without esophagitis: Secondary | ICD-10-CM | POA: Diagnosis not present

## 2016-04-04 DIAGNOSIS — E785 Hyperlipidemia, unspecified: Secondary | ICD-10-CM | POA: Diagnosis not present

## 2016-04-04 DIAGNOSIS — J45909 Unspecified asthma, uncomplicated: Secondary | ICD-10-CM | POA: Insufficient documentation

## 2016-04-04 DIAGNOSIS — S066X9A Traumatic subarachnoid hemorrhage with loss of consciousness of unspecified duration, initial encounter: Principal | ICD-10-CM | POA: Insufficient documentation

## 2016-04-04 DIAGNOSIS — M5382 Other specified dorsopathies, cervical region: Secondary | ICD-10-CM | POA: Insufficient documentation

## 2016-04-04 DIAGNOSIS — S3991XA Unspecified injury of abdomen, initial encounter: Secondary | ICD-10-CM | POA: Diagnosis not present

## 2016-04-04 DIAGNOSIS — R918 Other nonspecific abnormal finding of lung field: Secondary | ICD-10-CM | POA: Diagnosis not present

## 2016-04-04 DIAGNOSIS — F1410 Cocaine abuse, uncomplicated: Secondary | ICD-10-CM | POA: Diagnosis not present

## 2016-04-04 DIAGNOSIS — I10 Essential (primary) hypertension: Secondary | ICD-10-CM | POA: Diagnosis not present

## 2016-04-04 DIAGNOSIS — Z6834 Body mass index (BMI) 34.0-34.9, adult: Secondary | ICD-10-CM | POA: Insufficient documentation

## 2016-04-04 DIAGNOSIS — F1120 Opioid dependence, uncomplicated: Secondary | ICD-10-CM | POA: Insufficient documentation

## 2016-04-04 DIAGNOSIS — I609 Nontraumatic subarachnoid hemorrhage, unspecified: Secondary | ICD-10-CM

## 2016-04-04 DIAGNOSIS — S71112A Laceration without foreign body, left thigh, initial encounter: Secondary | ICD-10-CM | POA: Diagnosis not present

## 2016-04-04 DIAGNOSIS — F17200 Nicotine dependence, unspecified, uncomplicated: Secondary | ICD-10-CM | POA: Insufficient documentation

## 2016-04-04 DIAGNOSIS — E119 Type 2 diabetes mellitus without complications: Secondary | ICD-10-CM | POA: Diagnosis not present

## 2016-04-04 DIAGNOSIS — K760 Fatty (change of) liver, not elsewhere classified: Secondary | ICD-10-CM | POA: Insufficient documentation

## 2016-04-04 DIAGNOSIS — S0100XA Unspecified open wound of scalp, initial encounter: Secondary | ICD-10-CM | POA: Diagnosis not present

## 2016-04-04 DIAGNOSIS — S199XXA Unspecified injury of neck, initial encounter: Secondary | ICD-10-CM | POA: Diagnosis not present

## 2016-04-04 DIAGNOSIS — S62514A Nondisplaced fracture of proximal phalanx of right thumb, initial encounter for closed fracture: Secondary | ICD-10-CM | POA: Diagnosis not present

## 2016-04-04 DIAGNOSIS — S299XXA Unspecified injury of thorax, initial encounter: Secondary | ICD-10-CM | POA: Diagnosis not present

## 2016-04-04 DIAGNOSIS — S098XXA Other specified injuries of head, initial encounter: Secondary | ICD-10-CM | POA: Diagnosis not present

## 2016-04-04 HISTORY — DX: Type 2 diabetes mellitus without complications: E11.9

## 2016-04-04 LAB — COMPREHENSIVE METABOLIC PANEL
ALT: 23 U/L (ref 17–63)
ANION GAP: 8 (ref 5–15)
AST: 32 U/L (ref 15–41)
Albumin: 3.9 g/dL (ref 3.5–5.0)
Alkaline Phosphatase: 35 U/L — ABNORMAL LOW (ref 38–126)
BILIRUBIN TOTAL: 0.6 mg/dL (ref 0.3–1.2)
BUN: 24 mg/dL — ABNORMAL HIGH (ref 6–20)
CO2: 20 mmol/L — ABNORMAL LOW (ref 22–32)
Calcium: 9.1 mg/dL (ref 8.9–10.3)
Chloride: 106 mmol/L (ref 101–111)
Creatinine, Ser: 1.25 mg/dL — ABNORMAL HIGH (ref 0.61–1.24)
Glucose, Bld: 216 mg/dL — ABNORMAL HIGH (ref 65–99)
POTASSIUM: 3.9 mmol/L (ref 3.5–5.1)
Sodium: 134 mmol/L — ABNORMAL LOW (ref 135–145)
TOTAL PROTEIN: 6.3 g/dL — AB (ref 6.5–8.1)

## 2016-04-04 LAB — PROTIME-INR
INR: 0.98
PROTHROMBIN TIME: 13 s (ref 11.4–15.2)

## 2016-04-04 LAB — I-STAT CHEM 8, ED
BUN: 28 mg/dL — AB (ref 6–20)
CALCIUM ION: 1.24 mmol/L (ref 1.15–1.40)
Chloride: 107 mmol/L (ref 101–111)
Creatinine, Ser: 1.2 mg/dL (ref 0.61–1.24)
Glucose, Bld: 206 mg/dL — ABNORMAL HIGH (ref 65–99)
HCT: 43 % (ref 39.0–52.0)
HEMOGLOBIN: 14.6 g/dL (ref 13.0–17.0)
Potassium: 4 mmol/L (ref 3.5–5.1)
Sodium: 138 mmol/L (ref 135–145)
TCO2: 22 mmol/L (ref 0–100)

## 2016-04-04 LAB — CBC
HEMATOCRIT: 41.8 % (ref 39.0–52.0)
HEMOGLOBIN: 14.2 g/dL (ref 13.0–17.0)
MCH: 31.1 pg (ref 26.0–34.0)
MCHC: 34 g/dL (ref 30.0–36.0)
MCV: 91.7 fL (ref 78.0–100.0)
Platelets: 266 10*3/uL (ref 150–400)
RBC: 4.56 MIL/uL (ref 4.22–5.81)
RDW: 13.1 % (ref 11.5–15.5)
WBC: 13.1 10*3/uL — AB (ref 4.0–10.5)

## 2016-04-04 LAB — ETHANOL: Alcohol, Ethyl (B): <5 mg/dL (ref <5)

## 2016-04-04 LAB — SAMPLE TO BLOOD BANK

## 2016-04-04 LAB — I-STAT CG4 LACTIC ACID, ED: Lactic Acid, Venous: 0.86 mmol/L (ref 0.5–1.9)

## 2016-04-04 LAB — CBG MONITORING, ED: Glucose-Capillary: 203 mg/dL — ABNORMAL HIGH (ref 65–99)

## 2016-04-04 MED ORDER — ONDANSETRON HCL 4 MG/2ML IJ SOLN
4.0000 mg | Freq: Once | INTRAMUSCULAR | Status: AC
  Administered 2016-04-04: 4 mg via INTRAVENOUS
  Filled 2016-04-04: qty 2

## 2016-04-04 MED ORDER — MORPHINE SULFATE (PF) 4 MG/ML IV SOLN
4.0000 mg | Freq: Once | INTRAVENOUS | Status: AC
  Administered 2016-04-04: 4 mg via INTRAVENOUS
  Filled 2016-04-04: qty 1

## 2016-04-04 MED ORDER — LIDOCAINE-EPINEPHRINE (PF) 2 %-1:200000 IJ SOLN
20.0000 mL | Freq: Once | INTRAMUSCULAR | Status: AC
  Administered 2016-04-04: 20 mL via INTRADERMAL
  Filled 2016-04-04: qty 20

## 2016-04-04 MED ORDER — IOPAMIDOL (ISOVUE-300) INJECTION 61%
INTRAVENOUS | Status: AC
  Administered 2016-04-04: 100 mL
  Filled 2016-04-04: qty 100

## 2016-04-04 MED ORDER — TETANUS-DIPHTH-ACELL PERTUSSIS 5-2.5-18.5 LF-MCG/0.5 IM SUSP
0.5000 mL | Freq: Once | INTRAMUSCULAR | Status: AC
  Administered 2016-04-04: 0.5 mL via INTRAMUSCULAR
  Filled 2016-04-04: qty 0.5

## 2016-04-04 NOTE — ED Notes (Signed)
Report rec'd from [place], [place]
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
## 2016-04-04 NOTE — ED Notes (Signed)
The pt had morphine 4 mg iv by ems on the scene
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
## 2016-04-04 NOTE — ED Notes (Signed)
Pt mother [phone]

## 2016-04-04 NOTE — ED Provider Notes (Signed)
I have personally seen and examined the patient. I have reviewed the documentation on PMH/FH/Soc Hx. I have discussed the plan of care with the resident and patient.  I have reviewed and agree with the resident's documentation. Please see associated encounter note.   EKG Interpretation None         Nira ConnPedro Eduardo Rhianon Zabawa, MD 04/05/16 [phone]

## 2016-04-04 NOTE — ED Notes (Signed)
Dr Andrey Campanilewilson at the bedside

## 2016-04-04 NOTE — ED Notes (Signed)
To ct

## 2016-04-04 NOTE — ED Notes (Signed)
No deficites from previous stroke

## 2016-04-04 NOTE — ED Notes (Signed)
Pt returned from c-t and went right back to xray for plain xrays
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
## 2016-04-04 NOTE — ED Notes (Signed)
Paged Hand Surgery for Dr. Andrey CampanileWilson
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
## 2016-04-04 NOTE — ED Notes (Signed)
The pt remains alert lauighuing joking  Heart rate has increased again  Iv nss running  Had slowed to 99 now back up tp 116-120  All wounds head face  Rt hand and lt buttocks  All washed with soap and saline

## 2016-04-04 NOTE — ED Notes (Signed)
Irrigated patients forehead laceration with of sterile water. Irrigated patients right buttocks laceration with of sterile water. Irrigated and washed both hands and abrasions on hands with sterile water.

## 2016-04-04 NOTE — ED Notes (Signed)
Resident MD at the bedside for suturing procedure

## 2016-04-05 DIAGNOSIS — S31821A Laceration without foreign body of left buttock, initial encounter: Secondary | ICD-10-CM | POA: Diagnosis not present

## 2016-04-05 DIAGNOSIS — S62514A Nondisplaced fracture of proximal phalanx of right thumb, initial encounter for closed fracture: Secondary | ICD-10-CM | POA: Diagnosis not present

## 2016-04-05 DIAGNOSIS — S066X9A Traumatic subarachnoid hemorrhage with loss of consciousness of unspecified duration, initial encounter: Secondary | ICD-10-CM | POA: Diagnosis not present

## 2016-04-05 DIAGNOSIS — S0100XA Unspecified open wound of scalp, initial encounter: Secondary | ICD-10-CM | POA: Diagnosis not present

## 2016-04-05 DIAGNOSIS — E119 Type 2 diabetes mellitus without complications: Secondary | ICD-10-CM | POA: Diagnosis not present

## 2016-04-05 DIAGNOSIS — S62310A Displaced fracture of base of second metacarpal bone, right hand, initial encounter for closed fracture: Secondary | ICD-10-CM | POA: Diagnosis not present

## 2016-04-05 DIAGNOSIS — T07XXXA Unspecified multiple injuries, initial encounter: Secondary | ICD-10-CM | POA: Diagnosis not present

## 2016-04-05 LAB — CBC
HCT: 35.5 % — ABNORMAL LOW (ref 39.0–52.0)
Hemoglobin: 11.8 g/dL — ABNORMAL LOW (ref 13.0–17.0)
MCH: 30.4 pg (ref 26.0–34.0)
MCHC: 33.2 g/dL (ref 30.0–36.0)
MCV: 91.5 fL (ref 78.0–100.0)
PLATELETS: 250 10*3/uL (ref 150–400)
RBC: 3.88 MIL/uL — ABNORMAL LOW (ref 4.22–5.81)
RDW: 13.3 % (ref 11.5–15.5)
WBC: 12.1 10*3/uL — AB (ref 4.0–10.5)

## 2016-04-05 LAB — GLUCOSE, CAPILLARY
Glucose-Capillary: 127 mg/dL — ABNORMAL HIGH (ref 65–99)
Glucose-Capillary: 179 mg/dL — ABNORMAL HIGH (ref 65–99)

## 2016-04-05 LAB — BASIC METABOLIC PANEL
ANION GAP: 7 (ref 5–15)
BUN: 24 mg/dL — ABNORMAL HIGH (ref 6–20)
CALCIUM: 8.5 mg/dL — AB (ref 8.9–10.3)
CO2: 19 mmol/L — ABNORMAL LOW (ref 22–32)
Chloride: 111 mmol/L (ref 101–111)
Creatinine, Ser: 1.03 mg/dL (ref 0.61–1.24)
GFR calc Af Amer: >60 mL/min (ref >60)
GFR calc non Af Amer: >60 mL/min (ref >60)
GLUCOSE: 133 mg/dL — AB (ref 65–99)
Potassium: 3.7 mmol/L (ref 3.5–5.1)
SODIUM: 137 mmol/L (ref 135–145)

## 2016-04-05 LAB — HIV ANTIBODY (ROUTINE TESTING W REFLEX): HIV SCREEN 4TH GENERATION: NONREACTIVE

## 2016-04-05 MED ORDER — PREGABALIN 50 MG PO CAPS
100.0000 mg | ORAL_CAPSULE | Freq: Three times a day (TID) | ORAL | Status: DC
  Administered 2016-04-05: 100 mg via ORAL
  Filled 2016-04-05: qty 2

## 2016-04-05 MED ORDER — SODIUM CHLORIDE 0.9 % IV SOLN: 250.0000 mL | INTRAVENOUS | Status: DC | PRN

## 2016-04-05 MED ORDER — PANTOPRAZOLE SODIUM 40 MG IV SOLR: 40.0000 mg | Freq: Every day | INTRAVENOUS | Status: DC

## 2016-04-05 MED ORDER — FLUTICASONE FUROATE-VILANTEROL 100-25 MCG/INH IN AEPB
1.0000 | INHALATION_SPRAY | Freq: Every day | RESPIRATORY_TRACT | Status: DC
  Administered 2016-04-05 (×2): 1 via RESPIRATORY_TRACT
  Filled 2016-04-05: qty 28

## 2016-04-05 MED ORDER — OXYCODONE HCL 5 MG PO TABS
5.0000 mg | ORAL_TABLET | ORAL | Status: DC | PRN
  Administered 2016-04-05 (×2): 5 mg via ORAL
  Filled 2016-04-05 (×2): qty 1

## 2016-04-05 MED ORDER — SODIUM CHLORIDE 0.9% FLUSH
3.0000 mL | Freq: Two times a day (BID) | INTRAVENOUS | Status: DC
  Administered 2016-04-05: 3 mL via INTRAVENOUS

## 2016-04-05 MED ORDER — TIOTROPIUM BROMIDE MONOHYDRATE 18 MCG IN CAPS
1.0000 | ORAL_CAPSULE | Freq: Every day | RESPIRATORY_TRACT | Status: DC
  Administered 2016-04-05 (×2): 18 ug via RESPIRATORY_TRACT
  Filled 2016-04-05: qty 5

## 2016-04-05 MED ORDER — MORPHINE SULFATE (PF) 2 MG/ML IV SOLN: 1.0000 mg | INTRAVENOUS | Status: DC | PRN

## 2016-04-05 MED ORDER — OXYCODONE HCL 5 MG PO TABS
10.0000 mg | ORAL_TABLET | ORAL | Status: DC | PRN
  Administered 2016-04-05: 10 mg via ORAL
  Filled 2016-04-05: qty 2

## 2016-04-05 MED ORDER — ONDANSETRON HCL 4 MG/2ML IJ SOLN: 4.0000 mg | Freq: Four times a day (QID) | INTRAMUSCULAR | Status: DC | PRN

## 2016-04-05 MED ORDER — PANTOPRAZOLE SODIUM 40 MG PO TBEC
40.0000 mg | DELAYED_RELEASE_TABLET | Freq: Every day | ORAL | Status: DC
  Administered 2016-04-05: 40 mg via ORAL
  Filled 2016-04-05: qty 1

## 2016-04-05 MED ORDER — SODIUM CHLORIDE 0.9% FLUSH
3.0000 mL | INTRAVENOUS | Status: DC | PRN
  Administered 2016-04-05: 3 mL via INTRAVENOUS
  Filled 2016-04-05: qty 3

## 2016-04-05 MED ORDER — ACETAMINOPHEN 325 MG PO TABS
650.0000 mg | ORAL_TABLET | ORAL | Status: DC | PRN
  Administered 2016-04-05: 650 mg via ORAL
  Filled 2016-04-05: qty 2

## 2016-04-05 MED ORDER — POTASSIUM CHLORIDE IN NACL 20-0.9 MEQ/L-% IV SOLN
INTRAVENOUS | Status: DC
  Administered 2016-04-05: 03:00:00 via INTRAVENOUS
  Filled 2016-04-05 (×2): qty 1000

## 2016-04-05 MED ORDER — DOCUSATE SODIUM 100 MG PO CAPS
100.0000 mg | ORAL_CAPSULE | Freq: Two times a day (BID) | ORAL | Status: DC
  Administered 2016-04-05: 100 mg via ORAL
  Filled 2016-04-05: qty 1

## 2016-04-05 MED ORDER — TOPIRAMATE 25 MG PO TABS
50.0000 mg | ORAL_TABLET | Freq: Two times a day (BID) | ORAL | Status: DC
  Administered 2016-04-05: 50 mg via ORAL
  Filled 2016-04-05: qty 2

## 2016-04-05 MED ORDER — INSULIN ASPART 100 UNIT/ML ~~LOC~~ SOLN
0.0000 [IU] | Freq: Three times a day (TID) | SUBCUTANEOUS | Status: DC
  Administered 2016-04-05: 3 [IU] via SUBCUTANEOUS
  Administered 2016-04-05: 4 [IU] via SUBCUTANEOUS

## 2016-04-05 MED ORDER — ONDANSETRON HCL 4 MG PO TABS: 4.0000 mg | ORAL_TABLET | Freq: Four times a day (QID) | ORAL | Status: DC | PRN

## 2016-04-05 NOTE — Progress Notes (Signed)
Student nurse reported that pt removed IV. No bleeding.
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
## 2016-04-05 NOTE — Progress Notes (Signed)
Orthopedic Tech Progress Note Patient Details:  Merdis DelayMichael Farfan [birth year] [id number]  Ortho Devices Type of Ortho Device: Ace wrap, Volar splint, Thumb spica splint Splint Material: Fiberglass Ortho Device/Splint Interventions: Application   Saul FordyceJennifer C Tianah Lonardo 04/05/2016, 1:27 PM

## 2016-04-05 NOTE — Progress Notes (Signed)
Arrived from Ed at 0150. Alert and oriented. C/O of head and rt hand pain 6/10. Medicated for pain. Oriented to room. Left frontal gauze dressing CDI. Lf gluteal dressing intact with old drainage.

## 2016-04-05 NOTE — Progress Notes (Signed)
Pt removed telemetry monitoring stating that the MD told him that he could go home. Pt refusing to put telemetry back on. No discharge order at this time. MD paged. Awaiting call back.

## 2016-04-05 NOTE — Progress Notes (Signed)
Ortho tech called to get splint delivered for RUE per Md order.

## 2016-04-05 NOTE — ED Notes (Signed)
edp at the bedside for suturing 

## 2016-04-05 NOTE — Progress Notes (Signed)
Student nurse reported to this nurse that pt had handful of pills in his hand attempting to take them. Reported to pt room, noted medication on the table all different shapes and sizes encouraged pt not to take medication. Pt agreed the bottle was removed. Pt stated that he needs his medication and wasn't sure if we were going to administer them. He stated, " I need my Lyrica or Im going to go crazy!"  Lyrica scheduled for 10am.  Md notified. No noted distress. Will continue to monitor.

## 2016-04-05 NOTE — Progress Notes (Signed)
Pt left against medical advice. Pt stated that he was not going to wait for the Md to call back to tell him to go home. He stated, " I have to go because I have a ride waiting for me." Pt encouraged not to leave but he still refused to stay and was educated.
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
## 2016-04-05 NOTE — Progress Notes (Signed)
RUE splint delivered to pt room via ortho tech.

## 2016-04-05 NOTE — ED Notes (Signed)
Waiting on resident to complete suturing procedure and will transport pt to floor for admission

## 2016-04-06 ENCOUNTER — Telehealth: Payer: Self-pay | Admitting: *Deleted

## 2016-04-06 ENCOUNTER — Encounter: Payer: Self-pay | Admitting: Family

## 2016-04-06 LAB — HEMOGLOBIN A1C
Hgb A1c MFr Bld: 8.5 % — ABNORMAL HIGH (ref 4.8–5.6)
Mean Plasma Glucose: 197 mg/dL

## 2016-04-06 NOTE — Progress Notes (Signed)
Patient called requiring about which doctor to follow up. Information given.

## 2016-04-06 NOTE — Telephone Encounter (Signed)
Call Completed and Appointment Scheduled: Yes, Date: 04/12/2016 with Evelina Dun   DISCHARGE INFORMATION Date of Discharge:04/05/2016  Discharge Facility: [facility]  Principal Discharge Diagnosis: 04/05/2016  Patient and/or caregiver is knowledgeable of his/her condition(s) and treatment: Yes  MEDICATION RECONCILIATION Current medication list reviewed with patient:Yes  Outpatient Encounter Prescriptions as of 04/06/2016  Medication Sig  . ALPRAZolam (XANAX) 0.5 MG tablet Take 1 tablet (0.5 mg total) by mouth 2 (two) times daily as needed for anxiety.  Marland Kitchen amLODipine (NORVASC) 5 MG tablet TAKE 1 TABLET (5 MG TOTAL) BY MOUTH DAILY.  Marland Kitchen aspirin EC 81 MG tablet Take 1 tablet (81 mg total) by mouth daily.  Marland Kitchen atorvastatin (LIPITOR) 80 MG tablet Take 1 tablet (80 mg total) by mouth daily.  . blood glucose meter kit and supplies KIT Dispense based on patient and insurance preference. Use up to two times daily as directed. (FOR ICD-10 type 2 DM with insulin use E11.9, Z79.4)  . BREO ELLIPTA 100-25 MCG/INH AEPB Inhale 1 puff into the lungs daily.  Marland Kitchen BREO ELLIPTA 100-25 MCG/INH AEPB Inhale 1 puff into the lungs daily.  . fenofibrate 160 MG tablet TAKE 1 TABLET (160 MG TOTAL) BY MOUTH DAILY.  . fluticasone furoate-vilanterol (BREO ELLIPTA) 100-25 MCG/INH AEPB Inhale 1 puff into the lungs daily.  . Glucose Blood (BLOOD GLUCOSE TEST STRIPS) STRP Use to check BG up to BID.  Dx:  250.02  . Insulin Pen Needle 31G X 4 MM MISC 1 application by Does not apply route 2 (two) times daily.  . INVOKANA 300 MG TABS tablet TAKE (1) TABLET DAILY BE- FORE BREAKFAST.  Marland Kitchen JANUVIA 100 MG tablet Take 100 mg by mouth daily.  Marland Kitchen levothyroxine (SYNTHROID, LEVOTHROID) 200 MCG tablet TAKE 1 TABLET (200 MCG TOTAL) BY MOUTH DAILY BEFORE BREAKFAST.  Marland Kitchen lisinopril (PRINIVIL,ZESTRIL) 20 MG tablet Take 1 tablet (20 mg total) by mouth daily.  Marland Kitchen LYRICA 100 MG capsule Take 100 mg by mouth 3 (three) times daily.  Marland Kitchen LYRICA 50 MG capsule Take 50  mg by mouth 3 (three) times daily.  Marland Kitchen omeprazole (PRILOSEC) 20 MG capsule TAKE 1 CAPSULE (20 MG TOTAL) BY MOUTH DAILY.  Marland Kitchen omeprazole (PRILOSEC) 20 MG capsule Take 20 mg by mouth daily.  Marland Kitchen PARoxetine (PAXIL) 40 MG tablet Take 1 tablet (40 mg total) by mouth daily.  . predniSONE (STERAPRED UNI-PAK 21 TAB) 10 MG (21) TBPK tablet Use as directed  . PROAIR HFA 108 (90 Base) MCG/ACT inhaler INHALE 2 PUFFS INTO THE LUNGS EVERY 6 (SIX) HOURS AS NEEDED FOR WHEEZING.  . sitaGLIPtin (JANUVIA) 100 MG tablet Take 1 tablet (100 mg total) by mouth daily.  Marland Kitchen SPIRIVA HANDIHALER 18 MCG inhalation capsule PLACE 1 CAPSULE INTO THE INHALER AND INHALE DAILY  . SPIRIVA HANDIHALER 18 MCG inhalation capsule Place 1 capsule into inhaler and inhale daily.  Marland Kitchen topiramate (TOPAMAX) 50 MG tablet Take 50 mg by mouth 2 (two) times daily.  . TRESIBA FLEXTOUCH 200 UNIT/ML SOPN INJECT 80 TO 90 UNITS SQ DAILY  . TRULICITY 1.5 YT/0.1SW SOPN INJECT 1.5MG SQ ONCE A WEEK  . zolpidem (AMBIEN) 10 MG tablet Take 1 tablet (10 mg total) by mouth at bedtime as needed. for sleep  . zolpidem (AMBIEN) 10 MG tablet Take 10 mg by mouth at bedtime as needed. for sleep   No facility-administered encounter medications on file as of 04/06/2016.     Discharge Medications reviewed and reconciled with current medications.yes  Patient is able to obtain needed medications:Yes  ACTIVITIES OF DAILY LIVING  Is the patient able to perform his/her own ADLs: Yes.    Patient is receiving home health services: No.  PATIENT EDUCATION Questions/Concerns Discussed: none

## 2016-04-07 ENCOUNTER — Telehealth (HOSPITAL_COMMUNITY): Payer: Self-pay

## 2016-04-07 NOTE — Telephone Encounter (Signed)
No answer on patient's phone. Called and spoke with his mother. She states that he needs pain medication. Advised that I cannot prescribe narcotics because he left AMA; recommend taking OTC tylenol and ibuprofen PRN pain.  Patient's mom states that she thinks he has an appointment with his PCP next week. I advised that he also needs to follow-up with trauma to have his sutures removed in about 11 days, and with ortho for recheck on his hand fractures in 4-7 days. She was not sure if he had make these appointments yet. States that she will talk with him and have him call our office back.

## 2016-04-08 DIAGNOSIS — Z5181 Encounter for therapeutic drug level monitoring: Secondary | ICD-10-CM | POA: Diagnosis not present

## 2016-04-08 DIAGNOSIS — M5416 Radiculopathy, lumbar region: Secondary | ICD-10-CM | POA: Diagnosis not present

## 2016-04-08 DIAGNOSIS — G8921 Chronic pain due to trauma: Secondary | ICD-10-CM | POA: Diagnosis not present

## 2016-04-08 DIAGNOSIS — E1121 Type 2 diabetes mellitus with diabetic nephropathy: Secondary | ICD-10-CM | POA: Diagnosis not present

## 2016-04-08 DIAGNOSIS — F17200 Nicotine dependence, unspecified, uncomplicated: Secondary | ICD-10-CM | POA: Diagnosis not present

## 2016-04-08 DIAGNOSIS — M792 Neuralgia and neuritis, unspecified: Secondary | ICD-10-CM | POA: Diagnosis not present

## 2016-04-08 DIAGNOSIS — Z79899 Other long term (current) drug therapy: Secondary | ICD-10-CM | POA: Diagnosis not present

## 2016-04-08 NOTE — Telephone Encounter (Signed)
Spoke with patient - states his pain is controlled, gluteal wound leaking serosanguinous fluid, and he is showering daily. States he is seeing his PCP on Monday 04/12/16.  I am scheduling him for an appointment in trauma clinic Thursday 04/15/16. [facility] office to call him and confirm appointment date/time.

## 2016-04-12 ENCOUNTER — Encounter: Payer: Self-pay | Admitting: Family

## 2016-04-12 ENCOUNTER — Ambulatory Visit (INDEPENDENT_AMBULATORY_CARE_PROVIDER_SITE_OTHER): Payer: Medicare Other | Admitting: Family

## 2016-04-12 DIAGNOSIS — Z09 Encounter for follow-up examination after completed treatment for conditions other than malignant neoplasm: Secondary | ICD-10-CM

## 2016-04-12 DIAGNOSIS — S81812A Laceration without foreign body, left lower leg, initial encounter: Secondary | ICD-10-CM

## 2016-04-12 DIAGNOSIS — J40 Bronchitis, not specified as acute or chronic: Secondary | ICD-10-CM

## 2016-04-12 DIAGNOSIS — S0181XA Laceration without foreign body of other part of head, initial encounter: Secondary | ICD-10-CM

## 2016-04-12 DIAGNOSIS — Z5189 Encounter for other specified aftercare: Secondary | ICD-10-CM | POA: Diagnosis not present

## 2016-04-12 DIAGNOSIS — J0100 Acute maxillary sinusitis, unspecified: Secondary | ICD-10-CM

## 2016-04-12 DIAGNOSIS — S81812D Laceration without foreign body, left lower leg, subsequent encounter: Secondary | ICD-10-CM

## 2016-04-12 MED ORDER — AMOXICILLIN-POT CLAVULANATE 875-125 MG PO TABS: 1.0000 | ORAL_TABLET | Freq: Two times a day (BID) | ORAL | 0 refills | Status: DC

## 2016-04-12 NOTE — Patient Instructions (Signed)
Laceration Care, Adult Introduction A laceration is a cut that goes through all layers of the skin. The cut also goes into the tissue that is right under the skin. Some cuts heal on their own. Others need to be closed with stitches (sutures), staples, skin adhesive strips, or wound glue. Taking care of your cut lowers your risk of infection and helps your cut to heal better. How to take care of your cut For stitches or staples:  Keep the wound clean and dry.  If you were given a bandage (dressing), you should change it at least one time per day or as told by your doctor. You should also change it if it gets wet or dirty.  Keep the wound completely dry for the first 24 hours or as told by your doctor. After that time, you may take a shower or a bath. However, make sure that the wound is not soaked in water until after the stitches or staples have been removed.  Clean the wound one time each day or as told by your doctor:  Wash the wound with soap and water.  Rinse the wound with water until all of the soap comes off.  Pat the wound dry with a clean towel. Do not rub the wound.  After you clean the wound, put a thin layer of antibiotic ointment on it as told by your doctor. This ointment:  Helps to prevent infection.  Keeps the bandage from sticking to the wound.  Have your stitches or staples removed as told by your doctor. If your doctor used skin adhesive strips:  Keep the wound clean and dry.  If you were given a bandage, you should change it at least one time per day or as told by your doctor. You should also change it if it gets dirty or wet.  Do not get the skin adhesive strips wet. You can take a shower or a bath, but be careful to keep the wound dry.  If the wound gets wet, pat it dry with a clean towel. Do not rub the wound.  Skin adhesive strips fall off on their own. You can trim the strips as the wound heals. Do not remove any strips that are still stuck to the wound.  They will fall off after a while. If your doctor used wound glue:  Try to keep your wound dry, but you may briefly wet it in the shower or bath. Do not soak the wound in water, such as by swimming.  After you take a shower or a bath, gently pat the wound dry with a clean towel. Do not rub the wound.  Do not do any activities that will make you really sweaty until the skin glue has fallen off on its own.  Do not apply liquid, cream, or ointment medicine to your wound while the skin glue is still on.  If you were given a bandage, you should change it at least one time per day or as told by your doctor. You should also change it if it gets dirty or wet.  If a bandage is placed over the wound, do not let the tape for the bandage touch the skin glue.  Do not pick at the glue. The skin glue usually stays on for 5-10 days. Then, it falls off of the skin. General Instructions  To help prevent scarring, make sure to cover your wound with sunscreen whenever you are outside after stitches are removed, after adhesive strips are removed,   or when wound glue stays in place and the wound is healed. Make sure to wear a sunscreen of at least 30 SPF.  Take over-the-counter and prescription medicines only as told by your doctor.  If you were given antibiotic medicine or ointment, take or apply it as told by your doctor. Do not stop using the antibiotic even if your wound is getting better.  Do not scratch or pick at the wound.  Keep all follow-up visits as told by your doctor. This is important.  Check your wound every day for signs of infection. Watch for:  Redness, swelling, or pain.  Fluid, blood, or pus.  Raise (elevate) the injured area above the level of your heart while you are sitting or lying down, if possible. Get help if:  You got a tetanus shot and you have any of these problems at the injection site:  Swelling.  Very bad pain.  Redness.  Bleeding.  You have a fever.  A wound  that was closed breaks open.  You notice a bad smell coming from your wound or your bandage.  You notice something coming out of the wound, such as wood or glass.  Medicine does not help your pain.  You have more redness, swelling, or pain at the site of your wound.  You have fluid, blood, or pus coming from your wound.  You notice a change in the color of your skin near your wound.  You need to change the bandage often because fluid, blood, or pus is coming from the wound.  You start to have a new rash.  You start to have numbness around the wound. Get help right away if:  You have very bad swelling around the wound.  Your pain suddenly gets worse and is very bad.  You notice painful lumps near the wound or on skin that is anywhere on your body.  You have a red streak going away from your wound.  The wound is on your hand or foot and you cannot move a finger or toe like you usually can.  The wound is on your hand or foot and you notice that your fingers or toes look pale or bluish. This information is not intended to replace advice given to you by your health care provider. Make sure you discuss any questions you have with your health care provider. Document Released: 07/21/2007 Document Revised: 07/10/2015 Document Reviewed: 01/28/2014  2017 Elsevier  
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
## 2016-04-15 DIAGNOSIS — S62310A Displaced fracture of base of second metacarpal bone, right hand, initial encounter for closed fracture: Secondary | ICD-10-CM | POA: Diagnosis not present

## 2016-04-15 DIAGNOSIS — S0181XA Laceration without foreign body of other part of head, initial encounter: Secondary | ICD-10-CM | POA: Diagnosis not present

## 2016-04-15 DIAGNOSIS — S31801A Laceration without foreign body of unspecified buttock, initial encounter: Secondary | ICD-10-CM | POA: Diagnosis not present

## 2016-04-16 DIAGNOSIS — S62310A Displaced fracture of base of second metacarpal bone, right hand, initial encounter for closed fracture: Secondary | ICD-10-CM | POA: Diagnosis not present

## 2016-04-16 DIAGNOSIS — M19041 Primary osteoarthritis, right hand: Secondary | ICD-10-CM | POA: Diagnosis not present

## 2016-04-16 DIAGNOSIS — Y999 Unspecified external cause status: Secondary | ICD-10-CM | POA: Diagnosis not present

## 2016-04-17 ENCOUNTER — Other Ambulatory Visit: Payer: Self-pay | Admitting: Family

## 2016-04-20 DIAGNOSIS — S62310D Displaced fracture of base of second metacarpal bone, right hand, subsequent encounter for fracture with routine healing: Secondary | ICD-10-CM | POA: Diagnosis not present

## 2016-04-22 ENCOUNTER — Other Ambulatory Visit: Payer: Self-pay | Admitting: Family

## 2016-04-22 ENCOUNTER — Other Ambulatory Visit: Payer: Self-pay | Admitting: Family Medicine

## 2016-04-22 DIAGNOSIS — I10 Essential (primary) hypertension: Secondary | ICD-10-CM

## 2016-04-23 ENCOUNTER — Encounter: Payer: Self-pay | Admitting: Family

## 2016-04-23 ENCOUNTER — Ambulatory Visit (INDEPENDENT_AMBULATORY_CARE_PROVIDER_SITE_OTHER): Payer: Medicare Other | Admitting: Family

## 2016-04-23 VITALS — BP 104/73 | HR 102 | Temp 97.5°F | Ht 69.0 in | Wt 236.0 lb

## 2016-04-23 DIAGNOSIS — S31829S Unspecified open wound of left buttock, sequela: Secondary | ICD-10-CM | POA: Diagnosis not present

## 2016-04-23 DIAGNOSIS — Z5189 Encounter for other specified aftercare: Secondary | ICD-10-CM

## 2016-04-23 MED ORDER — SULFAMETHOXAZOLE-TRIMETHOPRIM 800-160 MG PO TABS: 1.0000 | ORAL_TABLET | Freq: Two times a day (BID) | ORAL | 0 refills | Status: DC

## 2016-04-23 NOTE — Patient Instructions (Signed)
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
## 2016-04-26 ENCOUNTER — Ambulatory Visit (HOSPITAL_COMMUNITY): Payer: Medicare Other | Admitting: Physical Therapy

## 2016-04-27 ENCOUNTER — Other Ambulatory Visit: Payer: Self-pay | Admitting: Family

## 2016-04-27 DIAGNOSIS — E119 Type 2 diabetes mellitus without complications: Secondary | ICD-10-CM

## 2016-04-27 DIAGNOSIS — Z794 Long term (current) use of insulin: Secondary | ICD-10-CM

## 2016-04-27 DIAGNOSIS — I10 Essential (primary) hypertension: Secondary | ICD-10-CM

## 2016-04-27 DIAGNOSIS — E1142 Type 2 diabetes mellitus with diabetic polyneuropathy: Secondary | ICD-10-CM

## 2016-04-28 ENCOUNTER — Ambulatory Visit (HOSPITAL_COMMUNITY): Payer: Medicare Other | Attending: Family | Admitting: Physical Therapy

## 2016-04-28 DIAGNOSIS — X58XXXD Exposure to other specified factors, subsequent encounter: Secondary | ICD-10-CM | POA: Diagnosis not present

## 2016-04-28 DIAGNOSIS — M25552 Pain in left hip: Secondary | ICD-10-CM

## 2016-04-28 DIAGNOSIS — S31821D Laceration without foreign body of left buttock, subsequent encounter: Secondary | ICD-10-CM | POA: Insufficient documentation
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
## 2016-04-30 ENCOUNTER — Ambulatory Visit (HOSPITAL_COMMUNITY): Payer: Medicare Other | Admitting: Physical Therapy

## 2016-04-30 ENCOUNTER — Telehealth (HOSPITAL_COMMUNITY): Payer: Self-pay | Admitting: Physical Therapy

## 2016-04-30 ENCOUNTER — Encounter: Payer: Self-pay | Admitting: Family

## 2016-04-30 ENCOUNTER — Ambulatory Visit (INDEPENDENT_AMBULATORY_CARE_PROVIDER_SITE_OTHER): Payer: Medicare Other | Admitting: Family

## 2016-04-30 VITALS — BP 118/82 | HR 112 | Temp 97.4°F | Ht 69.0 in | Wt 237.0 lb

## 2016-04-30 DIAGNOSIS — S300XXS Contusion of lower back and pelvis, sequela: Secondary | ICD-10-CM | POA: Diagnosis not present

## 2016-04-30 MED ORDER — NAPROXEN 500 MG PO TABS: ORAL_TABLET | ORAL | 0 refills | Status: DC
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
## 2016-04-30 NOTE — Patient Instructions (Signed)
Contusion A contusion is a deep bruise. Contusions are the result of a blunt injury to tissues and muscle fibers under the skin. The injury causes bleeding under the skin. The skin overlying the contusion may turn blue, purple, or yellow. Minor injuries will give you a painless contusion, but more severe contusions may stay painful and swollen for a few weeks. What are the causes? This condition is usually caused by a blow, trauma, or direct force to an area of the body. What are the signs or symptoms? Symptoms of this condition include:  Swelling of the injured area.  Pain and tenderness in the injured area.  Discoloration. The area may have redness and then turn blue, purple, or yellow. How is this diagnosed? This condition is diagnosed based on a physical exam and medical history. An X-ray, CT scan, or MRI may be needed to determine if there are any associated injuries, such as broken bones (fractures). How is this treated? Specific treatment for this condition depends on what area of the body was injured. In general, the best treatment for a contusion is resting, icing, applying pressure to (compression), and elevating the injured area. This is often called the RICE strategy. Over-the-counter anti-inflammatory medicines may also be recommended for pain control. Follow these instructions at home:  Rest the injured area.  If directed, apply ice to the injured area:  Put ice in a plastic bag.  Place a towel between your skin and the bag.  Leave the ice on for 20 minutes, 2-3 times per day.  If directed, apply light compression to the injured area using an elastic bandage. Make sure the bandage is not wrapped too tightly. Remove and reapply the bandage as directed by your health care provider.  If possible, raise (elevate) the injured area above the level of your heart while you are sitting or lying down.  Take over-the-counter and prescription medicines only as told by your health  care provider. Contact a health care provider if:  Your symptoms do not improve after several days of treatment.  Your symptoms get worse.  You have difficulty moving the injured area. Get help right away if:  You have severe pain.  You have numbness in a hand or foot.  Your hand or foot turns pale or cold. This information is not intended to replace advice given to you by your health care provider. Make sure you discuss any questions you have with your health care provider. Document Released: 11/11/2004 Document Revised: 06/12/2015 Document Reviewed: 06/19/2014 Elsevier Interactive Patient Education  2017 [employer]  

## 2016-04-30 NOTE — Telephone Encounter (Signed)
Called pt re missed appointment.  No answer left message.   Cynthia Russell, PT CLT [phone] 

## 2016-05-03 ENCOUNTER — Other Ambulatory Visit: Payer: Self-pay | Admitting: *Deleted

## 2016-05-03 ENCOUNTER — Ambulatory Visit (INDEPENDENT_AMBULATORY_CARE_PROVIDER_SITE_OTHER): Payer: Medicare Other | Admitting: Family

## 2016-05-03 ENCOUNTER — Telehealth: Payer: Self-pay

## 2016-05-03 ENCOUNTER — Telehealth (HOSPITAL_COMMUNITY): Payer: Self-pay | Admitting: Family

## 2016-05-03 ENCOUNTER — Encounter: Payer: Self-pay | Admitting: Family

## 2016-05-03 ENCOUNTER — Ambulatory Visit (HOSPITAL_COMMUNITY): Payer: Medicare Other | Admitting: Physical Therapy

## 2016-05-03 VITALS — BP 148/100 | HR 115 | Temp 98.6°F | Ht 69.0 in | Wt 242.4 lb

## 2016-05-03 DIAGNOSIS — E1142 Type 2 diabetes mellitus with diabetic polyneuropathy: Secondary | ICD-10-CM | POA: Diagnosis not present

## 2016-05-03 DIAGNOSIS — I10 Essential (primary) hypertension: Secondary | ICD-10-CM

## 2016-05-03 DIAGNOSIS — E119 Type 2 diabetes mellitus without complications: Secondary | ICD-10-CM

## 2016-05-03 DIAGNOSIS — K219 Gastro-esophageal reflux disease without esophagitis: Secondary | ICD-10-CM

## 2016-05-03 DIAGNOSIS — G4700 Insomnia, unspecified: Secondary | ICD-10-CM

## 2016-05-03 DIAGNOSIS — M5440 Lumbago with sciatica, unspecified side: Secondary | ICD-10-CM

## 2016-05-03 DIAGNOSIS — F17200 Nicotine dependence, unspecified, uncomplicated: Secondary | ICD-10-CM

## 2016-05-03 DIAGNOSIS — Z794 Long term (current) use of insulin: Secondary | ICD-10-CM | POA: Diagnosis not present

## 2016-05-03 DIAGNOSIS — T07XXXA Unspecified multiple injuries, initial encounter: Secondary | ICD-10-CM

## 2016-05-03 DIAGNOSIS — F331 Major depressive disorder, recurrent, moderate: Secondary | ICD-10-CM

## 2016-05-03 DIAGNOSIS — E039 Hypothyroidism, unspecified: Secondary | ICD-10-CM

## 2016-05-03 DIAGNOSIS — Z8673 Personal history of transient ischemic attack (TIA), and cerebral infarction without residual deficits: Secondary | ICD-10-CM

## 2016-05-03 DIAGNOSIS — F411 Generalized anxiety disorder: Secondary | ICD-10-CM | POA: Diagnosis not present

## 2016-05-03 DIAGNOSIS — J4520 Mild intermittent asthma, uncomplicated: Secondary | ICD-10-CM | POA: Diagnosis not present

## 2016-05-03 DIAGNOSIS — Z5189 Encounter for other specified aftercare: Secondary | ICD-10-CM

## 2016-05-03 DIAGNOSIS — E785 Hyperlipidemia, unspecified: Secondary | ICD-10-CM

## 2016-05-03 DIAGNOSIS — G8929 Other chronic pain: Secondary | ICD-10-CM

## 2016-05-03 LAB — BAYER DCA HB A1C WAIVED: HB A1C (BAYER DCA - WAIVED): 7.3 % — ABNORMAL HIGH (ref <7.0)

## 2016-05-03 NOTE — Telephone Encounter (Signed)
05/03/16 pt came in at 10:07 thinking he had an appt at 10:40.  He said that he wouldn't be able to come back to the 1:00 appt because his mom had a 1:20 drs appt.

## 2016-05-03 NOTE — Telephone Encounter (Signed)
There is no such thing
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
## 2016-05-03 NOTE — Patient Instructions (Signed)

## 2016-05-04 ENCOUNTER — Telehealth: Payer: Self-pay | Admitting: *Deleted

## 2016-05-04 ENCOUNTER — Other Ambulatory Visit: Payer: Self-pay | Admitting: Family

## 2016-05-04 DIAGNOSIS — Z79899 Other long term (current) drug therapy: Secondary | ICD-10-CM | POA: Diagnosis not present

## 2016-05-04 DIAGNOSIS — M542 Cervicalgia: Secondary | ICD-10-CM | POA: Diagnosis not present

## 2016-05-04 DIAGNOSIS — I10 Essential (primary) hypertension: Secondary | ICD-10-CM

## 2016-05-04 DIAGNOSIS — Z8673 Personal history of transient ischemic attack (TIA), and cerebral infarction without residual deficits: Secondary | ICD-10-CM | POA: Diagnosis not present

## 2016-05-04 DIAGNOSIS — M792 Neuralgia and neuritis, unspecified: Secondary | ICD-10-CM | POA: Diagnosis not present

## 2016-05-04 DIAGNOSIS — E1121 Type 2 diabetes mellitus with diabetic nephropathy: Secondary | ICD-10-CM | POA: Diagnosis not present

## 2016-05-04 DIAGNOSIS — Z5181 Encounter for therapeutic drug level monitoring: Secondary | ICD-10-CM | POA: Diagnosis not present

## 2016-05-04 LAB — LIPID PANEL
CHOLESTEROL TOTAL: 134 mg/dL (ref 100–199)
Chol/HDL Ratio: 3.6 ratio units (ref 0.0–5.0)
HDL: 37 mg/dL — ABNORMAL LOW (ref >39)
LDL CALC: 75 mg/dL (ref 0–99)
Triglycerides: 111 mg/dL (ref 0–149)
VLDL Cholesterol Cal: 22 mg/dL (ref 5–40)

## 2016-05-04 LAB — THYROID PANEL WITH TSH
FREE THYROXINE INDEX: 2.3 (ref 1.2–4.9)
T3 UPTAKE RATIO: 30 % (ref 24–39)
T4 TOTAL: 7.8 ug/dL (ref 4.5–12.0)
TSH: 1.12 u[IU]/mL (ref 0.450–4.500)

## 2016-05-04 LAB — CMP14+EGFR
A/G RATIO: 1.5 (ref 1.2–2.2)
ALBUMIN: 3.7 g/dL (ref 3.5–5.5)
ALK PHOS: 55 IU/L (ref 39–117)
ALT: 11 IU/L (ref 0–44)
AST: 17 IU/L (ref 0–40)
BUN / CREAT RATIO: 10 (ref 9–20)
BUN: 12 mg/dL (ref 6–24)
CO2: 22 mmol/L (ref 18–29)
CREATININE: 1.26 mg/dL (ref 0.76–1.27)
Calcium: 8.7 mg/dL (ref 8.7–10.2)
Chloride: 96 mmol/L (ref 96–106)
GFR calc Af Amer: 77 mL/min/{1.73_m2} (ref >59)
GFR calc non Af Amer: 67 mL/min/{1.73_m2} (ref >59)
GLOBULIN, TOTAL: 2.4 g/dL (ref 1.5–4.5)
Glucose: 182 mg/dL — ABNORMAL HIGH (ref 65–99)
POTASSIUM: 4.3 mmol/L (ref 3.5–5.2)
SODIUM: 135 mmol/L (ref 134–144)
Total Protein: 6.1 g/dL (ref 6.0–8.5)

## 2016-05-04 NOTE — Telephone Encounter (Signed)
Pt called in c/o seeing double and dizziness Instructed pt to go to ER immediately H/o subarachnoid hemorrhage

## 2016-05-05 ENCOUNTER — Ambulatory Visit (HOSPITAL_COMMUNITY): Payer: Medicare Other | Admitting: Physical Therapy

## 2016-05-05 ENCOUNTER — Other Ambulatory Visit: Payer: Self-pay

## 2016-05-05 ENCOUNTER — Telehealth (HOSPITAL_COMMUNITY): Payer: Self-pay | Admitting: Physical Therapy

## 2016-05-05 DIAGNOSIS — S300XXS Contusion of lower back and pelvis, sequela: Secondary | ICD-10-CM

## 2016-05-05 DIAGNOSIS — K219 Gastro-esophageal reflux disease without esophagitis: Secondary | ICD-10-CM

## 2016-05-05 DIAGNOSIS — E785 Hyperlipidemia, unspecified: Secondary | ICD-10-CM

## 2016-05-05 DIAGNOSIS — I10 Essential (primary) hypertension: Secondary | ICD-10-CM

## 2016-05-05 DIAGNOSIS — E1142 Type 2 diabetes mellitus with diabetic polyneuropathy: Secondary | ICD-10-CM

## 2016-05-05 DIAGNOSIS — Z794 Long term (current) use of insulin: Secondary | ICD-10-CM

## 2016-05-05 DIAGNOSIS — E119 Type 2 diabetes mellitus without complications: Secondary | ICD-10-CM

## 2016-05-05 MED ORDER — FLUTICASONE FUROATE-VILANTEROL 100-25 MCG/INH IN AEPB: 1.0000 | INHALATION_SPRAY | Freq: Every day | RESPIRATORY_TRACT | 1 refills | Status: DC

## 2016-05-05 MED ORDER — BLOOD GLUCOSE MONITOR KIT: PACK | 0 refills | Status: DC

## 2016-05-05 MED ORDER — PREGABALIN 150 MG PO CAPS: ORAL_CAPSULE | ORAL | 0 refills | Status: DC

## 2016-05-05 MED ORDER — CANAGLIFLOZIN 300 MG PO TABS: ORAL_TABLET | ORAL | 0 refills | Status: DC

## 2016-05-05 MED ORDER — BLOOD GLUCOSE TEST VI STRP: ORAL_STRIP | 2 refills | Status: DC

## 2016-05-05 MED ORDER — NAPROXEN 500 MG PO TABS
ORAL_TABLET | ORAL | 0 refills | Status: DC
Start: 2016-05-05 — End: 2016-07-29

## 2016-05-05 MED ORDER — LANCETS MISC: 1 refills | Status: DC

## 2016-05-05 MED ORDER — ALBUTEROL SULFATE HFA 108 (90 BASE) MCG/ACT IN AERS: INHALATION_SPRAY | RESPIRATORY_TRACT | 0 refills | Status: DC

## 2016-05-05 MED ORDER — AMLODIPINE BESYLATE 5 MG PO TABS: ORAL_TABLET | ORAL | 0 refills | Status: DC

## 2016-05-05 MED ORDER — ASPIRIN EC 81 MG PO TBEC: 81.0000 mg | DELAYED_RELEASE_TABLET | Freq: Every day | ORAL | 1 refills | Status: DC

## 2016-05-05 MED ORDER — ATORVASTATIN CALCIUM 80 MG PO TABS: 80.0000 mg | ORAL_TABLET | Freq: Every day | ORAL | 0 refills | Status: DC

## 2016-05-05 MED ORDER — JANUVIA 100 MG PO TABS: 100.0000 mg | ORAL_TABLET | Freq: Every day | ORAL | 0 refills | Status: DC

## 2016-05-05 MED ORDER — OMEPRAZOLE 20 MG PO CPDR: DELAYED_RELEASE_CAPSULE | ORAL | 0 refills | Status: DC

## 2016-05-05 MED ORDER — LEVOTHYROXINE SODIUM 200 MCG PO TABS: ORAL_TABLET | ORAL | 2 refills | Status: DC

## 2016-05-05 MED ORDER — DULAGLUTIDE 1.5 MG/0.5ML ~~LOC~~ SOAJ: SUBCUTANEOUS | 1 refills | Status: DC

## 2016-05-05 NOTE — Telephone Encounter (Signed)
Patient was seen on Monday by provider.
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
## 2016-05-05 NOTE — Telephone Encounter (Signed)
Patient has changed pharmacies. Please advise on refill and route to pool A so [facility]

## 2016-05-06 ENCOUNTER — Telehealth: Payer: Self-pay | Admitting: Family

## 2016-05-06 NOTE — Telephone Encounter (Signed)
Wille CelesteJanie got the call

## 2016-05-06 NOTE — Telephone Encounter (Signed)
Refill called to [facility] 

## 2016-05-06 NOTE — Telephone Encounter (Signed)
[facility]. Glucose monitor did go across electronically

## 2016-05-07 ENCOUNTER — Ambulatory Visit (HOSPITAL_COMMUNITY): Payer: Medicare Other

## 2016-05-07 DIAGNOSIS — M25552 Pain in left hip: Secondary | ICD-10-CM

## 2016-05-07 DIAGNOSIS — S31821D Laceration without foreign body of left buttock, subsequent encounter: Secondary | ICD-10-CM | POA: Diagnosis not present

## 2016-05-07 NOTE — Therapy (Signed)
[facility] [address] [place], [place], [address] Phone: [phone]   Fax:  [phone]  Wound Care Therapy  Patient Details  Name: Charles EwingsMichael T Esteve MRN: [id number] Date of Birth: 12-Jan-1966 Referring Provider: Jannifer Rodneyhristy [name]   Encounter Date: 05/07/2016      PT End of Session - 05/07/16 1840    Visit Number 2   Number of Visits 13   Date for PT Re-Evaluation 05/26/16   Authorization Type Medicare/Medicaid    Authorization Time Period 04/28/16 to 05/29/16   Authorization - Visit Number 2   Authorization - Number of Visits 10   PT Start Time 1033   PT Stop Time 1107   PT Time Calculation (min) 34 min   Activity Tolerance Patient tolerated treatment well   Behavior [facility] for tasks assessed/performed      Past Medical History:  Diagnosis Date  . Anxiety   . Asthma   . Diabetes mellitus   . Diabetes mellitus without complication (HCC)   . Hypercholesteremia   . Hypertension   . Hypothyroidism   . Low back pain   . Obesity   . SOB (shortness of breath)   . Stroke [facility](HCC) age 51  . Stroke (HCC)   . Vertigo     Past Surgical History:  Procedure Laterality Date  . BACK SURGERY  2013  . COLONOSCOPY  2009   Inflammatory changes of the cecum and ascending colon most consistent with infectious etiology, NSAID, ischemia. Suspected resolving infection based on symptomatology.  Marland Kitchen. FOOT SURGERY     Dr Ulice Brilliantrake  . SHOULDER ARTHROSCOPY Right   . SPINE SURGERY    . WRIST SURGERY      There were no vitals filed for this visit.       Subjective Assessment - 05/07/16 1106    Subjective Pt stated wound is a little sore, no real reports of pain today.  Reports compliance wtih dressing change daily.     Currently in Pain? No/denies                   Wound Therapy - 05/07/16 1107    Subjective Pt stated wound is a little sore, no real reports of pain today.  Reports compliance wtih dressing change daily.      Patient and Family Stated Goals wound to heal    Prior Treatments stitches, general observation from doctors/referral to wound clinic    Pain Assessment No/denies pain   Pain Score 0-No pain   Evaluation and Treatment Procedures Explained to Patient/Family Yes   Evaluation and Treatment Procedures agreed to   Wound Properties Date First Assessed: 04/28/16 Wound Type: Laceration Location: Buttocks Location Orientation: Left Wound Description (Comments): L gluteal laceration  Present on Admission: Yes   Dressing Type Silver hydrofiber  silver hydrofiber, ABD pad and medipore tape   Dressing Changed Changed   Dressing Status Clean;Dry;Intact   Dressing Change Frequency PRN   % Wound base Red or Granulating 95%   % Wound base Yellow/Fibrinous Exudate 5%   Peri-wound Assessment Erythema (non-blanchable);Pink;Other (Comment)   Margins Unattached edges (unapproximated)   Closure None   Drainage Amount Moderate   Drainage Description Serosanguineous   Treatment Cleansed;Debridement (Selective);Hydrotherapy (Pulse lavage)   Pulsed lavage therapy - wound location L gluteal wound    Pulsed Lavage with Suction (psi) 4 psi   Pulsed Lavage with Suction - Normal Saline Used 1000 mL   Pulsed Lavage Tip Tip with splash  shield   Selective Debridement - Location L gluteal wound    Selective Debridement - Tools Used Scalpel   Selective Debridement - Tissue Removed necrotic tissue    Wound Therapy - Clinical Statement Copy of eval given to pt.  Pt arrived with reports of changing dressings daily with no questions concerning care at home.  Continued with PLS for cleansing with selective debridmenet for removal of necrotic tissue.  Able to remove significant amount with no reports of pain through session.  Continued with silverhydrofiber with ABD pads for drainage and tape.     Wound Therapy - Functional Problem List non-healing laceration    Factors Delaying/Impairing Wound Healing Diabetes  Mellitus;Multiple medical problems;Polypharmacy;Tobacco use   Hydrotherapy Plan Pulsatile lavage with suction   Wound Therapy - Frequency 3X / week   Wound Therapy - Current Recommendations PT   Wound Plan pulsed lavage, packing with silver, debridement, ABD pads    Dressing  Silver hydrofiber, ABD pad and tape                   PT Short Term Goals - 04/28/16 1409      PT SHORT TERM GOAL #1   Title Wound area to be 100% granulated and with no slough/biofilm in order to show general improvement of condition    Time 2   Period Weeks   Status New     PT SHORT TERM GOAL #2   Title Wound borders to display healthy, healing tissue in order to demonstrate general improvement of condition    Time 2   Period Weeks   Status New     PT SHORT TERM GOAL #3   Title Wound depth to have decreased to 2cm along entire depth of wound in order to show general improvement of condition    Time 2   Period Weeks   Status New           PT Long Term Goals - 04/28/16 1410      PT LONG TERM GOAL #1   Title Wound to be less than 0.2cm deep along entire length of wound in order to show general improvement of condition    Time 4   Period Weeks   Status New     PT LONG TERM GOAL #2   Title Patient to be independent in applying appropriate wound dressing and skin care in order to facilitate full wound healing moving forwards on an independent basis    Time 4   Period Weeks   Status New             Patient will benefit from skilled therapeutic intervention in order to improve the following deficits and impairments:     Visit Diagnosis: Laceration of buttock with complication, left, subsequent encounter  Pain in left hip     Problem List Patient Active Problem List   Diagnosis Date Noted  . Multiple lacerations 04/04/2016  . Cocaine abuse 12/12/2015  . Pain medication agreement broken 12/12/2015  . Insomnia 10/28/2015  . Pain medication agreement signed 07/23/2015   . Opioid dependence (HCC) 07/23/2015  . Morbid obesity (HCC) 06/26/2015  . Chronic back pain 06/23/2015  . Rupture of ulnar collateral ligament of thumb 03/01/2014  . Memory difficulties 08/15/2013  . Diabetic neuropathy associated with type 2 diabetes mellitus (HCC) 08/15/2013  . GERD (gastroesophageal reflux disease) 07/19/2013  . GAD (generalized anxiety disorder) 07/19/2013  . Depression 07/19/2013  . 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  Postlaminectomy syndrome, lumbar region 07/05/2011  . Hyperlipidemia 12/30/2009  . SMOKER 12/30/2009  . DEEP VEIN THROMBOSIS/PHLEBITIS 12/30/2009  . Colitis, acute 05/27/2008  . Hypothyroidism 05/23/2008  . Type 2 diabetes mellitus treated with insulin (HCC) 05/23/2008  . Essential hypertension 05/23/2008  . History of CVA (cerebrovascular accident) 05/23/2008  . Asthma 05/23/2008  . FATTY LIVER DISEASE 05/23/2008  . ABDOMINAL PAIN 05/23/2008   Becky Sax, LPTA; CBIS [phone]  Juel Burrow 05/07/2016, 6:41 PM  [facility] [address] [place], [place], [address] Phone: [phone]   Fax:  [phone]  Name: FIRMIN BELISLE MRN: [id number] Date of Birth: 11-27-1965

## 2016-05-10 ENCOUNTER — Ambulatory Visit (HOSPITAL_COMMUNITY): Payer: Medicare Other | Admitting: Physical Therapy

## 2016-05-10 DIAGNOSIS — M25552 Pain in left hip: Secondary | ICD-10-CM

## 2016-05-10 DIAGNOSIS — S31821D Laceration without foreign body of left buttock, subsequent encounter: Secondary | ICD-10-CM

## 2016-05-10 NOTE — Therapy (Signed)
[facility] [address] [place], [place], [address] Phone: [phone]   Fax:  [phone]  Wound Care Therapy  Patient Details  Name: Charles Pennington MRN: [id number] Date of Birth: [birth year] Referring Provider: [name]   Encounter Date: 05/10/2016      PT End of Session - 05/10/16 1514    Visit Number 3   Number of Visits 13   Date for PT Re-Evaluation 05/26/16   Authorization Type Medicare/Medicaid    Authorization Time Period 04/28/16 to 05/29/16   Authorization - Visit Number 3   Authorization - Number of Visits 10   PT Start Time 1430   PT Stop Time 1510   PT Time Calculation (min) 40 min   Activity Tolerance Patient tolerated treatment well   Behavior During Therapy [facility] for tasks assessed/performed      Past Medical History:  Diagnosis Date  . Anxiety   . Asthma   . Diabetes mellitus   . Diabetes mellitus without complication (HCC)   . Hypercholesteremia   . Hypertension   . Hypothyroidism   . Low back pain   . Obesity   . SOB (shortness of breath)   . Stroke [facility]) age 25  . Stroke (HCC)   . Vertigo     Past Surgical History:  Procedure Laterality Date  . BACK SURGERY  2013  . COLONOSCOPY  2009   Inflammatory changes of the cecum and ascending colon most consistent with infectious etiology, NSAID, ischemia. Suspected resolving infection based on symptomatology.  Marland Kitchen FOOT SURGERY     Dr Ulice Brilliant  . SHOULDER ARTHROSCOPY Right   . SPINE SURGERY    . WRIST SURGERY      There were no vitals filed for this visit.                  Wound Therapy - 05/10/16 1509    Subjective Pt states he's been changing it 3X day.  No pain or problems.  Not taking antibiotics.   Patient and Family Stated Goals wound to heal    Prior Treatments stitches, general observation from doctors/referral to wound clinic    Pain Assessment No/denies pain   Evaluation and Treatment Procedures Explained to Patient/Family  Yes   Evaluation and Treatment Procedures agreed to   Wound Properties Date First Assessed: 04/28/16 Wound Type: Laceration Location: Buttocks Location Orientation: Left Wound Description (Comments): L gluteal laceration  Present on Admission: Yes   Dressing Type Silver hydrofiber  silver hydrofiber, ABD pad and medipore tape   Dressing Changed Changed   Dressing Status Clean;Dry;Intact   Dressing Change Frequency PRN   Site / Wound Assessment Granulation tissue;Yellow   % Wound base Red or Granulating 95%   % Wound base Yellow/Fibrinous Exudate 5%   Peri-wound Assessment Intact   Wound Length (cm) 10.8 cm   Wound Width (cm) 2.5 cm  varied 1-2.5, however depth inferior end   Wound Depth (cm) 4 cm  varies 3-4cm at inferior end   Margins Unattached edges (unapproximated)   Closure None   Drainage Amount Minimal   Drainage Description Serosanguineous   Treatment Hydrotherapy (Pulse lavage);Debridement (Selective)   Pulsed lavage therapy - wound location L gluteal wound    Pulsed Lavage with Suction (psi) 4 psi   Pulsed Lavage with Suction - Normal Saline Used 1000 mL   Pulsed Lavage Tip Tip with splash shield   Selective Debridement - Location wound borders   Selective Debridement - Tools  Used Forceps;Scissors   Selective Debridement - Tissue Removed necrotic tissue    Wound Therapy - Clinical Statement able to debride more of necrotic wound borders.  Base of wound and edges 100% granualted.  Overall decreased size and depth of wound as compared to initial measurements.  Instructed to keep dressing in place, not to change it or take showers at this point until wound is more superficial to avoid infection.   Pt verbalized understanding.     Wound Therapy - Functional Problem List non-healing laceration    Factors Delaying/Impairing Wound Healing Diabetes Mellitus;Multiple medical problems;Polypharmacy;Tobacco use   Hydrotherapy Plan Pulsatile lavage with suction   Wound Therapy -  Frequency 3X / week   Wound Therapy - Current Recommendations PT   Wound Plan pulsed lavage, packing with silver, debridement, ABD pads.  Measure weakly.   Dressing  Silver hydrofiber, ABD pad and tape                   PT Short Term Goals - 04/28/16 1409      PT SHORT TERM GOAL #1   Title Wound area to be 100% granulated and with no slough/biofilm in order to show general improvement of condition    Time 2   Period Weeks   Status New     PT SHORT TERM GOAL #2   Title Wound borders to display healthy, healing tissue in order to demonstrate general improvement of condition    Time 2   Period Weeks   Status New     PT SHORT TERM GOAL #3   Title Wound depth to have decreased to 2cm along entire depth of wound in order to show general improvement of condition    Time 2   Period Weeks   Status New           PT Long Term Goals - 04/28/16 1410      PT LONG TERM GOAL #1   Title Wound to be less than 0.2cm deep along entire length of wound in order to show general improvement of condition    Time 4   Period Weeks   Status New     PT LONG TERM GOAL #2   Title Patient to be independent in applying appropriate wound dressing and skin care in order to facilitate full wound healing moving forwards on an independent basis    Time 4   Period Weeks   Status New             Patient will benefit from skilled therapeutic intervention in order to improve the following deficits and impairments:     Visit Diagnosis: Laceration of buttock with complication, left, subsequent encounter  Pain in left hip     Problem List Patient Active Problem List   Diagnosis Date Noted  . Multiple lacerations 04/04/2016  . Cocaine abuse 12/12/2015  . Pain medication agreement broken 12/12/2015  . Insomnia 10/28/2015  . Pain medication agreement signed 07/23/2015  . Opioid dependence (HCC) 07/23/2015  . Morbid obesity (HCC) 06/26/2015  . Chronic back pain 06/23/2015  .  Rupture of ulnar collateral ligament of thumb 03/01/2014  . Memory difficulties 08/15/2013  . Diabetic neuropathy associated with type 2 diabetes mellitus (HCC) 08/15/2013  . GERD (gastroesophageal reflux disease) 07/19/2013  . GAD (generalized anxiety disorder) 07/19/2013  . Depression 07/19/2013  . 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 Postlaminectomy syndrome, lumbar region 07/05/2011  . Hyperlipidemia 12/30/2009  . SMOKER 12/30/2009  .  DEEP VEIN THROMBOSIS/PHLEBITIS 12/30/2009  . Colitis, acute 05/27/2008  . Hypothyroidism 05/23/2008  . Type 2 diabetes mellitus treated with insulin (HCC) 05/23/2008  . Essential hypertension 05/23/2008  . History of CVA (cerebrovascular accident) 05/23/2008  . Asthma 05/23/2008  . FATTY LIVER DISEASE 05/23/2008  . ABDOMINAL PAIN 05/23/2008    Lurena Nida, PTA/CLT [phone]  05/10/2016, 3:15 PM  [facility] [address] [place], [place], [address] Phone: [phone]   Fax:  [phone]  Name: GAL FELDHAUS MRN: [id number] Date of Birth: 10-12-1965

## 2016-05-12 ENCOUNTER — Ambulatory Visit (HOSPITAL_COMMUNITY): Payer: Medicare Other | Admitting: Physical Therapy

## 2016-05-12 ENCOUNTER — Ambulatory Visit (HOSPITAL_COMMUNITY): Payer: Medicare Other

## 2016-05-12 DIAGNOSIS — S31821D Laceration without foreign body of left buttock, subsequent encounter: Secondary | ICD-10-CM | POA: Diagnosis not present

## 2016-05-12 DIAGNOSIS — M25552 Pain in left hip: Secondary | ICD-10-CM

## 2016-05-12 NOTE — Therapy (Signed)
[facility] [address] [place], [place], [address] Phone: [phone]   Fax:  [phone]  Wound Care Therapy  Patient Details  Name: Charles Pennington MRN: [id number] Date of Birth: 03/17/1965 Referring Provider: Jannifer Rodneyhristy [name]   Encounter Date: 05/12/2016      PT End of Session - 05/12/16 1413    Visit Number 4   Number of Visits 13   Date for PT Re-Evaluation 05/26/16   Authorization Type Medicare/Medicaid    Authorization Time Period 04/28/16 to 05/29/16   Authorization - Visit Number 4   Authorization - Number of Visits 10   PT Start Time 1325   PT Stop Time 1406   PT Time Calculation (min) 41 min   Activity Tolerance Patient tolerated treatment well;No increased pain   Behavior During Therapy [facility] for tasks assessed/performed      Past Medical History:  Diagnosis Date  . Anxiety   . Asthma   . Diabetes mellitus   . Diabetes mellitus without complication (HCC)   . Hypercholesteremia   . Hypertension   . Hypothyroidism   . Low back pain   . Obesity   . SOB (shortness of breath)   . Stroke [facility](HCC) age 51  . Stroke (HCC)   . Vertigo     Past Surgical History:  Procedure Laterality Date  . BACK SURGERY  2013  . COLONOSCOPY  2009   Inflammatory changes of the cecum and ascending colon most consistent with infectious etiology, NSAID, ischemia. Suspected resolving infection based on symptomatology.  Marland Kitchen. FOOT SURGERY     Dr Ulice Brilliantrake  . SHOULDER ARTHROSCOPY Right   . SPINE SURGERY    . WRIST SURGERY      There were no vitals filed for this visit.       Subjective Assessment - 05/12/16 1404    Subjective Pt entered dept with dressings intact, current pain scale 6/10 Lt buttocks   Currently in Pain? Yes   Pain Score 6    Pain Location Buttocks   Pain Orientation Left   Pain Type Acute pain           Wound Therapy - 05/12/16 1404    Subjective Pt entered dept with dressings intact, current pain scale 6/10 Rt  buttocks   Patient and Family Stated Goals wound to heal    Prior Treatments stitches, general observation from doctors/referral to wound clinic    Pain Assessment 0-10   Evaluation and Treatment Procedures Explained to Patient/Family Yes   Evaluation and Treatment Procedures agreed to   Wound Properties Date First Assessed: 04/28/16 Wound Type: Laceration Location: Buttocks Location Orientation: Left Wound Description (Comments): L gluteal laceration  Present on Admission: Yes   Dressing Type Silver hydrofiber  Silver hydrofiber, 2 ABD pads and medipore tape   Dressing Changed Changed   Dressing Status Clean;Dry;Intact   Dressing Change Frequency PRN   Site / Wound Assessment Granulation tissue;Yellow   % Wound base Red or Granulating 95%   % Wound base Yellow/Fibrinous Exudate 5%   Wound Length (cm) 10.5 cm   Wound Width (cm) --  varied from 1-2.5, increased depth inferior end   Wound Depth (cm) --  Range from 3.6-4.8 cm, wound at diagonal angle   Margins Unattached edges (unapproximated)   Closure None   Drainage Amount Moderate   Drainage Description Serosanguineous   Treatment Hydrotherapy (Pulse lavage);Cleansed;Debridement (Selective)   Pulsed lavage therapy - wound location L gluteal wound  Pulsed Lavage with Suction (psi) 4 psi   Pulsed Lavage with Suction - Normal Saline Used 1000 mL   Pulsed Lavage Tip Tip with splash shield   Selective Debridement - Location wound borders   Selective Debridement - Tools Used Forceps;Scissors   Selective Debridement - Tissue Removed necrotic tissue    Wound Therapy - Clinical Statement Noted pt. smoking cigarette while waiting for apt.  Pt educated on decreased tissue healing with cigarette smoking, pt verbally understood and stated he would work towards reduction.  (Smoke average 1 pack for a day and half.  Pt left dressing intact with noted drainage through pad.  Continued with PLS for cleanins with selective debridement for removal of  slough.  Continued with silverhydrofiber to address drainage and promote healing with 2 ABD pads applied for drainage and medipore tape.  Overall skin intergity looks good, no affected by wound today.  Reports pain reduced at EOS.     Wound Therapy - Functional Problem List non-healing laceration    Factors Delaying/Impairing Wound Healing Diabetes Mellitus;Multiple medical problems;Polypharmacy;Tobacco use   Hydrotherapy Plan Pulsatile lavage with suction   Wound Therapy - Frequency 3X / week   Wound Therapy - Current Recommendations PT   Wound Plan pulsed lavage, packing with silver, debridement, ABD pads.  Measure weakly.   Dressing  Silver hydrofiber, ABD pad and tape                   PT Short Term Goals - 04/28/16 1409      PT SHORT TERM GOAL #1   Title Wound area to be 100% granulated and with no slough/biofilm in order to show general improvement of condition    Time 2   Period Weeks   Status New     PT SHORT TERM GOAL #2   Title Wound borders to display healthy, healing tissue in order to demonstrate general improvement of condition    Time 2   Period Weeks   Status New     PT SHORT TERM GOAL #3   Title Wound depth to have decreased to 2cm along entire depth of wound in order to show general improvement of condition    Time 2   Period Weeks   Status New           PT Long Term Goals - 04/28/16 1410      PT LONG TERM GOAL #1   Title Wound to be less than 0.2cm deep along entire length of wound in order to show general improvement of condition    Time 4   Period Weeks   Status New     PT LONG TERM GOAL #2   Title Patient to be independent in applying appropriate wound dressing and skin care in order to facilitate full wound healing moving forwards on an independent basis    Time 4   Period Weeks   Status New             Patient will benefit from skilled therapeutic intervention in order to improve the following deficits and impairments:      Visit Diagnosis: Laceration of buttock with complication, left, subsequent encounter  Pain in left hip     Problem List Patient Active Problem List   Diagnosis Date Noted  . Multiple lacerations 04/04/2016  . Cocaine abuse 12/12/2015  . Pain medication agreement broken 12/12/2015  . Insomnia 10/28/2015  . Pain medication agreement signed 07/23/2015  . Opioid dependence (HCC) 07/23/2015  .  Morbid obesity (HCC) 06/26/2015  . Chronic back pain 06/23/2015  . Rupture of ulnar collateral ligament of thumb 03/01/2014  . Memory difficulties 08/15/2013  . Diabetic neuropathy associated with type 2 diabetes mellitus (HCC) 08/15/2013  . GERD (gastroesophageal reflux disease) 07/19/2013  . GAD (generalized anxiety disorder) 07/19/2013  . Depression 07/19/2013  . 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 Postlaminectomy syndrome, lumbar region 07/05/2011  . Hyperlipidemia 12/30/2009  . SMOKER 12/30/2009  . DEEP VEIN THROMBOSIS/PHLEBITIS 12/30/2009  . Colitis, acute 05/27/2008  . Hypothyroidism 05/23/2008  . Type 2 diabetes mellitus treated with insulin (HCC) 05/23/2008  . Essential hypertension 05/23/2008  . History of CVA (cerebrovascular accident) 05/23/2008  . Asthma 05/23/2008  . FATTY LIVER DISEASE 05/23/2008  . ABDOMINAL PAIN 05/23/2008   Becky Sax, LPTA; CBIS [phone]  Juel Burrow 05/12/2016, 2:14 PM  [facility] [address] [place], [place], [address] Phone: [phone]   Fax:  [phone]  Name: Charles Pennington MRN: [id number] Date of Birth: 20-Jul-1965

## 2016-05-14 ENCOUNTER — Ambulatory Visit (HOSPITAL_COMMUNITY): Payer: Self-pay

## 2016-05-16 ENCOUNTER — Emergency Department (HOSPITAL_COMMUNITY)
Admission: EM | Admit: 2016-05-16 | Discharge: 2016-05-16 | Disposition: A | Discharge status: Home / Self Care | Payer: Medicare Other | Attending: Emergency Medicine | Admitting: Emergency Medicine

## 2016-05-16 ENCOUNTER — Encounter (HOSPITAL_COMMUNITY): Payer: Self-pay | Admitting: Cardiology

## 2016-05-16 DIAGNOSIS — E039 Hypothyroidism, unspecified: Secondary | ICD-10-CM | POA: Insufficient documentation

## 2016-05-16 DIAGNOSIS — Z7982 Long term (current) use of aspirin: Secondary | ICD-10-CM | POA: Insufficient documentation

## 2016-05-16 DIAGNOSIS — L03317 Cellulitis of buttock: Secondary | ICD-10-CM | POA: Insufficient documentation

## 2016-05-16 DIAGNOSIS — J45909 Unspecified asthma, uncomplicated: Secondary | ICD-10-CM | POA: Insufficient documentation

## 2016-05-16 DIAGNOSIS — F17210 Nicotine dependence, cigarettes, uncomplicated: Secondary | ICD-10-CM | POA: Diagnosis not present

## 2016-05-16 DIAGNOSIS — E119 Type 2 diabetes mellitus without complications: Secondary | ICD-10-CM | POA: Insufficient documentation

## 2016-05-16 DIAGNOSIS — Z794 Long term (current) use of insulin: Secondary | ICD-10-CM | POA: Diagnosis not present

## 2016-05-16 DIAGNOSIS — L089 Local infection of the skin and subcutaneous tissue, unspecified: Secondary | ICD-10-CM | POA: Diagnosis present

## 2016-05-16 MED ORDER — TRAMADOL HCL 50 MG PO TABS: 50.0000 mg | ORAL_TABLET | Freq: Four times a day (QID) | ORAL | 0 refills | Status: DC | PRN

## 2016-05-16 MED ORDER — DOXYCYCLINE HYCLATE 100 MG PO CAPS: 100.0000 mg | ORAL_CAPSULE | Freq: Two times a day (BID) | ORAL | 0 refills | Status: DC

## 2016-05-16 MED ORDER — DOXYCYCLINE HYCLATE 100 MG PO TABS
100.0000 mg | ORAL_TABLET | Freq: Once | ORAL | Status: AC
  Administered 2016-05-16: 100 mg via ORAL
  Filled 2016-05-16: qty 1

## 2016-05-16 MED ORDER — TRAMADOL HCL 50 MG PO TABS
50.0000 mg | ORAL_TABLET | Freq: Once | ORAL | Status: AC
  Administered 2016-05-16: 50 mg via ORAL
  Filled 2016-05-16: qty 1
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
## 2016-05-16 NOTE — Discharge Instructions (Signed)
Prescription for antibiotic and pain medicine. Can take shower. Rinse water over wound and then dab dry.  Follow-up with your doctors on Monday.

## 2016-05-17 ENCOUNTER — Ambulatory Visit (HOSPITAL_COMMUNITY): Payer: Medicare Other | Attending: Family | Admitting: Physical Therapy

## 2016-05-17 DIAGNOSIS — M25552 Pain in left hip: Secondary | ICD-10-CM

## 2016-05-17 DIAGNOSIS — S31821D Laceration without foreign body of left buttock, subsequent encounter: Secondary | ICD-10-CM

## 2016-05-17 NOTE — Therapy (Signed)
[facility] [address] [place], [place], [address] Phone: [phone]   Fax:  [phone]  Wound Care Therapy  Patient Details  Name: Charles Pennington MRN: [id number] Date of Birth: 13-Nov-1965 Referring Provider: [name]   Encounter Date: 05/17/2016      PT End of Session - 05/17/16 1337    Visit Number 5   Number of Visits 13   Date for PT Re-Evaluation 05/26/16   Authorization Type Medicare/Medicaid    Authorization Time Period 5   Authorization - Visit Number 5   Authorization - Number of Visits 10   PT Start Time 1301   PT Stop Time 1332   PT Time Calculation (min) 31 min   Activity Tolerance Patient tolerated treatment well   Behavior During Therapy [facility] for tasks assessed/performed      Past Medical History:  Diagnosis Date  . Anxiety   . Asthma   . Diabetes mellitus   . Diabetes mellitus without complication (HCC)   . Hypercholesteremia   . Hypertension   . Hypothyroidism   . Low back pain   . Obesity   . SOB (shortness of breath)   . Stroke [facility]) age 32  . Stroke (HCC)   . Vertigo     Past Surgical History:  Procedure Laterality Date  . BACK SURGERY  2013  . COLONOSCOPY  2009   Inflammatory changes of the cecum and ascending colon most consistent with infectious etiology, NSAID, ischemia. Suspected resolving infection based on symptomatology.  [facility] FOOT SURGERY     Dr Ulice Brilliant  . SHOULDER ARTHROSCOPY Right   . SPINE SURGERY    . WRIST SURGERY      There were no vitals filed for this visit.                  Wound Therapy - 05/17/16 1334    Subjective Patient arrives today stating he went to ED this weekend for wound due to bleeding and increased odor, they told him to keep coming to wound care    Patient and Family Stated Goals wound to heal    Date of Onset --  February 2018   Prior Treatments stitches, general observation from doctors/referral to wound clinic    Pain Score 0-No pain    Evaluation and Treatment Procedures Explained to Patient/Family Yes   Evaluation and Treatment Procedures agreed to   Wound Properties Date First Assessed: 04/28/16 Wound Type: Laceration Location: Buttocks Location Orientation: Left Wound Description (Comments): L gluteal laceration  Present on Admission: Yes   Dressing Type Gauze (Comment);Paper tape   Dressing Changed Changed   Dressing Status Old drainage   Dressing Change Frequency PRN   % Wound base Red or Granulating 90%   % Wound base Yellow/Fibrinous Exudate 10%   Wound Length (cm) 10 cm  with lateral wound border beginning to approximate    Wound Width (cm) 2 cm  at widest, approx 1cm otherwise    Wound Depth (cm) 4 cm  at deepests, appears to range from 2-4cm    Margins Unattached edges (unapproximated)   Drainage Amount Moderate   Drainage Description Serosanguineous   Treatment Cleansed;Hydrotherapy (Pulse lavage);Packing (Impregnated strip);Irrigation;Tape changed   Pulsed lavage therapy - wound location L gluteal wound    Pulsed Lavage with Suction (psi) 4 psi   Pulsed Lavage with Suction - Normal Saline Used 1000 mL   Pulsed Lavage Tip Tip with splash shield   Wound Therapy -  Clinical Statement  Continued with pulsed lavage treatment and silver packing today, noting some mild improvement in objective wound measures today however continue to note slough/unviable tissue around external borders of wound today. Educated patient regarding avoiding soaking or spraying his wound with Epsom salt solution and encouraged to change dressing when bandage becomes saturated with drainage.     Wound Therapy - Functional Problem List non-healing laceration    Factors Delaying/Impairing Wound Healing Diabetes Mellitus;Multiple medical problems;Polypharmacy;Tobacco use   Hydrotherapy Plan Pulsatile lavage with suction   Wound Therapy - Frequency 3X / week   Wound Therapy - Current Recommendations PT   Wound Plan pulsed lavage, packing  with silver, debridement, ABD pads.  Measure weakly.                 PT Education - 05/17/16 1337    Education provided Yes   Education Details do not soak area in epsom salt bath or use salt spray on wound, change bandage when it becomes saturated    Person(s) Educated Patient   Methods Explanation   Comprehension Verbalized understanding          PT Short Term Goals - 04/28/16 1409      PT SHORT TERM GOAL #1   Title Wound area to be 100% granulated and with no slough/biofilm in order to show general improvement of condition    Time 2   Period Weeks   Status New     PT SHORT TERM GOAL #2   Title Wound borders to display healthy, healing tissue in order to demonstrate general improvement of condition    Time 2   Period Weeks   Status New     PT SHORT TERM GOAL #3   Title Wound depth to have decreased to 2cm along entire depth of wound in order to show general improvement of condition    Time 2   Period Weeks   Status New           PT Long Term Goals - 04/28/16 1410      PT LONG TERM GOAL #1   Title Wound to be less than 0.2cm deep along entire length of wound in order to show general improvement of condition    Time 4   Period Weeks   Status New     PT LONG TERM GOAL #2   Title Patient to be independent in applying appropriate wound dressing and skin care in order to facilitate full wound healing moving forwards on an independent basis    Time 4   Period Weeks   Status New             Patient will benefit from skilled therapeutic intervention in order to improve the following deficits and impairments:     Visit Diagnosis: Laceration of buttock with complication, left, subsequent encounter  Pain in left hip     Problem List Patient Active Problem List   Diagnosis Date Noted  . Multiple lacerations 04/04/2016  . Cocaine abuse 12/12/2015  . Pain medication agreement broken 12/12/2015  . Insomnia 10/28/2015  . Pain medication  agreement signed 07/23/2015  . Opioid dependence (HCC) 07/23/2015  . Morbid obesity (HCC) 06/26/2015  . Chronic back pain 06/23/2015  . Rupture of ulnar collateral ligament of thumb 03/01/2014  . Memory difficulties 08/15/2013  . Diabetic neuropathy associated with type 2 diabetes mellitus (HCC) 08/15/2013  . GERD (gastroesophageal reflux disease) 07/19/2013  . GAD (generalized anxiety disorder) 07/19/2013  . Depression 07/19/2013  .  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 Postlaminectomy syndrome, lumbar region 07/05/2011  . Hyperlipidemia 12/30/2009  . SMOKER 12/30/2009  . DEEP VEIN THROMBOSIS/PHLEBITIS 12/30/2009  . Colitis, acute 05/27/2008  . Hypothyroidism 05/23/2008  . Type 2 diabetes mellitus treated with insulin (HCC) 05/23/2008  . Essential hypertension 05/23/2008  . History of CVA (cerebrovascular accident) 05/23/2008  . Asthma 05/23/2008  . FATTY LIVER DISEASE 05/23/2008  . ABDOMINAL PAIN 05/23/2008    Nedra Hai PT, DPT [phone]  [facility] [address] [place], [place], [address] Phone: [phone]   Fax:  [phone]  Name: Charles Pennington MRN: [id number] Date of Birth: 07-15-1965

## 2016-05-19 ENCOUNTER — Ambulatory Visit (HOSPITAL_COMMUNITY): Payer: Self-pay | Admitting: Physical Therapy

## 2016-05-20 ENCOUNTER — Ambulatory Visit (HOSPITAL_COMMUNITY): Payer: Medicare Other | Admitting: Physical Therapy

## 2016-05-20 DIAGNOSIS — M25552 Pain in left hip: Secondary | ICD-10-CM

## 2016-05-20 DIAGNOSIS — S31821D Laceration without foreign body of left buttock, subsequent encounter: Secondary | ICD-10-CM

## 2016-05-20 NOTE — Therapy (Signed)
[facility] [address] [place], [place], [address] Phone: [phone]   Fax:  [phone]  Wound Care Therapy  Patient Details  Name: Charles Pennington MRN: [id number] Date of Birth: 04/18/1965 Referring Provider: [name]   Encounter Date: 05/20/2016      PT End of Session - 05/20/16 1516    Visit Number 6   Number of Visits 13   Date for PT Re-Evaluation 05/26/16   Authorization Type Medicare/Medicaid    Authorization - Visit Number 6   Authorization - Number of Visits 10   PT Start Time 1438   PT Stop Time 1505   PT Time Calculation (min) 27 min   Activity Tolerance Patient tolerated treatment well   Behavior During Therapy [facility] for tasks assessed/performed      Past Medical History:  Diagnosis Date  . Anxiety   . Asthma   . Diabetes mellitus   . Diabetes mellitus without complication (HCC)   . Hypercholesteremia   . Hypertension   . Hypothyroidism   . Low back pain   . Obesity   . SOB (shortness of breath)   . Stroke [facility]) age 51  . Stroke (HCC)   . Vertigo     Past Surgical History:  Procedure Laterality Date  . BACK SURGERY  2013  . COLONOSCOPY  2009   Inflammatory changes of the cecum and ascending colon most consistent with infectious etiology, NSAID, ischemia. Suspected resolving infection based on symptomatology.  [facility] Kitchen FOOT SURGERY     Dr Ulice Brilliant  . SHOULDER ARTHROSCOPY Right   . SPINE SURGERY    . WRIST SURGERY      There were no vitals filed for this visit.                  Wound Therapy - 05/20/16 1513    Subjective Pt states he is doing good.  States he is changing it several times a day because the drainage odor.   Patient and Family Stated Goals wound to heal    Date of Onset --  February 2018   Prior Treatments stitches, general observation from doctors/referral to wound clinic    Pain Assessment No/denies pain   Evaluation and Treatment Procedures Explained to Patient/Family  Yes   Evaluation and Treatment Procedures agreed to   Wound Properties Date First Assessed: 04/28/16 Wound Type: Laceration Location: Buttocks Location Orientation: Left Wound Description (Comments): L gluteal laceration  Present on Admission: Yes   Dressing Type Gauze (Comment);Paper tape   Dressing Changed Changed   Dressing Status Old drainage   Dressing Change Frequency PRN   % Wound base Red or Granulating 95%   % Wound base Yellow/Fibrinous Exudate 5%   Margins Unattached edges (unapproximated)   Drainage Amount Moderate   Drainage Description Serosanguineous;No odor   Treatment Hydrotherapy (Pulse lavage)   Pulsed lavage therapy - wound location L gluteal wound    Pulsed Lavage with Suction (psi) 4 psi   Pulsed Lavage with Suction - Normal Saline Used 1000 mL   Pulsed Lavage Tip Tip with splash shield   Selective Debridement - Location wound borders   Selective Debridement - Tools Used Forceps;Scissors   Selective Debridement - Tissue Removed necrotic tissue    Wound Therapy - Clinical Statement Continued granualtion with only slough around the inferior wound borders at this point.  Irrigated well with pulsed lavage priior to debridment of wound borders and redressing with silver hydrofiber.  Secured with ABD  and medipore tape.  pt without pain during procedure.     Wound Therapy - Functional Problem List non-healing laceration    Factors Delaying/Impairing Wound Healing Diabetes Mellitus;Multiple medical problems;Polypharmacy;Tobacco use   Hydrotherapy Plan Pulsatile lavage with suction   Wound Therapy - Frequency 3X / week   Wound Therapy - Current Recommendations PT   Wound Plan pulsed lavage, packing with silver, debridement, ABD pads.  Measure next session.  Change dressing if environment changes to dryer consistency.                   PT Short Term Goals - 04/28/16 1409      PT SHORT TERM GOAL #1   Title Wound area to be 100% granulated and with no  slough/biofilm in order to show general improvement of condition    Time 2   Period Weeks   Status New     PT SHORT TERM GOAL #2   Title Wound borders to display healthy, healing tissue in order to demonstrate general improvement of condition    Time 2   Period Weeks   Status New     PT SHORT TERM GOAL #3   Title Wound depth to have decreased to 2cm along entire depth of wound in order to show general improvement of condition    Time 2   Period Weeks   Status New           PT Long Term Goals - 04/28/16 1410      PT LONG TERM GOAL #1   Title Wound to be less than 0.2cm deep along entire length of wound in order to show general improvement of condition    Time 4   Period Weeks   Status New     PT LONG TERM GOAL #2   Title Patient to be independent in applying appropriate wound dressing and skin care in order to facilitate full wound healing moving forwards on an independent basis    Time 4   Period Weeks   Status New             Patient will benefit from skilled therapeutic intervention in order to improve the following deficits and impairments:     Visit Diagnosis: Laceration of buttock with complication, left, subsequent encounter  Pain in left hip     Problem List Patient Active Problem List   Diagnosis Date Noted  . Multiple lacerations 04/04/2016  . Cocaine abuse 12/12/2015  . Pain medication agreement broken 12/12/2015  . Insomnia 10/28/2015  . Pain medication agreement signed 07/23/2015  . Opioid dependence (HCC) 07/23/2015  . Morbid obesity (HCC) 06/26/2015  . Chronic back pain 06/23/2015  . Rupture of ulnar collateral ligament of thumb 03/01/2014  . Memory difficulties 08/15/2013  . Diabetic neuropathy associated with type 2 diabetes mellitus (HCC) 08/15/2013  . GERD (gastroesophageal reflux disease) 07/19/2013  . GAD (generalized anxiety disorder) 07/19/2013  . Depression 07/19/2013  . Ataxia, late effect of cerebrovascular disease  07/05/2011  . Postlaminectomy syndrome, lumbar region 07/05/2011  . Hyperlipidemia 12/30/2009  . SMOKER 12/30/2009  . DEEP VEIN THROMBOSIS/PHLEBITIS 12/30/2009  . Colitis, acute 05/27/2008  . Hypothyroidism 05/23/2008  . Type 2 diabetes mellitus treated with insulin (HCC) 05/23/2008  . Essential hypertension 05/23/2008  . History of CVA (cerebrovascular accident) 05/23/2008  . Asthma 05/23/2008  . FATTY LIVER DISEASE 05/23/2008  . ABDOMINAL PAIN 05/23/2008    Lurena Nida, PTA/CLT [phone]  05/20/2016, 3:17 PM  [facility] [address] [place], [place], [address] Phone: [phone]   Fax:  [phone]  Name: OREL COOLER MRN: [id number] Date of Birth: Dec 04, 1965

## 2016-05-21 ENCOUNTER — Ambulatory Visit (INDEPENDENT_AMBULATORY_CARE_PROVIDER_SITE_OTHER): Payer: Medicare Other | Admitting: Family

## 2016-05-21 ENCOUNTER — Ambulatory Visit (HOSPITAL_COMMUNITY): Payer: Self-pay | Admitting: Physical Therapy

## 2016-05-21 ENCOUNTER — Encounter: Payer: Self-pay | Admitting: Family

## 2016-05-21 DIAGNOSIS — I609 Nontraumatic subarachnoid hemorrhage, unspecified: Secondary | ICD-10-CM

## 2016-05-21 DIAGNOSIS — Z5189 Encounter for other specified aftercare: Secondary | ICD-10-CM

## 2016-05-21 DIAGNOSIS — R42 Dizziness and giddiness: Secondary | ICD-10-CM | POA: Diagnosis not present

## 2016-05-21 DIAGNOSIS — F17200 Nicotine dependence, unspecified, uncomplicated: Secondary | ICD-10-CM | POA: Diagnosis not present

## 2016-05-21 DIAGNOSIS — R911 Solitary pulmonary nodule: Secondary | ICD-10-CM
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
## 2016-05-21 NOTE — Patient Instructions (Signed)
Subarachnoid Hemorrhage °Subarachnoid hemorrhage is bleeding in the area between the brain and the membrane that covers the brain. The bleeding puts more pressure on the brain and stops blood from reaching some areas of the brain. It is very serious. It may cause brain damage, stroke, or death if not treated. You must be treated in the hospital right away. °What increases the risk? °You may be more likely to have this condition if you: °· Smoke. °· Have high blood pressure (hypertension). °· Drink too much alcohol. °· Are a male, especially after menopause. °· Have a family history of disease in the blood vessels of the brain. °· Have a certain inherited kidney disease or connective tissue disease. °What are the signs or symptoms? °· Having a sudden, severe headache. °· Feeling sick to your stomach (nauseous) or throwing up (vomiting) combined with other problems. °· Suddenly feeling weak. °· Losing feeling on your face, arm, or leg, especially on one side of the body. °· Suddenly having trouble walking or moving your arms or legs. °· Suddenly feeling confused. °· Suddenly having a change in mood or personality. °· Having trouble talking or understanding. °· Having trouble swallowing. °· Suddenly having trouble seeing. °· Seeing double. °· Feeling dizzy. °· Losing your balance or coordination. °· Having light bother or hurt your eyes. °· Having a stiff neck. °Follow these instructions at home: °· Take all medicines exactly as told by your doctor. °· Eat healthy foods if you can swallow. °¨ Eat foods that are low in salt and cholesterol. °¨ Eat foods that are low in saturated and trans fat. °¨ If told, eat soft or pureed foods so that you do not choke. °¨ If told, take small bites of food so that you do not choke. °· Rest as told by your doctor. °· Limit your activity as told by your doctor. °· Do not smoke. °· Limit how much alcohol you drink. °¨ Men-drink no more than 2 drinks a day. °¨ Women who are not  pregnant-drink no more than 1 drink a day. °· Make changes to your lifestyle as told by your doctor. °· Keep track of your blood pressure as told by your doctor. °· Keep your home safe so you do not fall. °¨ Put grab bars in the bedroom and bathroom. °¨ Raise toilet seats. °¨ Put a seat in the shower. °· Go to therapy sessions as told by your doctor. This may include physical, occupational, and speech therapy. °· Use a walker or cane at all times, if told to do so. °· Keep all follow-up visits with your doctor and other specialists. °Get help right away if: °· You have a sudden, severe headache with no known cause. °· You are sick to your stomach or throw up, and have another problem. °· You have a sudden weakness. °· You lose feeling on one side of your body. °· You suddenly have trouble walking or moving arms or legs. °· You suddenly feel confused. °· You have trouble talking or understanding. °· You suddenly have trouble seeing. °· You lose your balance or your movements are not coordinated. °· You have a stiff neck. °· You have trouble breathing. °· You are partly or totally unaware of what is going on around you. °The symptoms above may be a sign of a serious problem that is an emergency. Do not wait to see if the symptoms will go away. Get medical help right away. Call your local emergency services (911 in [place]   Do not drive yourself to the hospital.  °This information is not intended to replace advice given to you by your health care provider. Make sure you discuss any questions you have with your health care provider. °Document Released: 05/29/2012 Document Revised: 07/10/2015 Document Reviewed: 03/17/2012 °Elsevier Interactive Patient Education © 2017 [employer] ° °

## 2016-05-24 ENCOUNTER — Ambulatory Visit (HOSPITAL_COMMUNITY): Payer: Medicare Other | Admitting: Physical Therapy

## 2016-05-24 DIAGNOSIS — M25552 Pain in left hip: Secondary | ICD-10-CM

## 2016-05-24 DIAGNOSIS — S31821D Laceration without foreign body of left buttock, subsequent encounter: Secondary | ICD-10-CM

## 2016-05-24 NOTE — Therapy (Signed)
[facility] [address] [place], [place], [address] Phone: [phone]   Fax:  [phone]  Wound Care Therapy  Patient Details  Name: Charles Pennington MRN: [id number] Date of Birth: 03/15/1965 Referring Provider: [name]   Encounter Date: 05/24/2016      PT End of Session - 05/24/16 1514    Visit Number 7   Number of Visits 13   Date for PT Re-Evaluation 05/26/16   Authorization Type Medicare/Medicaid    Authorization - Visit Number 7   Authorization - Number of Visits 10   PT Start Time 1435   PT Stop Time 1500   PT Time Calculation (min) 25 min   Activity Tolerance Patient tolerated treatment well   Behavior During Therapy [facility] for tasks assessed/performed      Past Medical History:  Diagnosis Date  . Anxiety   . Asthma   . Diabetes mellitus   . Diabetes mellitus without complication (HCC)   . Hypercholesteremia   . Hypertension   . Hypothyroidism   . Low back pain   . Obesity   . SOB (shortness of breath)   . Stroke [facility]) age 63  . Stroke (HCC)   . Vertigo     Past Surgical History:  Procedure Laterality Date  . BACK SURGERY  2013  . COLONOSCOPY  2009   Inflammatory changes of the cecum and ascending colon most consistent with infectious etiology, NSAID, ischemia. Suspected resolving infection based on symptomatology.  [facility] Kitchen FOOT SURGERY     Dr Ulice Brilliant  . SHOULDER ARTHROSCOPY Right   . SPINE SURGERY    . WRIST SURGERY      There were no vitals filed for this visit.                  Wound Therapy - 05/24/16 1508    Subjective Pt states that his wound is not draining like it was   Patient and Family Stated Goals wound to heal    Date of Onset --  February 2018   Prior Treatments stitches, general observation from doctors/referral to wound clinic    Pain Assessment No/denies pain   Evaluation and Treatment Procedures Explained to Patient/Family Yes   Evaluation and Treatment Procedures  agreed to   Wound Properties Date First Assessed: 04/28/16 Wound Type: Laceration Location: Buttocks Location Orientation: Left Wound Description (Comments): L gluteal laceration  Present on Admission: Yes   Dressing Type Gauze (Comment);Paper tape   Dressing Changed Changed   Dressing Status Old drainage   Dressing Change Frequency PRN   % Wound base Red or Granulating --  98   % Wound base Yellow/Fibrinous Exudate --  2   Wound Length (cm) 8.5 cm   Wound Width (cm) 3.5 cm   Undermining (cm) undermines laterally at least 7.0 cm    Margins Unattached edges (unapproximated)   Drainage Amount Moderate   Drainage Description Serosanguineous;No odor   Treatment Debridement (Selective)   Pulsed lavage therapy - wound location L gluteal wound    Pulsed Lavage with Suction (psi) --   Pulsed Lavage with Suction - Normal Saline Used --   Pulsed Lavage Tip --   Selective Debridement - Location wound borders   Selective Debridement - Tools Used Forceps;Scalpel;Scissors   Selective Debridement - Tissue Removed eschar, slough    Wound Therapy - Clinical Statement Pt wound is clean no longer needing pulse lavage.  Wound undermines greater than 7 cm laterally.  Pt may  benefit from a referral to a Engineer, petroleum.     Wound Therapy - Functional Problem List non-healing laceration    Factors Delaying/Impairing Wound Healing Diabetes Mellitus;Multiple medical problems;Polypharmacy;Tobacco use   Hydrotherapy Plan Debridement;Patient/family education   Wound Therapy - Frequency 3X / week   Wound Therapy - Current Recommendations PT   Wound Plan Continue to see pt twice a week.  Therapist will send [name] her thoughts re Engineer, petroleum.                     PT Short Term Goals - 04/28/16 1409      PT SHORT TERM GOAL #1   Title Wound area to be 100% granulated and with no slough/biofilm in order to show general improvement of condition    Time 2   Period Weeks   Status New      PT SHORT TERM GOAL #2   Title Wound borders to display healthy, healing tissue in order to demonstrate general improvement of condition    Time 2   Period Weeks   Status New     PT SHORT TERM GOAL #3   Title Wound depth to have decreased to 2cm along entire depth of wound in order to show general improvement of condition    Time 2   Period Weeks   Status New           PT Long Term Goals - 04/28/16 1410      PT LONG TERM GOAL #1   Title Wound to be less than 0.2cm deep along entire length of wound in order to show general improvement of condition    Time 4   Period Weeks   Status New     PT LONG TERM GOAL #2   Title Patient to be independent in applying appropriate wound dressing and skin care in order to facilitate full wound healing moving forwards on an independent basis    Time 4   Period Weeks   Status New               Plan - 05/24/16 1515    Clinical Impression Statement see above   PT Next Visit Plan continue debridement and dressing change       Patient will benefit from skilled therapeutic intervention in order to improve the following deficits and impairments:     Visit Diagnosis: Laceration of buttock with complication, left, subsequent encounter  Pain in left hip     Problem List Patient Active Problem List   Diagnosis Date Noted  . Multiple lacerations 04/04/2016  . Cocaine abuse 12/12/2015  . Pain medication agreement broken 12/12/2015  . Insomnia 10/28/2015  . Pain medication agreement signed 07/23/2015  . Opioid dependence (HCC) 07/23/2015  . Morbid obesity (HCC) 06/26/2015  . Chronic back pain 06/23/2015  . Rupture of ulnar collateral ligament of thumb 03/01/2014  . Memory difficulties 08/15/2013  . Diabetic neuropathy associated with type 2 diabetes mellitus (HCC) 08/15/2013  . GERD (gastroesophageal reflux disease) 07/19/2013  . GAD (generalized anxiety disorder) 07/19/2013  . Depression 07/19/2013  . Ataxia, late effect of  cerebrovascular disease 07/05/2011  . Postlaminectomy syndrome, lumbar region 07/05/2011  . Hyperlipidemia 12/30/2009  . SMOKER 12/30/2009  . DEEP VEIN THROMBOSIS/PHLEBITIS 12/30/2009  . Colitis, acute 05/27/2008  . Hypothyroidism 05/23/2008  . Type 2 diabetes mellitus treated with insulin (HCC) 05/23/2008  . Essential hypertension 05/23/2008  . History of CVA (cerebrovascular accident) 05/23/2008  . Asthma 05/23/2008  .  FATTY LIVER DISEASE 05/23/2008  . ABDOMINAL PAIN 05/23/2008   Virgina Organ, PT CLT [phone] 05/24/2016, 3:17 PM  [facility] [address] [place], [place], [address] Phone: [phone]   Fax:  [phone]  Name: Charles Pennington MRN: [id number] Date of Birth: 27-Sep-1965

## 2016-05-25 ENCOUNTER — Telehealth: Payer: Self-pay

## 2016-05-25 MED ORDER — SUVOREXANT 10 MG PO TABS: 10.0000 mg | ORAL_TABLET | Freq: Every evening | ORAL | 3 refills | Status: DC | PRN

## 2016-05-25 NOTE — Telephone Encounter (Signed)
Added Belsomra to medication list

## 2016-05-26 ENCOUNTER — Ambulatory Visit (HOSPITAL_COMMUNITY): Payer: Self-pay | Admitting: Physical Therapy

## 2016-05-26 ENCOUNTER — Other Ambulatory Visit: Payer: Self-pay | Admitting: Family

## 2016-05-26 ENCOUNTER — Ambulatory Visit (HOSPITAL_BASED_OUTPATIENT_CLINIC_OR_DEPARTMENT_OTHER)
Admission: RE | Admit: 2016-05-26 | Discharge: 2016-05-26 | Disposition: A | Discharge status: Home / Self Care | Payer: Medicare Other | Source: Ambulatory Visit | Attending: Family | Admitting: Family

## 2016-05-26 DIAGNOSIS — I638 Other cerebral infarction: Secondary | ICD-10-CM | POA: Insufficient documentation

## 2016-05-26 DIAGNOSIS — R42 Dizziness and giddiness: Secondary | ICD-10-CM | POA: Diagnosis not present

## 2016-05-26 DIAGNOSIS — S066X0A Traumatic subarachnoid hemorrhage without loss of consciousness, initial encounter: Secondary | ICD-10-CM | POA: Diagnosis not present

## 2016-05-26 DIAGNOSIS — I609 Nontraumatic subarachnoid hemorrhage, unspecified: Secondary | ICD-10-CM

## 2016-05-26 DIAGNOSIS — S31821S Laceration without foreign body of left buttock, sequela: Secondary | ICD-10-CM

## 2016-05-26 NOTE — Progress Notes (Signed)
Physical therapist working with Charles Pennington states pt's wound tunnels laterally over 7cm. Will do referral to surgery.

## 2016-05-27 ENCOUNTER — Ambulatory Visit: Payer: Medicare Other

## 2016-05-27 ENCOUNTER — Ambulatory Visit (HOSPITAL_COMMUNITY): Payer: Medicare Other | Admitting: Physical Therapy

## 2016-05-27 DIAGNOSIS — M25552 Pain in left hip: Secondary | ICD-10-CM

## 2016-05-27 DIAGNOSIS — S31821D Laceration without foreign body of left buttock, subsequent encounter: Secondary | ICD-10-CM | POA: Diagnosis not present

## 2016-05-27 NOTE — Therapy (Signed)
[facility] [address] [place], [place], [address] Phone: [phone]   Fax:  [phone]  Wound Care Therapy  Patient Details  Name: Charles Pennington MRN: [id number] Date of Birth: 1965-07-11 Referring Provider: [name]   Encounter Date: 05/27/2016    Past Medical History:  Diagnosis Date  . Anxiety   . Asthma   . Diabetes mellitus   . Diabetes mellitus without complication (HCC)   . Hypercholesteremia   . Hypertension   . Hypothyroidism   . Low back pain   . Obesity   . SOB (shortness of breath)   . Stroke [facility]) age 51  . Stroke (HCC)   . Vertigo     Past Surgical History:  Procedure Laterality Date  . BACK SURGERY  2013  . COLONOSCOPY  2009   Inflammatory changes of the cecum and ascending colon most consistent with infectious etiology, NSAID, ischemia. Suspected resolving infection based on symptomatology.  [facility] FOOT SURGERY     Dr Ulice Brilliant  . SHOULDER ARTHROSCOPY Right   . SPINE SURGERY    . WRIST SURGERY      There were no vitals filed for this visit.                  Wound Therapy - 05/27/16 1553    Subjective Pt states he is awaiting his MD to make consult with surgeon.   No pain but states he has soreness in his Lt glute down toward his hip.   Patient and Family Stated Goals wound to heal    Date of Onset --  February 2018   Prior Treatments stitches, general observation from doctors/referral to wound clinic    Evaluation and Treatment Procedures Explained to Patient/Family Yes   Evaluation and Treatment Procedures agreed to   Wound Properties Date First Assessed: 04/28/16 Wound Type: Laceration Location: Buttocks Location Orientation: Left Wound Description (Comments): L gluteal laceration  Present on Admission: Yes   Dressing Type Paper tape;Gauze (Comment)   Dressing Changed Changed   Dressing Status Old drainage   Dressing Change Frequency PRN   % Wound base Red or Granulating --  98    % Wound base Yellow/Fibrinous Exudate --  2   Margins Unattached edges (unapproximated)   Drainage Amount Moderate   Drainage Description Serosanguineous;No odor   Treatment Cleansed   Wound Therapy - Clinical Statement fully granulated but with unknown depth down towards Lt hip.  Noted induration and tenderness toward his Lt hip.  No signs of infection, however.  Cleansed and repacked with silver hydrofiber.  Secured with ABD and medipore tape.  discussed with evaluating therapist that patient may benefit from wound vac to assist with closure.     Wound Therapy - Functional Problem List non-healing laceration    Factors Delaying/Impairing Wound Healing Diabetes Mellitus;Multiple medical problems;Polypharmacy;Tobacco use   Hydrotherapy Plan Debridement;Patient/family education   Wound Therapy - Frequency 3X / week   Wound Therapy - Current Recommendations PT   Wound Plan continue per evaluating therapist POC.  Change dressings as needed.  Follow up with patient regarding further referrals per MD.                   PT Short Term Goals - 04/28/16 1409      PT SHORT TERM GOAL #1   Title Wound area to be 100% granulated and with no slough/biofilm in order to show general improvement of condition    Time 2  Period Weeks   Status New     PT SHORT TERM GOAL #2   Title Wound borders to display healthy, healing tissue in order to demonstrate general improvement of condition    Time 2   Period Weeks   Status New     PT SHORT TERM GOAL #3   Title Wound depth to have decreased to 2cm along entire depth of wound in order to show general improvement of condition    Time 2   Period Weeks   Status New           PT Long Term Goals - 04/28/16 1410      PT LONG TERM GOAL #1   Title Wound to be less than 0.2cm deep along entire length of wound in order to show general improvement of condition    Time 4   Period Weeks   Status New     PT LONG TERM GOAL #2   Title Patient  to be independent in applying appropriate wound dressing and skin care in order to facilitate full wound healing moving forwards on an independent basis    Time 4   Period Weeks   Status New             Patient will benefit from skilled therapeutic intervention in order to improve the following deficits and impairments:     Visit Diagnosis: Laceration of buttock with complication, left, subsequent encounter  Pain in left hip     Problem List Patient Active Problem List   Diagnosis Date Noted  . Multiple lacerations 04/04/2016  . Cocaine abuse 12/12/2015  . Pain medication agreement broken 12/12/2015  . Insomnia 10/28/2015  . Pain medication agreement signed 07/23/2015  . Opioid dependence (HCC) 07/23/2015  . Morbid obesity (HCC) 06/26/2015  . Chronic back pain 06/23/2015  . Rupture of ulnar collateral ligament of thumb 03/01/2014  . Memory difficulties 08/15/2013  . Diabetic neuropathy associated with type 2 diabetes mellitus (HCC) 08/15/2013  . GERD (gastroesophageal reflux disease) 07/19/2013  . GAD (generalized anxiety disorder) 07/19/2013  . Depression 07/19/2013  . 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 Postlaminectomy syndrome, lumbar region 07/05/2011  . Hyperlipidemia 12/30/2009  . SMOKER 12/30/2009  . DEEP VEIN THROMBOSIS/PHLEBITIS 12/30/2009  . Colitis, acute 05/27/2008  . Hypothyroidism 05/23/2008  . Type 2 diabetes mellitus treated with insulin (HCC) 05/23/2008  . Essential hypertension 05/23/2008  . History of CVA (cerebrovascular accident) 05/23/2008  . Asthma 05/23/2008  . FATTY LIVER DISEASE 05/23/2008  . ABDOMINAL PAIN 05/23/2008    Lurena Nida, PTA/CLT [phone]  05/27/2016, 3:58 PM  [facility] [address] [place], [place], [address] Phone: [phone]   Fax:  [phone]  Name: VALDEZ BRANNAN MRN: [id number] Date of Birth: [birth year]

## 2016-05-28 ENCOUNTER — Ambulatory Visit (HOSPITAL_COMMUNITY): Payer: Self-pay

## 2016-05-31 ENCOUNTER — Encounter: Payer: Self-pay | Admitting: Family

## 2016-06-03 ENCOUNTER — Ambulatory Visit (HOSPITAL_COMMUNITY): Payer: Medicare Other

## 2016-06-03 ENCOUNTER — Telehealth (HOSPITAL_COMMUNITY): Payer: Self-pay

## 2016-06-03 DIAGNOSIS — S31801A Laceration without foreign body of unspecified buttock, initial encounter: Secondary | ICD-10-CM | POA: Diagnosis not present

## 2016-06-03 NOTE — Telephone Encounter (Signed)
No show, called pt who reported was in plastic surgeon apt today.  Reports he has an apt with plastic surgeon in 2 weeks.  Pt unsure with POC with plastic surgeon.  Encouraged pt to call plastic surgeon and get information/understanding for POC.  Reminded next apt scheduled.    [address] LPTA; CBIS [phone]

## 2016-06-04 ENCOUNTER — Telehealth: Payer: Self-pay | Admitting: Family

## 2016-06-04 NOTE — Telephone Encounter (Signed)
Patient given advise about his supplies for his wound.

## 2016-06-08 ENCOUNTER — Ambulatory Visit: Payer: Medicare Other | Admitting: Family

## 2016-06-10 ENCOUNTER — Telehealth (HOSPITAL_COMMUNITY): Payer: Self-pay | Admitting: Physical Therapy

## 2016-06-10 ENCOUNTER — Ambulatory Visit (HOSPITAL_COMMUNITY): Payer: Medicare Other | Admitting: Physical Therapy

## 2016-06-10 ENCOUNTER — Encounter: Payer: Self-pay | Admitting: Family

## 2016-06-10 NOTE — Telephone Encounter (Signed)
Pt did not show for appointment.  Called and left message to call clinic regarding missed appointment and informed he has no further appointments scheduled.  Asked patient to call clinic regarding this.  Lurena Nida, PTA/CLT [phone]

## 2016-06-15 ENCOUNTER — Encounter: Payer: Self-pay | Admitting: Family

## 2016-06-15 ENCOUNTER — Ambulatory Visit (INDEPENDENT_AMBULATORY_CARE_PROVIDER_SITE_OTHER): Payer: Medicare Other | Admitting: Family

## 2016-06-15 VITALS — BP 103/70 | HR 102 | Temp 99.0°F | Ht 68.0 in | Wt 232.4 lb

## 2016-06-15 DIAGNOSIS — Z794 Long term (current) use of insulin: Secondary | ICD-10-CM | POA: Diagnosis not present

## 2016-06-15 DIAGNOSIS — G8921 Chronic pain due to trauma: Secondary | ICD-10-CM | POA: Diagnosis not present

## 2016-06-15 DIAGNOSIS — E119 Type 2 diabetes mellitus without complications: Secondary | ICD-10-CM | POA: Diagnosis not present

## 2016-06-15 DIAGNOSIS — Z5181 Encounter for therapeutic drug level monitoring: Secondary | ICD-10-CM | POA: Diagnosis not present

## 2016-06-15 DIAGNOSIS — F17200 Nicotine dependence, unspecified, uncomplicated: Secondary | ICD-10-CM | POA: Diagnosis not present

## 2016-06-15 DIAGNOSIS — S01512S Laceration without foreign body of oral cavity, sequela: Secondary | ICD-10-CM

## 2016-06-15 DIAGNOSIS — M792 Neuralgia and neuritis, unspecified: Secondary | ICD-10-CM | POA: Diagnosis not present

## 2016-06-15 DIAGNOSIS — Z79899 Other long term (current) drug therapy: Secondary | ICD-10-CM | POA: Diagnosis not present

## 2016-06-15 DIAGNOSIS — E1121 Type 2 diabetes mellitus with diabetic nephropathy: Secondary | ICD-10-CM | POA: Diagnosis not present

## 2016-06-15 DIAGNOSIS — Z8673 Personal history of transient ischemic attack (TIA), and cerebral infarction without residual deficits: Secondary | ICD-10-CM | POA: Diagnosis not present

## 2016-06-15 DIAGNOSIS — R062 Wheezing: Secondary | ICD-10-CM

## 2016-06-15 MED ORDER — DOXYCYCLINE HYCLATE 100 MG PO TABS: 100.0000 mg | ORAL_TABLET | Freq: Two times a day (BID) | ORAL | 0 refills | Status: DC

## 2016-06-15 NOTE — Patient Instructions (Signed)
Laceration Care, Adult A laceration is a cut that goes through all layers of the skin. The cut also goes into the tissue that is right under the skin. Some cuts heal on their own. Others need to be closed with stitches (sutures), staples, skin adhesive strips, or wound glue. Taking care of your cut lowers your risk of infection and helps your cut to heal better. How to take care of your cut For stitches or staples:   Keep the wound clean and dry.  If you were given a bandage (dressing), you should change it at least one time per day or as told by your doctor. You should also change it if it gets wet or dirty.  Keep the wound completely dry for the first 24 hours or as told by your doctor. After that time, you may take a shower or a bath. However, make sure that the wound is not soaked in water until after the stitches or staples have been removed.  Clean the wound one time each day or as told by your doctor:  Wash the wound with soap and water.  Rinse the wound with water until all of the soap comes off.  Pat the wound dry with a clean towel. Do not rub the wound.  After you clean the wound, put a thin layer of antibiotic ointment on it as told by your doctor. This ointment:  Helps to prevent infection.  Keeps the bandage from sticking to the wound.  Have your stitches or staples removed as told by your doctor. If your doctor used skin adhesive strips:   Keep the wound clean and dry.  If you were given a bandage, you should change it at least one time per day or as told by your doctor. You should also change it if it gets dirty or wet.  Do not get the skin adhesive strips wet. You can take a shower or a bath, but be careful to keep the wound dry.  If the wound gets wet, pat it dry with a clean towel. Do not rub the wound.  Skin adhesive strips fall off on their own. You can trim the strips as the wound heals. Do not remove any strips that are still stuck to the wound. They will  fall off after a while. If your doctor used wound glue:   Try to keep your wound dry, but you may briefly wet it in the shower or bath. Do not soak the wound in water, such as by swimming.  After you take a shower or a bath, gently pat the wound dry with a clean towel. Do not rub the wound.  Do not do any activities that will make you really sweaty until the skin glue has fallen off on its own.  Do not apply liquid, cream, or ointment medicine to your wound while the skin glue is still on.  If you were given a bandage, you should change it at least one time per day or as told by your doctor. You should also change it if it gets dirty or wet.  If a bandage is placed over the wound, do not let the tape for the bandage touch the skin glue.  Do not pick at the glue. The skin glue usually stays on for 5-10 days. Then, it falls off of the skin. General Instructions   To help prevent scarring, make sure to cover your wound with sunscreen whenever you are outside after stitches are removed, after adhesive   strips are removed, or when wound glue stays in place and the wound is healed. Make sure to wear a sunscreen of at least 30 SPF.  Take over-the-counter and prescription medicines only as told by your doctor.  If you were given antibiotic medicine or ointment, take or apply it as told by your doctor. Do not stop using the antibiotic even if your wound is getting better.  Do not scratch or pick at the wound.  Keep all follow-up visits as told by your doctor. This is important.  Check your wound every day for signs of infection. Watch for:  Redness, swelling, or pain.  Fluid, blood, or pus.  Raise (elevate) the injured area above the level of your heart while you are sitting or lying down, if possible. Get help if:  You got a tetanus shot and you have any of these problems at the injection site:  Swelling.  Very bad pain.  Redness.  Bleeding.  You have a fever.  A wound that  was closed breaks open.  You notice a bad smell coming from your wound or your bandage.  You notice something coming out of the wound, such as wood or glass.  Medicine does not help your pain.  You have more redness, swelling, or pain at the site of your wound.  You have fluid, blood, or pus coming from your wound.  You notice a change in the color of your skin near your wound.  You need to change the bandage often because fluid, blood, or pus is coming from the wound.  You start to have a new rash.  You start to have numbness around the wound. Get help right away if:  You have very bad swelling around the wound.  Your pain suddenly gets worse and is very bad.  You notice painful lumps near the wound or on skin that is anywhere on your body.  You have a red streak going away from your wound.  The wound is on your hand or foot and you cannot move a finger or toe like you usually can.  The wound is on your hand or foot and you notice that your fingers or toes look pale or bluish. This information is not intended to replace advice given to you by your health care provider. Make sure you discuss any questions you have with your health care provider. Document Released: 07/21/2007 Document Revised: 07/10/2015 Document Reviewed: 01/28/2014 Elsevier Interactive Patient Education  2017 Elsevier Inc.  
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
## 2016-06-23 ENCOUNTER — Other Ambulatory Visit: Payer: Self-pay | Admitting: Family

## 2016-06-24 ENCOUNTER — Telehealth (HOSPITAL_COMMUNITY): Payer: Self-pay | Admitting: Physical Therapy

## 2016-06-24 ENCOUNTER — Ambulatory Visit (HOSPITAL_COMMUNITY): Payer: Medicare Other | Attending: Family | Admitting: Physical Therapy

## 2016-06-24 DIAGNOSIS — S31821D Laceration without foreign body of left buttock, subsequent encounter: Secondary | ICD-10-CM | POA: Insufficient documentation

## 2016-06-24 DIAGNOSIS — M25552 Pain in left hip: Secondary | ICD-10-CM

## 2016-06-24 NOTE — Telephone Encounter (Signed)
[name] referring provider. Discussed patient's wound status and concern about depth/unknown extent of undermining as well as difficulty in transportation to wound care. Also discussed wound vac, which referring provider is agreeable to; educated that we will just need her to sign order for the wound vac (will be faxed over) and then we will refer to an external company that will fit/apply the vac.  Nedra HaiKristen Unger PT, DPT [phone]

## 2016-06-24 NOTE — Therapy (Signed)
[facility] [address] [place], [place], [address] Phone: [phone]   Fax:  [phone]  Wound Care Therapy (Re-Assessment)  Patient Details  Name: Charles Pennington MRN: [id number] Date of Birth: May 02, 1965 Referring Provider: [name]   Encounter Date: 06/24/2016      PT End of Session - 06/24/16 1530    Visit Number 8   Number of Visits 14   Date for PT Re-Evaluation 07/29/16   Authorization Type Medicare/Medicaid    PT Start Time 1500   PT Stop Time 1520   PT Time Calculation (min) 20 min   Activity Tolerance Patient tolerated treatment well   Behavior During Therapy [facility] for tasks assessed/performed      Past Medical History:  Diagnosis Date  . Anxiety   . Asthma   . Diabetes mellitus   . Diabetes mellitus without complication (HCC)   . Hypercholesteremia   . Hypertension   . Hypothyroidism   . Low back pain   . Obesity   . SOB (shortness of breath)   . Stroke [facility]) age 51  . Stroke (HCC)   . Vertigo     Past Surgical History:  Procedure Laterality Date  . BACK SURGERY  2013  . COLONOSCOPY  2009   Inflammatory changes of the cecum and ascending colon most consistent with infectious etiology, NSAID, ischemia. Suspected resolving infection based on symptomatology.  [facility] Kitchen FOOT SURGERY     Dr Ulice Brilliant  . SHOULDER ARTHROSCOPY Right   . SPINE SURGERY    . WRIST SURGERY      There were no vitals filed for this visit.            Pain 0/10 this session       Wound Therapy - 06/24/16 1525    Subjective Patient arrives stating he went to some doctor who packed his wound with wet gauze and it was really painful, he pulled it out the second day it was in. No major changes otherwise    Patient and Family Stated Goals wound to heal    Date of Onset --  February 2018   Prior Treatments stitches, general observation from doctors/referral to wound clinic    Evaluation and Treatment Procedures Explained to  Patient/Family Yes; pain 0/10   Evaluation and Treatment Procedures agreed to   Wound Properties Date First Assessed: 04/28/16 Wound Type: Laceration Location: Buttocks Location Orientation: Left Wound Description (Comments): L gluteal laceration  Present on Admission: Yes   Dressing Type Gauze (Comment);Paper tape   Dressing Changed Changed   Dressing Status Old drainage   Dressing Change Frequency PRN   % Wound base Red or Granulating 95%   % Wound base Yellow/Fibrinous Exudate 5%   Wound Length (cm) 9 cm   Wound Width (cm) 3.5 cm   Wound Depth (cm) 6 cm  at minimum, may be deeper but concerned to push further    Margins Epibole (rolled edges)   Drainage Amount Scant   Drainage Description Serosanguineous;No odor   Treatment Cleansed;Packing (Impregnated strip);Other (Comment)  silver hydrofiber    Wound Therapy - Clinical Statement Patient arrives after being out of PT for approximately one month due to difficulty with transportation; re-measurement taken today with no significant change in wound status and considerable depth with wound having at least 6cm of undermining and likely more- extent of undermining unknown due to PT concern of pushing probe this deep into wound. Patient would likely receive more benefit from wound  vac than our services however will plan to continue packing/dressing wound until we are able to solidify POC with MD moving forward.    Wound Therapy - Functional Problem List non-healing laceration    Factors Delaying/Impairing Wound Healing Diabetes Mellitus;Multiple medical problems;Polypharmacy;Tobacco use   Hydrotherapy Plan Debridement;Patient/family education   Wound Therapy - Frequency Other (comment)  2x/week    Wound Therapy - Current Recommendations Other (comment)  wound vac recommended    Wound Plan continue packing, continue to attempt to facilitate wound vac through referring MD                  PT Education - 07/13/16 1530    Education  provided Yes   Education Details we will contact Dr. Nickola Major about wound vac, continue to come to PT for packing until told otherwise, schedule more sessions, benefits of wound vac    Person(s) Educated Patient   Methods Explanation   Comprehension Verbalized understanding          PT Short Term Goals - 07-13-16 1531      PT SHORT TERM GOAL #1   Title Wound area to be 100% granulated and with no slough/biofilm in order to show general improvement of condition    Time 2   Period Weeks   Status On-going     PT SHORT TERM GOAL #2   Title Wound borders to display healthy, healing tissue in order to demonstrate general improvement of condition    Time 2   Period Weeks   Status On-going     PT SHORT TERM GOAL #3   Title Wound depth to have decreased to 2cm along entire depth of wound in order to show general improvement of condition    Time 2   Period Weeks   Status On-going           PT Long Term Goals - Jul 13, 2016 1531      PT LONG TERM GOAL #1   Title Wound to be less than 0.2cm deep along entire length of wound in order to show general improvement of condition    Time 4   Period Weeks   Status On-going     PT LONG TERM GOAL #2   Title Patient to be independent in applying appropriate wound dressing and skin care in order to facilitate full wound healing moving forwards on an independent basis    Time 4   Period Weeks   Status On-going             Patient will benefit from skilled therapeutic intervention in order to improve the following deficits and impairments:     Visit Diagnosis: Laceration of buttock with complication, left, subsequent encounter - Plan: PT plan of care cert/re-cert  Pain in left hip - Plan: PT plan of care cert/re-cert      G-Codes - 07-13-16 1532    Functional Assessment Tool Used (Outpatient Only) Based on wound healing status, pain patterns, wound presentation    Functional Limitation Other PT primary   Other PT Primary Current  Status (U9811) At least 40 percent but less than 60 percent impaired, limited or restricted   Other PT Primary Goal Status (B1478) At least 20 percent but less than 40 percent impaired, limited or restricted       Problem List Patient Active Problem List   Diagnosis Date Noted  . Multiple lacerations 04/04/2016  . Cocaine abuse 12/12/2015  . Pain medication agreement broken 12/12/2015  . Insomnia 10/28/2015  .  Pain medication agreement signed 07/23/2015  . Opioid dependence (HCC) 07/23/2015  . Morbid obesity (HCC) 06/26/2015  . Chronic back pain 06/23/2015  . Rupture of ulnar collateral ligament of thumb 03/01/2014  . Memory difficulties 08/15/2013  . Diabetic neuropathy associated with type 2 diabetes mellitus (HCC) 08/15/2013  . GERD (gastroesophageal reflux disease) 07/19/2013  . GAD (generalized anxiety disorder) 07/19/2013  . Depression 07/19/2013  . 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 Postlaminectomy syndrome, lumbar region 07/05/2011  . Hyperlipidemia 12/30/2009  . SMOKER 12/30/2009  . DEEP VEIN THROMBOSIS/PHLEBITIS 12/30/2009  . Colitis, acute 05/27/2008  . Hypothyroidism 05/23/2008  . Type 2 diabetes mellitus treated with insulin (HCC) 05/23/2008  . Essential hypertension 05/23/2008  . History of CVA (cerebrovascular accident) 05/23/2008  . Asthma 05/23/2008  . FATTY LIVER DISEASE 05/23/2008  . ABDOMINAL PAIN 05/23/2008    Nedra HaiKristen Adriyana Greenbaum PT, DPT [phone]  [facility] [address] [place], [place], [address] Phone: [phone]   Fax:  [phone]  Name: Charles Pennington MRN: [id number] Date of Birth: [birth year]

## 2016-06-29 ENCOUNTER — Ambulatory Visit (HOSPITAL_COMMUNITY): Payer: Medicare Other | Admitting: Physical Therapy

## 2016-06-29 DIAGNOSIS — M25552 Pain in left hip: Secondary | ICD-10-CM | POA: Diagnosis not present

## 2016-06-29 DIAGNOSIS — S31821D Laceration without foreign body of left buttock, subsequent encounter: Secondary | ICD-10-CM | POA: Diagnosis not present

## 2016-06-29 NOTE — Therapy (Signed)
[facility] [address] [place], [place], [address] Phone: [phone]   Fax:  [phone]  Wound Care Therapy  Patient Details  Name: Charles Pennington MRN: [id number] Date of Birth: [birth year] Referring Provider: [name]   Encounter Date: 06/29/2016      PT End of Session - 06/29/16 0844    Visit Number 9   Number of Visits 14   Date for PT Re-Evaluation 07/29/16   Authorization Type Medicare/Medicaid (G-codes done 8th session)   Authorization - Visit Number 9   Authorization - Number of Visits 18   PT Start Time [phone]   PT Stop Time 0834   PT Time Calculation (min) 15 min   Activity Tolerance Patient tolerated treatment well   Behavior During Therapy [facility] for tasks assessed/performed      Past Medical History:  Diagnosis Date  . Anxiety   . Asthma   . Diabetes mellitus   . Diabetes mellitus without complication (HCC)   . Hypercholesteremia   . Hypertension   . Hypothyroidism   . Low back pain   . Obesity   . SOB (shortness of breath)   . Stroke [facility]) age 89  . Stroke (HCC)   . Vertigo     Past Surgical History:  Procedure Laterality Date  . BACK SURGERY  2013  . COLONOSCOPY  2009   Inflammatory changes of the cecum and ascending colon most consistent with infectious etiology, NSAID, ischemia. Suspected resolving infection based on symptomatology.  Marland Kitchen FOOT SURGERY     Dr Ulice Brilliant  . SHOULDER ARTHROSCOPY Right   . SPINE SURGERY    . WRIST SURGERY      There were no vitals filed for this visit.                  Wound Therapy - 06/29/16 9528    Subjective Patient arrives today having taken dressing off, reports he did it on his own for no reason. Otherwise no major changes.    Patient and Family Stated Goals wound to heal    Date of Onset --  February 2018   Prior Treatments stitches, general observation from doctors/referral to wound clinic    Pain Assessment 0-10   Pain Score 5    Pain  Location Buttocks   Pain Orientation Left   Evaluation and Treatment Procedures Explained to Patient/Family Yes   Evaluation and Treatment Procedures agreed to   Wound Properties Date First Assessed: 04/28/16 Wound Type: Laceration Location: Buttocks Location Orientation: Left Wound Description (Comments): L gluteal laceration  Present on Admission: Yes   Dressing Type None   Dressing Changed Other (Comment)  none-patient had removed it    Dressing Status Dry   Dressing Change Frequency PRN   Margins Epibole (rolled edges)   Drainage Amount Scant   Drainage Description Serosanguineous;No odor   Treatment Cleansed;Packing (Impregnated strip)  cleansed, packed with silver    Wound Therapy - Clinical Statement Patient arrives today having taken off clinic dressing and pulled out silver on his own; he reports "there was a lot in there" and cannot give any reason as to why he took it out on his own. Educated patient regarding referring provider being agreeable to wound vac placement- we are just waiting on them to return signed order and then we will DC to provider who will fit and place wound vac; also educated on importance of keeping dressing/packing in place to care for wound rather than pulling  it out on his own. Cleansed skin around wound and packed wound with silver hydrofiber. Will continue to pack/change dressing on wound 1-2x/week until we have signed wound vac order, then will plan to DC.    Wound Therapy - Functional Problem List non-healing laceration    Factors Delaying/Impairing Wound Healing Diabetes Mellitus;Multiple medical problems;Polypharmacy;Tobacco use   Hydrotherapy Plan Debridement;Patient/family education   Wound Therapy - Frequency Other (comment)  1-2x/week until MD signs wound vac order    Wound Therapy - Current Recommendations Other (comment);PT  1-2 times per week until MD signs wound vac order    Wound Plan continue packing until wound vac order is signed by  referring provider, then DC                  PT Education - 06/29/16 0843    Education provided Yes   Education Details referring provider [facility]([name]) is agreeable to wound vac placement; we are currently waiting for signed order to be returned, once we have it we will DC from PT and refer to wound vac company for fitting and placement. Continue to come to PT until we have order; do not remove dressings/packing on his own unless they fall out/off on thier own    Person(s) Educated Patient   Methods Explanation   Comprehension Verbalized understanding;Need further instruction          PT Short Term Goals - 06/24/16 1531      PT SHORT TERM GOAL #1   Title Wound area to be 100% granulated and with no slough/biofilm in order to show general improvement of condition    Time 2   Period Weeks   Status On-going     PT SHORT TERM GOAL #2   Title Wound borders to display healthy, healing tissue in order to demonstrate general improvement of condition    Time 2   Period Weeks   Status On-going     PT SHORT TERM GOAL #3   Title Wound depth to have decreased to 2cm along entire depth of wound in order to show general improvement of condition    Time 2   Period Weeks   Status On-going           PT Long Term Goals - 06/24/16 1531      PT LONG TERM GOAL #1   Title Wound to be less than 0.2cm deep along entire length of wound in order to show general improvement of condition    Time 4   Period Weeks   Status On-going     PT LONG TERM GOAL #2   Title Patient to be independent in applying appropriate wound dressing and skin care in order to facilitate full wound healing moving forwards on an independent basis    Time 4   Period Weeks   Status On-going             Patient will benefit from skilled therapeutic intervention in order to improve the following deficits and impairments:     Visit Diagnosis: Laceration of buttock with complication, left, subsequent  encounter  Pain in left hip     Problem List Patient Active Problem List   Diagnosis Date Noted  . Multiple lacerations 04/04/2016  . Cocaine abuse 12/12/2015  . Pain medication agreement broken 12/12/2015  . Insomnia 10/28/2015  . Pain medication agreement signed 07/23/2015  . Opioid dependence (HCC) 07/23/2015  . Morbid obesity (HCC) 06/26/2015  . Chronic back pain 06/23/2015  .  Rupture of ulnar collateral ligament of thumb 03/01/2014  . Memory difficulties 08/15/2013  . Diabetic neuropathy associated with type 2 diabetes mellitus (HCC) 08/15/2013  . GERD (gastroesophageal reflux disease) 07/19/2013  . GAD (generalized anxiety disorder) 07/19/2013  . Depression 07/19/2013  . 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 Postlaminectomy syndrome, lumbar region 07/05/2011  . Hyperlipidemia 12/30/2009  . SMOKER 12/30/2009  . DEEP VEIN THROMBOSIS/PHLEBITIS 12/30/2009  . Colitis, acute 05/27/2008  . Hypothyroidism 05/23/2008  . Type 2 diabetes mellitus treated with insulin (HCC) 05/23/2008  . Essential hypertension 05/23/2008  . History of CVA (cerebrovascular accident) 05/23/2008  . Asthma 05/23/2008  . FATTY LIVER DISEASE 05/23/2008  . ABDOMINAL PAIN 05/23/2008    Nedra Hai PT, DPT [phone]  [facility] [address] [place], [place], [address] Phone: [phone]   Fax:  [phone]  Name: Charles Pennington MRN: [id number] Date of Birth: October 07, 1965

## 2016-07-01 ENCOUNTER — Telehealth: Payer: Self-pay | Admitting: Family

## 2016-07-01 ENCOUNTER — Telehealth (HOSPITAL_COMMUNITY): Payer: Self-pay | Admitting: Physical Therapy

## 2016-07-01 ENCOUNTER — Telehealth: Payer: Self-pay

## 2016-07-01 ENCOUNTER — Other Ambulatory Visit: Payer: Self-pay | Admitting: Family

## 2016-07-01 ENCOUNTER — Ambulatory Visit (HOSPITAL_COMMUNITY): Payer: Medicare Other

## 2016-07-01 DIAGNOSIS — M25552 Pain in left hip: Secondary | ICD-10-CM | POA: Diagnosis not present

## 2016-07-01 DIAGNOSIS — S31821D Laceration without foreign body of left buttock, subsequent encounter: Secondary | ICD-10-CM | POA: Diagnosis not present

## 2016-07-01 DIAGNOSIS — S31821S Laceration without foreign body of left buttock, sequela: Secondary | ICD-10-CM

## 2016-07-01 DIAGNOSIS — J449 Chronic obstructive pulmonary disease, unspecified: Secondary | ICD-10-CM

## 2016-07-01 NOTE — Telephone Encounter (Signed)
PTA able to contact MD office and start process on their end of getting HHPT referral/initiation set up today. [facility] and left message regarding this.  Nedra HaiKristen Unger PT, DPT [phone]

## 2016-07-01 NOTE — Telephone Encounter (Signed)
[facility] DPT today; generally touched base on POC for DC when HHPT starts, also educated that we are working with [employer] with [employer]. [facility] rep states they will contact us when they start working with patient so we can DC.  Nedra HaiKristen Unger PT, DPT [phone]

## 2016-07-01 NOTE — Telephone Encounter (Signed)
Called MD office concerning order for wound vac.  Spoke to [place] about referral for home health with wound vac.   [address]y [place], LPTA; CBIS [phone]

## 2016-07-01 NOTE — Therapy (Signed)
[facility] [address] [place], [place], [address] Phone: [phone]   Fax:  [phone]  Wound Care Therapy  Patient Details  Name: Charles Pennington MRN: [id number] Date of Birth: 01-29-1966 Referring Provider: Jannifer Rodneyhristy [name]   Encounter Date: 07/01/2016      PT End of Session - 07/01/16 1337    Visit Number 10   Number of Visits 14   Date for PT Re-Evaluation 07/29/16   Authorization Type Medicare/Medicaid (G-codes done 8th session)   Authorization - Visit Number 10   Authorization - Number of Visits 18   PT Start Time 1303   PT Stop Time 1320   PT Time Calculation (min) 17 min   Activity Tolerance Patient tolerated treatment well   Behavior During Therapy [facility] for tasks assessed/performed      Past Medical History:  Diagnosis Date  . Anxiety   . Asthma   . Diabetes mellitus   . Diabetes mellitus without complication (HCC)   . Hypercholesteremia   . Hypertension   . Hypothyroidism   . Low back pain   . Obesity   . SOB (shortness of breath)   . Stroke [facility](HCC) age 51  . Stroke (HCC)   . Vertigo     Past Surgical History:  Procedure Laterality Date  . BACK SURGERY  2013  . COLONOSCOPY  2009   Inflammatory changes of the cecum and ascending colon most consistent with infectious etiology, NSAID, ischemia. Suspected resolving infection based on symptomatology.  Marland Kitchen. FOOT SURGERY     Dr Ulice Brilliantrake  . SHOULDER ARTHROSCOPY Right   . SPINE SURGERY    . WRIST SURGERY      There were no vitals filed for this visit.       Subjective Assessment - 07/01/16 1324    Subjective Pt entered dept stateing he removed dressings earlier today and took shower.  No reports of pain currently   Currently in Pain? No/denies                   Wound Therapy - 07/01/16 1328    Subjective Pt entered dept stateing he removed dressings earlier today and took shower.  No reports of pain currently   Patient and Family Stated Goals  wound to heal    Prior Treatments stitches, general observation from doctors/referral to wound clinic    Pain Assessment No/denies pain   Pain Type Acute pain   Pain Location Buttocks   Pain Orientation Left   Evaluation and Treatment Procedures Explained to Patient/Family Yes   Evaluation and Treatment Procedures agreed to   Wound Properties Date First Assessed: 04/28/16 Wound Type: Laceration Location: Buttocks Location Orientation: Left Wound Description (Comments): L gluteal laceration  Present on Admission: Yes   Dressing Type Silver hydrofiber  Silver hydrofiber, ABD and medipore tape   Dressing Changed Other (Comment)  Pt removed dressings prior sessin   Dressing Status Dry   Dressing Change Frequency PRN   Site / Wound Assessment Granulation tissue;Yellow   % Wound base Red or Granulating 95%   % Wound base Yellow/Fibrinous Exudate 5%   Peri-wound Assessment Intact   Undermining (cm) undermining at least 4.0   Margins Epibole (rolled edges)   Closure None   Drainage Amount Scant   Drainage Description Serosanguineous;No odor   Treatment Cleansed;Debridement (Selective)   Selective Debridement - Location wound borders   Selective Debridement - Tools Used Forceps;Scalpel   Selective Debridement - Tissue Removed eschar, slough  Wound Therapy - Clinical Statement Pt arrived with dressings removed prior session.  Pt educated on importance of hygiene and encouraged to leave dressings on until he returns to clinic for wound care.  Wound cleaned and continued packing with silverhydrofiber.  EOS continued to encourage pt to leave dressings on until next session, verbally understood.  Order received for wound vac.  Contacted MD office to assure referral for home health wiht wound vac.  Reduce frequency to 1x/week per PT.     Wound Therapy - Functional Problem List non-healing laceration    Factors Delaying/Impairing Wound Healing Diabetes Mellitus;Multiple medical  problems;Polypharmacy;Tobacco use   Hydrotherapy Plan Debridement;Patient/family education   Wound Therapy - Frequency Other (comment)  reduce to 1x/week   Wound Therapy - Current Recommendations Other (comment)  referral for wound vac and home health   Wound Plan Continue wound care 1x/week until referral received for home health with wound vac per PT.    Dressing  Silver hydrofiber, ABD pad and tape                   PT Short Term Goals - 06/24/16 1531      PT SHORT TERM GOAL #1   Title Wound area to be 100% granulated and with no slough/biofilm in order to show general improvement of condition    Time 2   Period Weeks   Status On-going     PT SHORT TERM GOAL #2   Title Wound borders to display healthy, healing tissue in order to demonstrate general improvement of condition    Time 2   Period Weeks   Status On-going     PT SHORT TERM GOAL #3   Title Wound depth to have decreased to 2cm along entire depth of wound in order to show general improvement of condition    Time 2   Period Weeks   Status On-going           PT Long Term Goals - 06/24/16 1531      PT LONG TERM GOAL #1   Title Wound to be less than 0.2cm deep along entire length of wound in order to show general improvement of condition    Time 4   Period Weeks   Status On-going     PT LONG TERM GOAL #2   Title Patient to be independent in applying appropriate wound dressing and skin care in order to facilitate full wound healing moving forwards on an independent basis    Time 4   Period Weeks   Status On-going             Patient will benefit from skilled therapeutic intervention in order to improve the following deficits and impairments:     Visit Diagnosis: Laceration of buttock with complication, left, subsequent encounter  Pain in left hip     Problem List Patient Active Problem List   Diagnosis Date Noted  . Multiple lacerations 04/04/2016  . Cocaine abuse 12/12/2015  .  Pain medication agreement broken 12/12/2015  . Insomnia 10/28/2015  . Pain medication agreement signed 07/23/2015  . Opioid dependence (HCC) 07/23/2015  . Morbid obesity (HCC) 06/26/2015  . Chronic back pain 06/23/2015  . Rupture of ulnar collateral ligament of thumb 03/01/2014  . Memory difficulties 08/15/2013  . Diabetic neuropathy associated with type 2 diabetes mellitus (HCC) 08/15/2013  . GERD (gastroesophageal reflux disease) 07/19/2013  . GAD (generalized anxiety disorder) 07/19/2013  . Depression 07/19/2013  . Ataxia, late effect of cerebrovascular  disease 07/05/2011  . Postlaminectomy syndrome, lumbar region 07/05/2011  . Hyperlipidemia 12/30/2009  . SMOKER 12/30/2009  . DEEP VEIN THROMBOSIS/PHLEBITIS 12/30/2009  . Colitis, acute 05/27/2008  . Hypothyroidism 05/23/2008  . Type 2 diabetes mellitus treated with insulin (HCC) 05/23/2008  . Essential hypertension 05/23/2008  . History of CVA (cerebrovascular accident) 05/23/2008  . Asthma 05/23/2008  . FATTY LIVER DISEASE 05/23/2008  . ABDOMINAL PAIN 05/23/2008   Becky Sax, LPTA; CBIS [phone]  Juel Burrow 07/01/2016, 1:55 PM  [facility] [address] [place], [place], [address] Phone: [phone]   Fax:  [phone]  Name: Charles Pennington MRN: [id number] Date of Birth: 12-Mar-1965

## 2016-07-01 NOTE — Telephone Encounter (Signed)
Placed recent re-assess with wound measurements in outgoing fax box, [facility] [place] [facility].  Nedra HaiKristen Jomarie Gellis PT, DPT [phone]

## 2016-07-01 NOTE — Telephone Encounter (Signed)
Wound vac rx signed and home health ordered.

## 2016-07-01 NOTE — Telephone Encounter (Signed)
Home health ordered and wound vac ordered

## 2016-07-01 NOTE — Addendum Note (Signed)
Addended by: Jannifer RodneyHAWKS, Myrikal Messmer A on: 07/01/2016 02:32 PM   Modules accepted: Orders

## 2016-07-01 NOTE — Telephone Encounter (Signed)
Called Dawn at [facility] [phone] to touch base since MD has signed order for wound vac/HHPT. She states that she just needs a copy of wound measurements and requests us to contact her when we have confirmation that HHPT has been ordered by MD.  Nedra HaiKristen Terita Hejl PT, DPT [phone]

## 2016-07-05 DIAGNOSIS — S31821A Laceration without foreign body of left buttock, initial encounter: Secondary | ICD-10-CM | POA: Diagnosis not present

## 2016-07-05 DIAGNOSIS — M25552 Pain in left hip: Secondary | ICD-10-CM | POA: Diagnosis not present

## 2016-07-06 ENCOUNTER — Ambulatory Visit (HOSPITAL_COMMUNITY): Payer: Medicaid Other

## 2016-07-06 ENCOUNTER — Other Ambulatory Visit: Payer: Self-pay | Admitting: Family

## 2016-07-06 ENCOUNTER — Telehealth (HOSPITAL_COMMUNITY): Payer: Self-pay | Admitting: Physical Therapy

## 2016-07-06 DIAGNOSIS — K219 Gastro-esophageal reflux disease without esophagitis: Secondary | ICD-10-CM

## 2016-07-06 NOTE — Telephone Encounter (Signed)
D/c today requested by patient: Pt recieved home device and they informed pt that this should heal the wound. Thank you all for the care he received while coming here.

## 2016-07-07 DIAGNOSIS — M25552 Pain in left hip: Secondary | ICD-10-CM | POA: Diagnosis not present

## 2016-07-07 DIAGNOSIS — E1121 Type 2 diabetes mellitus with diabetic nephropathy: Secondary | ICD-10-CM | POA: Diagnosis not present

## 2016-07-07 DIAGNOSIS — Z5181 Encounter for therapeutic drug level monitoring: Secondary | ICD-10-CM | POA: Diagnosis not present

## 2016-07-07 DIAGNOSIS — M792 Neuralgia and neuritis, unspecified: Secondary | ICD-10-CM | POA: Diagnosis not present

## 2016-07-07 DIAGNOSIS — M542 Cervicalgia: Secondary | ICD-10-CM | POA: Diagnosis not present

## 2016-07-07 DIAGNOSIS — S31821A Laceration without foreign body of left buttock, initial encounter: Secondary | ICD-10-CM | POA: Diagnosis not present

## 2016-07-07 DIAGNOSIS — G894 Chronic pain syndrome: Secondary | ICD-10-CM | POA: Diagnosis not present

## 2016-07-07 DIAGNOSIS — G8921 Chronic pain due to trauma: Secondary | ICD-10-CM | POA: Diagnosis not present

## 2016-07-07 DIAGNOSIS — Z79899 Other long term (current) drug therapy: Secondary | ICD-10-CM | POA: Diagnosis not present

## 2016-07-07 DIAGNOSIS — Z794 Long term (current) use of insulin: Secondary | ICD-10-CM | POA: Diagnosis not present

## 2016-07-08 ENCOUNTER — Ambulatory Visit (HOSPITAL_COMMUNITY): Payer: Medicaid Other | Admitting: Physical Therapy

## 2016-07-09 DIAGNOSIS — M25552 Pain in left hip: Secondary | ICD-10-CM | POA: Diagnosis not present

## 2016-07-09 DIAGNOSIS — S31821A Laceration without foreign body of left buttock, initial encounter: Secondary | ICD-10-CM | POA: Diagnosis not present

## 2016-07-13 ENCOUNTER — Ambulatory Visit (HOSPITAL_COMMUNITY): Payer: Medicare Other

## 2016-07-14 DIAGNOSIS — S31821A Laceration without foreign body of left buttock, initial encounter: Secondary | ICD-10-CM | POA: Diagnosis not present

## 2016-07-14 DIAGNOSIS — M25552 Pain in left hip: Secondary | ICD-10-CM | POA: Diagnosis not present

## 2016-07-15 ENCOUNTER — Ambulatory Visit (HOSPITAL_COMMUNITY): Payer: Medicaid Other | Admitting: Physical Therapy

## 2016-07-16 DIAGNOSIS — S31821A Laceration without foreign body of left buttock, initial encounter: Secondary | ICD-10-CM | POA: Diagnosis not present

## 2016-07-16 DIAGNOSIS — M25552 Pain in left hip: Secondary | ICD-10-CM | POA: Diagnosis not present

## 2016-07-20 ENCOUNTER — Other Ambulatory Visit: Payer: Self-pay | Admitting: Family

## 2016-07-20 DIAGNOSIS — S31821A Laceration without foreign body of left buttock, initial encounter: Secondary | ICD-10-CM | POA: Diagnosis not present

## 2016-07-20 DIAGNOSIS — M25552 Pain in left hip: Secondary | ICD-10-CM | POA: Diagnosis not present

## 2016-07-20 NOTE — Telephone Encounter (Signed)
Last filled 06/23/16, last seen 06/15/16. Route to pool for call in

## 2016-07-21 ENCOUNTER — Ambulatory Visit (INDEPENDENT_AMBULATORY_CARE_PROVIDER_SITE_OTHER): Payer: Medicare Other | Admitting: Family

## 2016-07-21 ENCOUNTER — Ambulatory Visit: Payer: Medicare Other | Admitting: Family

## 2016-07-21 ENCOUNTER — Encounter: Payer: Self-pay | Admitting: Family

## 2016-07-21 VITALS — BP 101/73 | HR 108 | Temp 98.4°F | Ht 68.0 in | Wt 220.0 lb

## 2016-07-21 DIAGNOSIS — R197 Diarrhea, unspecified: Secondary | ICD-10-CM

## 2016-07-21 DIAGNOSIS — E860 Dehydration: Secondary | ICD-10-CM

## 2016-07-21 NOTE — Telephone Encounter (Signed)
Refill called to [place] 

## 2016-07-21 NOTE — Progress Notes (Signed)
   Subjective:    Patient ID: Charles Pennington, male    DOB: 1965-06-18, 51 y.o.   MRN: [id number]  PT presents to the office today with diarrhea, weakness, decrease in appetite that started Friday. Pt states he has had diarrhea a "thousand times" and has been on recent antibiotics. Pt currently that a wound vac that home health changes three times a week. This was changed yesterday.  Diarrhea   This is a new problem. The current episode started in the past 7 days. The problem occurs more than 10 times per day. The problem has been waxing and waning. The stool consistency is described as watery. The patient states that diarrhea awakens him from sleep. Associated symptoms include abdominal pain, bloating, chills, a fever and weight loss. Risk factors include recent antibiotic use. He has tried increased fluids for the symptoms. The treatment provided mild relief.  Weakness  Associated symptoms include abdominal pain, chills, a fever and weakness.  Dizziness  Associated symptoms include abdominal pain, chills, a fever and weakness.      Review of Systems  Constitutional: Positive for chills, fever and weight loss.  Gastrointestinal: Positive for abdominal pain, bloating and diarrhea.  Neurological: Positive for dizziness and weakness.  All other systems reviewed and are negative.      Objective:   Physical Exam  Constitutional: He is oriented to person, place, and time. He appears well-developed and well-nourished. He has a sickly appearance. No distress.  HENT:  Head: Normocephalic.  Right Ear: External ear normal.  Left Ear: External ear normal.  Mouth/Throat: Oropharynx is clear and moist.  Eyes: Pupils are equal, round, and reactive to light. Right eye exhibits no discharge. Left eye exhibits no discharge.  Neck: Normal range of motion. Neck supple. No thyromegaly present.  Cardiovascular: Normal rate, regular rhythm, normal heart sounds and intact distal pulses.   No murmur  heard. Pulmonary/Chest: Effort normal and breath sounds normal. No respiratory distress. He has no wheezes.  Abdominal: Soft. Bowel sounds are normal. He exhibits no distension. There is no tenderness.  Musculoskeletal: Normal range of motion. He exhibits no edema or tenderness.  Neurological: He is alert and oriented to person, place, and time. He has normal reflexes. No cranial nerve deficit.  Skin: Skin is warm and dry. No rash noted. No erythema. There is pallor.  Psychiatric: He has a normal mood and affect. His behavior is normal. Judgment and thought content normal.  Vitals reviewed.   BP 101/73   Pulse (!) 108   Temp 98.4 F (36.9 C) (Oral)   Ht 5\' 8"  (1.727 m)   Wt 220 lb (99.8 kg)   SpO2 98%   BMI 33.45 kg/m       Assessment & Plan:  1. Diarrhea, unspecified type Worrisome for C Diff, pt told to bring sample back today Force fluids Hold BP meds for next two days Good hand hygiene discussed If symptoms worsen pt to go to ED!!! - CBC with Differential/Platelet - Cdiff NAA+O+P+Stool Culture   2. Dehydration Force fluids Hold BP meds for next two days RTO in 1 week   Jannifer Rodneyhristy Seraj Dunnam, FNP

## 2016-07-21 NOTE — Patient Instructions (Signed)
Clostridium Difficile Infection  Clostridium difficile (C. difficile or C. diff) infection is a condition that causes inflammation of the large intestine (colon). This condition can result in damage to the lining of your colon and may lead to colitis. This infection can be passed from person to person (is contagious).  What are the causes?  C. diff is a bacterium that is normally found in the colon. This infection is caused when the balance of C. diff is changed and there is an overgrowth of C. diff. This is often caused by antibiotic use.  What increases the risk?  This condition is more likely to develop in people who:  · Take antibiotic medicines.  · Take a certain type of medicine called proton pump inhibitors over a long period of time (chronic use).  · Are older.  · Have had a C. diff infection before.  · Have serious underlying conditions, such as colon cancer.  · Are in the hospital.  · Have a weak defense (immune) system.  · Live in a place where there is a lot of contact with others, such as a nursing home.  · Have had gastrointestinal (GI) tract surgery.    What are the signs or symptoms?  Symptoms of this condition include:  · Diarrhea. This may be bloody, watery, or yellow or green in color.  · Fever.  · Fatigue.  · Loss of appetite.  · Nausea.  · Swelling, pain, or tenderness in the abdomen.  · Dehydration. Dehydration can cause you to be tired and thirsty, have a dry mouth, and urinate less frequently.    How is this diagnosed?  This condition is diagnosed with a medical history and physical exam. You may also have tests, including:  · A test that checks for C. diff in your stool.  · Blood tests.  · A sigmoidoscopy or colonoscopy to look at your colon. These procedures involve passing an instrument through your rectum to look at the inside of your colon.    How is this treated?  Treatment for this condition includes:  · Antibiotics that keep C. diff from growing.  · Stopping the antibiotics you were  on before the C. diff infection began. Only do this as told by your health care provider.  · Fluids through an IV tube, if you are dehydrated.  · Surgery to remove the infected part of the colon. This is rare.    Follow these instructions at home:  Eating and drinking  · Drink enough fluid to keep your urine clear or pale yellow. Avoid milk, caffeine, and alcohol.  · Follow specific rehydration instructions as told by your health care provider.  · Eat small, frequent meals instead of large meals.  Medicines  · Take your antibiotic medicine as told by your health care provider. Do not stop taking the antibiotic even if you start to feel better unless your health care provider told you to do that.  · Take over-the-counter and prescription medicines only as told by your health care provider.  · Do not use medicines to help with diarrhea.  General instructions  · Wash your hands thoroughly before you prepare food and after you use the bathroom. Make sure people who live with you also wash their hands often.  · Clean surfaces that you touch with a product that contains chlorine bleach.  · Keep all follow-up visits as told by your health care provider. This is important.  Contact a health care provider if:  ·   Your symptoms do not get better with treatment.  · Your symptoms get worse with treatment.  · Your symptoms go away and then return.  · You have a fever.  · You have new symptoms.  Get help right away if:  · You have increasing pain or tenderness in your abdomen.  · You have stool that is mostly bloody, or your stool looks dark black and tarry.  · You cannot eat or drink without vomiting.  · You have signs of dehydration, such as:  ? Dark urine, very little urine, or no urine.  ? Cracked lips.  ? Not making tears when you cry.  ? Dry mouth.  ? Sunken eyes.  ? Sleepiness.  ? Weakness.  ? Dizziness.  This information is not intended to replace advice given to you by your health care provider. Make sure you discuss any  questions you have with your health care provider.  Document Released: 11/11/2004 Document Revised: 07/10/2015 Document Reviewed: 08/05/2014  Elsevier Interactive Patient Education © 2017 [employer]

## 2016-07-22 ENCOUNTER — Telehealth: Payer: Self-pay | Admitting: Family

## 2016-07-22 LAB — CBC WITH DIFFERENTIAL/PLATELET
BASOS ABS: 0 10*3/uL (ref 0.0–0.2)
Basos: 0 %
EOS (ABSOLUTE): 0.1 10*3/uL (ref 0.0–0.4)
Eos: 1 %
Hematocrit: 44.7 % (ref 37.5–51.0)
Hemoglobin: 15.3 g/dL (ref 13.0–17.7)
IMMATURE GRANS (ABS): 0 10*3/uL (ref 0.0–0.1)
Immature Granulocytes: 0 %
LYMPHS ABS: 1.8 10*3/uL (ref 0.7–3.1)
LYMPHS: 13 %
MCH: 31.5 pg (ref 26.6–33.0)
MCHC: 34.2 g/dL (ref 31.5–35.7)
MCV: 92 fL (ref 79–97)
Monocytes Absolute: 1 10*3/uL — ABNORMAL HIGH (ref 0.1–0.9)
Monocytes: 7 %
NEUTROS ABS: 10.8 10*3/uL — AB (ref 1.4–7.0)
Neutrophils: 79 %
PLATELETS: 300 10*3/uL (ref 150–379)
RBC: 4.86 x10E6/uL (ref 4.14–5.80)
RDW: 13.9 % (ref 12.3–15.4)
WBC: 13.8 10*3/uL — ABNORMAL HIGH (ref 3.4–10.8)

## 2016-07-22 MED ORDER — DOXYCYCLINE HYCLATE 100 MG PO TABS: 100.0000 mg | ORAL_TABLET | Freq: Two times a day (BID) | ORAL | 0 refills | Status: DC

## 2016-07-22 NOTE — Telephone Encounter (Signed)
Spoke to pt, he continues to have productive cough. His diarrhea has resolved and feeling better than last visit. PT states he is eating and drinking.
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
## 2016-07-23 DIAGNOSIS — E871 Hypo-osmolality and hyponatremia: Secondary | ICD-10-CM | POA: Diagnosis not present

## 2016-07-23 DIAGNOSIS — F329 Major depressive disorder, single episode, unspecified: Secondary | ICD-10-CM | POA: Diagnosis not present

## 2016-07-23 DIAGNOSIS — S31821A Laceration without foreign body of left buttock, initial encounter: Secondary | ICD-10-CM | POA: Diagnosis not present

## 2016-07-23 DIAGNOSIS — E860 Dehydration: Secondary | ICD-10-CM | POA: Diagnosis present

## 2016-07-23 DIAGNOSIS — E1165 Type 2 diabetes mellitus with hyperglycemia: Secondary | ICD-10-CM | POA: Diagnosis present

## 2016-07-23 DIAGNOSIS — M25552 Pain in left hip: Secondary | ICD-10-CM | POA: Diagnosis not present

## 2016-07-23 DIAGNOSIS — R0781 Pleurodynia: Secondary | ICD-10-CM | POA: Diagnosis not present

## 2016-07-23 DIAGNOSIS — J45909 Unspecified asthma, uncomplicated: Secondary | ICD-10-CM | POA: Diagnosis present

## 2016-07-23 DIAGNOSIS — Z981 Arthrodesis status: Secondary | ICD-10-CM | POA: Diagnosis not present

## 2016-07-23 DIAGNOSIS — Y999 Unspecified external cause status: Secondary | ICD-10-CM | POA: Diagnosis not present

## 2016-07-23 DIAGNOSIS — K869 Disease of pancreas, unspecified: Secondary | ICD-10-CM | POA: Diagnosis not present

## 2016-07-23 DIAGNOSIS — F411 Generalized anxiety disorder: Secondary | ICD-10-CM | POA: Diagnosis present

## 2016-07-23 DIAGNOSIS — F17200 Nicotine dependence, unspecified, uncomplicated: Secondary | ICD-10-CM | POA: Diagnosis present

## 2016-07-23 DIAGNOSIS — Z947 Corneal transplant status: Secondary | ICD-10-CM | POA: Diagnosis not present

## 2016-07-23 DIAGNOSIS — W19XXXA Unspecified fall, initial encounter: Secondary | ICD-10-CM | POA: Diagnosis not present

## 2016-07-23 DIAGNOSIS — Z885 Allergy status to narcotic agent status: Secondary | ICD-10-CM | POA: Diagnosis not present

## 2016-07-23 DIAGNOSIS — R748 Abnormal levels of other serum enzymes: Secondary | ICD-10-CM | POA: Diagnosis present

## 2016-07-23 DIAGNOSIS — E785 Hyperlipidemia, unspecified: Secondary | ICD-10-CM | POA: Diagnosis present

## 2016-07-23 DIAGNOSIS — K269 Duodenal ulcer, unspecified as acute or chronic, without hemorrhage or perforation: Secondary | ICD-10-CM | POA: Insufficient documentation

## 2016-07-23 DIAGNOSIS — I10 Essential (primary) hypertension: Secondary | ICD-10-CM | POA: Diagnosis not present

## 2016-07-23 DIAGNOSIS — R197 Diarrhea, unspecified: Secondary | ICD-10-CM | POA: Diagnosis not present

## 2016-07-23 DIAGNOSIS — K760 Fatty (change of) liver, not elsewhere classified: Secondary | ICD-10-CM | POA: Diagnosis present

## 2016-07-23 DIAGNOSIS — R1013 Epigastric pain: Secondary | ICD-10-CM | POA: Diagnosis not present

## 2016-07-23 DIAGNOSIS — R740 Nonspecific elevation of levels of transaminase and lactic acid dehydrogenase [LDH]: Secondary | ICD-10-CM | POA: Diagnosis present

## 2016-07-23 DIAGNOSIS — E1140 Type 2 diabetes mellitus with diabetic neuropathy, unspecified: Secondary | ICD-10-CM | POA: Diagnosis not present

## 2016-07-23 DIAGNOSIS — K219 Gastro-esophageal reflux disease without esophagitis: Secondary | ICD-10-CM | POA: Diagnosis present

## 2016-07-23 DIAGNOSIS — Z8673 Personal history of transient ischemic attack (TIA), and cerebral infarction without residual deficits: Secondary | ICD-10-CM | POA: Diagnosis not present

## 2016-07-23 DIAGNOSIS — Z7902 Long term (current) use of antithrombotics/antiplatelets: Secondary | ICD-10-CM | POA: Diagnosis not present

## 2016-07-23 DIAGNOSIS — E878 Other disorders of electrolyte and fluid balance, not elsewhere classified: Secondary | ICD-10-CM | POA: Diagnosis present

## 2016-07-23 DIAGNOSIS — Z794 Long term (current) use of insulin: Secondary | ICD-10-CM | POA: Diagnosis not present

## 2016-07-23 DIAGNOSIS — R000 Tachycardia, unspecified: Secondary | ICD-10-CM | POA: Diagnosis not present

## 2016-07-23 DIAGNOSIS — E039 Hypothyroidism, unspecified: Secondary | ICD-10-CM | POA: Diagnosis present

## 2016-07-26 ENCOUNTER — Other Ambulatory Visit: Payer: Self-pay | Admitting: Family

## 2016-07-26 DIAGNOSIS — S31821A Laceration without foreign body of left buttock, initial encounter: Secondary | ICD-10-CM | POA: Diagnosis not present

## 2016-07-26 DIAGNOSIS — M25552 Pain in left hip: Secondary | ICD-10-CM | POA: Diagnosis not present

## 2016-07-27 ENCOUNTER — Other Ambulatory Visit: Payer: Self-pay | Admitting: Family

## 2016-07-28 DIAGNOSIS — S31821A Laceration without foreign body of left buttock, initial encounter: Secondary | ICD-10-CM | POA: Diagnosis not present

## 2016-07-28 DIAGNOSIS — M25552 Pain in left hip: Secondary | ICD-10-CM | POA: Diagnosis not present

## 2016-07-29 ENCOUNTER — Ambulatory Visit (INDEPENDENT_AMBULATORY_CARE_PROVIDER_SITE_OTHER): Payer: Medicare Other | Admitting: Family

## 2016-07-29 ENCOUNTER — Encounter: Payer: Self-pay | Admitting: Family

## 2016-07-29 VITALS — BP 119/80 | HR 96 | Temp 99.1°F | Ht 68.0 in | Wt 227.4 lb

## 2016-07-29 DIAGNOSIS — F17200 Nicotine dependence, unspecified, uncomplicated: Secondary | ICD-10-CM

## 2016-07-29 DIAGNOSIS — Z794 Long term (current) use of insulin: Secondary | ICD-10-CM

## 2016-07-29 DIAGNOSIS — E119 Type 2 diabetes mellitus without complications: Secondary | ICD-10-CM

## 2016-07-29 DIAGNOSIS — E1142 Type 2 diabetes mellitus with diabetic polyneuropathy: Secondary | ICD-10-CM | POA: Diagnosis not present

## 2016-07-29 DIAGNOSIS — Z09 Encounter for follow-up examination after completed treatment for conditions other than malignant neoplasm: Secondary | ICD-10-CM | POA: Diagnosis not present

## 2016-07-29 DIAGNOSIS — S31829S Unspecified open wound of left buttock, sequela: Secondary | ICD-10-CM | POA: Diagnosis not present

## 2016-07-29 DIAGNOSIS — G4700 Insomnia, unspecified: Secondary | ICD-10-CM

## 2016-07-29 LAB — BAYER DCA HB A1C WAIVED: HB A1C (BAYER DCA - WAIVED): 7.4 % — ABNORMAL HIGH (ref <7.0)

## 2016-07-29 MED ORDER — ZOLPIDEM TARTRATE 10 MG PO TABS: 10.0000 mg | ORAL_TABLET | Freq: Every evening | ORAL | 1 refills | Status: DC | PRN

## 2016-07-29 NOTE — Patient Instructions (Signed)

## 2016-07-29 NOTE — Progress Notes (Signed)
   Subjective:    Patient ID: Charles Pennington, male    DOB: [birth year], 51 y.o.   MRN: [id number]  HPI Pt presents to the office today for hospital follow up. PT went to the ED on 07/23/16 for abdominal pain and diarrhea and was diagnosed with duodenal ulcer.   Pt has wound vac on on his left gluteal area that home health comes three times a week.   Pt requesting diabetic shoes. States he had callus on right ball of his foot and "cut it out with a knife". Pt states the area is better, but a little red. States his glucose are "running in low 100's".  States he has constant burning and tingling pain of 8 out 10.   Requesting we change his belsomra back to Ambien. States he is unable to fall asleep with the Belsomra.   Review of Systems  Respiratory: Positive for cough.   Skin: Positive for wound.  All other systems reviewed and are negative.      Objective:   Physical Exam  Constitutional: He is oriented to person, place, and time. He appears well-developed and well-nourished. No distress.  HENT:  Head: Normocephalic.  Eyes: Pupils are equal, round, and reactive to light. Right eye exhibits no discharge. Left eye exhibits no discharge.  Neck: Normal range of motion. Neck supple. No thyromegaly present.  Cardiovascular: Normal rate, regular rhythm, normal heart sounds and intact distal pulses.   No murmur heard. Pulmonary/Chest: Effort normal. No respiratory distress. He has decreased breath sounds. He has wheezes.  Abdominal: Soft. Bowel sounds are normal. He exhibits no distension. There is no tenderness.  Musculoskeletal: Normal range of motion. He exhibits no edema or tenderness.  Neurological: He is alert and oriented to person, place, and time.  Skin: Skin is warm and dry. No rash noted. No erythema.  Wound vac present on left buttocks  Psychiatric: He has a normal mood and affect. His behavior is normal. Judgment and thought content normal.  Vitals reviewed.   BP 119/80    Pulse 96   Temp 99.1 F (37.3 C) (Oral)   Ht _0  (1.727 m)   Wt 227 lb 6.4 oz (103.1 kg)   BMI 34.58 kg/m        Assessment & Plan:  1. Type 2 diabetes mellitus treated with insulin (HCC) - Microalbumin / creatinine urine ratio - CMP14+EGFR - Bayer DCA Hb A1c Waived - CBC with Differential/Platelet  2. Diabetic polyneuropathy associated with type 2 diabetes mellitus (HCC) - Microalbumin / creatinine urine ratio - CMP14+EGFR - Bayer DCA Hb A1c Waived - CBC with Differential/Platelet  3. SMOKER - CMP14+EGFR - CBC with Differential/Platelet  4. Wound of left buttock, sequela - CMP14+EGFR - CBC with Differential/Platelet  5. Hospital discharge follow-up - CMP14+EGFR - CBC with Differential/Platelet  6. Insomnia, unspecified type Belsomra stopped and Ambien reordered - CMP14+EGFR - CBC with Differential/Platelet - zolpidem (AMBIEN) 10 MG tablet; Take 1 tablet (10 mg total) by mouth at bedtime as needed for sleep.  Dispense: 15 tablet; Refill: 1   RX for Diabetic shoes given today Discussed importance not to "cut" or use knife on his feet ever!!!! Keep appts with Home Health for dressing changes of wound vac RTO prn   Evelina Dun, FNP

## 2016-07-30 DIAGNOSIS — M25552 Pain in left hip: Secondary | ICD-10-CM | POA: Diagnosis not present

## 2016-07-30 DIAGNOSIS — S31821A Laceration without foreign body of left buttock, initial encounter: Secondary | ICD-10-CM | POA: Diagnosis not present

## 2016-07-30 LAB — CMP14+EGFR
ALT: 24 IU/L (ref 0–44)
AST: 21 IU/L (ref 0–40)
Albumin/Globulin Ratio: 1.7 (ref 1.2–2.2)
Albumin: 3.7 g/dL (ref 3.5–5.5)
Alkaline Phosphatase: 50 IU/L (ref 39–117)
BUN/Creatinine Ratio: 12 (ref 9–20)
BUN: 13 mg/dL (ref 6–24)
Bilirubin Total: <0.2 mg/dL (ref 0.0–1.2)
CO2: 24 mmol/L (ref 20–29)
CREATININE: 1.12 mg/dL (ref 0.76–1.27)
Calcium: 9 mg/dL (ref 8.7–10.2)
Chloride: 98 mmol/L (ref 96–106)
GFR calc Af Amer: 88 mL/min/{1.73_m2} (ref >59)
GFR, EST NON AFRICAN AMERICAN: 76 mL/min/{1.73_m2} (ref >59)
Globulin, Total: 2.2 g/dL (ref 1.5–4.5)
Glucose: 249 mg/dL — ABNORMAL HIGH (ref 65–99)
Potassium: 4.7 mmol/L (ref 3.5–5.2)
Sodium: 137 mmol/L (ref 134–144)
Total Protein: 5.9 g/dL — ABNORMAL LOW (ref 6.0–8.5)

## 2016-07-30 LAB — CBC WITH DIFFERENTIAL/PLATELET
BASOS: 1 %
Basophils Absolute: 0.1 10*3/uL (ref 0.0–0.2)
EOS (ABSOLUTE): 0.4 10*3/uL (ref 0.0–0.4)
EOS: 4 %
HEMATOCRIT: 40.9 % (ref 37.5–51.0)
Hemoglobin: 13.3 g/dL (ref 13.0–17.7)
IMMATURE GRANS (ABS): 0 10*3/uL (ref 0.0–0.1)
IMMATURE GRANULOCYTES: 0 %
LYMPHS: 41 %
Lymphocytes Absolute: 4.1 10*3/uL — ABNORMAL HIGH (ref 0.7–3.1)
MCH: 30.6 pg (ref 26.6–33.0)
MCHC: 32.5 g/dL (ref 31.5–35.7)
MCV: 94 fL (ref 79–97)
MONOCYTES: 5 %
Monocytes Absolute: 0.5 10*3/uL (ref 0.1–0.9)
NEUTROS PCT: 49 %
Neutrophils Absolute: 5.1 10*3/uL (ref 1.4–7.0)
PLATELETS: 393 10*3/uL — AB (ref 150–379)
RBC: 4.34 x10E6/uL (ref 4.14–5.80)
RDW: 13.5 % (ref 12.3–15.4)
WBC: 10.2 10*3/uL (ref 3.4–10.8)

## 2016-07-30 LAB — MICROALBUMIN / CREATININE URINE RATIO
CREATININE, UR: 70.3 mg/dL
Microalb/Creat Ratio: <4.3 mg/g creat (ref 0.0–30.0)

## 2016-08-02 ENCOUNTER — Other Ambulatory Visit: Payer: Self-pay | Admitting: Family

## 2016-08-02 ENCOUNTER — Telehealth: Payer: Self-pay | Admitting: Family

## 2016-08-02 DIAGNOSIS — M25552 Pain in left hip: Secondary | ICD-10-CM | POA: Diagnosis not present

## 2016-08-02 DIAGNOSIS — S31821A Laceration without foreign body of left buttock, initial encounter: Secondary | ICD-10-CM | POA: Diagnosis not present

## 2016-08-02 DIAGNOSIS — R911 Solitary pulmonary nodule: Secondary | ICD-10-CM

## 2016-08-02 NOTE — Telephone Encounter (Signed)
Needing CT or MRI of lungs forgot to mention at visit last week [facility]

## 2016-08-03 NOTE — Telephone Encounter (Signed)
Ct chest ordered to reevaluate pulmonary nodules

## 2016-08-03 NOTE — Telephone Encounter (Signed)
Last filled 05/07/06 for 90 days. Last seen 07/29/16. Route to pool for call in

## 2016-08-03 NOTE — Telephone Encounter (Signed)
Refill called to [place] 

## 2016-08-04 DIAGNOSIS — G894 Chronic pain syndrome: Secondary | ICD-10-CM | POA: Diagnosis not present

## 2016-08-04 DIAGNOSIS — M792 Neuralgia and neuritis, unspecified: Secondary | ICD-10-CM | POA: Diagnosis not present

## 2016-08-04 DIAGNOSIS — E1121 Type 2 diabetes mellitus with diabetic nephropathy: Secondary | ICD-10-CM | POA: Diagnosis not present

## 2016-08-04 DIAGNOSIS — F17200 Nicotine dependence, unspecified, uncomplicated: Secondary | ICD-10-CM | POA: Diagnosis not present

## 2016-08-04 DIAGNOSIS — E0842 Diabetes mellitus due to underlying condition with diabetic polyneuropathy: Secondary | ICD-10-CM | POA: Diagnosis not present

## 2016-08-04 DIAGNOSIS — S31821A Laceration without foreign body of left buttock, initial encounter: Secondary | ICD-10-CM | POA: Diagnosis not present

## 2016-08-04 DIAGNOSIS — M25552 Pain in left hip: Secondary | ICD-10-CM | POA: Diagnosis not present

## 2016-08-04 DIAGNOSIS — Z5181 Encounter for therapeutic drug level monitoring: Secondary | ICD-10-CM | POA: Diagnosis not present

## 2016-08-04 DIAGNOSIS — Z79899 Other long term (current) drug therapy: Secondary | ICD-10-CM | POA: Diagnosis not present

## 2016-08-06 DIAGNOSIS — M25552 Pain in left hip: Secondary | ICD-10-CM | POA: Diagnosis not present

## 2016-08-06 DIAGNOSIS — S31821A Laceration without foreign body of left buttock, initial encounter: Secondary | ICD-10-CM | POA: Diagnosis not present

## 2016-08-09 DIAGNOSIS — M25552 Pain in left hip: Secondary | ICD-10-CM | POA: Diagnosis not present

## 2016-08-09 DIAGNOSIS — S31821A Laceration without foreign body of left buttock, initial encounter: Secondary | ICD-10-CM | POA: Diagnosis not present

## 2016-08-10 ENCOUNTER — Other Ambulatory Visit: Payer: Self-pay | Admitting: Family

## 2016-08-10 DIAGNOSIS — G4700 Insomnia, unspecified: Secondary | ICD-10-CM

## 2016-08-11 ENCOUNTER — Ambulatory Visit (HOSPITAL_COMMUNITY): Admission: RE | Admit: 2016-08-11 | Payer: Medicare Other | Source: Ambulatory Visit

## 2016-08-11 DIAGNOSIS — S31821A Laceration without foreign body of left buttock, initial encounter: Secondary | ICD-10-CM | POA: Diagnosis not present

## 2016-08-11 DIAGNOSIS — M25552 Pain in left hip: Secondary | ICD-10-CM | POA: Diagnosis not present

## 2016-08-16 DIAGNOSIS — M25552 Pain in left hip: Secondary | ICD-10-CM | POA: Diagnosis not present

## 2016-08-16 DIAGNOSIS — S31821A Laceration without foreign body of left buttock, initial encounter: Secondary | ICD-10-CM | POA: Diagnosis not present

## 2016-08-20 DIAGNOSIS — S31821A Laceration without foreign body of left buttock, initial encounter: Secondary | ICD-10-CM | POA: Diagnosis not present

## 2016-08-20 DIAGNOSIS — M25552 Pain in left hip: Secondary | ICD-10-CM | POA: Diagnosis not present

## 2016-08-23 ENCOUNTER — Telehealth: Payer: Self-pay

## 2016-08-23 DIAGNOSIS — S31821A Laceration without foreign body of left buttock, initial encounter: Secondary | ICD-10-CM | POA: Diagnosis not present

## 2016-08-23 DIAGNOSIS — M25552 Pain in left hip: Secondary | ICD-10-CM | POA: Diagnosis not present

## 2016-08-23 NOTE — Telephone Encounter (Signed)
Having a CT on chest wondering if I can have a full body scan because I still have a knot on my head and my hand bothers me

## 2016-08-23 NOTE — Telephone Encounter (Signed)
We will hold off on head scan at this time. Keep CT chest appt

## 2016-08-24 ENCOUNTER — Ambulatory Visit (HOSPITAL_COMMUNITY)
Admission: RE | Admit: 2016-08-24 | Discharge: 2016-08-24 | Disposition: A | Discharge status: Home / Self Care | Payer: Medicare Other | Source: Ambulatory Visit | Attending: Family | Admitting: Family

## 2016-08-24 DIAGNOSIS — R911 Solitary pulmonary nodule: Secondary | ICD-10-CM

## 2016-08-24 DIAGNOSIS — R918 Other nonspecific abnormal finding of lung field: Secondary | ICD-10-CM | POA: Diagnosis not present

## 2016-08-24 DIAGNOSIS — I700 Atherosclerosis of aorta: Secondary | ICD-10-CM | POA: Diagnosis not present

## 2016-08-25 DIAGNOSIS — M25552 Pain in left hip: Secondary | ICD-10-CM | POA: Diagnosis not present

## 2016-08-25 DIAGNOSIS — S31821A Laceration without foreign body of left buttock, initial encounter: Secondary | ICD-10-CM | POA: Diagnosis not present

## 2016-08-26 ENCOUNTER — Ambulatory Visit: Payer: Medicare Other | Admitting: Family

## 2016-08-30 ENCOUNTER — Encounter: Payer: Self-pay | Admitting: Family

## 2016-08-30 DIAGNOSIS — M25552 Pain in left hip: Secondary | ICD-10-CM | POA: Diagnosis not present

## 2016-08-30 DIAGNOSIS — S31821A Laceration without foreign body of left buttock, initial encounter: Secondary | ICD-10-CM | POA: Diagnosis not present

## 2016-08-31 ENCOUNTER — Ambulatory Visit: Payer: Medicare Other | Admitting: Family

## 2016-08-31 DIAGNOSIS — Z794 Long term (current) use of insulin: Secondary | ICD-10-CM | POA: Diagnosis not present

## 2016-08-31 DIAGNOSIS — M792 Neuralgia and neuritis, unspecified: Secondary | ICD-10-CM | POA: Diagnosis not present

## 2016-08-31 DIAGNOSIS — E1121 Type 2 diabetes mellitus with diabetic nephropathy: Secondary | ICD-10-CM | POA: Diagnosis not present

## 2016-08-31 DIAGNOSIS — E0842 Diabetes mellitus due to underlying condition with diabetic polyneuropathy: Secondary | ICD-10-CM | POA: Diagnosis not present

## 2016-08-31 DIAGNOSIS — G8921 Chronic pain due to trauma: Secondary | ICD-10-CM | POA: Diagnosis not present

## 2016-09-01 DIAGNOSIS — M25552 Pain in left hip: Secondary | ICD-10-CM | POA: Diagnosis not present

## 2016-09-01 DIAGNOSIS — S31821A Laceration without foreign body of left buttock, initial encounter: Secondary | ICD-10-CM | POA: Diagnosis not present

## 2016-09-02 ENCOUNTER — Encounter: Payer: Self-pay | Admitting: Family

## 2016-09-02 ENCOUNTER — Ambulatory Visit (INDEPENDENT_AMBULATORY_CARE_PROVIDER_SITE_OTHER): Payer: Medicare Other | Admitting: Family

## 2016-09-02 VITALS — BP 131/85 | HR 105 | Temp 98.6°F | Ht 68.0 in | Wt 232.6 lb

## 2016-09-02 DIAGNOSIS — M5440 Lumbago with sciatica, unspecified side: Secondary | ICD-10-CM | POA: Diagnosis not present

## 2016-09-02 DIAGNOSIS — E11621 Type 2 diabetes mellitus with foot ulcer: Secondary | ICD-10-CM

## 2016-09-02 DIAGNOSIS — L97519 Non-pressure chronic ulcer of other part of right foot with unspecified severity: Secondary | ICD-10-CM | POA: Diagnosis not present

## 2016-09-02 DIAGNOSIS — S31829S Unspecified open wound of left buttock, sequela: Secondary | ICD-10-CM | POA: Diagnosis not present

## 2016-09-02 DIAGNOSIS — Z794 Long term (current) use of insulin: Secondary | ICD-10-CM

## 2016-09-02 DIAGNOSIS — E1142 Type 2 diabetes mellitus with diabetic polyneuropathy: Secondary | ICD-10-CM | POA: Diagnosis not present

## 2016-09-02 DIAGNOSIS — E119 Type 2 diabetes mellitus without complications: Secondary | ICD-10-CM | POA: Diagnosis not present

## 2016-09-02 DIAGNOSIS — G8929 Other chronic pain: Secondary | ICD-10-CM

## 2016-09-02 MED ORDER — BLOOD GLUCOSE METER KIT: PACK | 0 refills | Status: DC

## 2016-09-02 MED ORDER — KETOROLAC TROMETHAMINE 60 MG/2ML IM SOLN
60.0000 mg | Freq: Once | INTRAMUSCULAR | Status: AC
  Administered 2016-09-02: 60 mg via INTRAMUSCULAR

## 2016-09-02 MED ORDER — TETRAHYDROZ-DEXTRAN-PEG-POVID 0.05-0.1-1-1 % OP SOLN: 2.0000 [drp] | Freq: Two times a day (BID) | OPHTHALMIC | 5 refills | Status: DC

## 2016-09-02 NOTE — Patient Instructions (Signed)

## 2016-09-02 NOTE — Progress Notes (Signed)
   Subjective:    Patient ID: Charles Pennington, male    DOB: 03-Aug-1965, 51 y.o.   MRN: [id number]   HPI PT presents to the office today with a questions about diabetic shoes and medications refills. PT states his wound vac was removed on 07/11/418 and still has home health come for dressing changes. PT states his wound is doing great!  PT states he [facility] is [facility] for Diabetic shoes  [facility] and was told he would have to pay 20%. PT also states he has a wound on his right great toe from "when I cut the skin off with my knife about a month ago". States the area still has not completely healed.   Pt complaining of numbness and tingling pain in right hand and feet. PT has neuropathy and currently taking Lyrica.   Pt is followed by Pain Clinic every month for chronic back pain and neuropathy. States he will get a TED's unit placed soon.    Review of Systems  Respiratory: Positive for cough.   Musculoskeletal: Positive for arthralgias and back pain.  Skin: Positive for wound.  All other systems reviewed and are negative.      Objective:   Physical Exam  Constitutional: He is oriented to person, place, and time. He appears well-developed and well-nourished. No distress.  HENT:  Head: Normocephalic.  Right Ear: External ear normal.  Left Ear: External ear normal.  Nose: Nose normal.  Mouth/Throat: Oropharynx is clear and moist.  Eyes: Pupils are equal, round, and reactive to light. Right eye exhibits no discharge. Left eye exhibits no discharge.  Neck: Normal range of motion. Neck supple. No thyromegaly present.  Cardiovascular: Normal rate, regular rhythm, normal heart sounds and intact distal pulses.   No murmur heard. Pulmonary/Chest: Effort normal and breath sounds normal. No respiratory distress. He has no wheezes.  Abdominal: Soft. Bowel sounds are normal. He exhibits no distension. There is no tenderness.  Musculoskeletal: Normal range of motion. He exhibits no edema or  tenderness.  Neurological: He is alert and oriented to person, place, and time.  Skin: Skin is warm and dry. There is erythema.  Laceration on left gluteal with mild erythemas borders, no discharge, looks much better!  opened area on Right medial great toe nail   Psychiatric: He has a normal mood and affect. His behavior is normal. Judgment and thought content normal.  Vitals reviewed.     BP 131/85   Pulse (!) 105   Temp 98.6 F (37 C) (Oral)   Ht 5\' 8"  (1.727 m)   Wt 232 lb 9.6 oz (105.5 kg)   BMI 35.37 kg/m      Assessment & Plan:  1. Type 2 diabetes mellitus treated with insulin (HCC) - Ambulatory referral to Podiatry  2. Diabetic polyneuropathy associated with type 2 diabetes mellitus (HCC) - Ambulatory referral to Podiatry  3. Chronic low back pain with sciatica, sciatica laterality unspecified, unspecified back pain laterality - ketorolac (TORADOL) injection 60 mg; Inject 2 mLs (60 mg total) into the muscle once.  4. Diabetic ulcer of toe of right foot associated with type 2 diabetes mellitus, unspecified ulcer stage (HCC) - Ambulatory referral to Podiatry  5. Wound of gluteal cleft, left, sequela  Follow up with Podiatry Continue Lyrica Keep appt with Pain Clinic Take Rx for diabetic shoe to another pharmacy Continue all medications and keep home health apts!! RTO in 3 months  Jannifer Rodneyhristy Kinslee Dalpe, FNP

## 2016-09-02 NOTE — Addendum Note (Signed)
Addended by: Almeta MonasSTONE, Jossiah Smoak M on: 09/02/2016 12:10 PM   Modules accepted: Orders

## 2016-09-03 DIAGNOSIS — M25552 Pain in left hip: Secondary | ICD-10-CM | POA: Diagnosis not present

## 2016-09-03 DIAGNOSIS — S31821D Laceration without foreign body of left buttock, subsequent encounter: Secondary | ICD-10-CM | POA: Diagnosis not present

## 2016-09-06 DIAGNOSIS — M25552 Pain in left hip: Secondary | ICD-10-CM | POA: Diagnosis not present

## 2016-09-06 DIAGNOSIS — S31821D Laceration without foreign body of left buttock, subsequent encounter: Secondary | ICD-10-CM | POA: Diagnosis not present

## 2016-09-08 DIAGNOSIS — M25552 Pain in left hip: Secondary | ICD-10-CM | POA: Diagnosis not present

## 2016-09-08 DIAGNOSIS — S31821D Laceration without foreign body of left buttock, subsequent encounter: Secondary | ICD-10-CM | POA: Diagnosis not present

## 2016-09-13 DIAGNOSIS — S31821D Laceration without foreign body of left buttock, subsequent encounter: Secondary | ICD-10-CM | POA: Diagnosis not present

## 2016-09-13 DIAGNOSIS — M25552 Pain in left hip: Secondary | ICD-10-CM | POA: Diagnosis not present

## 2016-09-14 ENCOUNTER — Ambulatory Visit (INDEPENDENT_AMBULATORY_CARE_PROVIDER_SITE_OTHER): Payer: Medicare Other | Admitting: Family Medicine

## 2016-09-14 DIAGNOSIS — K529 Noninfective gastroenteritis and colitis, unspecified: Secondary | ICD-10-CM

## 2016-09-14 DIAGNOSIS — M25552 Pain in left hip: Secondary | ICD-10-CM

## 2016-09-14 DIAGNOSIS — F17200 Nicotine dependence, unspecified, uncomplicated: Secondary | ICD-10-CM

## 2016-09-14 DIAGNOSIS — M545 Low back pain: Secondary | ICD-10-CM | POA: Diagnosis not present

## 2016-09-14 DIAGNOSIS — F419 Anxiety disorder, unspecified: Secondary | ICD-10-CM | POA: Diagnosis not present

## 2016-09-14 DIAGNOSIS — E039 Hypothyroidism, unspecified: Secondary | ICD-10-CM | POA: Diagnosis not present

## 2016-09-14 DIAGNOSIS — E1140 Type 2 diabetes mellitus with diabetic neuropathy, unspecified: Secondary | ICD-10-CM | POA: Diagnosis not present

## 2016-09-14 DIAGNOSIS — F1410 Cocaine abuse, uncomplicated: Secondary | ICD-10-CM | POA: Diagnosis not present

## 2016-09-14 DIAGNOSIS — E785 Hyperlipidemia, unspecified: Secondary | ICD-10-CM | POA: Diagnosis not present

## 2016-09-14 DIAGNOSIS — S31821A Laceration without foreign body of left buttock, initial encounter: Secondary | ICD-10-CM

## 2016-09-14 DIAGNOSIS — J45909 Unspecified asthma, uncomplicated: Secondary | ICD-10-CM | POA: Diagnosis not present

## 2016-09-15 DIAGNOSIS — S31821D Laceration without foreign body of left buttock, subsequent encounter: Secondary | ICD-10-CM | POA: Diagnosis not present

## 2016-09-15 DIAGNOSIS — M25552 Pain in left hip: Secondary | ICD-10-CM | POA: Diagnosis not present

## 2016-09-20 ENCOUNTER — Other Ambulatory Visit: Payer: Self-pay | Admitting: Family

## 2016-09-20 DIAGNOSIS — F411 Generalized anxiety disorder: Secondary | ICD-10-CM

## 2016-09-20 DIAGNOSIS — F32A Depression, unspecified: Secondary | ICD-10-CM

## 2016-09-20 DIAGNOSIS — F329 Major depressive disorder, single episode, unspecified: Secondary | ICD-10-CM

## 2016-09-21 DIAGNOSIS — E1121 Type 2 diabetes mellitus with diabetic nephropathy: Secondary | ICD-10-CM | POA: Diagnosis not present

## 2016-09-21 DIAGNOSIS — F17200 Nicotine dependence, unspecified, uncomplicated: Secondary | ICD-10-CM | POA: Diagnosis not present

## 2016-09-21 DIAGNOSIS — Z79899 Other long term (current) drug therapy: Secondary | ICD-10-CM | POA: Diagnosis not present

## 2016-09-21 DIAGNOSIS — Z794 Long term (current) use of insulin: Secondary | ICD-10-CM | POA: Diagnosis not present

## 2016-09-21 DIAGNOSIS — Z5181 Encounter for therapeutic drug level monitoring: Secondary | ICD-10-CM | POA: Diagnosis not present

## 2016-09-21 DIAGNOSIS — M792 Neuralgia and neuritis, unspecified: Secondary | ICD-10-CM | POA: Diagnosis not present

## 2016-09-21 DIAGNOSIS — M961 Postlaminectomy syndrome, not elsewhere classified: Secondary | ICD-10-CM | POA: Diagnosis not present

## 2016-09-22 DIAGNOSIS — M25552 Pain in left hip: Secondary | ICD-10-CM | POA: Diagnosis not present

## 2016-09-22 DIAGNOSIS — S31821D Laceration without foreign body of left buttock, subsequent encounter: Secondary | ICD-10-CM | POA: Diagnosis not present

## 2016-09-27 ENCOUNTER — Ambulatory Visit: Payer: Medicare Other | Admitting: Podiatry

## 2016-09-28 ENCOUNTER — Other Ambulatory Visit: Payer: Self-pay | Admitting: Family

## 2016-09-28 DIAGNOSIS — K219 Gastro-esophageal reflux disease without esophagitis: Secondary | ICD-10-CM

## 2016-09-30 ENCOUNTER — Encounter: Payer: Self-pay | Admitting: Family

## 2016-09-30 ENCOUNTER — Ambulatory Visit (INDEPENDENT_AMBULATORY_CARE_PROVIDER_SITE_OTHER): Payer: Medicare Other | Admitting: Family

## 2016-09-30 VITALS — BP 94/64 | HR 108 | Temp 98.1°F | Ht 68.0 in | Wt 240.0 lb

## 2016-09-30 DIAGNOSIS — Z5189 Encounter for other specified aftercare: Secondary | ICD-10-CM

## 2016-09-30 DIAGNOSIS — L719 Rosacea, unspecified: Secondary | ICD-10-CM

## 2016-09-30 DIAGNOSIS — E785 Hyperlipidemia, unspecified: Secondary | ICD-10-CM

## 2016-09-30 DIAGNOSIS — I959 Hypotension, unspecified: Secondary | ICD-10-CM | POA: Diagnosis not present

## 2016-09-30 DIAGNOSIS — R231 Pallor: Secondary | ICD-10-CM

## 2016-09-30 LAB — HEMOGLOBIN, FINGERSTICK: Hemoglobin: 14.9 g/dL (ref 12.6–17.7)

## 2016-09-30 MED ORDER — OLOPATADINE HCL 0.2 % OP SOLN: 1.0000 [drp] | Freq: Every day | OPHTHALMIC | 6 refills | Status: DC

## 2016-09-30 MED ORDER — TETRAHYDROZ-DEXTRAN-PEG-POVID 0.05-0.1-1-1 % OP SOLN: 2.0000 [drp] | Freq: Two times a day (BID) | OPHTHALMIC | 5 refills | Status: DC

## 2016-09-30 MED ORDER — ATORVASTATIN CALCIUM 80 MG PO TABS: 80.0000 mg | ORAL_TABLET | Freq: Every day | ORAL | 0 refills | Status: DC

## 2016-09-30 MED ORDER — METRONIDAZOLE 0.75 % EX GEL: 1.0000 "application " | Freq: Two times a day (BID) | CUTANEOUS | 0 refills | Status: DC

## 2016-09-30 NOTE — Patient Instructions (Addendum)
Rosacea Rosacea is a long-term (chronic) condition that affects the skin of the face, including the cheeks, nose, brow, and chin. This condition can also affect the eyes. Rosacea causes blood vessels near the surface of the skin to enlarge, which results in redness. What are the causes? The cause of this condition is not known. Certain triggers can make rosacea worse, including:  Hot baths.  Exercise.  Sunlight.  Very hot or cold temperatures.  Hot or spicy foods and drinks.  Drinking alcohol.  Stress.  Taking blood pressure medicine.  Long-term use of topical steroids on the face.  What increases the risk? This condition is more likely to develop in:  People who are older than 50 years of age.  Women.  People who have light-colored skin (light complexion).  People who have a family history of rosacea.  What are the signs or symptoms? Symptoms of this condition include:  Redness of the face.  Red bumps or pimples on the face.  A red, enlarged nose.  Blushing easily.  Red lines on the skin.  Irritated or burning feeling in the eyes.  Swollen eyelids.  How is this diagnosed? This condition is diagnosed with a medical history and physical exam. How is this treated? There is no cure for this condition, but treatment can help to control your symptoms. Your health care provider may recommend that you see a skin specialist (dermatologist). Treatment may include:  Antibiotic medicines that are applied to the skin or taken as a pill.  Laser treatment to improve the appearance of the skin.  Surgery. This is rare.  Your health care provider will also recommend the best way to take care of your skin. Even after your skin improves, you will likely need to continue treatment to prevent your rosacea from coming back. Follow these instructions at home: Skin Care Take care of your skin as told by your health care provider. You may be told to do these things:  Wash  your skin gently two or more times each day.  Use mild soap.  Use a sunscreen or sunblock with SPF 30 or greater.  Use gentle cosmetics that are meant for sensitive skin.  Shave with an electric shaver instead of a blade.  Lifestyle  Try to keep track of what foods trigger this condition. Avoid any triggers. These may include: ? Spicy foods. ? Seafood. ? Cheese. ? Hot liquids. ? Nuts. ? Chocolate. ? Iodized salt.  Do not drink alcohol.  Avoid extremely cold or hot temperatures.  Try to reduce your stress. If you need help, talk with your health care provider.  When you exercise, do these things to stay cool: ? Limit your sun exposure. ? Use a fan. ? Do shorter and more frequent intervals of exercise. General instructions  Keep all follow-up visits as told by your health care provider. This is important.  Take over-the-counter and prescription medicines only as told by your health care provider.  If your eyelids are affected, apply warm compresses to them. Do this as told by your health care provider.  If you were prescribed an antibiotic medicine, apply or take it as told by your health care provider. Do not stop using the antibiotic even if your condition improves. Contact a health care provider if:  Your symptoms get worse.  Your symptoms do not improve after two months of treatment.  You have new symptoms.  You have any changes in vision or you have problems with your eyes, such as   redness or itching.  You feel depressed.  You lose your appetite.  You have trouble concentrating. This information is not intended to replace advice given to you by your health care provider. Make sure you discuss any questions you have with your health care provider. Document Released: 03/11/2004 Document Revised: 07/10/2015 Document Reviewed: 04/10/2014 Elsevier Interactive Patient Education  2018 Elsevier Inc.  

## 2016-09-30 NOTE — Addendum Note (Signed)
Addended by: Jannifer RodneyHAWKS, Huan Pollok A on: 09/30/2016 09:54 AM   Modules accepted: Orders

## 2016-09-30 NOTE — Progress Notes (Signed)
   Subjective:    Patient ID: Charles Pennington, male    DOB: 1965/06/06, 51 y.o.   MRN: [id number]  Wound Check  He was originally treated more than 14 days ago. There has been no drainage from the wound. The redness has improved. The swelling has improved. The pain has improved (6). He has no difficulty moving the affected extremity or digit.  Rash  This is a chronic problem. The current episode started more than 1 year ago. The problem has been waxing and waning since onset. The affected locations include the face. The rash is characterized by itchiness and redness. He was exposed to nothing. Past treatments include anti-itch cream. The treatment provided mild relief.      Review of Systems  Skin: Positive for rash.  All other systems reviewed and are negative.      Objective:   Physical Exam  Constitutional: He is oriented to person, place, and time. He appears well-developed and well-nourished. No distress.  HENT:  Head: Normocephalic.  Right Ear: External ear normal.  Left Ear: External ear normal.  Mouth/Throat: Oropharynx is clear and moist.  Eyes: Pupils are equal, round, and reactive to light. Right eye exhibits no discharge. Left eye exhibits no discharge.  Neck: Normal range of motion. Neck supple. No thyromegaly present.  Cardiovascular: Normal rate, regular rhythm, normal heart sounds and intact distal pulses.   No murmur heard. Pulmonary/Chest: Effort normal and breath sounds normal. No respiratory distress. He has no wheezes.  Abdominal: Soft. Bowel sounds are normal. He exhibits no distension. There is no tenderness.  Musculoskeletal: Normal range of motion. He exhibits no edema or tenderness.  Neurological: He is alert and oriented to person, place, and time.  Skin: Skin is warm and dry. No rash noted. There is erythema (mildly erythemas patch on forehead and cheeks). There is pallor.  10 cm Closed wound on right gluteal, no discharge, warmth, or erythemas present   Psychiatric: He has a normal mood and affect. His behavior is normal. Judgment and thought content normal.  Vitals reviewed.       BP (!) 85/60   Pulse (!) 107   Temp 98.1 F (36.7 C) (Oral)   Ht 5\' 8"  (1.727 m)   Wt 240 lb (108.9 kg)   BMI 36.49 kg/m      Assessment & Plan:  1. Hypotension, unspecified hypotension type - Hemoglobin, fingerstick  2. Pale - Hemoglobin, fingerstick  3. Visit for wound check  4. Rosacea - metroNIDAZOLE (METROGEL) 0.75 % gel; Apply 1 application topically 2 (two) times daily.  Dispense: 45 g; Refill: 0  PT told to hold amlodipine and Lisinopril today Force fluids Hg 14- Pt has had hx of  Duodenal ulcer AVOID ALL GOODIES!!!! RTO in 1 week to recheck BP If he becomes light headed, dizzy, SOB to call office or go to ED  Jannifer Rodneyhristy Dafney Farler, FNP

## 2016-10-04 ENCOUNTER — Ambulatory Visit (INDEPENDENT_AMBULATORY_CARE_PROVIDER_SITE_OTHER): Payer: Medicare Other | Admitting: Family

## 2016-10-04 ENCOUNTER — Encounter: Payer: Self-pay | Admitting: Family

## 2016-10-04 VITALS — BP 106/69 | HR 103 | Temp 98.7°F | Ht 68.0 in | Wt 233.8 lb

## 2016-10-04 DIAGNOSIS — R531 Weakness: Secondary | ICD-10-CM | POA: Diagnosis not present

## 2016-10-04 DIAGNOSIS — I959 Hypotension, unspecified: Secondary | ICD-10-CM

## 2016-10-04 NOTE — Patient Instructions (Signed)
Hypotension Hypotension, commonly called low blood pressure, is when the force of blood pumping through your arteries is too weak. Arteries are blood vessels that carry blood from the heart throughout the body. When blood pressure is too low, you may not get enough blood to your brain or to the rest of your organs. This can cause weakness, light-headedness, rapid heartbeat, and fainting. Depending on the cause and severity, hypotension may be harmless (benign) or cause serious problems (critical). What are the causes? Possible causes of hypotension include:  Blood loss.  Loss of body fluids (dehydration).  Heart problems.  Hormone (endocrine) problems.  Pregnancy.  Severe infection.  Lack of certain nutrients.  Severe allergic reactions (anaphylaxis).  Certain medicines, such as blood pressure medicine or medicines that make the body lose excess fluids (diuretics). Sometimes, hypotension can be caused by not taking medicine as directed, such as taking too much of a certain medicine.  What increases the risk? Certain factors can make you more likely to develop hypotension, including:  Age. Risk increases as you get older.  Conditions that affect the heart or the central nervous system.  Taking certain medicines, such as blood pressure medicine or diuretics.  Being pregnant.  What are the signs or symptoms? Symptoms of this condition may include:  Weakness.  Light-headedness.  Dizziness.  Blurred vision.  Fatigue.  Rapid heartbeat.  Fainting, in severe cases.  How is this diagnosed? This condition is diagnosed based on:  Your medical history.  Your symptoms.  Your blood pressure measurement. Your health care provider will check your blood pressure when you are: ? Lying down. ? Sitting. ? Standing.  A blood pressure reading is recorded as two numbers, such as "120 over 80" (or 120/80). The first ("top") number is called the systolic pressure. It is a  measure of the pressure in your arteries as your heart beats. The second ("bottom") number is called the diastolic pressure. It is a measure of the pressure in your arteries when your heart relaxes between beats. Blood pressure is measured in a unit called mm Hg. Healthy blood pressure for adults is 120/80. If your blood pressure is below 90/60, you may be diagnosed with hypotension. Other information or tests that may be used to diagnose hypotension include:  Your other vital signs, such as your heart rate and temperature.  Blood tests.  Tilt table test. For this test, you will be safely secured to a table that moves you from a lying position to an upright position. Your heart rhythm and blood pressure will be monitored during the test.  How is this treated? Treatment for this condition may include:  Changing your diet. This may involve eating more salt (sodium) or drinking more water.  Taking medicines to raise your blood pressure.  Changing the dosage of certain medicines you are taking that might be lowering your blood pressure.  Wearing compression stockings. These stockings help to prevent blood clots and reduce swelling in your legs.  In some cases, you may need to go to the hospital for:  Fluid replacement. This means you will receive fluids through an IV tube.  Blood replacement. This means you will receive donated blood through an IV tube (transfusion).  Treating an infection or heart problems, if this applies.  Monitoring. You may need to be monitored while medicines that you are taking wear off.  Follow these instructions at home: Eating and drinking   Drink enough fluid to keep your urine clear or pale yellow.    Eat a healthy diet and follow instructions from your health care provider about eating or drinking restrictions. A healthy diet includes: ? Fresh fruits and vegetables. ? Whole grains. ? Lean meats. ? Low-fat dairy products.  Eat extra salt only as  directed. Do not add extra salt to your diet unless your health care provider told you to do that.  Eat frequent, small meals.  Avoid standing up suddenly after eating. Medicines  Take over-the-counter and prescription medicines only as told by your health care provider. ? Follow instructions from your health care provider about changing the dosage of your current medicines, if this applies. ? Do not stop or adjust any of your medicines on your own. General instructions  Wear compression stockings as told by your health care provider.  Get up slowly from lying down or sitting positions. This gives your blood pressure a chance to adjust.  Avoid hot showers and excessive heat as directed by your health care provider.  Return to your normal activities as told by your health care provider. Ask your health care provider what activities are safe for you.  Do not use any products that contain nicotine or tobacco, such as cigarettes and e-cigarettes. If you need help quitting, ask your health care provider.  Keep all follow-up visits as told by your health care provider. This is important. Contact a health care provider if:  You vomit.  You have diarrhea.  You have a fever for more than 2-3 days.  You feel more thirsty than usual.  You feel weak and tired. Get help right away if:  You have chest pain.  You have a fast or irregular heartbeat.  You develop numbness in any part of your body.  You cannot move your arms or your legs.  You have trouble speaking.  You become sweaty or feel light-headed.  You faint.  You feel short of breath.  You have trouble staying awake.  You feel confused. This information is not intended to replace advice given to you by your health care provider. Make sure you discuss any questions you have with your health care provider. Document Released: 02/01/2005 Document Revised: 08/22/2015 Document Reviewed: 07/24/2015 Elsevier Interactive  Patient Education  2018 Elsevier Inc.  

## 2016-10-04 NOTE — Progress Notes (Signed)
   Subjective:    Patient ID: Charles Pennington, male    DOB: Jul 30, 1965, 51 y.o.   MRN: [id number]  PT presents to the office today to recheck hypotension.  HPI PT's BP is improved today. PT has held his lisinopril 20 mg and norvasc 5 mg. PT denies any dizziness, light headedness, but states he feels weak.   Pt's Hg on 09/30/16 was 14.9.    Review of Systems  All other systems reviewed and are negative.      Objective:   Physical Exam  Constitutional: He is oriented to person, place, and time. He appears well-developed and well-nourished. No distress.  HENT:  Head: Normocephalic.  Cardiovascular: Normal rate, regular rhythm, normal heart sounds and intact distal pulses.   No murmur heard. Pulmonary/Chest: Effort normal and breath sounds normal. No respiratory distress. He has no wheezes.  Abdominal: Soft. Bowel sounds are normal. He exhibits no distension. There is no tenderness.  Musculoskeletal: Normal range of motion. He exhibits no edema or tenderness.  Neurological: He is alert and oriented to person, place, and time.  Skin: Skin is warm and dry. No rash noted. No erythema.  Psychiatric: He has a normal mood and affect. His behavior is normal. Judgment and thought content normal.  Vitals reviewed.    BP 106/69   Pulse (!) 103   Temp 98.7 F (37.1 C) (Oral)   Ht 5\' 8"  (1.727 m)   Wt 233 lb 12.8 oz (106.1 kg)   BMI 35.55 kg/m      Assessment & Plan:  1. Hypotension, unspecified hypotension type - CBC with Differential/Platelet  2. Weakness - CBC with Differential/Platelet  Pt's BP has improved Will continue to hold Lisinopril and Norvasc CBC pending Keep chronic follow up, pt to get eye exam soon  [name], FNP

## 2016-10-05 LAB — CBC WITH DIFFERENTIAL/PLATELET
BASOS ABS: 0 10*3/uL (ref 0.0–0.2)
Basos: 1 %
EOS (ABSOLUTE): 0.4 10*3/uL (ref 0.0–0.4)
EOS: 5 %
HEMATOCRIT: 44.5 % (ref 37.5–51.0)
Hemoglobin: 14.6 g/dL (ref 13.0–17.7)
IMMATURE GRANULOCYTES: 0 %
Immature Grans (Abs): 0 10*3/uL (ref 0.0–0.1)
LYMPHS ABS: 2.8 10*3/uL (ref 0.7–3.1)
Lymphs: 35 %
MCH: 30.7 pg (ref 26.6–33.0)
MCHC: 32.8 g/dL (ref 31.5–35.7)
MCV: 94 fL (ref 79–97)
MONOS ABS: 0.3 10*3/uL (ref 0.1–0.9)
Monocytes: 4 %
NEUTROS PCT: 55 %
Neutrophils Absolute: 4.5 10*3/uL (ref 1.4–7.0)
PLATELETS: 247 10*3/uL (ref 150–379)
RBC: 4.75 x10E6/uL (ref 4.14–5.80)
RDW: 15.9 % — AB (ref 12.3–15.4)
WBC: 8 10*3/uL (ref 3.4–10.8)

## 2016-10-07 DIAGNOSIS — G894 Chronic pain syndrome: Secondary | ICD-10-CM | POA: Diagnosis not present

## 2016-10-12 ENCOUNTER — Telehealth: Payer: Self-pay | Admitting: Family

## 2016-10-14 DIAGNOSIS — M792 Neuralgia and neuritis, unspecified: Secondary | ICD-10-CM | POA: Diagnosis not present

## 2016-10-14 DIAGNOSIS — M961 Postlaminectomy syndrome, not elsewhere classified: Secondary | ICD-10-CM | POA: Diagnosis not present

## 2016-10-19 ENCOUNTER — Other Ambulatory Visit: Payer: Self-pay | Admitting: Family

## 2016-10-20 ENCOUNTER — Ambulatory Visit (INDEPENDENT_AMBULATORY_CARE_PROVIDER_SITE_OTHER): Payer: Medicare Other

## 2016-10-20 ENCOUNTER — Ambulatory Visit (INDEPENDENT_AMBULATORY_CARE_PROVIDER_SITE_OTHER): Payer: Medicare Other | Admitting: Family

## 2016-10-20 ENCOUNTER — Encounter: Payer: Self-pay | Admitting: Family

## 2016-10-20 ENCOUNTER — Other Ambulatory Visit: Payer: Self-pay | Admitting: Family

## 2016-10-20 VITALS — BP 109/79 | HR 107 | Temp 97.3°F | Ht 68.0 in | Wt 239.0 lb

## 2016-10-20 DIAGNOSIS — R0781 Pleurodynia: Secondary | ICD-10-CM | POA: Diagnosis not present

## 2016-10-20 DIAGNOSIS — I10 Essential (primary) hypertension: Secondary | ICD-10-CM

## 2016-10-20 DIAGNOSIS — R5383 Other fatigue: Secondary | ICD-10-CM | POA: Diagnosis not present

## 2016-10-20 DIAGNOSIS — R6889 Other general symptoms and signs: Secondary | ICD-10-CM | POA: Diagnosis not present

## 2016-10-20 DIAGNOSIS — F17200 Nicotine dependence, unspecified, uncomplicated: Secondary | ICD-10-CM | POA: Diagnosis not present

## 2016-10-20 DIAGNOSIS — R55 Syncope and collapse: Secondary | ICD-10-CM

## 2016-10-20 NOTE — Patient Instructions (Signed)
Rib Contusion A rib contusion is a deep bruise on your rib area. Contusions are the result of a blunt trauma that causes bleeding and injury to the tissues under the skin. A rib contusion may involve bruising of the ribs and of the skin and muscles in the area. The skin overlying the contusion may turn blue, purple, or yellow. Minor injuries will give you a painless contusion, but more severe contusions may stay painful and swollen for a few weeks. What are the causes? A contusion is usually caused by a blow, trauma, or direct force to an area of the body. This often occurs while playing contact sports. What are the signs or symptoms?  Swelling and redness of the injured area.  Discoloration of the injured area.  Tenderness and soreness of the injured area.  Pain with or without movement. How is this diagnosed? The diagnosis can be made by taking a medical history and performing a physical exam. An X-ray, CT scan, or MRI may be needed to determine if there were any associated injuries, such as broken bones (fractures) or internal injuries. How is this treated? Often, the best treatment for a rib contusion is rest. Icing or applying cold compresses to the injured area may help reduce swelling and inflammation. Deep breathing exercises may be recommended to reduce the risk of partial lung collapse and pneumonia. Over-the-counter or prescription medicines may also be recommended for pain control. Follow these instructions at home:  Apply ice to the injured area: ? Put ice in a plastic bag. ? Place a towel between your skin and the bag. ? Leave the ice on for 20 minutes, 2-3 times per day.  Take medicines only as directed by your health care provider.  Rest the injured area. Avoid strenuous activity and any activities or movements that cause pain. Be careful during activities and avoid bumping the injured area.  Perform deep-breathing exercises as directed by your health care provider.  Do  not lift anything that is heavier than 5 lb (2.3 kg) until your health care provider approves.  Do not use any tobacco products, including cigarettes, chewing tobacco, or electronic cigarettes. If you need help quitting, ask your health care provider. Contact a health care provider if:  You have increased bruising or swelling.  You have pain that is not controlled with treatment.  You have a fever. Get help right away if:  You have difficulty breathing or shortness of breath.  You develop a continual cough, or you cough up thick or bloody sputum.  You feel sick to your stomach (nauseous), you throw up (vomit), or you have abdominal pain. This information is not intended to replace advice given to you by your health care provider. Make sure you discuss any questions you have with your health care provider. Document Released: 10/27/2000 Document Revised: 07/10/2015 Document Reviewed: 11/13/2013 Elsevier Interactive Patient Education  2018 Elsevier Inc.  

## 2016-10-20 NOTE — Addendum Note (Signed)
Addended by: Jannifer RodneyHAWKS, Jinny Sweetland A on: 10/20/2016 04:25 PM   Modules accepted: Orders

## 2016-10-20 NOTE — Progress Notes (Addendum)
   Subjective:    Patient ID: Charles Pennington, male    DOB: Mar 13, 1965, 51 y.o.   MRN: [id number]  Pt presents to the office today with left rib pain. Pt states he walking to the restroom on Saturday and had a syncope episode. Pt is unsure what caused this, but "woke up and saw blood in the floor". Pt did not go to ED or call EMS.   Pt has a stimulator placed in lower lumbar on 10/14/16. Pt is followed by the pain clinic every month.   Pt complaining of left rib pain that is constant aching pain of 10 out 10. Pt is unsure if he hit this area when he fell.  Loss of Consciousness  This is a new problem. The current episode started in the past 7 days. Associated symptoms include back pain, dizziness and malaise/fatigue. Pertinent negatives include no light-headedness, slurred speech or weakness.         Review of Systems  Constitutional: Positive for malaise/fatigue.  Cardiovascular: Positive for syncope.  Musculoskeletal: Positive for back pain.  Neurological: Positive for dizziness. Negative for weakness and light-headedness.  All other systems reviewed and are negative.      Objective:   Physical Exam  Constitutional: He is oriented to person, place, and time. He appears well-developed and well-nourished. No distress.  HENT:  Head: Normocephalic.  Eyes: Pupils are equal, round, and reactive to light. Right eye exhibits no discharge. Left eye exhibits no discharge.  Neck: Normal range of motion. Neck supple. No thyromegaly present.  Cardiovascular: Normal rate, regular rhythm, normal heart sounds and intact distal pulses.   No murmur heard. Pulmonary/Chest: Effort normal and breath sounds normal. No respiratory distress. He has no wheezes.  Abdominal: He exhibits no distension. There is no tenderness.  Musculoskeletal: Normal range of motion. He exhibits no edema or tenderness.  Left rib with coughing and deep breathing  Neurological: He is alert and oriented to person, place,  and time.  Skin: Skin is warm and dry. No rash noted. No erythema. There is pallor.  Psychiatric: He has a normal mood and affect. His behavior is normal. Judgment and thought content normal.  Vitals reviewed.   Xray- Negative Preliminary reading by Evelina Dun, FNP [facility]   BP 109/79   Pulse (!) 107   Temp (!) 97.3 F (36.3 C) (Oral)   Ht '5\' 8"'$  (1.727 m)   Wt 239 lb (108.4 kg)   BMI 36.34 kg/m      Assessment & Plan:  1. Rib pain on left side - DG Ribs Unilateral W/Chest Left; Future  2. Syncope, unspecified syncope type - DG Ribs Unilateral W/Chest Left; Future - Anemia Profile B - CMP14+EGFR  3. Fatigue, unspecified type - TSH  4. SMOKER Smoking cessation discussed   Labs pending  Force fluids Keep follow up with Pain Clinic RTO prn   Evelina Dun, FNP

## 2016-10-21 ENCOUNTER — Other Ambulatory Visit: Payer: Self-pay | Admitting: Family

## 2016-10-21 DIAGNOSIS — D509 Iron deficiency anemia, unspecified: Secondary | ICD-10-CM

## 2016-10-21 LAB — ANEMIA PROFILE B
BASOS ABS: 0.1 10*3/uL (ref 0.0–0.2)
Basos: 1 %
EOS (ABSOLUTE): 0.6 10*3/uL — ABNORMAL HIGH (ref 0.0–0.4)
EOS: 5 %
FOLATE: 5.8 ng/mL (ref >3.0)
Ferritin: 94 ng/mL (ref 30–400)
HEMOGLOBIN: 13.2 g/dL (ref 13.0–17.7)
Hematocrit: 39 % (ref 37.5–51.0)
IMMATURE GRANULOCYTES: 0 %
IRON SATURATION: 9 % — AB (ref 15–55)
IRON: 30 ug/dL — AB (ref 38–169)
Immature Grans (Abs): 0 10*3/uL (ref 0.0–0.1)
Lymphocytes Absolute: 3.5 10*3/uL — ABNORMAL HIGH (ref 0.7–3.1)
Lymphs: 31 %
MCH: 31.7 pg (ref 26.6–33.0)
MCHC: 33.8 g/dL (ref 31.5–35.7)
MCV: 94 fL (ref 79–97)
MONOCYTES: 6 %
Monocytes Absolute: 0.7 10*3/uL (ref 0.1–0.9)
NEUTROS ABS: 6.5 10*3/uL (ref 1.4–7.0)
Neutrophils: 57 %
PLATELETS: 221 10*3/uL (ref 150–379)
RBC: 4.16 x10E6/uL (ref 4.14–5.80)
RDW: 14.8 % (ref 12.3–15.4)
RETIC CT PCT: 0.8 % (ref 0.6–2.6)
TIBC: 328 ug/dL (ref 250–450)
UIBC: 298 ug/dL (ref 111–343)
VITAMIN B 12: 381 pg/mL (ref 232–1245)
WBC: 11.3 10*3/uL — AB (ref 3.4–10.8)

## 2016-10-21 LAB — CMP14+EGFR
ALK PHOS: 62 IU/L (ref 39–117)
ALT: 24 IU/L (ref 0–44)
AST: 37 IU/L (ref 0–40)
Albumin/Globulin Ratio: 1.8 (ref 1.2–2.2)
Albumin: 4.4 g/dL (ref 3.5–5.5)
BILIRUBIN TOTAL: 0.2 mg/dL (ref 0.0–1.2)
BUN / CREAT RATIO: 9 (ref 9–20)
BUN: 10 mg/dL (ref 6–24)
CHLORIDE: 101 mmol/L (ref 96–106)
CO2: 22 mmol/L (ref 20–29)
Calcium: 9.5 mg/dL (ref 8.7–10.2)
Creatinine, Ser: 1.16 mg/dL (ref 0.76–1.27)
GFR calc Af Amer: 84 mL/min/{1.73_m2} (ref >59)
GFR calc non Af Amer: 73 mL/min/{1.73_m2} (ref >59)
GLUCOSE: 159 mg/dL — AB (ref 65–99)
Globulin, Total: 2.4 g/dL (ref 1.5–4.5)
POTASSIUM: 3.5 mmol/L (ref 3.5–5.2)
Sodium: 141 mmol/L (ref 134–144)
Total Protein: 6.8 g/dL (ref 6.0–8.5)

## 2016-10-21 LAB — TSH: TSH: 15.59 u[IU]/mL — AB (ref 0.450–4.500)

## 2016-10-25 DIAGNOSIS — E1121 Type 2 diabetes mellitus with diabetic nephropathy: Secondary | ICD-10-CM | POA: Diagnosis not present

## 2016-10-25 DIAGNOSIS — Z8673 Personal history of transient ischemic attack (TIA), and cerebral infarction without residual deficits: Secondary | ICD-10-CM | POA: Diagnosis not present

## 2016-10-25 DIAGNOSIS — Z794 Long term (current) use of insulin: Secondary | ICD-10-CM | POA: Diagnosis not present

## 2016-10-25 DIAGNOSIS — M792 Neuralgia and neuritis, unspecified: Secondary | ICD-10-CM | POA: Diagnosis not present

## 2016-10-25 DIAGNOSIS — E0842 Diabetes mellitus due to underlying condition with diabetic polyneuropathy: Secondary | ICD-10-CM | POA: Diagnosis not present

## 2016-10-25 DIAGNOSIS — M961 Postlaminectomy syndrome, not elsewhere classified: Secondary | ICD-10-CM | POA: Diagnosis not present

## 2016-10-28 NOTE — Telephone Encounter (Signed)
Pt calling to check on diabetic shoes. Please call him at [phone] to let him know the status .

## 2016-10-30 ENCOUNTER — Other Ambulatory Visit: Payer: Self-pay | Admitting: Family

## 2016-11-01 ENCOUNTER — Telehealth: Payer: Self-pay | Admitting: *Deleted

## 2016-11-01 NOTE — Telephone Encounter (Signed)
Left message , Lyrica script ready for pick up at front office.

## 2016-11-01 NOTE — Telephone Encounter (Signed)
Last seen 10/20/16  Charles Pennington 

## 2016-11-01 NOTE — Telephone Encounter (Signed)
RX ready for pick up 

## 2016-11-02 NOTE — Telephone Encounter (Signed)
Pt is trying to get shoes from [facility] pharmacy. Forms have not been received here.  Gave pt main fax number for forms to be refaxed to

## 2016-11-05 ENCOUNTER — Ambulatory Visit (INDEPENDENT_AMBULATORY_CARE_PROVIDER_SITE_OTHER): Payer: Medicare Other | Admitting: Family

## 2016-11-05 ENCOUNTER — Encounter: Payer: Self-pay | Admitting: Family

## 2016-11-05 ENCOUNTER — Other Ambulatory Visit: Payer: Self-pay | Admitting: Family

## 2016-11-05 VITALS — BP 119/81 | HR 105 | Temp 97.8°F | Ht 68.0 in | Wt 240.0 lb

## 2016-11-05 DIAGNOSIS — R42 Dizziness and giddiness: Secondary | ICD-10-CM | POA: Diagnosis not present

## 2016-11-05 DIAGNOSIS — I10 Essential (primary) hypertension: Secondary | ICD-10-CM

## 2016-11-05 DIAGNOSIS — E1142 Type 2 diabetes mellitus with diabetic polyneuropathy: Secondary | ICD-10-CM

## 2016-11-05 DIAGNOSIS — E119 Type 2 diabetes mellitus without complications: Secondary | ICD-10-CM

## 2016-11-05 DIAGNOSIS — F17200 Nicotine dependence, unspecified, uncomplicated: Secondary | ICD-10-CM

## 2016-11-05 DIAGNOSIS — R062 Wheezing: Secondary | ICD-10-CM | POA: Diagnosis not present

## 2016-11-05 DIAGNOSIS — J209 Acute bronchitis, unspecified: Secondary | ICD-10-CM | POA: Diagnosis not present

## 2016-11-05 DIAGNOSIS — S0990XA Unspecified injury of head, initial encounter: Secondary | ICD-10-CM

## 2016-11-05 DIAGNOSIS — J440 Chronic obstructive pulmonary disease with acute lower respiratory infection: Secondary | ICD-10-CM

## 2016-11-05 DIAGNOSIS — Z794 Long term (current) use of insulin: Secondary | ICD-10-CM

## 2016-11-05 DIAGNOSIS — G44319 Acute post-traumatic headache, not intractable: Secondary | ICD-10-CM | POA: Diagnosis not present

## 2016-11-05 MED ORDER — FLUTICASONE-UMECLIDIN-VILANT 100-62.5-25 MCG/INH IN AEPB: 1.0000 | INHALATION_SPRAY | Freq: Every day | RESPIRATORY_TRACT | 2 refills | Status: DC

## 2016-11-05 MED ORDER — PREDNISONE 20 MG PO TABS: ORAL_TABLET | ORAL | 0 refills | Status: DC

## 2016-11-05 NOTE — Patient Instructions (Signed)

## 2016-11-05 NOTE — Progress Notes (Signed)
Subjective:    Patient ID: Charles Pennington, male    DOB: 11-07-1965, 51 y.o.   MRN: [id number]  Dizziness  This is a recurrent problem. The current episode started more than 1 year ago. Associated symptoms include headaches. Pertinent negatives include no abdominal pain, numbness or visual change.  Fall  The accident occurred 3 to 5 days ago. The fall occurred while standing (while in bathtub). He fell from a height of 3 to 5 ft. He landed on hard floor. There was no blood loss. The point of impact was the head. The pain is present in the head. The pain is at a severity of 8/10. The pain is moderate. The symptoms are aggravated by standing. Associated symptoms include headaches. Pertinent negatives include no abdominal pain, bowel incontinence, hearing loss, loss of consciousness, numbness, tingling or visual change. He has tried rest, acetaminophen and NSAID for the symptoms. The treatment provided mild relief.  Wheezing   This is a chronic problem. The current episode started more than 1 year ago. The problem has been waxing and waning. Associated symptoms include headaches and shortness of breath. Pertinent negatives include no abdominal pain. The symptoms are aggravated by lying flat.      Review of Systems  Respiratory: Positive for shortness of breath and wheezing.   Gastrointestinal: Negative for abdominal pain and bowel incontinence.  Neurological: Positive for dizziness and headaches. Negative for tingling, loss of consciousness and numbness.  All other systems reviewed and are negative.      Objective:   Physical Exam  Constitutional: He is oriented to person, place, and time. He appears well-developed and well-nourished. No distress.  HENT:  Head: Normocephalic.  Right Ear: External ear normal.  Left Ear: External ear normal.  Nose: Mucosal edema and rhinorrhea present.  Mouth/Throat: Posterior oropharyngeal erythema present.  Eyes: Pupils are equal, round, and reactive  to light. Right eye exhibits no discharge. Left eye exhibits no discharge.  Neck: Normal range of motion. Neck supple. No thyromegaly present.  Cardiovascular: Normal rate, regular rhythm, normal heart sounds and intact distal pulses.   No murmur heard. Pulmonary/Chest: Effort normal. No respiratory distress. He has wheezes.  Abdominal: Soft. Bowel sounds are normal. He exhibits no distension. There is no tenderness.  Musculoskeletal: Normal range of motion. He exhibits no edema or tenderness.  Neurological: He is alert and oriented to person, place, and time.  Skin: Skin is warm and dry. No rash noted. No erythema.  2.5cm Laceration on left scalp  Psychiatric: He has a normal mood and affect. His behavior is normal. Judgment and thought content normal.  Vitals reviewed.     BP 119/81   Pulse (!) 105   Temp 97.8 F (36.6 C) (Oral)   Ht  (1.727 m)   Wt 240 lb (108.9 kg)   BMI 36.49 kg/m      Assessment & Plan:  1. Head injury, acute, without loss of consciousness, initial encounter - CT Head Wo Contrast; Future  2. Dizziness - CT Head Wo Contrast; Future  3. Acute post-traumatic headache, not intractable - CT Head Wo Contrast; Future  4. SMOKER Smoking cessation discussed   5. Acute bronchitis with COPD (HCC) - predniSONE (DELTASONE) 20 MG tablet; Take 3 tabs daily for 1 week, then 2 tabs for 1 week, then 1 tab for one week  Dispense: 42 tablet; Refill: 0 - Fluticasone-Umeclidin-Vilant (TRELEGY ELLIPTA) 100-62.5-25 MCG/INH AEPB; Inhale 1 puff into the lungs daily.  Dispense: 28 each; Refill:  2  6. Wheezing - predniSONE (DELTASONE) 20 MG tablet; Take 3 tabs daily for 1 week, then 2 tabs for 1 week, then 1 tab for one week  Dispense: 42 tablet; Refill: 0 - Fluticasone-Umeclidin-Vilant (TRELEGY ELLIPTA) 100-62.5-25 MCG/INH AEPB; Inhale 1 puff into the lungs daily.  Dispense: 28 each; Refill: 2  Pt states states he does not have a ride to get CT scan today. States he  walked to our office. Long discussion that is dizziness, changes in vision, gait, or speech to call 911 Pt understands Trelegy started today Smoking cessation discussed  [name], FNP

## 2016-11-09 ENCOUNTER — Ambulatory Visit (HOSPITAL_COMMUNITY): Payer: Medicare Other

## 2016-11-22 ENCOUNTER — Other Ambulatory Visit: Payer: Self-pay | Admitting: Family

## 2016-11-22 DIAGNOSIS — G4700 Insomnia, unspecified: Secondary | ICD-10-CM

## 2016-11-23 NOTE — Telephone Encounter (Signed)
Ambein refill called to pharmacy. 

## 2016-11-24 ENCOUNTER — Ambulatory Visit (INDEPENDENT_AMBULATORY_CARE_PROVIDER_SITE_OTHER): Payer: Medicare Other | Admitting: Family

## 2016-11-24 ENCOUNTER — Telehealth: Payer: Self-pay | Admitting: Family

## 2016-11-24 ENCOUNTER — Encounter: Payer: Self-pay | Admitting: Family

## 2016-11-24 VITALS — BP 129/87 | HR 101 | Temp 98.4°F | Ht 68.0 in | Wt 238.0 lb

## 2016-11-24 DIAGNOSIS — S30810A Abrasion of lower back and pelvis, initial encounter: Secondary | ICD-10-CM | POA: Diagnosis not present

## 2016-11-24 DIAGNOSIS — T148XXA Other injury of unspecified body region, initial encounter: Secondary | ICD-10-CM

## 2016-11-24 MED ORDER — PREDNISOLONE ACETATE 1 % OP SUSP: 1.0000 [drp] | Freq: Four times a day (QID) | OPHTHALMIC | 0 refills | Status: DC

## 2016-11-24 MED ORDER — MUPIROCIN 2 % EX OINT: 1.0000 "application " | TOPICAL_OINTMENT | Freq: Two times a day (BID) | CUTANEOUS | 1 refills | Status: DC

## 2016-11-24 NOTE — Telephone Encounter (Signed)
Pt eye drops is called Prednif 39meme(ask patient to spell what was on bottle) . Pt gets it from [facility] pharmacy

## 2016-11-24 NOTE — Patient Instructions (Signed)
Abrasion An abrasion is a cut or scrape on the surface of your skin. An abrasion does not go through all of the layers of your skin. It is important to take good care of your abrasion to prevent infection. Follow these instructions at home: Medicines  Take or apply medicines only as told by your doctor.  If you were prescribed an antibiotic ointment, finish all of it even if you start to feel better. Wound care  Clean the wound with mild soap and water 2-3 times per day or as told by your doctor. Pat your wound dry with a clean towel. Do not rub it.  There are many ways to close and cover a wound. Follow instructions from your doctor about: ? How to take care of your wound. ? When and how you should change your bandage (dressing). ? When and how you should take off your dressing.  Check your wound every day for signs of infection. Watch for: ? Redness, swelling, or pain. ? Fluid, blood, or pus. General instructions  Keep the dressing dry as told by your doctor. Do not take baths, swim, use a hot tub, or do anything that would put your wound underwater until your doctor says it is okay.  If there is swelling, raise (elevate) the injured area above the level of your heart while you are sitting or lying down.  Keep all follow-up visits as told by your doctor. This is important. Contact a doctor if:  You were given a tetanus shot and you have any of these where the needle went in: ? Swelling. ? Very bad pain. ? Redness. ? Bleeding.  Medicine does not help your pain.  You have any of these at the site of the wound: ? More redness. ? More swelling. ? More pain. Get help right away if:  You have a red streak going away from your wound.  You have a fever.  You have fluid, blood, or pus coming from your wound.  There is a bad smell coming from your wound. This information is not intended to replace advice given to you by your health care provider. Make sure you discuss any  questions you have with your health care provider. Document Released: 07/21/2007 Document Revised: 07/10/2015 Document Reviewed: 01/30/2014 Elsevier Interactive Patient Education  2018 Elsevier Inc.  
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
## 2016-11-24 NOTE — Telephone Encounter (Signed)
Prescription sent to pharmacy.

## 2016-11-29 ENCOUNTER — Ambulatory Visit (HOSPITAL_COMMUNITY): Payer: Medicare Other

## 2016-12-02 ENCOUNTER — Other Ambulatory Visit: Payer: Self-pay | Admitting: Family

## 2016-12-07 DIAGNOSIS — M961 Postlaminectomy syndrome, not elsewhere classified: Secondary | ICD-10-CM | POA: Diagnosis not present

## 2016-12-07 DIAGNOSIS — G894 Chronic pain syndrome: Secondary | ICD-10-CM | POA: Diagnosis not present

## 2016-12-07 DIAGNOSIS — M792 Neuralgia and neuritis, unspecified: Secondary | ICD-10-CM | POA: Diagnosis not present

## 2016-12-07 DIAGNOSIS — E0842 Diabetes mellitus due to underlying condition with diabetic polyneuropathy: Secondary | ICD-10-CM | POA: Diagnosis not present

## 2016-12-14 ENCOUNTER — Other Ambulatory Visit: Payer: Self-pay | Admitting: Family

## 2016-12-14 DIAGNOSIS — F32A Depression, unspecified: Secondary | ICD-10-CM

## 2016-12-14 DIAGNOSIS — F329 Major depressive disorder, single episode, unspecified: Secondary | ICD-10-CM

## 2016-12-14 DIAGNOSIS — F411 Generalized anxiety disorder: Secondary | ICD-10-CM

## 2016-12-16 ENCOUNTER — Telehealth: Payer: Self-pay | Admitting: Family

## 2016-12-17 NOTE — Telephone Encounter (Signed)
Written rx, ready for pick up

## 2016-12-17 NOTE — Telephone Encounter (Signed)
Aware. Script for shoes ready at front office.

## 2016-12-23 ENCOUNTER — Other Ambulatory Visit: Payer: Self-pay | Admitting: Family

## 2016-12-23 DIAGNOSIS — Z794 Long term (current) use of insulin: Secondary | ICD-10-CM

## 2016-12-23 DIAGNOSIS — K219 Gastro-esophageal reflux disease without esophagitis: Secondary | ICD-10-CM

## 2016-12-23 DIAGNOSIS — E1142 Type 2 diabetes mellitus with diabetic polyneuropathy: Secondary | ICD-10-CM

## 2016-12-23 DIAGNOSIS — E785 Hyperlipidemia, unspecified: Secondary | ICD-10-CM

## 2016-12-23 DIAGNOSIS — E119 Type 2 diabetes mellitus without complications: Secondary | ICD-10-CM

## 2016-12-24 NOTE — Telephone Encounter (Signed)
Phoned in lyrica

## 2017-01-04 DIAGNOSIS — Z794 Long term (current) use of insulin: Secondary | ICD-10-CM | POA: Diagnosis not present

## 2017-01-04 DIAGNOSIS — M792 Neuralgia and neuritis, unspecified: Secondary | ICD-10-CM | POA: Diagnosis not present

## 2017-01-04 DIAGNOSIS — G8921 Chronic pain due to trauma: Secondary | ICD-10-CM | POA: Diagnosis not present

## 2017-01-04 DIAGNOSIS — Z23 Encounter for immunization: Secondary | ICD-10-CM | POA: Diagnosis not present

## 2017-01-04 DIAGNOSIS — M961 Postlaminectomy syndrome, not elsewhere classified: Secondary | ICD-10-CM | POA: Diagnosis not present

## 2017-01-04 DIAGNOSIS — E1121 Type 2 diabetes mellitus with diabetic nephropathy: Secondary | ICD-10-CM | POA: Diagnosis not present

## 2017-01-05 ENCOUNTER — Ambulatory Visit (INDEPENDENT_AMBULATORY_CARE_PROVIDER_SITE_OTHER): Payer: Medicare Other | Admitting: Family

## 2017-01-05 ENCOUNTER — Encounter: Payer: Self-pay | Admitting: Family

## 2017-01-05 VITALS — BP 120/80 | HR 94 | Temp 97.0°F | Ht 68.0 in | Wt 246.0 lb

## 2017-01-05 DIAGNOSIS — E785 Hyperlipidemia, unspecified: Secondary | ICD-10-CM

## 2017-01-05 DIAGNOSIS — Z794 Long term (current) use of insulin: Secondary | ICD-10-CM

## 2017-01-05 DIAGNOSIS — E1169 Type 2 diabetes mellitus with other specified complication: Secondary | ICD-10-CM

## 2017-01-05 DIAGNOSIS — E1142 Type 2 diabetes mellitus with diabetic polyneuropathy: Secondary | ICD-10-CM

## 2017-01-05 DIAGNOSIS — Z1211 Encounter for screening for malignant neoplasm of colon: Secondary | ICD-10-CM

## 2017-01-05 DIAGNOSIS — K219 Gastro-esophageal reflux disease without esophagitis: Secondary | ICD-10-CM

## 2017-01-05 DIAGNOSIS — F411 Generalized anxiety disorder: Secondary | ICD-10-CM

## 2017-01-05 DIAGNOSIS — F331 Major depressive disorder, recurrent, moderate: Secondary | ICD-10-CM

## 2017-01-05 DIAGNOSIS — I69993 Ataxia following unspecified cerebrovascular disease: Secondary | ICD-10-CM | POA: Diagnosis not present

## 2017-01-05 DIAGNOSIS — G8929 Other chronic pain: Secondary | ICD-10-CM

## 2017-01-05 DIAGNOSIS — F17200 Nicotine dependence, unspecified, uncomplicated: Secondary | ICD-10-CM | POA: Diagnosis not present

## 2017-01-05 DIAGNOSIS — E039 Hypothyroidism, unspecified: Secondary | ICD-10-CM | POA: Diagnosis not present

## 2017-01-05 DIAGNOSIS — Z8673 Personal history of transient ischemic attack (TIA), and cerebral infarction without residual deficits: Secondary | ICD-10-CM | POA: Diagnosis not present

## 2017-01-05 DIAGNOSIS — E1159 Type 2 diabetes mellitus with other circulatory complications: Secondary | ICD-10-CM | POA: Diagnosis not present

## 2017-01-05 DIAGNOSIS — E119 Type 2 diabetes mellitus without complications: Secondary | ICD-10-CM | POA: Diagnosis not present

## 2017-01-05 DIAGNOSIS — J4520 Mild intermittent asthma, uncomplicated: Secondary | ICD-10-CM | POA: Diagnosis not present

## 2017-01-05 DIAGNOSIS — I10 Essential (primary) hypertension: Secondary | ICD-10-CM

## 2017-01-05 DIAGNOSIS — R231 Pallor: Secondary | ICD-10-CM

## 2017-01-05 DIAGNOSIS — M5440 Lumbago with sciatica, unspecified side: Secondary | ICD-10-CM

## 2017-01-05 DIAGNOSIS — R6889 Other general symptoms and signs: Secondary | ICD-10-CM | POA: Diagnosis not present

## 2017-01-05 DIAGNOSIS — I152 Hypertension secondary to endocrine disorders: Secondary | ICD-10-CM

## 2017-01-05 LAB — BAYER DCA HB A1C WAIVED: HB A1C (BAYER DCA - WAIVED): 7.4 % — ABNORMAL HIGH (ref <7.0)

## 2017-01-05 MED ORDER — OMEPRAZOLE 20 MG PO CPDR: 20.0000 mg | DELAYED_RELEASE_CAPSULE | Freq: Every day | ORAL | 1 refills | Status: DC

## 2017-01-05 MED ORDER — PAROXETINE HCL 40 MG PO TABS: 40.0000 mg | ORAL_TABLET | Freq: Every day | ORAL | 1 refills | Status: DC

## 2017-01-05 NOTE — Progress Notes (Signed)
Subjective:    Patient ID: Charles Pennington, male    DOB: 01-17-1966, 51 y.o.   MRN: [id number]  Pt presents to the office today for chronic follow up. Pt is followed by [facility] monthly for chronic back pain and chronic pain.  Pt had a CVA when he turned 51 years. PT states he continue to have "balance issues related to the CVA". PT states this is stable. Diabetes  He presents for his follow-up diabetic visit. He has type 2 diabetes mellitus. His disease course has been worsening. There are no hypoglycemic associated symptoms. Associated symptoms include blurred vision, fatigue, foot paresthesias and visual change. There are no hypoglycemic complications. Symptoms are worsening. Diabetic complications include a CVA, nephropathy and peripheral neuropathy. Risk factors for coronary artery disease include dyslipidemia, family history, obesity, male sex, hypertension, stress, sedentary lifestyle and tobacco exposure. His weight is stable. He is following a generally unhealthy diet. His breakfast blood glucose range is generally 110-130 mg/dl. Eye exam is current.  Hypertension  This is a chronic problem. The current episode started more than 1 year ago. The problem has been resolved since onset. The problem is controlled. Associated symptoms include blurred vision and shortness of breath. Pertinent negatives include no malaise/fatigue or peripheral edema. Risk factors for coronary artery disease include diabetes mellitus, dyslipidemia, family history, obesity, male gender, sedentary lifestyle and smoking/tobacco exposure. The current treatment provides moderate improvement. Hypertensive end-organ damage includes CAD/MI and CVA. Identifiable causes of hypertension include a thyroid problem.  Hyperlipidemia  This is a chronic problem. The current episode started more than 1 year ago. The problem is controlled. Recent lipid tests were reviewed and are normal. Exacerbating diseases include obesity.  Associated symptoms include shortness of breath. Current antihyperlipidemic treatment includes statins. The current treatment provides moderate improvement of lipids. Risk factors for coronary artery disease include diabetes mellitus, dyslipidemia, family history, obesity, male sex, hypertension and a sedentary lifestyle.  Thyroid Problem  Presents for follow-up visit. Symptoms include depressed mood, fatigue and visual change. Patient reports no constipation, diarrhea or dry skin. His past medical history is significant for hyperlipidemia.  Asthma  He complains of cough and shortness of breath. There is no wheezing. This is a chronic problem. The current episode started more than 1 year ago. The problem occurs intermittently. The problem has been waxing and waning. The cough is non-productive. Pertinent negatives include no heartburn or malaise/fatigue. His symptoms are alleviated by rest. He reports moderate improvement on treatment. His past medical history is significant for asthma.  Gastroesophageal Reflux  He complains of coughing. He reports no belching, no heartburn or no wheezing. This is a chronic problem. The current episode started more than 1 year ago. The problem occurs occasionally. The problem has been waxing and waning. The symptoms are aggravated by smoking. Associated symptoms include fatigue. Risk factors include obesity. He has tried a PPI for the symptoms. The treatment provided moderate relief.  Diabetic Neuropathy PT taking Lyrica 150 mg  BID. Stable.     Review of Systems  Constitutional: Positive for fatigue. Negative for malaise/fatigue.  Eyes: Positive for blurred vision.  Respiratory: Positive for cough and shortness of breath. Negative for wheezing.   Gastrointestinal: Negative for constipation, diarrhea and heartburn.  All other systems reviewed and are negative.      Objective:   Physical Exam  Constitutional: He is oriented to person, place, and time. He  appears well-developed and well-nourished. No distress.  HENT:  Head: Normocephalic.  Right  Ear: External ear normal.  Left Ear: External ear normal.  Nose: Nose normal.  Mouth/Throat: Oropharynx is clear and moist.  Eyes: Pupils are equal, round, and reactive to light. Right eye exhibits no discharge. Left eye exhibits no discharge.  Neck: Normal range of motion. Neck supple. No thyromegaly present.  Cardiovascular: Normal rate, regular rhythm, normal heart sounds and intact distal pulses.  No murmur heard. Pulmonary/Chest: Effort normal. No respiratory distress. He has no wheezes. He has rhonchi.  Abdominal: Soft. Bowel sounds are normal. He exhibits no distension. There is no tenderness.  Musculoskeletal: Normal range of motion. He exhibits no edema or tenderness.  Neurological: He is alert and oriented to person, place, and time.  Skin: Skin is warm and dry. No rash noted. No erythema. There is pallor.  Psychiatric: He has a normal mood and affect. His behavior is normal. Judgment and thought content normal.  Vitals reviewed.     BP 120/80   Pulse 94   Temp (!) 97 F (36.1 C) (Oral)   Ht 5' 8"  (1.727 m)   Wt 246 lb (111.6 kg)   BMI 37.40 kg/m      Assessment & Plan:  1. Gastroesophageal reflux disease, esophagitis presence not specified - omeprazole (PRILOSEC) 20 MG capsule; Take 1 capsule (20 mg total) by mouth daily.  Dispense: 90 capsule; Refill: 1 - CMP14+EGFR  2. Depression - PARoxetine (PAXIL) 40 MG tablet; Take 1 tablet (40 mg total) by mouth daily.  Dispense: 90 tablet; Refill: 1 - CMP14+EGFR  3. GAD (generalized anxiety disorder) - PARoxetine (PAXIL) 40 MG tablet; Take 1 tablet (40 mg total) by mouth daily.  Dispense: 90 tablet; Refill: 1 - CMP14+EGFR  4. Type 2 diabetes mellitus treated with insulin ([place]) - Bayer DCA Hb A1c Waived - CMP14+EGFR - Ambulatory referral to Ophthalmology  5. Diabetic polyneuropathy associated with type 2 diabetes mellitus  (HCC) - CMP14+EGFR  6. Hypothyroidism, unspecified type  - CMP14+EGFR - TSH  7. Hypertension associated with diabetes ([place]) - CMP14+EGFR  8. Intermittent asthma without complication, unspecified asthma severity - CMP14+EGFR  9. Hyperlipidemia associated with type 2 diabetes mellitus (HCC) - CMP14+EGFR - Lipid panel  10. SMOKER  - CMP14+EGFR  11. History of CVA (cerebrovascular accident) - CMP14+EGFR  12. Ataxia, late effect of cerebrovascular disease  - CMP14+EGFR  13. Chronic low back pain with sciatica, sciatica laterality unspecified, unspecified back pain laterality - CMP14+EGFR  14. Pale  - CMP14+EGFR - Anemia Profile B  15. Colon cancer screening  - Ambulatory referral to Gastroenterology  16. Morbid obesity ([place])   Continue all meds Labs pending Health Maintenance reviewed Diet and exercise encouraged RTO 3 months   Evelina Dun, FNP

## 2017-01-05 NOTE — Patient Instructions (Signed)

## 2017-01-06 LAB — ANEMIA PROFILE B
BASOS: 1 %
Basophils Absolute: 0.1 10*3/uL (ref 0.0–0.2)
EOS (ABSOLUTE): 0.2 10*3/uL (ref 0.0–0.4)
EOS: 2 %
FOLATE: 7.2 ng/mL (ref >3.0)
Ferritin: 57 ng/mL (ref 30–400)
HEMATOCRIT: 41.3 % (ref 37.5–51.0)
HEMOGLOBIN: 14.5 g/dL (ref 13.0–17.7)
IMMATURE GRANS (ABS): 0 10*3/uL (ref 0.0–0.1)
IMMATURE GRANULOCYTES: 0 %
IRON SATURATION: 20 % (ref 15–55)
IRON: 96 ug/dL (ref 38–169)
LYMPHS: 56 %
Lymphocytes Absolute: 6 10*3/uL — ABNORMAL HIGH (ref 0.7–3.1)
MCH: 33.5 pg — ABNORMAL HIGH (ref 26.6–33.0)
MCHC: 35.1 g/dL (ref 31.5–35.7)
MCV: 95 fL (ref 79–97)
MONOCYTES: 5 %
Monocytes Absolute: 0.6 10*3/uL (ref 0.1–0.9)
NEUTROS PCT: 36 %
Neutrophils Absolute: 3.9 10*3/uL (ref 1.4–7.0)
Platelets: 246 10*3/uL (ref 150–379)
RBC: 4.33 x10E6/uL (ref 4.14–5.80)
RDW: 14.6 % (ref 12.3–15.4)
RETIC CT PCT: 1.8 % (ref 0.6–2.6)
TIBC: 482 ug/dL — AB (ref 250–450)
UIBC: 386 ug/dL — AB (ref 111–343)
Vitamin B-12: 253 pg/mL (ref 232–1245)
WBC: 10.8 10*3/uL (ref 3.4–10.8)

## 2017-01-06 LAB — CMP14+EGFR
A/G RATIO: 1.9 (ref 1.2–2.2)
ALBUMIN: 4.3 g/dL (ref 3.5–5.5)
ALK PHOS: 38 IU/L — AB (ref 39–117)
ALT: 21 IU/L (ref 0–44)
AST: 27 IU/L (ref 0–40)
BUN / CREAT RATIO: 15 (ref 9–20)
BUN: 18 mg/dL (ref 6–24)
Bilirubin Total: 0.2 mg/dL (ref 0.0–1.2)
CALCIUM: 9.5 mg/dL (ref 8.7–10.2)
CO2: 26 mmol/L (ref 20–29)
CREATININE: 1.23 mg/dL (ref 0.76–1.27)
Chloride: 102 mmol/L (ref 96–106)
GFR calc Af Amer: 79 mL/min/{1.73_m2} (ref >59)
GFR, EST NON AFRICAN AMERICAN: 68 mL/min/{1.73_m2} (ref >59)
Globulin, Total: 2.3 g/dL (ref 1.5–4.5)
Glucose: 56 mg/dL — ABNORMAL LOW (ref 65–99)
POTASSIUM: 4.2 mmol/L (ref 3.5–5.2)
Sodium: 141 mmol/L (ref 134–144)
Total Protein: 6.6 g/dL (ref 6.0–8.5)

## 2017-01-06 LAB — LIPID PANEL
CHOL/HDL RATIO: 2.2 ratio (ref 0.0–5.0)
Cholesterol, Total: 119 mg/dL (ref 100–199)
HDL: 54 mg/dL (ref >39)
LDL CALC: 47 mg/dL (ref 0–99)
TRIGLYCERIDES: 92 mg/dL (ref 0–149)
VLDL Cholesterol Cal: 18 mg/dL (ref 5–40)

## 2017-01-06 LAB — TSH: TSH: 12.74 u[IU]/mL — ABNORMAL HIGH (ref 0.450–4.500)

## 2017-01-07 ENCOUNTER — Other Ambulatory Visit: Payer: Self-pay | Admitting: *Deleted

## 2017-01-07 DIAGNOSIS — T148XXA Other injury of unspecified body region, initial encounter: Secondary | ICD-10-CM

## 2017-01-07 MED ORDER — MUPIROCIN 2 % EX OINT: 1.0000 "application " | TOPICAL_OINTMENT | Freq: Two times a day (BID) | CUTANEOUS | 0 refills | Status: DC

## 2017-01-10 ENCOUNTER — Ambulatory Visit: Payer: Medicare Other | Admitting: Family

## 2017-01-11 ENCOUNTER — Other Ambulatory Visit: Payer: Self-pay | Admitting: Family

## 2017-01-11 MED ORDER — CYCLOBENZAPRINE HCL 10 MG PO TABS: 10.0000 mg | ORAL_TABLET | Freq: Three times a day (TID) | ORAL | 0 refills | Status: DC | PRN

## 2017-01-11 NOTE — Telephone Encounter (Signed)
Pt said this was discussed at visit last week Wanted refill on Tramadol & muscle relaxer When confirmed Tramadol was controlled medication he said then just muscle relaxer Cyclobenzaprine not on current med list, last time prescribed was 07/29/15 [facility]

## 2017-01-11 NOTE — Telephone Encounter (Signed)
Flexeril Prescription sent to pharmacy   

## 2017-01-11 NOTE — Telephone Encounter (Signed)
Patient aware.

## 2017-01-21 ENCOUNTER — Encounter: Payer: Self-pay | Admitting: Family

## 2017-01-21 ENCOUNTER — Ambulatory Visit (INDEPENDENT_AMBULATORY_CARE_PROVIDER_SITE_OTHER): Payer: Medicare Other | Admitting: Family

## 2017-01-21 VITALS — BP 117/73 | HR 106 | Temp 98.4°F | Ht 68.0 in | Wt 251.8 lb

## 2017-01-21 DIAGNOSIS — R296 Repeated falls: Secondary | ICD-10-CM

## 2017-01-21 DIAGNOSIS — I69993 Ataxia following unspecified cerebrovascular disease: Secondary | ICD-10-CM

## 2017-01-21 DIAGNOSIS — Z9114 Patient's other noncompliance with medication regimen: Secondary | ICD-10-CM | POA: Diagnosis not present

## 2017-01-21 DIAGNOSIS — R413 Other amnesia: Secondary | ICD-10-CM

## 2017-01-21 DIAGNOSIS — Z91148 Patient's other noncompliance with medication regimen for other reason: Secondary | ICD-10-CM | POA: Insufficient documentation

## 2017-01-21 NOTE — Progress Notes (Signed)
   Subjective:    Patient ID: Charles Pennington, male    DOB: 1965-04-20, [age] y.o.   MRN: [id number]  HPI Pt states he needs a letter for his apt manager that he has someone sitting with him. Pt currently has someone sitting with him for 12 hours a day to help him take his medications and dressing changes to his gluteal area. PT has also fallen several times and had hit his head.  Pt has hx of CVA with ataxia.   PT does have a history of noncompliance and has not taken his medications or two or three of the same medication by accident. Pt states his sitter just helps him. Pt states he pays this sitter. States his mother use to do this, but is too busy now.    Review of Systems  Musculoskeletal: Positive for arthralgias and back pain.  All other systems reviewed and are negative.      Objective:   Physical Exam  Constitutional: He is oriented to person, place, and time. He appears well-developed and well-nourished. No distress.  HENT:  Head: Normocephalic.  Eyes: Pupils are equal, round, and reactive to light. Right eye exhibits no discharge. Left eye exhibits no discharge.  Neck: Normal range of motion. Neck supple. No thyromegaly present.  Cardiovascular: Normal rate, regular rhythm, normal heart sounds and intact distal pulses.  No murmur heard. Pulmonary/Chest: Effort normal and breath sounds normal. No respiratory distress. He has no wheezes.  Abdominal: Soft. Bowel sounds are normal. He exhibits no distension. There is no tenderness.  Musculoskeletal: Normal range of motion. He exhibits tenderness (pain in lower lumbar with extension ). He exhibits no edema.  Neurological: He is alert and oriented to person, place, and time.  Skin: Skin is warm and dry. No rash noted. No erythema. There is pallor.  Psychiatric: He has a normal mood and affect. His behavior is normal. Judgment and thought content normal.  Vitals reviewed.    BP 117/73   Pulse (!) 106   Temp 98.4 F (36.9 C)  (Oral)   Ht 5\' 8"  (1.727 m)   Wt 251 lb 12.8 oz (114.2 kg)   BMI 38.29 kg/m      Assessment & Plan:  1. Memory difficulties  2. Ataxia, late effect of cerebrovascular disease  3. Multiple falls  4. Noncompliance with medications   Will write note for patient to give his apartment manager that it would be beneficial to have someone help him. I am not sure why patient needs this note for his apartment manager. 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 Keep chronic follow up appts   Jannifer Rodneyhristy Taelyr Jantz, FNP

## 2017-01-21 NOTE — Patient Instructions (Signed)

## 2017-01-27 DIAGNOSIS — H1812 Bullous keratopathy, left eye: Secondary | ICD-10-CM | POA: Diagnosis not present

## 2017-01-27 DIAGNOSIS — H2513 Age-related nuclear cataract, bilateral: Secondary | ICD-10-CM | POA: Diagnosis not present

## 2017-01-27 DIAGNOSIS — Z947 Corneal transplant status: Secondary | ICD-10-CM | POA: Diagnosis not present

## 2017-01-27 DIAGNOSIS — H1820 Unspecified corneal edema: Secondary | ICD-10-CM | POA: Diagnosis not present

## 2017-01-27 DIAGNOSIS — E119 Type 2 diabetes mellitus without complications: Secondary | ICD-10-CM | POA: Diagnosis not present

## 2017-01-27 LAB — HM DIABETES EYE EXAM

## 2017-01-31 ENCOUNTER — Other Ambulatory Visit: Payer: Self-pay | Admitting: Family

## 2017-01-31 DIAGNOSIS — Z794 Long term (current) use of insulin: Secondary | ICD-10-CM

## 2017-01-31 DIAGNOSIS — E1142 Type 2 diabetes mellitus with diabetic polyneuropathy: Secondary | ICD-10-CM

## 2017-01-31 DIAGNOSIS — E119 Type 2 diabetes mellitus without complications: Secondary | ICD-10-CM

## 2017-01-31 NOTE — Telephone Encounter (Signed)
Lmtcb, verifying checking BS BID & correct reorder

## 2017-01-31 NOTE — Telephone Encounter (Signed)
What is the name of the medication? Stick pens that goes on the end of insulin needle. Patient is out.  Have you contacted your pharmacy to request a refill? NO  Which [facility]   Patient notified that their request is being sent to the clinical staff for review and that they should receive a call once it is complete. If they do not receive a call within 24 hours they can check with their pharmacy or our office.

## 2017-02-02 DIAGNOSIS — Z79899 Other long term (current) drug therapy: Secondary | ICD-10-CM | POA: Diagnosis not present

## 2017-02-02 DIAGNOSIS — M792 Neuralgia and neuritis, unspecified: Secondary | ICD-10-CM | POA: Diagnosis not present

## 2017-02-02 DIAGNOSIS — Z5181 Encounter for therapeutic drug level monitoring: Secondary | ICD-10-CM | POA: Diagnosis not present

## 2017-02-02 DIAGNOSIS — E1121 Type 2 diabetes mellitus with diabetic nephropathy: Secondary | ICD-10-CM | POA: Diagnosis not present

## 2017-02-02 DIAGNOSIS — M961 Postlaminectomy syndrome, not elsewhere classified: Secondary | ICD-10-CM | POA: Diagnosis not present

## 2017-02-02 DIAGNOSIS — G8921 Chronic pain due to trauma: Secondary | ICD-10-CM | POA: Diagnosis not present

## 2017-02-02 MED ORDER — INSULIN PEN NEEDLE 31G X 4 MM MISC: 1.0000 | Freq: Two times a day (BID) | 2 refills | Status: DC

## 2017-02-11 ENCOUNTER — Encounter: Payer: Self-pay | Admitting: *Deleted

## 2017-03-04 ENCOUNTER — Other Ambulatory Visit: Payer: Self-pay | Admitting: Family

## 2017-03-23 ENCOUNTER — Other Ambulatory Visit: Payer: Self-pay | Admitting: Family

## 2017-03-23 DIAGNOSIS — T148XXA Other injury of unspecified body region, initial encounter: Secondary | ICD-10-CM

## 2017-03-25 ENCOUNTER — Encounter: Payer: Self-pay | Admitting: Family

## 2017-03-25 ENCOUNTER — Ambulatory Visit (INDEPENDENT_AMBULATORY_CARE_PROVIDER_SITE_OTHER): Payer: Medicare Other | Admitting: Family

## 2017-03-25 VITALS — BP 121/76 | HR 103 | Temp 97.1°F | Ht 68.0 in | Wt 251.8 lb

## 2017-03-25 DIAGNOSIS — E119 Type 2 diabetes mellitus without complications: Secondary | ICD-10-CM

## 2017-03-25 DIAGNOSIS — Z794 Long term (current) use of insulin: Secondary | ICD-10-CM | POA: Diagnosis not present

## 2017-03-25 DIAGNOSIS — Z9114 Patient's other noncompliance with medication regimen: Secondary | ICD-10-CM | POA: Diagnosis not present

## 2017-03-25 DIAGNOSIS — E1159 Type 2 diabetes mellitus with other circulatory complications: Secondary | ICD-10-CM | POA: Diagnosis not present

## 2017-03-25 DIAGNOSIS — E785 Hyperlipidemia, unspecified: Secondary | ICD-10-CM | POA: Diagnosis not present

## 2017-03-25 DIAGNOSIS — E1142 Type 2 diabetes mellitus with diabetic polyneuropathy: Secondary | ICD-10-CM | POA: Diagnosis not present

## 2017-03-25 DIAGNOSIS — J4520 Mild intermittent asthma, uncomplicated: Secondary | ICD-10-CM | POA: Diagnosis not present

## 2017-03-25 DIAGNOSIS — E039 Hypothyroidism, unspecified: Secondary | ICD-10-CM

## 2017-03-25 DIAGNOSIS — Z8673 Personal history of transient ischemic attack (TIA), and cerebral infarction without residual deficits: Secondary | ICD-10-CM

## 2017-03-25 DIAGNOSIS — I10 Essential (primary) hypertension: Secondary | ICD-10-CM | POA: Diagnosis not present

## 2017-03-25 DIAGNOSIS — E1169 Type 2 diabetes mellitus with other specified complication: Secondary | ICD-10-CM | POA: Diagnosis not present

## 2017-03-25 LAB — BAYER DCA HB A1C WAIVED: HB A1C: 7.5 % — AB (ref <7.0)

## 2017-03-25 NOTE — Progress Notes (Signed)
 Subjective:    Patient ID: Charles Pennington, male    DOB: 04/27/1965, 52 y.o.   MRN: [id number]  Pt presents to the office today for chronic follow up. Pt had a CVA when he turned 31 years. PT states he continue to have "balance issues related to the CVA". PT states this is stable.   Pt is noncompliant with his medications. States he tries to "take one of each".   He is also requesting a prescription for  Diabetic shoes.  Diabetes  He presents for his follow-up diabetic visit. He has type 2 diabetes mellitus. His disease course has been stable. There are no hypoglycemic associated symptoms. Associated symptoms include blurred vision, fatigue and visual change. There are no hypoglycemic complications. Symptoms are worsening. Diabetic complications include heart disease. Pertinent negatives for diabetic complications include no peripheral neuropathy. Risk factors for coronary artery disease include diabetes mellitus, dyslipidemia, family history, obesity, male sex, hypertension and sedentary lifestyle. He is following a generally unhealthy diet. His breakfast blood glucose range is generally >200 mg/dl. Eye exam is current.  Hypertension  This is a chronic problem. The current episode started more than 1 year ago. The problem has been resolved since onset. The problem is controlled. Associated symptoms include blurred vision. Pertinent negatives include no peripheral edema or shortness of breath. Risk factors for coronary artery disease include dyslipidemia, diabetes mellitus, family history, obesity, male gender and smoking/tobacco exposure. The current treatment provides moderate improvement. Hypertensive end-organ damage includes CAD/MI. There is no history of kidney disease. Identifiable causes of hypertension include a thyroid problem.  Hyperlipidemia  This is a chronic problem. The current episode started more than 1 year ago. The problem is controlled. Recent lipid tests were reviewed and are  normal. Exacerbating diseases include obesity. Pertinent negatives include no shortness of breath. Current antihyperlipidemic treatment includes statins. The current treatment provides moderate improvement of lipids. Risk factors for coronary artery disease include dyslipidemia, diabetes mellitus, family history, male sex, hypertension and a sedentary lifestyle.  Thyroid Problem  Presents for follow-up visit. Symptoms include fatigue, hoarse voice and visual change. Patient reports no diarrhea. The symptoms have been stable. His past medical history is significant for hyperlipidemia.  Asthma  He complains of hoarse voice and wheezing. There is no shortness of breath. This is a chronic problem. His past medical history is significant for asthma.  Diabetic Neuropathy Pt has constant burning pain of 8 out 10. Takes Lyrica daily that helps.     Review of Systems  Constitutional: Positive for fatigue.  HENT: Positive for hoarse voice.   Eyes: Positive for blurred vision.  Respiratory: Positive for wheezing. Negative for shortness of breath.   Gastrointestinal: Negative for diarrhea.  All other systems reviewed and are negative.      Objective:   Physical Exam  Constitutional: He is oriented to person, place, and time. He appears well-developed and well-nourished. No distress.  HENT:  Head: Normocephalic.  Right Ear: External ear normal.  Left Ear: External ear normal.  Nose: Nose normal.  Mouth/Throat: Oropharynx is clear and moist.  Eyes: Pupils are equal, round, and reactive to light. Right eye exhibits no discharge. Left eye exhibits no discharge.  Neck: Normal range of motion. Neck supple. No thyromegaly present.  Cardiovascular: Normal rate, regular rhythm, normal heart sounds and intact distal pulses.  No murmur heard. Pulmonary/Chest: Effort normal. No respiratory distress. He has wheezes.  Abdominal: Soft. Bowel sounds are normal. He exhibits no distension. There is no    tenderness.  Musculoskeletal: Normal range of motion. He exhibits no edema or tenderness.  Neurological: He is alert and oriented to person, place, and time.  Skin: Skin is warm and dry. No rash noted. No erythema.  Psychiatric: He has a normal mood and affect. His behavior is normal. Judgment and thought content normal.  Vitals reviewed.    Diabetic Foot Exam - Simple   Simple Foot Form Diabetic Foot exam was performed with the following findings:  Yes 03/25/2017  9:43 AM  Visual Inspection Sensation Testing Pulse Check Comments Callus formation on ball of bilateral feet     BP 121/76   Pulse (!) 103   Temp (!) 97.1 F (36.2 C) (Oral)   Ht 5' 8" (1.727 m)   Wt 251 lb 12.8 oz (114.2 kg)   BMI 38.29 kg/m      Assessment & Plan:  1. Type 2 diabetes mellitus treated with insulin (HCC) - Bayer DCA Hb A1c Waived - CMP14+EGFR  2. Hyperlipidemia associated with type 2 diabetes mellitus ([place]) - CMP14+EGFR  3. Diabetic polyneuropathy associated with type 2 diabetes mellitus (HCC) - Bayer DCA Hb A1c Waived - CMP14+EGFR  4. Hypothyroidism, unspecified type - CMP14+EGFR - TSH  5. Hypertension associated with diabetes ([place])  - CMP14+EGFR  6. Intermittent asthma without complication, unspecified asthma severity - CMP14+EGFR  7. History of CVA (cerebrovascular accident) - CMP14+EGFR  8. Morbid obesity ([place])  - CMP14+EGFR  9. Noncompliance with medications - CMP14+EGFR   Continue all meds Labs pending Health Maintenance reviewed Diet and exercise encouraged RTO 3 months   Evelina Dun, FNP

## 2017-03-25 NOTE — Patient Instructions (Signed)
Diabetes Mellitus and Nutrition When you have diabetes (diabetes mellitus), it is very important to have healthy eating habits because your blood sugar (glucose) levels are greatly affected by what you eat and drink. Eating healthy foods in the appropriate amounts, at about the same times every day, can help you:  Control your blood glucose.  Lower your risk of heart disease.  Improve your blood pressure.  Reach or maintain a healthy weight.  Every person with diabetes is different, and each person has different needs for a meal plan. Your health care provider may recommend that you work with a diet and nutrition specialist (dietitian) to make a meal plan that is best for you. Your meal plan may vary depending on factors such as:  The calories you need.  The medicines you take.  Your weight.  Your blood glucose, blood pressure, and cholesterol levels.  Your activity level.  Other health conditions you have, such as heart or kidney disease.  How do carbohydrates affect me? Carbohydrates affect your blood glucose level more than any other type of food. Eating carbohydrates naturally increases the amount of glucose in your blood. Carbohydrate counting is a method for keeping track of how many carbohydrates you eat. Counting carbohydrates is important to keep your blood glucose at a healthy level, especially if you use insulin or take certain oral diabetes medicines. It is important to know how many carbohydrates you can safely have in each meal. This is different for every person. Your dietitian can help you calculate how many carbohydrates you should have at each meal and for snack. Foods that contain carbohydrates include:  Bread, cereal, rice, pasta, and crackers.  Potatoes and corn.  Peas, beans, and lentils.  Milk and yogurt.  Fruit and juice.  Desserts, such as cakes, cookies, ice cream, and candy.  How does alcohol affect me? Alcohol can cause a sudden decrease in blood  glucose (hypoglycemia), especially if you use insulin or take certain oral diabetes medicines. Hypoglycemia can be a life-threatening condition. Symptoms of hypoglycemia (sleepiness, dizziness, and confusion) are similar to symptoms of having too much alcohol. If your health care provider says that alcohol is safe for you, follow these guidelines:  Limit alcohol intake to no more than 1 drink per day for nonpregnant women and 2 drinks per day for men. One drink equals 12 oz of beer, 5 oz of wine, or 1 oz of hard liquor.  Do not drink on an empty stomach.  Keep yourself hydrated with water, diet soda, or unsweetened iced tea.  Keep in mind that regular soda, juice, and other mixers may contain a lot of sugar and must be counted as carbohydrates.  What are tips for following this plan? Reading food labels  Start by checking the serving size on the label. The amount of calories, carbohydrates, fats, and other nutrients listed on the label are based on one serving of the food. Many foods contain more than one serving per package.  Check the total grams (g) of carbohydrates in one serving. You can calculate the number of servings of carbohydrates in one serving by dividing the total carbohydrates by 15. For example, if a food has 30 g of total carbohydrates, it would be equal to 2 servings of carbohydrates.  Check the number of grams (g) of saturated and trans fats in one serving. Choose foods that have low or no amount of these fats.  Check the number of milligrams (mg) of sodium in one serving. Most people   should limit total sodium intake to less than 2,300 mg per day.  Always check the nutrition information of foods labeled as "low-fat" or "nonfat". These foods may be higher in added sugar or refined carbohydrates and should be avoided.  Talk to your dietitian to identify your daily goals for nutrients listed on the label. Shopping  Avoid buying canned, premade, or processed foods. These  foods tend to be high in fat, sodium, and added sugar.  Shop around the outside edge of the grocery store. This includes fresh fruits and vegetables, bulk grains, fresh meats, and fresh dairy. Cooking  Use low-heat cooking methods, such as baking, instead of high-heat cooking methods like deep frying.  Cook using healthy oils, such as olive, canola, or sunflower oil.  Avoid cooking with butter, cream, or high-fat meats. Meal planning  Eat meals and snacks regularly, preferably at the same times every day. Avoid going long periods of time without eating.  Eat foods high in fiber, such as fresh fruits, vegetables, beans, and whole grains. Talk to your dietitian about how many servings of carbohydrates you can eat at each meal.  Eat 4-6 ounces of lean protein each day, such as lean meat, chicken, fish, eggs, or tofu. 1 ounce is equal to 1 ounce of meat, chicken, or fish, 1 egg, or 1/4 cup of tofu.  Eat some foods each day that contain healthy fats, such as avocado, nuts, seeds, and fish. Lifestyle   Check your blood glucose regularly.  Exercise at least 30 minutes 5 or more days each week, or as told by your health care provider.  Take medicines as told by your health care provider.  Do not use any products that contain nicotine or tobacco, such as cigarettes and e-cigarettes. If you need help quitting, ask your health care provider.  Work with a counselor or diabetes educator to identify strategies to manage stress and any emotional and social challenges. What are some questions to ask my health care provider?  Do I need to meet with a diabetes educator?  Do I need to meet with a dietitian?  What number can I call if I have questions?  When are the best times to check my blood glucose? Where to find more information:  American Diabetes Association: diabetes.org/food-and-fitness/food  Academy of Nutrition and Dietetics:  www.eatright.org/resources/health/diseases-and-conditions/diabetes  National Institute of Diabetes and Digestive and Kidney Diseases (NIH): www.niddk.nih.gov/health-information/diabetes/overview/diet-eating-physical-activity Summary  A healthy meal plan will help you control your blood glucose and maintain a healthy lifestyle.  Working with a diet and nutrition specialist (dietitian) can help you make a meal plan that is best for you.  Keep in mind that carbohydrates and alcohol have immediate effects on your blood glucose levels. It is important to count carbohydrates and to use alcohol carefully. This information is not intended to replace advice given to you by your health care provider. Make sure you discuss any questions you have with your health care provider. Document Released: 10/29/2004 Document Revised: 03/08/2016 Document Reviewed: 03/08/2016 Elsevier Interactive Patient Education  2018 [employer]  

## 2017-03-26 LAB — CMP14+EGFR
ALBUMIN: 4.2 g/dL (ref 3.5–5.5)
ALT: 18 IU/L (ref 0–44)
AST: 25 IU/L (ref 0–40)
Albumin/Globulin Ratio: 1.8 (ref 1.2–2.2)
Alkaline Phosphatase: 54 IU/L (ref 39–117)
BILIRUBIN TOTAL: 0.3 mg/dL (ref 0.0–1.2)
BUN / CREAT RATIO: 15 (ref 9–20)
BUN: 18 mg/dL (ref 6–24)
CHLORIDE: 98 mmol/L (ref 96–106)
CO2: 21 mmol/L (ref 20–29)
CREATININE: 1.19 mg/dL (ref 0.76–1.27)
Calcium: 9.4 mg/dL (ref 8.7–10.2)
GFR calc non Af Amer: 71 mL/min/{1.73_m2} (ref >59)
GFR, EST AFRICAN AMERICAN: 82 mL/min/{1.73_m2} (ref >59)
GLOBULIN, TOTAL: 2.4 g/dL (ref 1.5–4.5)
Glucose: 208 mg/dL — ABNORMAL HIGH (ref 65–99)
Potassium: 4 mmol/L (ref 3.5–5.2)
SODIUM: 137 mmol/L (ref 134–144)
TOTAL PROTEIN: 6.6 g/dL (ref 6.0–8.5)

## 2017-03-26 LAB — TSH: TSH: 0.031 u[IU]/mL — AB (ref 0.450–4.500)

## 2017-03-29 ENCOUNTER — Other Ambulatory Visit: Payer: Self-pay | Admitting: Family

## 2017-03-29 MED ORDER — LEVOTHYROXINE SODIUM 175 MCG PO TABS: 175.0000 ug | ORAL_TABLET | Freq: Every day | ORAL | 1 refills | Status: DC

## 2017-04-01 ENCOUNTER — Other Ambulatory Visit: Payer: Self-pay | Admitting: Family

## 2017-04-01 ENCOUNTER — Other Ambulatory Visit: Payer: Self-pay

## 2017-04-01 DIAGNOSIS — K219 Gastro-esophageal reflux disease without esophagitis: Secondary | ICD-10-CM

## 2017-04-01 MED ORDER — PREGABALIN 150 MG PO CAPS: ORAL_CAPSULE | ORAL | 0 refills | Status: DC

## 2017-04-07 ENCOUNTER — Ambulatory Visit: Payer: Medicare Other | Admitting: Family

## 2017-04-08 ENCOUNTER — Other Ambulatory Visit: Payer: Self-pay | Admitting: Family

## 2017-04-25 ENCOUNTER — Other Ambulatory Visit: Payer: Self-pay | Admitting: Family

## 2017-04-25 DIAGNOSIS — G4700 Insomnia, unspecified: Secondary | ICD-10-CM

## 2017-05-09 DIAGNOSIS — G894 Chronic pain syndrome: Secondary | ICD-10-CM | POA: Diagnosis not present

## 2017-05-14 ENCOUNTER — Other Ambulatory Visit: Payer: Self-pay | Admitting: Family

## 2017-05-25 ENCOUNTER — Other Ambulatory Visit: Payer: Self-pay | Admitting: Family

## 2017-05-25 DIAGNOSIS — G4700 Insomnia, unspecified: Secondary | ICD-10-CM

## 2017-05-26 ENCOUNTER — Telehealth: Payer: Self-pay

## 2017-05-27 NOTE — Telephone Encounter (Signed)
x

## 2017-06-10 ENCOUNTER — Other Ambulatory Visit: Payer: Self-pay | Admitting: Family

## 2017-06-17 ENCOUNTER — Other Ambulatory Visit: Payer: Self-pay | Admitting: Family

## 2017-06-22 ENCOUNTER — Other Ambulatory Visit: Payer: Self-pay | Admitting: Family

## 2017-06-22 DIAGNOSIS — G4700 Insomnia, unspecified: Secondary | ICD-10-CM

## 2017-07-09 ENCOUNTER — Other Ambulatory Visit: Payer: Self-pay | Admitting: Family

## 2017-07-12 ENCOUNTER — Other Ambulatory Visit: Payer: Self-pay | Admitting: Family

## 2017-07-12 DIAGNOSIS — Z794 Long term (current) use of insulin: Secondary | ICD-10-CM

## 2017-07-12 DIAGNOSIS — E119 Type 2 diabetes mellitus without complications: Secondary | ICD-10-CM

## 2017-07-12 DIAGNOSIS — E1142 Type 2 diabetes mellitus with diabetic polyneuropathy: Secondary | ICD-10-CM

## 2017-07-12 DIAGNOSIS — I10 Essential (primary) hypertension: Secondary | ICD-10-CM

## 2017-07-12 NOTE — Telephone Encounter (Signed)
Last seen 03/25/17  Charles Pennington

## 2017-07-23 ENCOUNTER — Other Ambulatory Visit: Payer: Self-pay | Admitting: Family

## 2017-07-23 DIAGNOSIS — G4700 Insomnia, unspecified: Secondary | ICD-10-CM

## 2017-08-04 ENCOUNTER — Other Ambulatory Visit: Payer: Self-pay | Admitting: Family

## 2017-08-10 ENCOUNTER — Other Ambulatory Visit: Payer: Self-pay | Admitting: Family

## 2017-08-10 NOTE — Telephone Encounter (Signed)
Last seen 03/25/17

## 2017-08-24 ENCOUNTER — Other Ambulatory Visit: Payer: Self-pay | Admitting: Family

## 2017-08-24 DIAGNOSIS — G4700 Insomnia, unspecified: Secondary | ICD-10-CM

## 2017-08-30 NOTE — Telephone Encounter (Signed)
Patient called and left detailed that he ntbs before further RFs

## 2017-08-30 NOTE — Telephone Encounter (Signed)
Medications refilled, but Patient NTBS for follow up and lab work for next refills.

## 2017-09-01 ENCOUNTER — Ambulatory Visit (INDEPENDENT_AMBULATORY_CARE_PROVIDER_SITE_OTHER): Payer: Medicare Other | Admitting: Family

## 2017-09-01 ENCOUNTER — Encounter: Payer: Self-pay | Admitting: Family

## 2017-09-01 VITALS — BP 133/91 | HR 108 | Temp 98.5°F | Ht 68.0 in | Wt 236.0 lb

## 2017-09-01 DIAGNOSIS — H60502 Unspecified acute noninfective otitis externa, left ear: Secondary | ICD-10-CM | POA: Diagnosis not present

## 2017-09-01 MED ORDER — OFLOXACIN 0.3 % OT SOLN: 5.0000 [drp] | Freq: Every day | OTIC | 0 refills | Status: DC

## 2017-09-01 NOTE — Patient Instructions (Signed)
Otitis Externa Otitis externa is an infection of the outer ear canal. The outer ear canal is the area between the outside of the ear and the eardrum. Otitis externa is sometimes called "swimmer's ear." Follow these instructions at home:  If you were given antibiotic ear drops, use them as told by your doctor. Do not stop using them even if your condition gets better.  Take over-the-counter and prescription medicines only as told by your doctor.  Keep all follow-up visits as told by your doctor. This is important. How is this prevented?  Keep your ear dry. Use the corner of a towel to dry your ear after you swim or bathe.  Try not to scratch or put things in your ear. Doing these things makes it easier for germs to grow in your ear.  Avoid swimming in lakes, dirty water, or pools that may not have the right amount of a chemical called chlorine.  Consider making ear drops and putting 3 or 4 drops in each ear after you swim. Ask your doctor about how you can make ear drops. Contact a doctor if:  You have a fever.  After 3 days your ear is still red, swollen, or painful.  After 3 days you still have pus coming from your ear.  Your redness, swelling, or pain gets worse.  You have a really bad headache.  You have redness, swelling, pain, or tenderness behind your ear. This information is not intended to replace advice given to you by your health care provider. Make sure you discuss any questions you have with your health care provider. Document Released: 07/21/2007 Document Revised: 02/27/2015 Document Reviewed: 11/11/2014 Elsevier Interactive Patient Education  2018 Elsevier Inc.  

## 2017-09-01 NOTE — Progress Notes (Signed)
   Subjective:    Patient ID: Charles Pennington, male    DOB: 01-May-1965, 52 y.o.   MRN: [id number]  Chief Complaint  Patient presents with  . Ear Pain    Otalgia   There is pain in the left ear. This is a new problem. The current episode started in the past 7 days. The problem occurs every few minutes. The problem has been waxing and waning. There has been no fever. The pain is at a severity of 3/10. The pain is mild. Pertinent negatives include no coughing, diarrhea, hearing loss, rhinorrhea, sore throat or vomiting. He has tried nothing for the symptoms. The treatment provided no relief.      Review of Systems  HENT: Positive for ear pain. Negative for hearing loss, rhinorrhea and sore throat.   Respiratory: Negative for cough.   Gastrointestinal: Negative for diarrhea and vomiting.  All other systems reviewed and are negative.      Objective:   Physical Exam  Constitutional: He is oriented to person, place, and time. He appears well-developed and well-nourished. No distress.  HENT:  Head: Normocephalic.  Right Ear: External ear normal.  Left Ear: External ear normal. There is swelling and tenderness.  Mouth/Throat: Oropharynx is clear and moist.  Eyes: Pupils are equal, round, and reactive to light. Right eye exhibits no discharge. Left eye exhibits no discharge.  Neck: Normal range of motion. Neck supple. No thyromegaly present.  Cardiovascular: Normal rate, regular rhythm, normal heart sounds and intact distal pulses.  No murmur heard. Pulmonary/Chest: Effort normal. No respiratory distress. He has wheezes.  Abdominal: Soft. Bowel sounds are normal. He exhibits no distension. There is no tenderness.  Musculoskeletal: Normal range of motion. He exhibits no edema or tenderness.  Neurological: He is alert and oriented to person, place, and time. He has normal reflexes. No cranial nerve deficit.  Skin: Skin is warm and dry. No rash noted. No erythema.  Psychiatric: He has a  normal mood and affect. His behavior is normal. Judgment and thought content normal.  Vitals reviewed.     BP (!) 133/91   Pulse (!) 108   Temp 98.5 F (36.9 C) (Oral)   Ht 5\' 8"  (1.727 m)   Wt 236 lb (107 kg)   BMI 35.88 kg/m      Assessment & Plan:  Charles Pennington was seen today for ear pain.  Diagnoses and all orders for this visit:  Acute otitis externa of left ear, unspecified type -     ofloxacin (FLOXIN OTIC) 0.3 % OTIC solution; Place 5 drops into the left ear daily.   Keep clean and dry Do not stick anything into ear RTO in 2 weeks for chronic follow up and bring all medications  Jannifer Rodneyhristy Adyson Vanburen, FNP

## 2017-09-06 ENCOUNTER — Other Ambulatory Visit: Payer: Self-pay | Admitting: Family

## 2017-09-06 DIAGNOSIS — E1142 Type 2 diabetes mellitus with diabetic polyneuropathy: Secondary | ICD-10-CM

## 2017-09-06 DIAGNOSIS — E119 Type 2 diabetes mellitus without complications: Secondary | ICD-10-CM

## 2017-09-06 DIAGNOSIS — Z794 Long term (current) use of insulin: Secondary | ICD-10-CM

## 2017-09-09 ENCOUNTER — Telehealth: Payer: Self-pay | Admitting: Family

## 2017-09-16 ENCOUNTER — Telehealth: Payer: Self-pay | Admitting: Family

## 2017-09-16 ENCOUNTER — Ambulatory Visit: Payer: Medicare Other | Admitting: Family

## 2017-09-16 NOTE — Telephone Encounter (Signed)
FAXED Release: [id number]

## 2017-09-20 ENCOUNTER — Encounter: Payer: Self-pay | Admitting: Family

## 2017-09-22 ENCOUNTER — Other Ambulatory Visit: Payer: Self-pay | Admitting: Family

## 2017-09-27 IMAGING — CT CT ABD-PELV W/ CM
2 of 5 series · 15 of 46 positions shown, 17 images · IV contrast (APPLIED)
Comparison: 11/02/2015

CLINICAL DATA: Abdominal pain starting [REDACTED]

EXAM:
CT ABDOMEN AND PELVIS WITH CONTRAST
TECHNIQUE: Multidetector CT imaging of the abdomen and pelvis was performed
using the standard protocol following bolus administration of
intravenous contrast.
CONTRAST:  30mL NGGFEC-5JJ IOPAMIDOL (NGGFEC-5JJ) INJECTION 61%,
100mL NGGFEC-5JJ IOPAMIDOL (NGGFEC-5JJ) INJECTION 61%

[Series 2: axial st · axial · 0.84mm/px · z∈[-654,-184]mm · 12 of 108 slices shown, 14 images]
[im 7/108  soft-tissue]
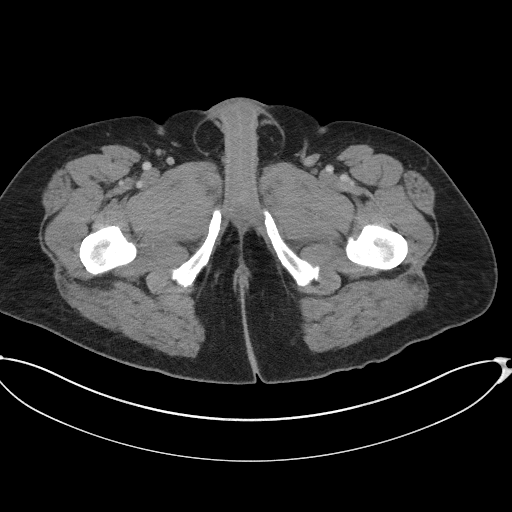
[im 7/108  bone]
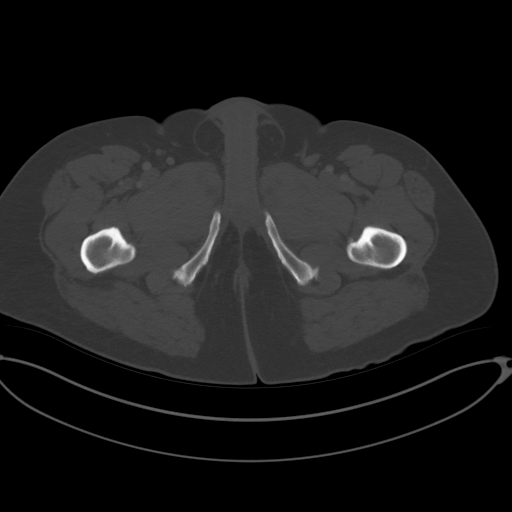
[im 19/108  soft-tissue]
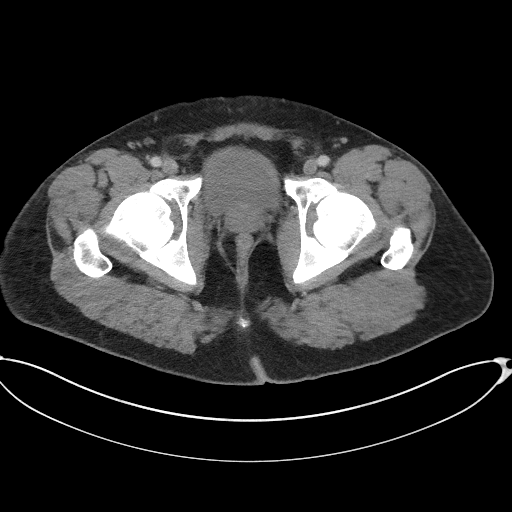
[im 26/108  soft-tissue]
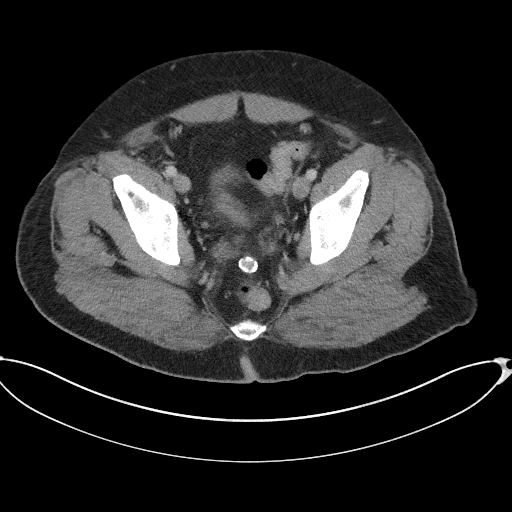
[im 32/108  soft-tissue]
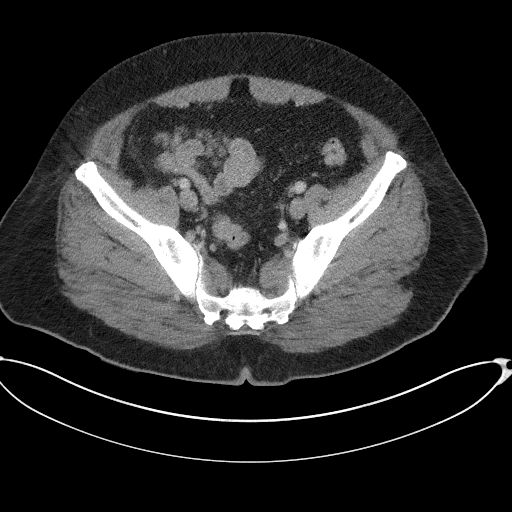
[im 45/108  soft-tissue]
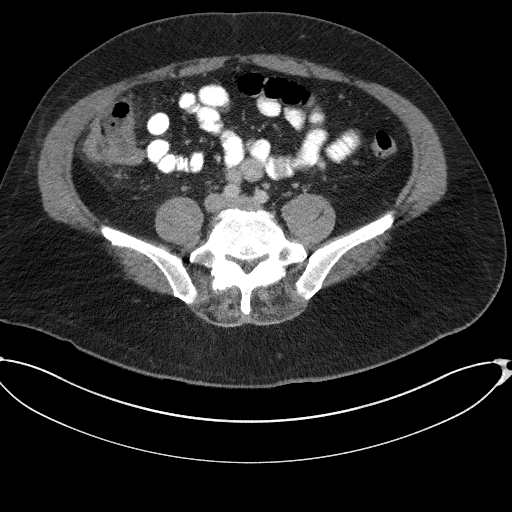
[im 51/108  soft-tissue]
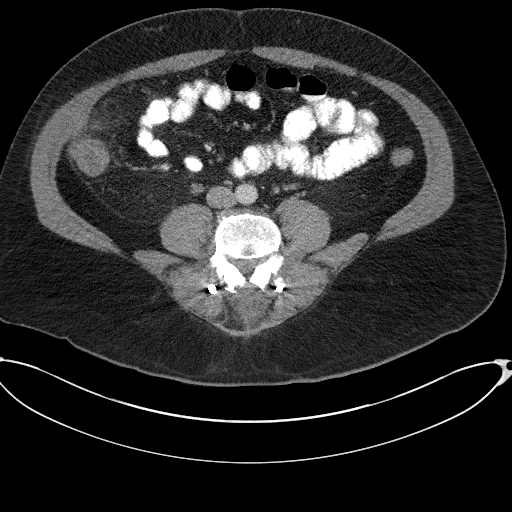
[im 57/108  soft-tissue]
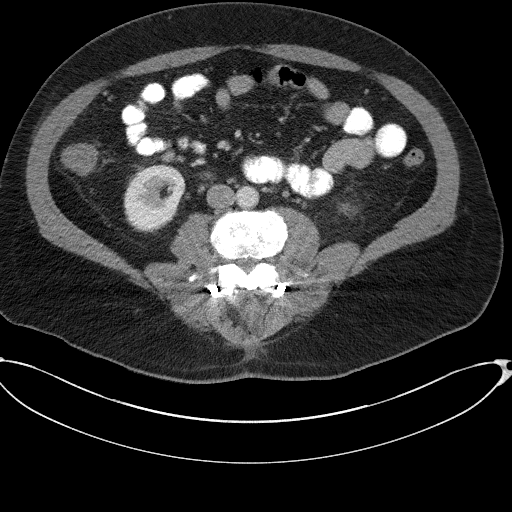
[im 70/108  soft-tissue]
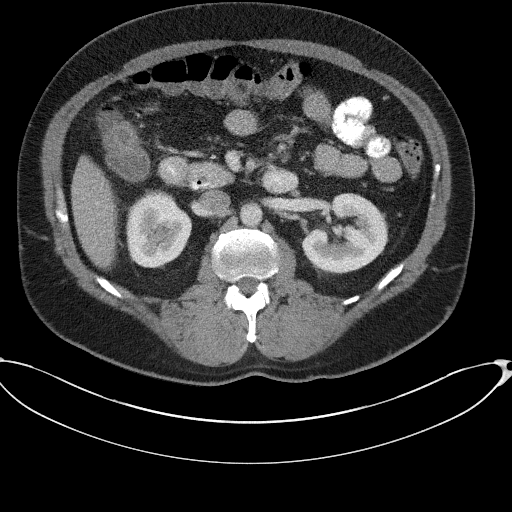
[im 76/108  soft-tissue]
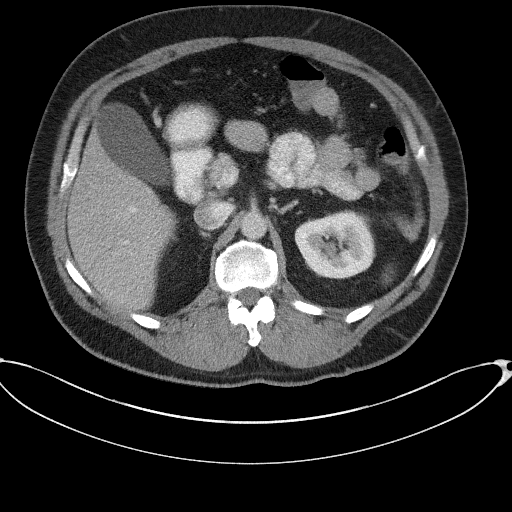
[im 76/108  bone]
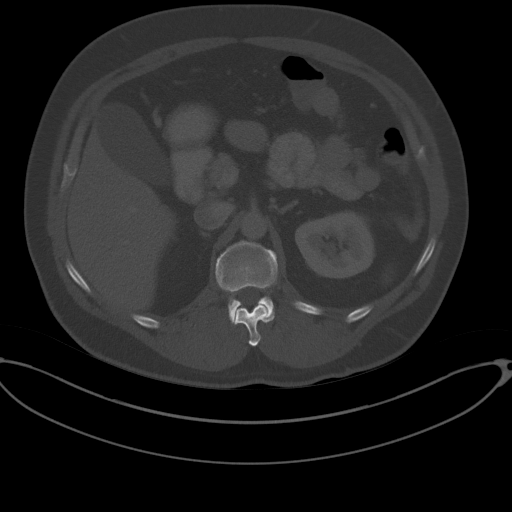
[im 82/108  soft-tissue]
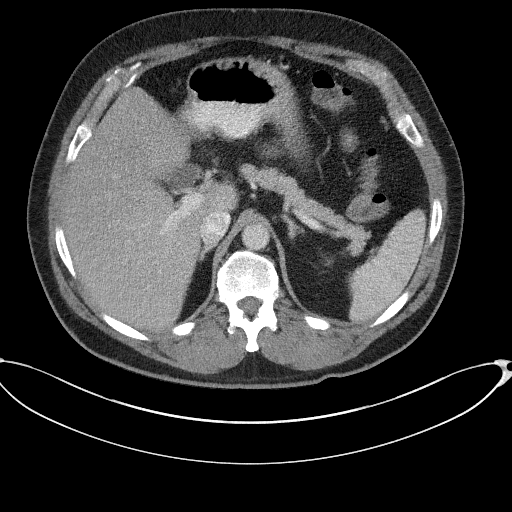
[im 95/108  soft-tissue]
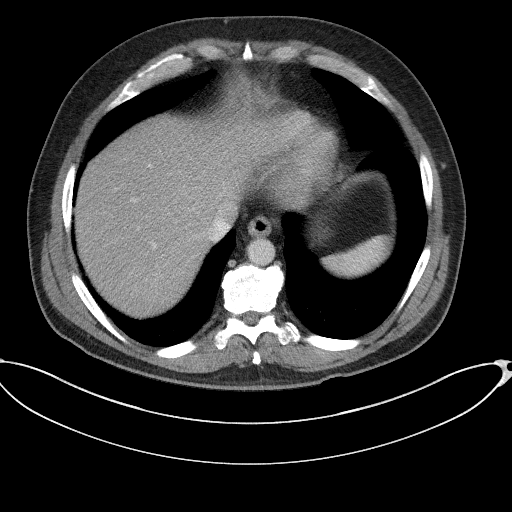
[im 101/108  soft-tissue]
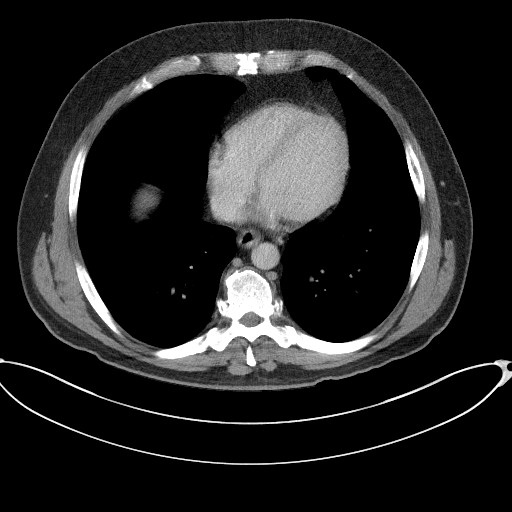

[Series 5: coronal st · coronal · 0.90mm/px · 3 of 101 slices shown]
[im 34/101  soft-tissue]
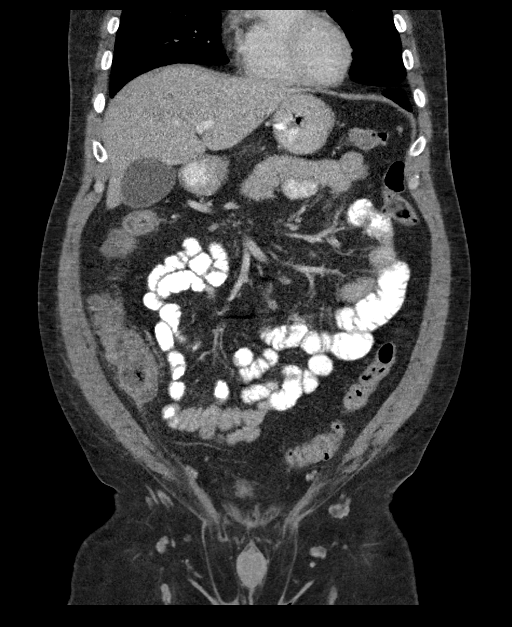
[im 45/101  soft-tissue]
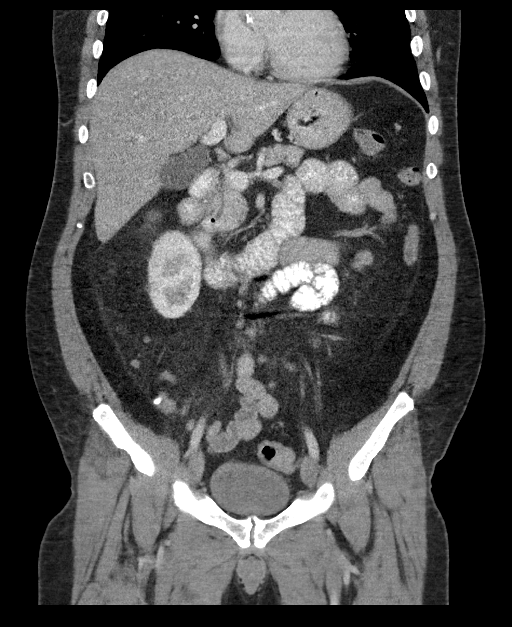
[im 56/101  soft-tissue]
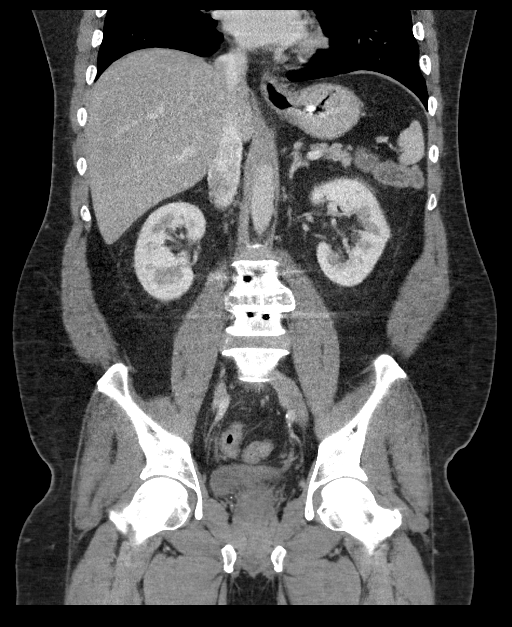

[15 of 46 positions shown; findings below may reference images not displayed]

FINDINGS: Lower chest: Lung bases are unremarkable.

Hepatobiliary: Mild fatty infiltration of the liver. No calcified
gallstones are noted within gallbladder.

Pancreas: Unremarkable. No pancreatic ductal dilatation or
surrounding inflammatory changes.

Spleen: Normal in size without focal abnormality.

Adrenals/Urinary Tract: No adrenal gland mass. Enhanced kidneys are
symmetrical in size. No hydronephrosis or hydroureter.

Delayed renal images shows bilateral renal symmetrical excretion.
Bilateral visualized proximal ureter is unremarkable.

Nonspecific mild thickening of anterior wall of urinary bladder.
Mild cystitis cannot be excluded.

Stomach/Bowel: No gastric outlet obstruction. No small bowel
obstruction. Normal appendix. The terminal ileum is unremarkable.
There is mild thickening of cecal wall and proximal ascending
colonic wall. Subtle mild stranding of pericecal fat. Findings are
consistent with residual mild colitis with improvement from prior
exam. There is no evidence of pericolonic abscess or perforation.
Distal colon is unremarkable. No distal colonic obstruction.

Vascular/Lymphatic: No aortic aneurysm. No retroperitoneal or
mesenteric adenopathy.

Reproductive: Prostate gland and seminal vesicles are unremarkable.

Other: Persistent calcification in posterior pelvis measures 1 cm
probable calcified lymph node. No ascites or free abdominal air.
Bilateral inguinal scrotal canal hernia containing fat without
evidence of acute complication.

Musculoskeletal: No destructive bony lesions are noted. Sagittal
images of the spine shows mild degenerative changes lower thoracic
spine. Posterior fusion noted L3-L4 level. The alignment is
preserved.
IMPRESSION: 1. There is persistent mild thickening of the wall in cecum and
proximal right colon. Findings consistent with improving mild
colitis. Normal appendix. The terminal ileum is unremarkable. No
distal colonic obstruction.
2. No hydronephrosis or hydroureter.
3. Mild fatty infiltration of the liver.
4. Postsurgical changes lumbar spine with fusion at L3-L4 level.
5. No small bowel obstruction.
6. Nonspecific mild thickening of urinary bladder wall. Mild
cystitis cannot be excluded.

## 2017-09-28 ENCOUNTER — Other Ambulatory Visit: Payer: Self-pay | Admitting: Family

## 2017-09-28 DIAGNOSIS — K219 Gastro-esophageal reflux disease without esophagitis: Secondary | ICD-10-CM

## 2017-09-28 DIAGNOSIS — G4700 Insomnia, unspecified: Secondary | ICD-10-CM

## 2017-09-28 NOTE — Telephone Encounter (Signed)
Last seen 08/22/17  Charles Pennington

## 2017-10-27 ENCOUNTER — Other Ambulatory Visit: Payer: Self-pay | Admitting: Family

## 2017-10-27 DIAGNOSIS — G4700 Insomnia, unspecified: Secondary | ICD-10-CM

## 2017-10-27 NOTE — Telephone Encounter (Signed)
Last seen 09/01/17

## 2017-11-03 ENCOUNTER — Other Ambulatory Visit: Payer: Self-pay | Admitting: Family

## 2017-11-21 ENCOUNTER — Encounter (HOSPITAL_COMMUNITY): Payer: Self-pay | Admitting: Physical Therapy

## 2017-11-21 NOTE — Therapy (Signed)
[facility] [place], [place], [address] Phone: [phone]   Fax:  [phone]  Patient Details  Name: Charles Pennington MRN: [id number] Date of Birth: [birth year] Referring Provider:  No ref. provider found  Encounter Date: 11/21/2017  PHYSICAL THERAPY DISCHARGE SUMMARY  Visits from Start of Care: 10  Current functional level related to goals / functional outcomes: DC to home health and wound vac    Remaining deficits: Non healing wound    Education / Equipment: DC to wound vac  Plan: Patient agrees to discharge.  Patient goals were not met. Patient is being discharged due to the patient's request.  ?????       Deniece Ree PT, DPT, CBIS  Supplemental Physical Therapist [facility]    Pager [phone] Acute Rehab Office [place], [place], [address] Phone: [phone]   Fax:  [phone]

## 2017-11-28 ENCOUNTER — Other Ambulatory Visit: Payer: Self-pay | Admitting: Family

## 2017-11-28 NOTE — Telephone Encounter (Signed)
Last seen 09/01/17

## 2017-11-29 ENCOUNTER — Encounter (HOSPITAL_COMMUNITY): Payer: Self-pay | Admitting: *Deleted

## 2017-11-29 ENCOUNTER — Other Ambulatory Visit: Payer: Self-pay

## 2017-11-29 ENCOUNTER — Emergency Department (HOSPITAL_COMMUNITY): Payer: Medicare Other

## 2017-11-29 ENCOUNTER — Encounter: Payer: Self-pay | Admitting: Gastroenterology

## 2017-11-29 ENCOUNTER — Emergency Department (HOSPITAL_COMMUNITY)
Admission: EM | Admit: 2017-11-29 | Discharge: 2017-11-29 | Disposition: A | Discharge status: Home / Self Care | Payer: Medicare Other | Attending: Emergency Medicine | Admitting: Emergency Medicine

## 2017-11-29 DIAGNOSIS — J45909 Unspecified asthma, uncomplicated: Secondary | ICD-10-CM | POA: Diagnosis not present

## 2017-11-29 DIAGNOSIS — E039 Hypothyroidism, unspecified: Secondary | ICD-10-CM | POA: Diagnosis not present

## 2017-11-29 DIAGNOSIS — M545 Low back pain: Secondary | ICD-10-CM | POA: Diagnosis not present

## 2017-11-29 DIAGNOSIS — Z79899 Other long term (current) drug therapy: Secondary | ICD-10-CM | POA: Insufficient documentation

## 2017-11-29 DIAGNOSIS — E119 Type 2 diabetes mellitus without complications: Secondary | ICD-10-CM | POA: Diagnosis not present

## 2017-11-29 DIAGNOSIS — F17210 Nicotine dependence, cigarettes, uncomplicated: Secondary | ICD-10-CM | POA: Diagnosis not present

## 2017-11-29 DIAGNOSIS — I10 Essential (primary) hypertension: Secondary | ICD-10-CM | POA: Insufficient documentation

## 2017-11-29 DIAGNOSIS — Z8673 Personal history of transient ischemic attack (TIA), and cerebral infarction without residual deficits: Secondary | ICD-10-CM | POA: Diagnosis not present

## 2017-11-29 DIAGNOSIS — M5431 Sciatica, right side: Secondary | ICD-10-CM | POA: Insufficient documentation

## 2017-11-29 DIAGNOSIS — M5441 Lumbago with sciatica, right side: Secondary | ICD-10-CM | POA: Diagnosis not present

## 2017-11-29 DIAGNOSIS — Z7982 Long term (current) use of aspirin: Secondary | ICD-10-CM | POA: Insufficient documentation

## 2017-11-29 MED ORDER — KETOROLAC TROMETHAMINE 30 MG/ML IJ SOLN
30.0000 mg | Freq: Once | INTRAMUSCULAR | Status: AC
  Administered 2017-11-29: 30 mg via INTRAMUSCULAR
  Filled 2017-11-29: qty 1

## 2017-11-29 MED ORDER — METHOCARBAMOL 500 MG PO TABS: 500.0000 mg | ORAL_TABLET | Freq: Two times a day (BID) | ORAL | 0 refills | Status: DC

## 2017-11-29 MED ORDER — OXYCODONE-ACETAMINOPHEN 5-325 MG PO TABS
1.0000 | ORAL_TABLET | Freq: Once | ORAL | Status: AC
  Administered 2017-11-29: 1 via ORAL
  Filled 2017-11-29: qty 1

## 2017-11-29 NOTE — Discharge Instructions (Addendum)
Please read attached information. If you experience any new or worsening signs or symptoms please return to the emergency room for evaluation. Please follow-up with your primary care provider or specialist as discussed. Please use medication prescribed only as directed and discontinue taking if you have any concerning signs or symptoms.   °

## 2017-11-29 NOTE — ED Notes (Signed)
Pt c/o right sided lower back pain radiating into right leg. Pt. States that he has been hauling wood on Friday and fishing on Saturday. Pt able to move in room from sitting to standing.

## 2017-11-29 NOTE — ED Provider Notes (Signed)
[facility] EMERGENCY DEPARTMENT Provider Note   CSN: [id number] Arrival date & time: 11/29/17  1411     History   Chief Complaint Chief Complaint  Patient presents with  . Back Pain    HPI Charles Pennington is a 52 y.o. male.  HPI   52 year old male presents today with complaints of low back pain.  Patient notes that 4 days ago he was fishing when he developed low back pain.  He notes this worsened with radiation of pain down his gluteus posterior leg and down into the calf.  He notes symptoms are worse with ambulation, he denies any loss of sensation strength or motor function.  He denies any abdominal pain fever IV drug use or any red flags.  Patient denies any trauma to his back but notes that the day prior to symptom onset he was hauling firewood.  Patient does note history of back surgeries previously, notes that the symptoms feel different in nature.  He notes he uses Lyrica daily for neuropathy which has not improved his symptoms.   Past Medical History:  Diagnosis Date  . Anxiety   . Asthma   . Diabetes mellitus   . Diabetes mellitus without complication ([place])   . Hypercholesteremia   . Hypertension   . Hypothyroidism   . Low back pain   . Obesity   . SOB (shortness of breath)   . Stroke [facility]) age 18  . Stroke ([place])   . Vertigo     Patient Active Problem List   Diagnosis Date Noted  . Noncompliance with medications 01/21/2017  . Acute bronchitis with COPD ([place]) 11/05/2016  . Duodenal ulcer 07/23/2016  . Multiple lacerations 04/04/2016  . Cocaine abuse ([place]) 12/12/2015  . Pain medication agreement broken 12/12/2015  . Insomnia 10/28/2015  . Pain medication agreement signed 07/23/2015  . Opioid dependence ([place]) 07/23/2015  . Morbid obesity ([place]) 06/26/2015  . Chronic back pain 06/23/2015  . Rupture of ulnar collateral ligament of thumb 03/01/2014  . Memory difficulties 08/15/2013  . Diabetic neuropathy associated with type 2 diabetes  mellitus ([place]) 08/15/2013  . GERD (gastroesophageal reflux disease) 07/19/2013  . GAD (generalized anxiety disorder) 07/19/2013  . Depression 07/19/2013  . 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 Postlaminectomy syndrome, lumbar region 07/05/2011  . Hyperlipidemia associated with type 2 diabetes mellitus ([place]) 12/30/2009  . SMOKER 12/30/2009  . DEEP VEIN THROMBOSIS/PHLEBITIS 12/30/2009  . Colitis, acute 05/27/2008  . Hypothyroidism 05/23/2008  . Type 2 diabetes mellitus treated with insulin ([place]) 05/23/2008  . Hypertension associated with diabetes ([place]) 05/23/2008  . History of CVA (cerebrovascular accident) 05/23/2008  . Asthma 05/23/2008  . FATTY LIVER DISEASE 05/23/2008  . ABDOMINAL PAIN 05/23/2008    Past Surgical History:  Procedure Laterality Date  . BACK SURGERY  2013  . COLONOSCOPY  2009   Inflammatory changes of the cecum and ascending colon most consistent with infectious etiology, NSAID, ischemia. Suspected resolving infection based on symptomatology.  [facility] FOOT SURGERY     Dr Irving Shows  . SHOULDER ARTHROSCOPY Right   . SPINE SURGERY    . WRIST SURGERY          Home Medications    Prior to Admission medications   Medication Sig Start Date End Date Taking? Authorizing Provider  albuterol (PROVENTIL HFA;VENTOLIN HFA) 108 (90 Base) MCG/ACT inhaler 2 PUFFS INTO THE LUNGS EVERY 6 (SIX) HOURS AS NEEDED FOR WHEEZING. 01/16/15   [provider]  amLODipine ([place])  5 MG tablet TAKE 1 TABLET (5 MG TOTAL) BY MOUTH DAILY. 07/12/17   Evelina Dun A, FNP  aspirin EC 81 MG tablet Take 1 tablet (81 mg total) by mouth daily. 05/05/16   Sharion Balloon, FNP  blood glucose meter kit and supplies Dispense based on patient and insurance preference. Test sugar BID and as needed 09/02/16   Sharion Balloon, FNP  CONTOUR NEXT TEST test strip CHECK BLOOD SUGAR UP TO TWICE DAILY 09/23/17   Evelina Dun A, FNP  fenofibrate 160 MG tablet TAKE 1 TABLET (160 MG TOTAL)  BY MOUTH DAILY. 09/29/17   Evelina Dun A, FNP  Fluticasone-Umeclidin-Vilant (TRELEGY ELLIPTA) 100-62.5-25 MCG/INH AEPB Inhale 1 puff into the lungs daily. 11/05/16   Evelina Dun A, FNP  Insulin Degludec (TRESIBA FLEXTOUCH) 200 UNIT/ML SOPN Inject 80-90 Units into the skin daily. (Needs to be seen) 11/04/17   Sharion Balloon, FNP  levothyroxine (SYNTHROID, LEVOTHROID) 175 MCG tablet TAKE (1) TABLET DAILY BE- FORE BREAKFAST. 09/29/17   Evelina Dun A, FNP  lisinopril (PRINIVIL,ZESTRIL) 20 MG tablet Take 20 mg by mouth daily.    [provider]  methocarbamol (ROBAXIN) 500 MG tablet Take 1 tablet (500 mg total) by mouth 2 (two) times daily. 11/29/17   Japhet Morgenthaler, Dellis Filbert, PA-C  mupirocin ointment (BACTROBAN) 2 % APPLY TO AFFECTED AREAS TWICE A DAY 11/29/17   [name], [name], FNP  ofloxacin (FLOXIN OTIC) 0.3 % OTIC solution Place 5 drops into the left ear daily. 09/01/17   Sharion Balloon, FNP  omeprazole (PRILOSEC) 20 MG capsule TAKE (1) CAPSULE DAILY 09/29/17   Evelina Dun A, FNP  PARoxetine (PAXIL) 40 MG tablet TAKE 1 TABLET ONCE A DAY 09/06/17   Evelina Dun A, FNP  PARoxetine (PAXIL) 40 MG tablet TAKE 1 TABLET ONCE A DAY 11/28/17   [name], [name], FNP  pregabalin (LYRICA) 150 MG capsule TAKE 2 CAPSULES EVERY MORNING AND 2 CAPSULES EVERY EVENING 10/27/17   [name], Alyse Low A, FNP  TRULICITY 1.5 ZO/1.0RU SOPN INJECT 1.5 MG SUB-Q ONCE A WEEK 11/29/17   [name], Dale A, FNP  ULTICARE MICRO PEN NEEDLES 32G X 4 MM MISC Use 2 times a day To test blood glucose 09/06/17   Sharion Balloon, FNP  VENTOLIN HFA 108 (90 Base) MCG/ACT inhaler 2 PUFFS EVERY 6 HOURS AS NEEDED FOR WHEEZING 11/28/17   Sharion Balloon, FNP  zolpidem (AMBIEN) 10 MG tablet TAKE ONE TABLET AT BEDTIME 10/27/17   Sharion Balloon, FNP    Family History Family History  Problem Relation Age of Onset  . Hypertension Father   . Suicidality Father   . Diabetes Father   . Diabetes Mother   . Colon cancer Neg Hx   .  Inflammatory bowel disease Neg Hx     Social History Social History   Tobacco Use  . Smoking status: Current Every Day Smoker    Packs/day: 0.50    Years: 30.00    Pack years: 15.00    Types: Cigarettes  . Smokeless tobacco: Never Used  Substance Use Topics  . Alcohol use: No    Comment: occasional per pt  . Drug use: No     Allergies   Hydrocodone-acetaminophen   Review of Systems Review of Systems  All other systems reviewed and are negative.   Physical Exam Updated Vital Signs BP 115/89 (BP Location: Left Arm)   Pulse 95   Temp 98 F (36.7 C) (Oral)   Resp 18   SpO2  95%   Physical Exam  Constitutional: He is oriented to person, place, and time. He appears well-developed and well-nourished.  HENT:  Head: Normocephalic and atraumatic.  Eyes: Pupils are equal, round, and reactive to light. Conjunctivae are normal. Right eye exhibits no discharge. Left eye exhibits no discharge. No scleral icterus.  Neck: Normal range of motion. No JVD present. No tracheal deviation present.  Pulmonary/Chest: Effort normal. No stridor.  Musculoskeletal:  Minor tenderness palpation the right lateral lumbar musculature and gluteus, straight leg positive, distal sensation strength and motor function intact, patellar reflexes 2+ bilateral  Neurological: He is alert and oriented to person, place, and time. Coordination normal.  Psychiatric: He has a normal mood and affect. His behavior is normal. Judgment and thought content normal.  Nursing note and vitals reviewed.    ED Treatments / Results  Labs (all labs ordered are listed, but only abnormal results are displayed) Labs Reviewed - No data to display  EKG None  Radiology Dg Lumbar Spine Complete  Result Date: 11/29/2017 CLINICAL DATA:  Low back pain radiating into the right leg, no known injury, initial encounter EXAM: LUMBAR SPINE - COMPLETE 4+ VIEW COMPARISON:  None. FINDINGS: Five lumbar type vertebral bodies are well  visualized. Changes of prior fusion at L3-4 with posterior fixation are seen. Multilevel osteophytic changes are noted. No compression deformities are seen. Facet hypertrophic changes are noted. No anterolisthesis is noted. IMPRESSION: Degenerative and postsurgical changes.  No acute abnormality noted. Electronically Signed   By: Inez Catalina M.D.   On: 11/29/2017 16:54    Procedures Procedures (including critical care time)  Medications Ordered in ED Medications  ketorolac (TORADOL) 30 MG/ML injection 30 mg (30 mg Intramuscular Given 11/29/17 1630)  oxyCODONE-acetaminophen (PERCOCET/ROXICET) 5-325 MG per tablet 1 tablet (1 tablet Oral Given 11/29/17 1732)     Initial Impression / Assessment and Plan / ED Course  I have reviewed the triage vital signs and the nursing notes.  Pertinent labs & imaging results that were available during my care of the patient were reviewed by me and considered in my medical decision making (see chart for details).     Labs:   Imaging: DG lumbar spine  Consults:  Therapeutics: Toradol, Percocet  Discharge Meds: Biaxin  Assessment/Plan: 52 year old male presents today with likely sciatica.  He has no red flags on exam, treated here symptomatically, and referred to neurosurgery as an outpatient, strict return precautions given, patient verbalized understanding and agreement to today's plan had no further questions or concerns the time discharge.   Final Clinical Impressions(s) / ED Diagnoses   Final diagnoses:  Sciatica of right side    ED Discharge Orders         Ordered    methocarbamol (ROBAXIN) 500 MG tablet  2 times daily     11/29/17 1718           HedgesDellis Filbert, PA-C 11/29/17 2146    Lennice Sites, DO 11/29/17 2208

## 2017-11-29 NOTE — ED Triage Notes (Signed)
Pt in c/o R sided lower back pain that radiates R leg, pt reports hx of back surgery, pt ambulatory with pain, pt MAE, A&O x4

## 2017-11-29 NOTE — Telephone Encounter (Signed)
Last seen 09/01/17

## 2017-12-01 ENCOUNTER — Other Ambulatory Visit: Payer: Self-pay | Admitting: Family

## 2017-12-02 NOTE — Telephone Encounter (Signed)
Last seen 09/01/17

## 2017-12-13 ENCOUNTER — Other Ambulatory Visit: Payer: Self-pay | Admitting: Family

## 2017-12-19 ENCOUNTER — Ambulatory Visit (INDEPENDENT_AMBULATORY_CARE_PROVIDER_SITE_OTHER): Payer: Medicare Other | Admitting: Family

## 2017-12-19 ENCOUNTER — Ambulatory Visit (INDEPENDENT_AMBULATORY_CARE_PROVIDER_SITE_OTHER): Payer: Medicare Other

## 2017-12-19 ENCOUNTER — Encounter: Payer: Self-pay | Admitting: Family

## 2017-12-19 ENCOUNTER — Encounter: Payer: Self-pay | Admitting: *Deleted

## 2017-12-19 VITALS — BP 131/85 | HR 98 | Temp 97.8°F | Ht 68.0 in | Wt 233.8 lb

## 2017-12-19 DIAGNOSIS — F411 Generalized anxiety disorder: Secondary | ICD-10-CM | POA: Diagnosis not present

## 2017-12-19 DIAGNOSIS — Z794 Long term (current) use of insulin: Secondary | ICD-10-CM | POA: Diagnosis not present

## 2017-12-19 DIAGNOSIS — E1142 Type 2 diabetes mellitus with diabetic polyneuropathy: Secondary | ICD-10-CM

## 2017-12-19 DIAGNOSIS — J209 Acute bronchitis, unspecified: Secondary | ICD-10-CM

## 2017-12-19 DIAGNOSIS — R062 Wheezing: Secondary | ICD-10-CM

## 2017-12-19 DIAGNOSIS — Z1212 Encounter for screening for malignant neoplasm of rectum: Secondary | ICD-10-CM

## 2017-12-19 DIAGNOSIS — E785 Hyperlipidemia, unspecified: Secondary | ICD-10-CM | POA: Diagnosis not present

## 2017-12-19 DIAGNOSIS — I10 Essential (primary) hypertension: Secondary | ICD-10-CM | POA: Diagnosis not present

## 2017-12-19 DIAGNOSIS — Z9114 Patient's other noncompliance with medication regimen: Secondary | ICD-10-CM

## 2017-12-19 DIAGNOSIS — F1120 Opioid dependence, uncomplicated: Secondary | ICD-10-CM

## 2017-12-19 DIAGNOSIS — E119 Type 2 diabetes mellitus without complications: Secondary | ICD-10-CM | POA: Diagnosis not present

## 2017-12-19 DIAGNOSIS — Z8673 Personal history of transient ischemic attack (TIA), and cerebral infarction without residual deficits: Secondary | ICD-10-CM

## 2017-12-19 DIAGNOSIS — K219 Gastro-esophageal reflux disease without esophagitis: Secondary | ICD-10-CM

## 2017-12-19 DIAGNOSIS — J440 Chronic obstructive pulmonary disease with acute lower respiratory infection: Secondary | ICD-10-CM

## 2017-12-19 DIAGNOSIS — E039 Hypothyroidism, unspecified: Secondary | ICD-10-CM | POA: Diagnosis not present

## 2017-12-19 DIAGNOSIS — F17200 Nicotine dependence, unspecified, uncomplicated: Secondary | ICD-10-CM

## 2017-12-19 DIAGNOSIS — Z1211 Encounter for screening for malignant neoplasm of colon: Secondary | ICD-10-CM

## 2017-12-19 DIAGNOSIS — E1169 Type 2 diabetes mellitus with other specified complication: Secondary | ICD-10-CM | POA: Diagnosis not present

## 2017-12-19 DIAGNOSIS — R0600 Dyspnea, unspecified: Secondary | ICD-10-CM | POA: Diagnosis not present

## 2017-12-19 DIAGNOSIS — M5440 Lumbago with sciatica, unspecified side: Secondary | ICD-10-CM

## 2017-12-19 DIAGNOSIS — J4520 Mild intermittent asthma, uncomplicated: Secondary | ICD-10-CM | POA: Diagnosis not present

## 2017-12-19 DIAGNOSIS — E1159 Type 2 diabetes mellitus with other circulatory complications: Secondary | ICD-10-CM | POA: Diagnosis not present

## 2017-12-19 DIAGNOSIS — G8929 Other chronic pain: Secondary | ICD-10-CM

## 2017-12-19 DIAGNOSIS — F331 Major depressive disorder, recurrent, moderate: Secondary | ICD-10-CM

## 2017-12-19 DIAGNOSIS — Z91148 Patient's other noncompliance with medication regimen for other reason: Secondary | ICD-10-CM

## 2017-12-19 LAB — BAYER DCA HB A1C WAIVED: HB A1C (BAYER DCA - WAIVED): >14 % — ABNORMAL HIGH (ref <7.0)

## 2017-12-19 MED ORDER — PREDNISONE 10 MG (21) PO TBPK: ORAL_TABLET | ORAL | 0 refills | Status: DC

## 2017-12-19 NOTE — Patient Instructions (Signed)

## 2017-12-19 NOTE — Progress Notes (Signed)
Subjective:    Patient ID: Charles Pennington, male    DOB: 1965-10-11, 52 y.o.   MRN: [id number]  Chief Complaint  Patient presents with  . Medical Management of Chronic Issues   Pt presents to the office today for chronic follow up.Pt had a CVA when he turned 31 years. PT states he continue to have "balance issues related to the CVA". PT states this is stable.   Pt is noncompliant with his medications and takes "most of his pills most days".   He went to the ED for chronic back pain and sciatica on 11/29/17. He requesting referral to pain clinic.  Diabetes  He presents for his follow-up diabetic visit. He has type 2 diabetes mellitus. His disease course has been worsening. Hypoglycemia symptoms include nervousness/anxiousness. Associated symptoms include blurred vision, fatigue and foot paresthesias. Pertinent negatives for diabetes include no visual change. Symptoms are worsening. Diabetic complications include a CVA, heart disease and peripheral neuropathy. Risk factors for coronary artery disease include diabetes mellitus, dyslipidemia, male sex, hypertension and sedentary lifestyle. He is following a generally unhealthy diet. His overall blood glucose range is 110-130 mg/dl. Eye exam is current.  Hyperlipidemia  This is a chronic problem. The current episode started more than 1 year ago. The problem is controlled. Recent lipid tests were reviewed and are normal. Exacerbating diseases include obesity. Associated symptoms include leg pain and shortness of breath. Current antihyperlipidemic treatment includes statins. The current treatment provides moderate improvement of lipids. Risk factors for coronary artery disease include dyslipidemia, diabetes mellitus, male sex, hypertension and a sedentary lifestyle.  Hypertension  This is a chronic problem. The current episode started more than 1 year ago. The problem has been resolved since onset. The problem is controlled. Associated symptoms  include anxiety, blurred vision and shortness of breath. Pertinent negatives include no malaise/fatigue or peripheral edema. Risk factors for coronary artery disease include diabetes mellitus, dyslipidemia, obesity and male gender. The current treatment provides moderate improvement. Hypertensive end-organ damage includes CAD/MI and CVA. There is no history of heart failure. Identifiable causes of hypertension include a thyroid problem.  Gastroesophageal Reflux  He complains of coughing, a hoarse voice and wheezing. He reports no belching or no heartburn. This is a chronic problem. The current episode started more than 1 year ago. The problem occurs occasionally. The problem has been waxing and waning. Associated symptoms include fatigue. He has tried a PPI for the symptoms. The treatment provided moderate relief.  Asthma  He complains of cough, frequent throat clearing, hoarse voice, shortness of breath and wheezing. This is a chronic problem. The current episode started more than 1 year ago. The problem occurs intermittently. The problem has been waxing and waning. Pertinent negatives include no heartburn or malaise/fatigue. His symptoms are aggravated by exercise and change in weather. His symptoms are alleviated by rest. He reports moderate improvement on treatment. His past medical history is significant for asthma and COPD.  Back Pain  This is a chronic problem. The current episode started more than 1 year ago. The problem occurs intermittently. The problem has been waxing and waning since onset. The pain is present in the gluteal and lumbar spine. The pain radiates to the right foot and right thigh. The pain is at a severity of 10/10. The pain is moderate. Associated symptoms include leg pain, numbness and tingling. Risk factors include recent trauma. He has tried analgesics and muscle relaxant for the symptoms. The treatment provided mild relief.  Depression  This is a chronic problem.  The  current episode started more than 1 year ago.   The onset quality is sudden.   The problem occurs intermittently.  Associated symptoms include decreased concentration, fatigue, helplessness, hopelessness, irritable, restlessness and sad.  Past medical history includes thyroid problem and anxiety.   Anxiety  Presents for follow-up visit. Symptoms include decreased concentration, excessive worry, irritability, nervous/anxious behavior, restlessness and shortness of breath. Symptoms occur most days. The severity of symptoms is moderate. The quality of sleep is good.   His past medical history is significant for asthma.  Thyroid Problem  Presents for follow-up visit. Symptoms include anxiety, fatigue and hoarse voice. Patient reports no visual change. The symptoms have been stable. His past medical history is significant for hyperlipidemia. There is no history of heart failure.      Review of Systems  Constitutional: Positive for fatigue and irritability. Negative for malaise/fatigue.  HENT: Positive for hoarse voice.   Eyes: Positive for blurred vision.  Respiratory: Positive for cough, shortness of breath and wheezing.   Gastrointestinal: Negative for heartburn.  Musculoskeletal: Positive for back pain.  Neurological: Positive for tingling and numbness.  Psychiatric/Behavioral: Positive for decreased concentration and depression. The patient is nervous/anxious.   All other systems reviewed and are negative.      Objective:   Physical Exam  Constitutional: He is oriented to person, place, and time. He appears well-developed and well-nourished. [name]. No distress.  HENT:  Head: Normocephalic.  Right Ear: External ear normal.  Left Ear: External ear normal.  Mouth/Throat: Oropharynx is clear and moist.  Eyes: Pupils are equal, round, and reactive to light. Right eye exhibits no discharge. Left eye exhibits no discharge.  Neck: Normal range of motion. Neck supple. No thyromegaly  present.  Cardiovascular: Normal rate, regular rhythm, normal heart sounds and intact distal pulses.  No murmur heard. Pulmonary/Chest: Effort normal. No respiratory distress. He has wheezes. He has rhonchi.  Abdominal: Soft. Bowel sounds are normal. He exhibits no distension. There is no tenderness.  Musculoskeletal: Normal range of motion. He exhibits no edema or tenderness.  Neurological: He is alert and oriented to person, place, and time. He has normal reflexes. No cranial nerve deficit.  Skin: Skin is warm and dry. No rash noted. There is erythema (dry, patchy erythemas on face).  Psychiatric: He has a normal mood and affect. Judgment and thought content normal. He is hyperactive.  Vitals reviewed.     BP 131/85   Pulse 98   Temp 97.8 F (36.6 C) (Oral)   Ht 5' 8"  (1.727 m)   Wt 233 lb 12.8 oz (106.1 kg)   BMI 35.55 kg/m      Assessment & Plan:  EMERY DUPUY comes in today with chief complaint of Medical Management of Chronic Issues   Diagnosis and orders addressed:  1. Hypertension associated with diabetes ([place]) - CMP14+EGFR - CBC with Differential/Platelet  2. Intermittent asthma without complication, unspecified asthma severity - CMP14+EGFR - CBC with Differential/Platelet - DG Chest 2 View; Future  3. Gastroesophageal reflux disease, esophagitis presence not specified - CMP14+EGFR - CBC with Differential/Platelet  4. Hypothyroidism, unspecified type - CMP14+EGFR - CBC with Differential/Platelet - TSH  5. Type 2 diabetes mellitus treated with insulin (HCC) - CMP14+EGFR - CBC with Differential/Platelet - Microalbumin / creatinine urine ratio - Bayer DCA Hb A1c Waived  6. Hyperlipidemia associated with type 2 diabetes mellitus (HCC) - CMP14+EGFR - CBC with Differential/Platelet - Lipid panel  7.  Diabetic polyneuropathy associated with type 2 diabetes mellitus (HCC) - CMP14+EGFR - CBC with Differential/Platelet  8. SMOKER Smoking  cessation - CMP14+EGFR - CBC with Differential/Platelet  9. History of CVA (cerebrovascular accident) - CMP14+EGFR - CBC with Differential/Platelet  10. GAD (generalized anxiety disorder)  - CMP14+EGFR - CBC with Differential/Platelet  11. Moderate episode of recurrent major depressive disorder (HCC) - CMP14+EGFR - CBC with Differential/Platelet  12. Noncompliance with medications - CMP14+EGFR - CBC with Differential/Platelet  13. Morbid obesity ([place]) - CMP14+EGFR - CBC with Differential/Platelet - Ambulatory referral to Pain Clinic  14. Chronic low back pain with sciatica, sciatica laterality unspecified, unspecified back pain laterality - CMP14+EGFR - CBC with Differential/Platelet - Ambulatory referral to Pain Clinic  15. Uncomplicated opioid dependence ([place]) - CMP14+EGFR - CBC with Differential/Platelet  16. Acute bronchitis with COPD ([place]) -Start prednisone today, strict low carb diet - CMP14+EGFR - CBC with Differential/Platelet - DG Chest 2 View; Future - predniSONE (STERAPRED UNI-PAK 21 TAB) 10 MG (21) TBPK tablet; Use as directed  Dispense: 21 tablet; Refill: 0  17. Colon cancer screening - Cologuard  18. Screening for malignant neoplasm of the rectum - Cologuard  19. Wheezing - DG Chest 2 View; Future - predniSONE (STERAPRED UNI-PAK 21 TAB) 10 MG (21) TBPK tablet; Use as directed  Dispense: 21 tablet; Refill: 0   Labs pending Health Maintenance reviewed Diet and exercise encouraged  Follow up plan: 1 month   Evelina Dun, FNP

## 2017-12-20 ENCOUNTER — Other Ambulatory Visit: Payer: Self-pay | Admitting: Family

## 2017-12-20 LAB — CBC WITH DIFFERENTIAL/PLATELET
BASOS: 1 %
Basophils Absolute: 0.1 10*3/uL (ref 0.0–0.2)
EOS (ABSOLUTE): 0.3 10*3/uL (ref 0.0–0.4)
Eos: 4 %
Hematocrit: 45.8 % (ref 37.5–51.0)
Hemoglobin: 15.9 g/dL (ref 13.0–17.7)
IMMATURE GRANULOCYTES: 0 %
Immature Grans (Abs): 0 10*3/uL (ref 0.0–0.1)
Lymphocytes Absolute: 2.3 10*3/uL (ref 0.7–3.1)
Lymphs: 32 %
MCH: 32.5 pg (ref 26.6–33.0)
MCHC: 34.7 g/dL (ref 31.5–35.7)
MCV: 94 fL (ref 79–97)
MONOS ABS: 0.5 10*3/uL (ref 0.1–0.9)
Monocytes: 7 %
NEUTROS PCT: 56 %
Neutrophils Absolute: 4 10*3/uL (ref 1.4–7.0)
PLATELETS: 191 10*3/uL (ref 150–450)
RBC: 4.89 x10E6/uL (ref 4.14–5.80)
RDW: 11.8 % — AB (ref 12.3–15.4)
WBC: 7.2 10*3/uL (ref 3.4–10.8)

## 2017-12-20 LAB — LIPID PANEL
Chol/HDL Ratio: 3.2 ratio (ref 0.0–5.0)
Cholesterol, Total: 133 mg/dL (ref 100–199)
HDL: 42 mg/dL (ref >39)
LDL Calculated: 67 mg/dL (ref 0–99)
Triglycerides: 119 mg/dL (ref 0–149)
VLDL Cholesterol Cal: 24 mg/dL (ref 5–40)

## 2017-12-20 LAB — CMP14+EGFR
A/G RATIO: 2.2 (ref 1.2–2.2)
ALT: 19 IU/L (ref 0–44)
AST: 21 IU/L (ref 0–40)
Albumin: 4.4 g/dL (ref 3.5–5.5)
Alkaline Phosphatase: 85 IU/L (ref 39–117)
BILIRUBIN TOTAL: 0.3 mg/dL (ref 0.0–1.2)
BUN/Creatinine Ratio: 17 (ref 9–20)
BUN: 15 mg/dL (ref 6–24)
CALCIUM: 9.4 mg/dL (ref 8.7–10.2)
CHLORIDE: 90 mmol/L — AB (ref 96–106)
CO2: 27 mmol/L (ref 20–29)
Creatinine, Ser: 0.9 mg/dL (ref 0.76–1.27)
GFR calc Af Amer: 114 mL/min/{1.73_m2} (ref >59)
GFR, EST NON AFRICAN AMERICAN: 99 mL/min/{1.73_m2} (ref >59)
GLOBULIN, TOTAL: 2 g/dL (ref 1.5–4.5)
Glucose: 448 mg/dL (ref 65–99)
POTASSIUM: 3.9 mmol/L (ref 3.5–5.2)
SODIUM: 132 mmol/L — AB (ref 134–144)
Total Protein: 6.4 g/dL (ref 6.0–8.5)

## 2017-12-20 LAB — MICROALBUMIN / CREATININE URINE RATIO
CREATININE, UR: 38.5 mg/dL
Microalb/Creat Ratio: <7.8 mg/g creat (ref 0.0–30.0)

## 2017-12-20 LAB — TSH: TSH: 5.04 u[IU]/mL — ABNORMAL HIGH (ref 0.450–4.500)

## 2017-12-20 MED ORDER — DULAGLUTIDE 1.5 MG/0.5ML ~~LOC~~ SOAJ: SUBCUTANEOUS | 0 refills | Status: DC

## 2017-12-20 MED ORDER — INSULIN DEGLUDEC 200 UNIT/ML ~~LOC~~ SOPN: 80.0000 [IU] | PEN_INJECTOR | Freq: Every day | SUBCUTANEOUS | 0 refills | Status: DC

## 2017-12-20 MED ORDER — METFORMIN HCL ER 750 MG PO TB24: 1500.0000 mg | ORAL_TABLET | Freq: Every day | ORAL | 11 refills | Status: DC

## 2017-12-22 DIAGNOSIS — Z79899 Other long term (current) drug therapy: Secondary | ICD-10-CM | POA: Diagnosis not present

## 2017-12-22 DIAGNOSIS — M129 Arthropathy, unspecified: Secondary | ICD-10-CM | POA: Diagnosis not present

## 2017-12-22 DIAGNOSIS — M545 Low back pain: Secondary | ICD-10-CM | POA: Diagnosis not present

## 2017-12-22 DIAGNOSIS — G8929 Other chronic pain: Secondary | ICD-10-CM | POA: Diagnosis not present

## 2017-12-28 ENCOUNTER — Telehealth: Payer: Self-pay | Admitting: Family

## 2017-12-28 ENCOUNTER — Other Ambulatory Visit: Payer: Self-pay | Admitting: Family

## 2017-12-28 DIAGNOSIS — G4700 Insomnia, unspecified: Secondary | ICD-10-CM

## 2017-12-28 DIAGNOSIS — K219 Gastro-esophageal reflux disease without esophagitis: Secondary | ICD-10-CM

## 2017-12-29 ENCOUNTER — Ambulatory Visit (INDEPENDENT_AMBULATORY_CARE_PROVIDER_SITE_OTHER): Payer: Medicare Other | Admitting: Family

## 2017-12-29 ENCOUNTER — Other Ambulatory Visit: Payer: Self-pay | Admitting: Family

## 2017-12-29 ENCOUNTER — Encounter: Payer: Self-pay | Admitting: Family

## 2017-12-29 VITALS — BP 128/86 | HR 102 | Temp 98.2°F | Ht 68.0 in | Wt 234.8 lb

## 2017-12-29 DIAGNOSIS — G8929 Other chronic pain: Secondary | ICD-10-CM

## 2017-12-29 DIAGNOSIS — J209 Acute bronchitis, unspecified: Secondary | ICD-10-CM

## 2017-12-29 DIAGNOSIS — J440 Chronic obstructive pulmonary disease with acute lower respiratory infection: Secondary | ICD-10-CM

## 2017-12-29 DIAGNOSIS — M5440 Lumbago with sciatica, unspecified side: Secondary | ICD-10-CM

## 2017-12-29 MED ORDER — KETOROLAC TROMETHAMINE 60 MG/2ML IM SOLN
60.0000 mg | Freq: Once | INTRAMUSCULAR | Status: AC
  Administered 2017-12-29: 60 mg via INTRAMUSCULAR

## 2017-12-29 NOTE — Telephone Encounter (Signed)
I can not give any pain medication to patient. He has Lyric that can help with nerve pain. He needs to follow up with Pain Clinic.

## 2017-12-29 NOTE — Telephone Encounter (Signed)
Pt notified we can not give pain meds Requesting something for sciatic pain appt scheduled for evaluation

## 2017-12-29 NOTE — Patient Instructions (Signed)

## 2017-12-29 NOTE — Telephone Encounter (Signed)
Please advise if any medication will be ordered.

## 2017-12-29 NOTE — Progress Notes (Signed)
   Subjective:    Patient ID: Charles Pennington, male    DOB: 03-05-1965, 52 y.o.   MRN: [id number]  Chief Complaint  Patient presents with  . back pain down through buttock and leg    Back Pain  This is a chronic problem. The current episode started more than 1 year ago. The problem occurs intermittently. The problem has been waxing and waning since onset. The pain is present in the lumbar spine. The quality of the pain is described as aching. The pain radiates to the right thigh. The pain is at a severity of 10/10. The pain is moderate. Associated symptoms include leg pain, tingling and weakness. Pertinent negatives include no bladder incontinence or bowel incontinence. Risk factors include obesity, sedentary lifestyle and recent trauma. He has tried muscle relaxant, NSAIDs and bed rest for the symptoms. The treatment provided mild relief.      Review of Systems  Gastrointestinal: Negative for bowel incontinence.  Genitourinary: Negative for bladder incontinence.  Musculoskeletal: Positive for back pain.  Neurological: Positive for tingling and weakness.  All other systems reviewed and are negative.      Objective:   Physical Exam  Constitutional: He is oriented to person, place, and time. He appears well-developed and well-nourished. No distress.  HENT:  Head: Normocephalic.  Right Ear: External ear normal.  Left Ear: External ear normal.  Mouth/Throat: Oropharynx is clear and moist.  Eyes: Pupils are equal, round, and reactive to light. Right eye exhibits no discharge. Left eye exhibits no discharge.  Neck: Normal range of motion. Neck supple. No thyromegaly present.  Cardiovascular: Normal rate, regular rhythm, normal heart sounds and intact distal pulses.  No murmur heard. Pulmonary/Chest: Effort normal. No respiratory distress. He has no wheezes. He has rhonchi.  Abdominal: Soft. Bowel sounds are normal. He exhibits no distension. There is no tenderness.  Musculoskeletal:  He exhibits tenderness. He exhibits no edema.  Pain in lower right lumbar with flexion and extension, unable to perform SLR  Neurological: He is alert and oriented to person, place, and time. He has normal reflexes. No cranial nerve deficit.  Skin: Skin is warm and dry. No rash noted. No erythema. There is pallor.  Psychiatric: He has a normal mood and affect. His behavior is normal. Judgment and thought content normal.  Vitals reviewed.     BP (!) 137/101   Pulse (!) 106   Temp 98.2 F (36.8 C) (Oral)   Ht 5\' 8"  (1.727 m)   Wt 234 lb 12.8 oz (106.5 kg)   BMI 35.70 kg/m      Assessment & Plan:  Charles Pennington comes in today with chief complaint of back pain down through buttock and leg   Diagnosis and orders addressed:  1. Acute bronchitis with COPD (HCC) Will hold off on prednsione today related to Blood glucose in 400's  Continue trelegy  Smoking cessation discussed  2. Chronic low back pain with sciatica, sciatica laterality unspecified, unspecified back pain laterality Keep pain management follow up on 01/05/18 Continue Lyrica and Robaxin Can not prescribe controlled medications because he has broke previous pain contracts in the past - ketorolac (TORADOL) injection 60 mg RTO if symptoms worsen or do not improve    Jannifer Rodneyhristy Clayvon Parlett, FNP

## 2018-01-05 DIAGNOSIS — G894 Chronic pain syndrome: Secondary | ICD-10-CM | POA: Diagnosis not present

## 2018-01-05 DIAGNOSIS — Z79899 Other long term (current) drug therapy: Secondary | ICD-10-CM | POA: Diagnosis not present

## 2018-01-05 DIAGNOSIS — M5136 Other intervertebral disc degeneration, lumbar region: Secondary | ICD-10-CM | POA: Diagnosis not present

## 2018-01-09 ENCOUNTER — Other Ambulatory Visit: Payer: Self-pay | Admitting: Nurse Practitioner

## 2018-01-09 DIAGNOSIS — M79604 Pain in right leg: Secondary | ICD-10-CM

## 2018-01-09 DIAGNOSIS — M545 Low back pain, unspecified: Secondary | ICD-10-CM

## 2018-01-19 ENCOUNTER — Ambulatory Visit: Payer: Medicare Other | Admitting: Family

## 2018-01-21 ENCOUNTER — Other Ambulatory Visit: Payer: Medicare Other

## 2018-01-26 ENCOUNTER — Ambulatory Visit (INDEPENDENT_AMBULATORY_CARE_PROVIDER_SITE_OTHER): Payer: Medicare Other | Admitting: Family

## 2018-01-26 ENCOUNTER — Encounter: Payer: Self-pay | Admitting: Family

## 2018-01-26 VITALS — BP 127/87 | HR 94 | Temp 97.5°F | Ht 68.0 in | Wt 233.6 lb

## 2018-01-26 DIAGNOSIS — E1142 Type 2 diabetes mellitus with diabetic polyneuropathy: Secondary | ICD-10-CM | POA: Diagnosis not present

## 2018-01-26 DIAGNOSIS — E119 Type 2 diabetes mellitus without complications: Secondary | ICD-10-CM

## 2018-01-26 DIAGNOSIS — Z794 Long term (current) use of insulin: Secondary | ICD-10-CM

## 2018-01-26 DIAGNOSIS — Z23 Encounter for immunization: Secondary | ICD-10-CM

## 2018-01-26 DIAGNOSIS — Z9114 Patient's other noncompliance with medication regimen: Secondary | ICD-10-CM

## 2018-01-26 DIAGNOSIS — Z91148 Patient's other noncompliance with medication regimen for other reason: Secondary | ICD-10-CM

## 2018-01-26 NOTE — Patient Instructions (Signed)
Diabetes Mellitus and Nutrition When you have diabetes (diabetes mellitus), it is very important to have healthy eating habits because your blood sugar (glucose) levels are greatly affected by what you eat and drink. Eating healthy foods in the appropriate amounts, at about the same times every day, can help you:  Control your blood glucose.  Lower your risk of heart disease.  Improve your blood pressure.  Reach or maintain a healthy weight.  Every person with diabetes is different, and each person has different needs for a meal plan. Your health care provider may recommend that you work with a diet and nutrition specialist (dietitian) to make a meal plan that is best for you. Your meal plan may vary depending on factors such as:  The calories you need.  The medicines you take.  Your weight.  Your blood glucose, blood pressure, and cholesterol levels.  Your activity level.  Other health conditions you have, such as heart or kidney disease.  How do carbohydrates affect me? Carbohydrates affect your blood glucose level more than any other type of food. Eating carbohydrates naturally increases the amount of glucose in your blood. Carbohydrate counting is a method for keeping track of how many carbohydrates you eat. Counting carbohydrates is important to keep your blood glucose at a healthy level, especially if you use insulin or take certain oral diabetes medicines. It is important to know how many carbohydrates you can safely have in each meal. This is different for every person. Your dietitian can help you calculate how many carbohydrates you should have at each meal and for snack. Foods that contain carbohydrates include:  Bread, cereal, rice, pasta, and crackers.  Potatoes and corn.  Peas, beans, and lentils.  Milk and yogurt.  Fruit and juice.  Desserts, such as cakes, cookies, ice cream, and candy.  How does alcohol affect me? Alcohol can cause a sudden decrease in blood  glucose (hypoglycemia), especially if you use insulin or take certain oral diabetes medicines. Hypoglycemia can be a life-threatening condition. Symptoms of hypoglycemia (sleepiness, dizziness, and confusion) are similar to symptoms of having too much alcohol. If your health care provider says that alcohol is safe for you, follow these guidelines:  Limit alcohol intake to no more than 1 drink per day for nonpregnant women and 2 drinks per day for men. One drink equals 12 oz of beer, 5 oz of wine, or 1 oz of hard liquor.  Do not drink on an empty stomach.  Keep yourself hydrated with water, diet soda, or unsweetened iced tea.  Keep in mind that regular soda, juice, and other mixers may contain a lot of sugar and must be counted as carbohydrates.  What are tips for following this plan? Reading food labels  Start by checking the serving size on the label. The amount of calories, carbohydrates, fats, and other nutrients listed on the label are based on one serving of the food. Many foods contain more than one serving per package.  Check the total grams (g) of carbohydrates in one serving. You can calculate the number of servings of carbohydrates in one serving by dividing the total carbohydrates by 15. For example, if a food has 30 g of total carbohydrates, it would be equal to 2 servings of carbohydrates.  Check the number of grams (g) of saturated and trans fats in one serving. Choose foods that have low or no amount of these fats.  Check the number of milligrams (mg) of sodium in one serving. Most people   should limit total sodium intake to less than 2,300 mg per day.  Always check the nutrition information of foods labeled as "low-fat" or "nonfat". These foods may be higher in added sugar or refined carbohydrates and should be avoided.  Talk to your dietitian to identify your daily goals for nutrients listed on the label. Shopping  Avoid buying canned, premade, or processed foods. These  foods tend to be high in fat, sodium, and added sugar.  Shop around the outside edge of the grocery store. This includes fresh fruits and vegetables, bulk grains, fresh meats, and fresh dairy. Cooking  Use low-heat cooking methods, such as baking, instead of high-heat cooking methods like deep frying.  Cook using healthy oils, such as olive, canola, or sunflower oil.  Avoid cooking with butter, cream, or high-fat meats. Meal planning  Eat meals and snacks regularly, preferably at the same times every day. Avoid going long periods of time without eating.  Eat foods high in fiber, such as fresh fruits, vegetables, beans, and whole grains. Talk to your dietitian about how many servings of carbohydrates you can eat at each meal.  Eat 4-6 ounces of lean protein each day, such as lean meat, chicken, fish, eggs, or tofu. 1 ounce is equal to 1 ounce of meat, chicken, or fish, 1 egg, or 1/4 cup of tofu.  Eat some foods each day that contain healthy fats, such as avocado, nuts, seeds, and fish. Lifestyle   Check your blood glucose regularly.  Exercise at least 30 minutes 5 or more days each week, or as told by your health care provider.  Take medicines as told by your health care provider.  Do not use any products that contain nicotine or tobacco, such as cigarettes and e-cigarettes. If you need help quitting, ask your health care provider.  Work with a counselor or diabetes educator to identify strategies to manage stress and any emotional and social challenges. What are some questions to ask my health care provider?  Do I need to meet with a diabetes educator?  Do I need to meet with a dietitian?  What number can I call if I have questions?  When are the best times to check my blood glucose? Where to find more information:  American Diabetes Association: diabetes.org/food-and-fitness/food  Academy of Nutrition and Dietetics:  www.eatright.org/resources/health/diseases-and-conditions/diabetes  National Institute of Diabetes and Digestive and Kidney Diseases (NIH): www.niddk.nih.gov/health-information/diabetes/overview/diet-eating-physical-activity Summary  A healthy meal plan will help you control your blood glucose and maintain a healthy lifestyle.  Working with a diet and nutrition specialist (dietitian) can help you make a meal plan that is best for you.  Keep in mind that carbohydrates and alcohol have immediate effects on your blood glucose levels. It is important to count carbohydrates and to use alcohol carefully. This information is not intended to replace advice given to you by your health care provider. Make sure you discuss any questions you have with your health care provider. Document Released: 10/29/2004 Document Revised: 03/08/2016 Document Reviewed: 03/08/2016 Elsevier Interactive Patient Education  2018 [employer]  

## 2018-01-26 NOTE — Progress Notes (Signed)
Subjective:    Patient ID: Charles Pennington, male    DOB: [birth year], 52 y.o.   MRN: [id number]  Chief Complaint  Patient presents with  . Diabetes    recheck   PT presents to the office today to recheck diabetes. He was seen on 12/29/17 for chronic follow up. He is noncompliant patient. States he has been taking his Trul[place] every week and [place] most days. He states he has restarted his metformin since our last visit.   He states he is unsure if he took his tresiba yesterday.  Diabetes  He presents for his follow-up diabetic visit. He has type 2 diabetes mellitus. Pertinent negatives for diabetes include no blurred vision, no foot paresthesias and no visual change. Symptoms are worsening. Diabetic complications include heart disease. Risk factors for coronary artery disease include dyslipidemia, diabetes mellitus, family history, male sex, hypertension and sedentary lifestyle. Current diabetic treatment includes oral agent (monotherapy) and insulin injections. He is following a generally unhealthy diet. His overall blood glucose range is 180-200 mg/dl. Eye exam is current.      Review of Systems  Eyes: Negative for blurred vision.  Respiratory: Positive for wheezing.   All other systems reviewed and are negative.      Objective:   Physical Exam Vitals signs reviewed.  Constitutional:      General: He is not in acute distress.    Appearance: He is well-developed.  HENT:     Head: Normocephalic.  Neck:     Musculoskeletal: Normal range of motion and neck supple.     Thyroid: No thyromegaly.  Cardiovascular:     Rate and Rhythm: Normal rate and regular rhythm.     Heart sounds: Normal heart sounds. No murmur.  Pulmonary:     Effort: Pulmonary effort is normal. No respiratory distress.     Breath sounds: Wheezing present.  Abdominal:     General: Bowel sounds are normal. There is no distension.     Palpations: Abdomen is soft.     Tenderness: There is no abdominal  tenderness.  Musculoskeletal:        General: No tenderness.     Right lower leg: No edema.     Left lower leg: No edema.     Comments: Pain in lumbar with extension  Skin:    General: Skin is warm and dry.     Findings: No erythema or rash.  Neurological:     Mental Status: He is alert and oriented to person, place, and time.     Cranial Nerves: No cranial nerve deficit.     Deep Tendon Reflexes: Reflexes are normal and symmetric.  Psychiatric:        Behavior: Behavior normal.        Thought Content: Thought content normal.        Judgment: Judgment normal.      BP 127/87   Pulse 94   Temp (!) 97.5 F (36.4 C) (Oral)   Ht 5' 8"  (1.727 m)   Wt 233 lb 9.6 oz (106 kg)   BMI 35.52 kg/m      Assessment & Plan:  RIAZ ONORATO comes in today with chief complaint of Diabetes (recheck)   Diagnosis and orders addressed:  1. Diabetic polyneuropathy associated with type 2 diabetes mellitus (HCC) - Glucose Hemocue Waived - BMP8+EGFR  2. Type 2 diabetes mellitus treated with insulin (HCC) - Glucose Hemocue Waived - BMP8+EGFR   3. Noncompliance with medications   Long discussion  with patient about the importance of taking his medications every day. I will hold off on adding short acting insulin as I am worried he will get these confused with his other injections. He is very noncompliant with his medications. He can not read and this does add to his noncompliance.    He states he will start taking all of his medications every day. Discussed if we can not get blood glucose controlled we will do referral to Endo.   He will follow up 01/30/18   Evelina Dun, FNP

## 2018-01-27 ENCOUNTER — Other Ambulatory Visit: Payer: Self-pay | Admitting: Family

## 2018-01-27 DIAGNOSIS — G4700 Insomnia, unspecified: Secondary | ICD-10-CM

## 2018-01-27 LAB — BMP8+EGFR
BUN/Creatinine Ratio: 15 (ref 9–20)
BUN: 14 mg/dL (ref 6–24)
CALCIUM: 10.3 mg/dL — AB (ref 8.7–10.2)
CHLORIDE: 91 mmol/L — AB (ref 96–106)
CO2: 25 mmol/L (ref 20–29)
Creatinine, Ser: 0.94 mg/dL (ref 0.76–1.27)
GFR calc non Af Amer: 93 mL/min/{1.73_m2} (ref >59)
GFR, EST AFRICAN AMERICAN: 108 mL/min/{1.73_m2} (ref >59)
Glucose: 546 mg/dL (ref 65–99)
POTASSIUM: 4.6 mmol/L (ref 3.5–5.2)
Sodium: 133 mmol/L — ABNORMAL LOW (ref 134–144)

## 2018-01-27 LAB — GLUCOSE HEMOCUE WAIVED: Glu Hemocue Waived: >444 mg/dL (ref 65–99)

## 2018-01-27 NOTE — Telephone Encounter (Signed)
Just seen yesterday

## 2018-01-27 NOTE — Telephone Encounter (Signed)
Patient needs an appt.

## 2018-01-30 ENCOUNTER — Ambulatory Visit (INDEPENDENT_AMBULATORY_CARE_PROVIDER_SITE_OTHER): Payer: Medicare Other | Admitting: Family

## 2018-01-30 ENCOUNTER — Encounter: Payer: Self-pay | Admitting: Family

## 2018-01-30 VITALS — BP 109/80 | HR 89 | Temp 97.2°F | Ht 68.0 in | Wt 237.0 lb

## 2018-01-30 DIAGNOSIS — E119 Type 2 diabetes mellitus without complications: Secondary | ICD-10-CM

## 2018-01-30 DIAGNOSIS — Z8639 Personal history of other endocrine, nutritional and metabolic disease: Secondary | ICD-10-CM

## 2018-01-30 DIAGNOSIS — Z794 Long term (current) use of insulin: Secondary | ICD-10-CM | POA: Diagnosis not present

## 2018-01-30 DIAGNOSIS — R7309 Other abnormal glucose: Secondary | ICD-10-CM | POA: Diagnosis not present

## 2018-01-30 LAB — GLUCOSE HEMOCUE WAIVED: Glu Hemocue Waived: 131 mg/dL — ABNORMAL HIGH (ref 65–99)

## 2018-01-30 NOTE — Patient Instructions (Signed)
Diabetes Mellitus and Nutrition When you have diabetes (diabetes mellitus), it is very important to have healthy eating habits because your blood sugar (glucose) levels are greatly affected by what you eat and drink. Eating healthy foods in the appropriate amounts, at about the same times every day, can help you:  Control your blood glucose.  Lower your risk of heart disease.  Improve your blood pressure.  Reach or maintain a healthy weight.  Every person with diabetes is different, and each person has different needs for a meal plan. Your health care provider may recommend that you work with a diet and nutrition specialist (dietitian) to make a meal plan that is best for you. Your meal plan may vary depending on factors such as:  The calories you need.  The medicines you take.  Your weight.  Your blood glucose, blood pressure, and cholesterol levels.  Your activity level.  Other health conditions you have, such as heart or kidney disease.  How do carbohydrates affect me? Carbohydrates affect your blood glucose level more than any other type of food. Eating carbohydrates naturally increases the amount of glucose in your blood. Carbohydrate counting is a method for keeping track of how many carbohydrates you eat. Counting carbohydrates is important to keep your blood glucose at a healthy level, especially if you use insulin or take certain oral diabetes medicines. It is important to know how many carbohydrates you can safely have in each meal. This is different for every person. Your dietitian can help you calculate how many carbohydrates you should have at each meal and for snack. Foods that contain carbohydrates include:  Bread, cereal, rice, pasta, and crackers.  Potatoes and corn.  Peas, beans, and lentils.  Milk and yogurt.  Fruit and juice.  Desserts, such as cakes, cookies, ice cream, and candy.  How does alcohol affect me? Alcohol can cause a sudden decrease in blood  glucose (hypoglycemia), especially if you use insulin or take certain oral diabetes medicines. Hypoglycemia can be a life-threatening condition. Symptoms of hypoglycemia (sleepiness, dizziness, and confusion) are similar to symptoms of having too much alcohol. If your health care provider says that alcohol is safe for you, follow these guidelines:  Limit alcohol intake to no more than 1 drink per day for nonpregnant women and 2 drinks per day for men. One drink equals 12 oz of beer, 5 oz of wine, or 1 oz of hard liquor.  Do not drink on an empty stomach.  Keep yourself hydrated with water, diet soda, or unsweetened iced tea.  Keep in mind that regular soda, juice, and other mixers may contain a lot of sugar and must be counted as carbohydrates.  What are tips for following this plan? Reading food labels  Start by checking the serving size on the label. The amount of calories, carbohydrates, fats, and other nutrients listed on the label are based on one serving of the food. Many foods contain more than one serving per package.  Check the total grams (g) of carbohydrates in one serving. You can calculate the number of servings of carbohydrates in one serving by dividing the total carbohydrates by 15. For example, if a food has 30 g of total carbohydrates, it would be equal to 2 servings of carbohydrates.  Check the number of grams (g) of saturated and trans fats in one serving. Choose foods that have low or no amount of these fats.  Check the number of milligrams (mg) of sodium in one serving. Most people   should limit total sodium intake to less than 2,300 mg per day.  Always check the nutrition information of foods labeled as "low-fat" or "nonfat". These foods may be higher in added sugar or refined carbohydrates and should be avoided.  Talk to your dietitian to identify your daily goals for nutrients listed on the label. Shopping  Avoid buying canned, premade, or processed foods. These  foods tend to be high in fat, sodium, and added sugar.  Shop around the outside edge of the grocery store. This includes fresh fruits and vegetables, bulk grains, fresh meats, and fresh dairy. Cooking  Use low-heat cooking methods, such as baking, instead of high-heat cooking methods like deep frying.  Cook using healthy oils, such as olive, canola, or sunflower oil.  Avoid cooking with butter, cream, or high-fat meats. Meal planning  Eat meals and snacks regularly, preferably at the same times every day. Avoid going long periods of time without eating.  Eat foods high in fiber, such as fresh fruits, vegetables, beans, and whole grains. Talk to your dietitian about how many servings of carbohydrates you can eat at each meal.  Eat 4-6 ounces of lean protein each day, such as lean meat, chicken, fish, eggs, or tofu. 1 ounce is equal to 1 ounce of meat, chicken, or fish, 1 egg, or 1/4 cup of tofu.  Eat some foods each day that contain healthy fats, such as avocado, nuts, seeds, and fish. Lifestyle   Check your blood glucose regularly.  Exercise at least 30 minutes 5 or more days each week, or as told by your health care provider.  Take medicines as told by your health care provider.  Do not use any products that contain nicotine or tobacco, such as cigarettes and e-cigarettes. If you need help quitting, ask your health care provider.  Work with a counselor or diabetes educator to identify strategies to manage stress and any emotional and social challenges. What are some questions to ask my health care provider?  Do I need to meet with a diabetes educator?  Do I need to meet with a dietitian?  What number can I call if I have questions?  When are the best times to check my blood glucose? Where to find more information:  American Diabetes Association: diabetes.org/food-and-fitness/food  Academy of Nutrition and Dietetics:  www.eatright.org/resources/health/diseases-and-conditions/diabetes  National Institute of Diabetes and Digestive and Kidney Diseases (NIH): www.niddk.nih.gov/health-information/diabetes/overview/diet-eating-physical-activity Summary  A healthy meal plan will help you control your blood glucose and maintain a healthy lifestyle.  Working with a diet and nutrition specialist (dietitian) can help you make a meal plan that is best for you.  Keep in mind that carbohydrates and alcohol have immediate effects on your blood glucose levels. It is important to count carbohydrates and to use alcohol carefully. This information is not intended to replace advice given to you by your health care provider. Make sure you discuss any questions you have with your health care provider. Document Released: 10/29/2004 Document Revised: 03/08/2016 Document Reviewed: 03/08/2016 Elsevier Interactive Patient Education  2018 [employer]  

## 2018-01-30 NOTE — Progress Notes (Signed)
   Subjective:    Patient ID: Charles Pennington, male    DOB: [birth year], [age] y.o.   MRN: [id number]  Chief Complaint  Patient presents with  . Hyperglycemia   PT presents to the office today to recheck glucose. States he took his glucose this morning around 163 and then rechecked a couple of hours later and it was 300's. He states last night he only took 30 units of [place] last night because it was 3 AM.  He is noncompliant with his medications and can not read. States he did not know he was suppose to take two metformins.  Diabetes  He presents for his follow-up diabetic visit. He has type 2 diabetes mellitus. His disease course has been worsening. There are no hypoglycemic associated symptoms. Associated symptoms include blurred vision and foot paresthesias. Symptoms are stable. Diabetic complications include heart disease, nephropathy and peripheral neuropathy. Risk factors for coronary artery disease include dyslipidemia, diabetes mellitus, male sex and hypertension. He is following a generally unhealthy diet. His overall blood glucose range is >200 mg/dl.      Review of Systems  Eyes: Positive for blurred vision.  All other systems reviewed and are negative.      Objective:   Physical Exam Vitals signs reviewed.  Constitutional:      General: He is not in acute distress.    Appearance: He is well-developed.  Neck:     Musculoskeletal: Normal range of motion and neck supple.     Thyroid: No thyromegaly.  Cardiovascular:     Rate and Rhythm: Normal rate and regular rhythm.     Heart sounds: Normal heart sounds. No murmur.  Pulmonary:     Effort: Pulmonary effort is normal. No respiratory distress.     Breath sounds: Wheezing present.  Abdominal:     General: Bowel sounds are normal. There is no distension.     Palpations: Abdomen is soft.     Tenderness: There is no abdominal tenderness.  Musculoskeletal: Normal range of motion.        General: No tenderness.  Skin:   General: Skin is warm and dry.     Findings: No erythema or rash.  Neurological:     Mental Status: He is alert and oriented to person, place, and time.     Cranial Nerves: No cranial nerve deficit.     Deep Tendon Reflexes: Reflexes are normal and symmetric.  Psychiatric:        Behavior: Behavior normal.        Thought Content: Thought content normal.        Judgment: Judgment normal.      BP 109/80   Pulse 89   Temp (!) 97.2 F (36.2 C) (Oral)   Ht 5\' 8"  (1.727 m)   Wt 237 lb (107.5 kg)   BMI 36.04 kg/m      Assessment & Plan:  Charles Pennington comes in today with chief complaint of Hyperglycemia   Diagnosis and orders addressed:  1. History of elevated glucose - Glucose Hemocue Waived  2. Type 2 diabetes mellitus treated with insulin (HCC) Long discussion about medications  I gave printed list of medications, he will start taking 2 tab of metformin 750 mg daily and will continue Tresiba 70 units daily!!! Strict low carb diet RTO in 1 months   Jannifer Rodneyhristy Ryiah Bellissimo, FNP

## 2018-02-01 DIAGNOSIS — M5136 Other intervertebral disc degeneration, lumbar region: Secondary | ICD-10-CM | POA: Diagnosis not present

## 2018-02-01 DIAGNOSIS — Z79899 Other long term (current) drug therapy: Secondary | ICD-10-CM | POA: Diagnosis not present

## 2018-02-01 DIAGNOSIS — G894 Chronic pain syndrome: Secondary | ICD-10-CM | POA: Diagnosis not present

## 2018-02-09 ENCOUNTER — Other Ambulatory Visit: Payer: Medicare Other

## 2018-02-11 ENCOUNTER — Other Ambulatory Visit: Payer: Self-pay | Admitting: Family

## 2018-02-14 DIAGNOSIS — R05 Cough: Secondary | ICD-10-CM | POA: Diagnosis not present

## 2018-02-14 DIAGNOSIS — R0609 Other forms of dyspnea: Secondary | ICD-10-CM | POA: Diagnosis not present

## 2018-02-14 DIAGNOSIS — M791 Myalgia, unspecified site: Secondary | ICD-10-CM | POA: Diagnosis not present

## 2018-02-14 DIAGNOSIS — R140 Abdominal distension (gaseous): Secondary | ICD-10-CM | POA: Diagnosis not present

## 2018-02-14 DIAGNOSIS — Z794 Long term (current) use of insulin: Secondary | ICD-10-CM | POA: Diagnosis not present

## 2018-02-14 DIAGNOSIS — R51 Headache: Secondary | ICD-10-CM | POA: Diagnosis not present

## 2018-02-14 DIAGNOSIS — R0981 Nasal congestion: Secondary | ICD-10-CM | POA: Diagnosis not present

## 2018-02-14 DIAGNOSIS — E1165 Type 2 diabetes mellitus with hyperglycemia: Secondary | ICD-10-CM | POA: Diagnosis not present

## 2018-02-14 DIAGNOSIS — R093 Abnormal sputum: Secondary | ICD-10-CM | POA: Diagnosis not present

## 2018-02-14 DIAGNOSIS — R1011 Right upper quadrant pain: Secondary | ICD-10-CM | POA: Diagnosis not present

## 2018-02-14 DIAGNOSIS — E039 Hypothyroidism, unspecified: Secondary | ICD-10-CM | POA: Diagnosis not present

## 2018-02-14 DIAGNOSIS — Z8709 Personal history of other diseases of the respiratory system: Secondary | ICD-10-CM | POA: Diagnosis not present

## 2018-02-14 DIAGNOSIS — J209 Acute bronchitis, unspecified: Secondary | ICD-10-CM | POA: Diagnosis not present

## 2018-02-14 DIAGNOSIS — S2232XD Fracture of one rib, left side, subsequent encounter for fracture with routine healing: Secondary | ICD-10-CM | POA: Diagnosis not present

## 2018-02-14 DIAGNOSIS — R0600 Dyspnea, unspecified: Secondary | ICD-10-CM | POA: Diagnosis not present

## 2018-02-14 DIAGNOSIS — R0982 Postnasal drip: Secondary | ICD-10-CM | POA: Diagnosis not present

## 2018-02-14 DIAGNOSIS — R0602 Shortness of breath: Secondary | ICD-10-CM | POA: Diagnosis not present

## 2018-02-22 ENCOUNTER — Ambulatory Visit (INDEPENDENT_AMBULATORY_CARE_PROVIDER_SITE_OTHER): Payer: Medicare Other | Admitting: Family Medicine

## 2018-02-22 ENCOUNTER — Encounter: Payer: Self-pay | Admitting: Family Medicine

## 2018-02-22 VITALS — BP 125/78 | HR 108 | Temp 98.8°F | Ht 68.0 in | Wt 234.2 lb

## 2018-02-22 DIAGNOSIS — J441 Chronic obstructive pulmonary disease with (acute) exacerbation: Secondary | ICD-10-CM | POA: Diagnosis not present

## 2018-02-22 MED ORDER — ALBUTEROL SULFATE (2.5 MG/3ML) 0.083% IN NEBU: 2.5000 mg | INHALATION_SOLUTION | Freq: Four times a day (QID) | RESPIRATORY_TRACT | 1 refills | Status: DC | PRN

## 2018-02-22 MED ORDER — AMOXICILLIN-POT CLAVULANATE 875-125 MG PO TABS: 1.0000 | ORAL_TABLET | Freq: Two times a day (BID) | ORAL | 0 refills | Status: DC

## 2018-02-22 MED ORDER — PREDNISONE 20 MG PO TABS: ORAL_TABLET | ORAL | 0 refills | Status: DC

## 2018-02-22 NOTE — Progress Notes (Signed)
BP 125/78   Pulse (!) 108   Temp 98.8 F (37.1 C) (Oral)   Ht 5\' 8"  (1.727 m)   Wt 234 lb 3.2 oz (106.2 kg)   SpO2 94%   BMI 35.61 kg/m    Subjective:    Patient ID: Charles Pennington, male    DOB: December 05, 1965, 53 y.o.   MRN: [id number]  HPI: Charles Pennington is a 53 y.o. male presenting on 02/22/2018 for Shortness of Breath (x 1-2 weeks. Patient states he was seen x 1 week ago at [facility] and states they gave him a breathing treatment which really helped. )   HPI Cough and congestion and wheezing Patient is coming in today with cough and congestion and wheezing this been going on for the past few weeks.  He did go into the emergency department a week ago and was given treatments and steroids and he did improve when he was on them but then it is come right back.  He finished his steroids 2 days ago and he finishes antibiotic a couple days ago as well.  He says he was given azithromycin.  He says that she is just not been improving and he feels like it is worsening again over the past couple days since he stopped the medicines.  He does admit that he supposed to have Trelegy but it does not seem like he is taking it very frequently and he does have albuterol but he also uses that only infrequently.  When he was at the emergency department to give him a nebulizer breathing treatment and he felt a lot better with that and would like a nebulizer machine.  He does admit that he is still smoking and that does make these things worse and he has backed down a little bit since he has been sick but he is still smoking.  Relevant past medical, surgical, family and social history reviewed and updated as indicated. Interim medical history since our last visit reviewed. Allergies and medications reviewed and updated.  Review of Systems  Constitutional: Negative for chills and fever.  HENT: Positive for congestion, postnasal drip, rhinorrhea, sinus pressure, sneezing and sore throat. Negative for  ear discharge, ear pain and voice change.   Eyes: Negative for pain, discharge, redness and visual disturbance.  Respiratory: Positive for cough and wheezing. Negative for shortness of breath.   Cardiovascular: Negative for chest pain and leg swelling.  Musculoskeletal: Negative for gait problem.  Skin: Negative for rash.  All other systems reviewed and are negative.   Per HPI unless specifically indicated above        Objective:    BP 125/78   Pulse (!) 108   Temp 98.8 F (37.1 C) (Oral)   Ht 5\' 8"  (1.727 m)   Wt 234 lb 3.2 oz (106.2 kg)   SpO2 94%   BMI 35.61 kg/m   Wt Readings from Last 3 Encounters:  02/22/18 234 lb 3.2 oz (106.2 kg)  01/30/18 237 lb (107.5 kg)  01/26/18 233 lb 9.6 oz (106 kg)    Physical Exam Vitals signs and nursing note reviewed.  Constitutional:      General: He is not in acute distress.    Appearance: He is well-developed. He is not diaphoretic.  HENT:     Right Ear: Tympanic membrane, ear canal and external ear normal.     Left Ear: Tympanic membrane, ear canal and external ear normal.     Nose: Mucosal edema and rhinorrhea present.  Right Sinus: Maxillary sinus tenderness present. No frontal sinus tenderness.     Left Sinus: Maxillary sinus tenderness present. No frontal sinus tenderness.     Mouth/Throat:     Pharynx: Uvula midline. Posterior oropharyngeal erythema present. No oropharyngeal exudate.     Tonsils: No tonsillar abscesses.  Eyes:     General: No scleral icterus.       Right eye: No discharge.     Conjunctiva/sclera: Conjunctivae normal.  Neck:     Musculoskeletal: Neck supple.     Thyroid: No thyromegaly.  Cardiovascular:     Rate and Rhythm: Normal rate and regular rhythm.     Heart sounds: Normal heart sounds. No murmur.  Pulmonary:     Effort: Pulmonary effort is normal. No respiratory distress.     Breath sounds: Examination of the right-upper field reveals rhonchi. Examination of the left-upper field reveals  rhonchi. Examination of the right-middle field reveals rhonchi. Examination of the left-middle field reveals rhonchi. Examination of the right-lower field reveals rhonchi. Examination of the left-lower field reveals rhonchi. Rhonchi present. No wheezing or rales.  Musculoskeletal: Normal range of motion.  Lymphadenopathy:     Cervical: No cervical adenopathy.  Skin:    General: Skin is warm and dry.     Findings: No rash.  Neurological:     Mental Status: He is alert and oriented to person, place, and time.     Coordination: Coordination normal.  Psychiatric:        Behavior: Behavior normal.       Assessment & Plan:   Problem List Items Addressed This Visit    None    Visit Diagnoses    COPD exacerbation (HCC)    -  Primary   Relevant Medications   amoxicillin-clavulanate (AUGMENTIN) 875-125 MG tablet   albuterol (PROVENTIL) (2.5 MG/3ML) 0.083% nebulizer solution   predniSONE (DELTASONE) 20 MG tablet   Other Relevant Orders   DME Nebulizer machine       Follow up plan: Return if symptoms worsen or fail to improve.  Counseling provided for all of the vaccine components Orders Placed This Encounter  Procedures  . DME Nebulizer machine    Arville Care, MD [facility] 02/22/2018, 10:39 AM

## 2018-02-27 ENCOUNTER — Other Ambulatory Visit: Payer: Self-pay | Admitting: Family

## 2018-02-27 DIAGNOSIS — G4700 Insomnia, unspecified: Secondary | ICD-10-CM

## 2018-02-27 NOTE — Telephone Encounter (Signed)
Last seen 02/22/2018

## 2018-02-28 DIAGNOSIS — Z79899 Other long term (current) drug therapy: Secondary | ICD-10-CM | POA: Diagnosis not present

## 2018-03-02 ENCOUNTER — Ambulatory Visit (INDEPENDENT_AMBULATORY_CARE_PROVIDER_SITE_OTHER): Payer: Medicare Other

## 2018-03-02 ENCOUNTER — Encounter: Payer: Self-pay | Admitting: Family

## 2018-03-02 ENCOUNTER — Ambulatory Visit (INDEPENDENT_AMBULATORY_CARE_PROVIDER_SITE_OTHER): Payer: Medicare Other | Admitting: Family

## 2018-03-02 VITALS — BP 106/71 | HR 109 | Temp 98.1°F | Ht 68.0 in | Wt 234.0 lb

## 2018-03-02 DIAGNOSIS — F331 Major depressive disorder, recurrent, moderate: Secondary | ICD-10-CM

## 2018-03-02 DIAGNOSIS — F411 Generalized anxiety disorder: Secondary | ICD-10-CM | POA: Diagnosis not present

## 2018-03-02 DIAGNOSIS — M79645 Pain in left finger(s): Secondary | ICD-10-CM | POA: Diagnosis not present

## 2018-03-02 DIAGNOSIS — S6992XA Unspecified injury of left wrist, hand and finger(s), initial encounter: Secondary | ICD-10-CM

## 2018-03-02 MED ORDER — DULOXETINE HCL 60 MG PO CPEP: 60.0000 mg | ORAL_CAPSULE | Freq: Every day | ORAL | 2 refills | Status: DC

## 2018-03-02 NOTE — Patient Instructions (Signed)
Laceration Care, Adult  A laceration is a cut that may go through all layers of the skin. The cut may also go into the tissue that is right under the skin. Some cuts heal on their own. Others need to be closed with stitches (sutures), staples, skin adhesive strips, or skin glue. Taking care of your injury lowers your risk of infection, helps your injury to heal better, and may prevent scarring.  Supplies needed:   Soap.   Water.   Hand sanitizer.   Bandage (dressing).   Antibiotic ointment.   Clean towel.  How to take care of your cut  Wash your hands with soap and water before touching your wound or changing your bandage. If soap and water are not available, use hand sanitizer.  If your doctor used stitches or staples:   Keep the wound clean and dry.   If you were given a bandage, change it at least once a day as told by your doctor. You should also change it if it gets wet or dirty.   Keep the wound completely dry for the first 24 hours, or as told by your doctor. After that, you may take a shower or a bath. Do not get the wound soaked in water until after the stitches or staples have been removed.   Clean the wound once a day, or as told by your doctor:  ? Wash the wound with soap and water.  ? Rinse the wound with water to remove all soap.  ? Pat the wound dry with a clean towel. Do not rub the wound.   After you clean the wound, put a thin layer of antibiotic ointment on it as told by your doctor. This ointment:  ? Helps to prevent infection.  ? Keeps the bandage from sticking to the wound.   Have your stitches or staples removed as told by your doctor.  If your doctor used skin adhesive strips:   Keep the wound clean and dry.   If you were given a bandage, you should change it at least once a day as told by your doctor. You should also change it if it gets wet or dirty.   Do not get the skin adhesive strips wet. You can take a shower or a bath, but keep the wound dry.   If the wound gets wet,  pat it dry with a clean towel. Do not rub the wound.   Skin adhesive strips fall off on their own. You can trim the strips as the wound heals. Do not remove any strips that are still stuck to the wound. They will fall off after a while.  If your doctor used skin glue:   Try to keep your wound dry, but you may briefly wet it in the shower or bath. Do not soak the wound in water, such as by swimming.   After you take a shower or a bath, gently pat the wound dry with a clean towel. Do not rub the wound.   Do not do any activities that will make you really sweaty until the skin glue has fallen off on its own.   Do not apply liquid, cream, or ointment medicine to your wound while the skin glue is still on.   If you were given a bandage, you should change it at least once a day or as told by your doctor. You should also change it if it gets dirty or wet.   If a bandage is placed   over the wound, do not let the tape touch the skin glue.   Do not pick at the glue. The skin glue usually stays on for 5-10 days. Then, it falls off the skin.  General instructions     Take over-the-counter and prescription medicines only as told by your doctor.   If you were given antibiotic medicine or ointment, take or apply it as told by your doctor. Do not stop using it even if your condition improves.   Do not scratch or pick at the wound.   Check your wound every day for signs of infection. Watch for:  ? Redness, swelling, or pain.  ? Fluid, blood, or pus.   Raise (elevate) the injured area above the level of your heart while you are sitting or lying down.   If directed, put ice on the affected area:  ? Put ice in a plastic bag.  ? Place a towel between your skin and the bag.  ? Leave the ice on for 20 minutes, 2-3 times a day.   Prevent scarring by covering your wound with sunscreen of at least 30 SPF whenever you are outside after your wound has healed.   Keep all follow-up visits as told by your doctor. This is  important.  Get help if:   You got a tetanus shot and you have any of these problems at the injection site:  ? Swelling.  ? Very bad pain.  ? Redness.  ? Bleeding.   You have a fever.   A wound that was closed breaks open.   You notice a bad smell coming from your wound or your bandage.   You notice something coming out of the wound, such as wood or glass.   Medicine does not relieve your pain.   You have more redness, swelling, or pain at the site of your wound.   You have fluid, blood, or pus coming from your wound.   You notice a change in the color of your skin near your wound.   You need to change the bandage often because fluid, blood, or pus is coming from the wound.   You start to have a new rash.   You start to have numbness around the wound.  Get help right away if:   You have very bad swelling around the wound.   Your pain suddenly gets worse and is very bad.   You notice painful lumps near the wound or anywhere on your body.   You have a red streak going away from your wound.   The wound is on your hand or foot, and:  ? You cannot move a finger or toe.  ? Your fingers or toes look pale or bluish.  Summary   A laceration is a cut that may go through all layers of the skin. The cut may also go into the tissue right under the skin.   Some cuts heal on their own. Others need to be closed with stitches, staples, skin adhesive strips, or skin glue.   Follow your doctor's instructions for caring for your cut. Proper care of a cut lowers the risk of infection, helps the cut heal better, and prevents scarring.  This information is not intended to replace advice given to you by your health care provider. Make sure you discuss any questions you have with your health care provider.  Document Released: 07/21/2007 Document Revised: 02/21/2017 Document Reviewed: 02/21/2017  Elsevier Interactive Patient Education  2019 [employer]

## 2018-03-02 NOTE — Progress Notes (Signed)
Subjective:    Patient ID: Charles Pennington, male    DOB: 03-07-1965, 53 y.o.   MRN: [id number]  Chief Complaint  Patient presents with  . left ring finger injury    Laceration   The incident occurred 1 to 3 hours ago (hit his finger with hammer). Pain location: left ring finger. Injury mechanism: hammer. The pain is at a severity of 9/10. The pain is moderate. The pain has been constant since onset. He reports no foreign bodies present. His tetanus status is UTD.  Depression         This is a chronic problem.  The current episode started more than 1 year ago.   The onset quality is gradual.   The problem occurs constantly.  The problem has been waxing and waning since onset.  Associated symptoms include fatigue, helplessness, hopelessness, irritable, restlessness, decreased interest and sad.  Past treatments include SSRIs - Selective serotonin reuptake inhibitors.     Review of Systems  Constitutional: Positive for fatigue.  Musculoskeletal: Positive for arthralgias.  Psychiatric/Behavioral: Positive for depression.  All other systems reviewed and are negative.      Objective:   Physical Exam Vitals signs reviewed.  Constitutional:      General: [name]. He is not in acute distress.    Appearance: He is well-developed.  HENT:     Head: Normocephalic.  Neck:     Musculoskeletal: Normal range of motion and neck supple.     Thyroid: No thyromegaly.  Cardiovascular:     Rate and Rhythm: Normal rate and regular rhythm.     Heart sounds: Normal heart sounds. No murmur.  Pulmonary:     Effort: Pulmonary effort is normal. No respiratory distress.     Breath sounds: Normal breath sounds. No wheezing.  Musculoskeletal: Normal range of motion.        General: No tenderness.  Skin:    Findings: Laceration present. No erythema or rash.     Comments: Distal left ring finger with an laceration of the epidermis. Not actively bleeding. Bruising present and mild swelling.     Neurological:     Mental Status: He is alert and oriented to person, place, and time.     Cranial Nerves: No cranial nerve deficit.     Deep Tendon Reflexes: Reflexes are normal and symmetric.  Psychiatric:        Mood and Affect: Affect is tearful.        Behavior: Behavior normal.        Thought Content: Thought content normal.        Judgment: Judgment normal.    Area cleaned and Dermabond applied to finger. Band-aid.  BP 106/71   Pulse (!) 109   Temp 98.1 F (36.7 C) (Oral)   Ht 5\' 8"  (1.727 m)   Wt 234 lb (106.1 kg)   BMI 35.58 kg/m      Assessment & Plan:  Charles Pennington comes in today with chief complaint of left ring finger injury   Diagnosis and orders addressed:  1. Injury of finger of left hand, initial encounter Pt taking Augmentin at this time. Complete this.  Keep clean and dry Report any redness, discharge, fevers, or discharge - DG Finger Ring Left; Future  2. Moderate episode of recurrent major depressive disorder (HCC) We will stop Paxil and start Cymbalta 60 mg today Stress management discussed - DULoxetine (CYMBALTA) 60 MG capsule; Take 1 capsule (60 mg total) by mouth daily.  Dispense: 90 capsule; Refill: 2  3. GAD (generalized anxiety disorder) - DULoxetine (CYMBALTA) 60 MG capsule; Take 1 capsule (60 mg total) by mouth daily.  Dispense: 90 capsule; Refill: 2  Return on 03/06/18 to recheck finger   [name], FNP

## 2018-03-06 ENCOUNTER — Ambulatory Visit (INDEPENDENT_AMBULATORY_CARE_PROVIDER_SITE_OTHER): Payer: Medicare Other | Admitting: Family

## 2018-03-06 ENCOUNTER — Encounter: Payer: Self-pay | Admitting: Family

## 2018-03-06 VITALS — BP 129/90 | HR 86 | Temp 97.2°F | Ht 68.0 in | Wt 245.8 lb

## 2018-03-06 DIAGNOSIS — F331 Major depressive disorder, recurrent, moderate: Secondary | ICD-10-CM

## 2018-03-06 DIAGNOSIS — Z5189 Encounter for other specified aftercare: Secondary | ICD-10-CM

## 2018-03-06 DIAGNOSIS — E119 Type 2 diabetes mellitus without complications: Secondary | ICD-10-CM

## 2018-03-06 DIAGNOSIS — Z794 Long term (current) use of insulin: Secondary | ICD-10-CM

## 2018-03-06 DIAGNOSIS — F411 Generalized anxiety disorder: Secondary | ICD-10-CM | POA: Diagnosis not present

## 2018-03-06 LAB — BAYER DCA HB A1C WAIVED: HB A1C (BAYER DCA - WAIVED): 10.5 % — ABNORMAL HIGH (ref <7.0)

## 2018-03-06 MED ORDER — SULFAMETHOXAZOLE-TRIMETHOPRIM 800-160 MG PO TABS: 1.0000 | ORAL_TABLET | Freq: Two times a day (BID) | ORAL | 0 refills | Status: DC

## 2018-03-06 NOTE — Patient Instructions (Signed)
Wound Care, Adult  Taking care of your wound properly can help to prevent pain, infection, and scarring. It can also help your wound to heal more quickly.  How to care for your wound  Wound care          Follow instructions from your health care provider about how to take care of your wound. Make sure you:  ? Wash your hands with soap and water before you change the bandage (dressing). If soap and water are not available, use hand sanitizer.  ? Change your dressing as told by your health care provider.  ? Leave stitches (sutures), skin glue, or adhesive strips in place. These skin closures may need to stay in place for 2 weeks or longer. If adhesive strip edges start to loosen and curl up, you may trim the loose edges. Do not remove adhesive strips completely unless your health care provider tells you to do that.   Check your wound area every day for signs of infection. Check for:  ? Redness, swelling, or pain.  ? Fluid or blood.  ? Warmth.  ? Pus or a bad smell.   Ask your health care provider if you should clean the wound with mild soap and water. Doing this may include:  ? Using a clean towel to pat the wound dry after cleaning it. Do not rub or scrub the wound.  ? Applying a cream or ointment. Do this only as told by your health care provider.  ? Covering the incision with a clean dressing.   Ask your health care provider when you can leave the wound uncovered.   Keep the dressing dry until your health care provider says it can be removed. Do not take baths, swim, use a hot tub, or do anything that would put the wound underwater until your health care provider approves. Ask your health care provider if you can take showers. You may only be allowed to take sponge baths.  Medicines     If you were prescribed an antibiotic medicine, cream, or ointment, take or use the antibiotic as told by your health care provider. Do not stop taking or using the antibiotic even if your condition improves.   Take  over-the-counter and prescription medicines only as told by your health care provider. If you were prescribed pain medicine, take it 30 or more minutes before you do any wound care or as told by your health care provider.  General instructions   Return to your normal activities as told by your health care provider. Ask your health care provider what activities are safe.   Do not scratch or pick at the wound.   Do not use any products that contain nicotine or tobacco, such as cigarettes and e-cigarettes. These may delay wound healing. If you need help quitting, ask your health care provider.   Keep all follow-up visits as told by your health care provider. This is important.   Eat a diet that includes protein, vitamin A, vitamin C, and other nutrient-rich foods to help the wound heal.  ? Foods rich in protein include meat, dairy, beans, nuts, and other sources.  ? Foods rich in vitamin A include carrots and dark green, leafy vegetables.  ? Foods rich in vitamin C include citrus, tomatoes, and other fruits and vegetables.  ? Nutrient-rich foods have protein, carbohydrates, fat, vitamins, or minerals. Eat a variety of healthy foods including vegetables, fruits, and whole grains.  Contact a health care provider if:     You received a tetanus shot and you have swelling, severe pain, redness, or bleeding at the injection site.   Your pain is not controlled with medicine.   You have redness, swelling, or pain around the wound.   You have fluid or blood coming from the wound.   Your wound feels warm to the touch.   You have pus or a bad smell coming from the wound.   You have a fever or chills.   You are nauseous or you vomit.   You are dizzy.  Get help right away if:   You have a red streak going away from your wound.   The edges of the wound open up and separate.   Your wound is bleeding, and the bleeding does not stop with gentle pressure.   You have a rash.   You faint.   You have trouble  breathing.  Summary   Always wash your hands with soap and water before changing your bandage (dressing).   To help with healing, eat foods that are rich in protein, vitamin A, vitamin C, and other nutrients.   Check your wound every day for signs of infection. Contact your health care provider if you suspect that your wound is infected.  This information is not intended to replace advice given to you by your health care provider. Make sure you discuss any questions you have with your health care provider.  Document Released: 11/11/2007 Document Revised: 03/15/2017 Document Reviewed: 08/19/2015  Elsevier Interactive Patient Education  2019 [employer]

## 2018-03-06 NOTE — Progress Notes (Signed)
Subjective:    Patient ID: Charles Pennington, male    DOB: May 15, 1965, 53 y.o.   MRN: [id number]  Chief Complaint  Patient presents with  . finger injury recheck   PT presents to the office today for wound recheck. He was seen on 03/02/18 after he hit his hand with a hammer and had an abrasion of his left ring finger. His finger looks better and reports his pain is improving.  He was crying, anxious, and depressed on our last visit stating he can not deal with his mother any more. We switched his Paxil to Cymbalta. It has not been sufficient amount of time to notice a difference, but he states he feels much better.  Wound Check  He was originally treated 3 to 5 days ago. His temperature was unmeasured prior to arrival. There is no redness present. The swelling has not changed. The pain has improved. There is difficulty moving the extremity or digit due to pain.  Anxiety  Presents for follow-up visit. Symptoms include decreased concentration, excessive worry, irritability and nervous/anxious behavior. Symptoms occur most days. The severity of symptoms is moderate.    Diabetes  He presents for his follow-up diabetic visit. He has type 2 diabetes mellitus. His disease course has been worsening. Hypoglycemia symptoms include nervousness/anxiousness. Associated symptoms include foot paresthesias and visual change. Pertinent negatives for diabetes include no blurred vision. Symptoms are worsening. He is following a generally unhealthy diet. His overall blood glucose range is 130-140 mg/dl.      Review of Systems  Constitutional: Positive for irritability.  Eyes: Negative for blurred vision.  Psychiatric/Behavioral: Positive for decreased concentration. The patient is nervous/anxious.   All other systems reviewed and are negative.      Objective:   Physical Exam Vitals signs reviewed.  Constitutional:      General: He is not in acute distress.    Appearance: He is well-developed.    HENT:     Head: Normocephalic.     Right Ear: Tympanic membrane normal.     Left Ear: Tympanic membrane normal.  Eyes:     General:        Right eye: No discharge.        Left eye: No discharge.     Pupils: Pupils are equal, round, and reactive to light.  Neck:     Musculoskeletal: Normal range of motion and neck supple.     Thyroid: No thyromegaly.  Cardiovascular:     Rate and Rhythm: Normal rate and regular rhythm.     Heart sounds: Normal heart sounds. No murmur.  Pulmonary:     Effort: Pulmonary effort is normal. No respiratory distress.     Breath sounds: Rhonchi present. No wheezing.  Abdominal:     General: Bowel sounds are normal. There is no distension.     Palpations: Abdomen is soft.     Tenderness: There is no abdominal tenderness.  Musculoskeletal: Normal range of motion.        General: No tenderness.  Skin:    General: Skin is warm and dry.     Findings: No erythema or rash.  Neurological:     Mental Status: He is alert and oriented to person, place, and time.     Cranial Nerves: No cranial nerve deficit.     Deep Tendon Reflexes: Reflexes are normal and symmetric.  Psychiatric:        Behavior: Behavior is hyperactive.        Thought Content:  Thought content normal.        Judgment: Judgment normal.       BP 129/90   Pulse 86   Temp (!) 97.2 F (36.2 C) (Oral)   Ht 5' 8"  (1.727 m)   Wt 245 lb 12.8 oz (111.5 kg)   BMI 37.37 kg/m      Assessment & Plan:  Charles Pennington comes in today with chief complaint of finger injury recheck   Diagnosis and orders addressed:  1. Type 2 diabetes mellitus treated with insulin (HCC) Strict low carb diet and continue medications!!! - Bayer DCA Hb A1c Waived - BMP8+EGFR - sulfamethoxazole-trimethoprim (BACTRIM DS) 800-160 MG tablet; Take 1 tablet by mouth 2 (two) times daily.  Dispense: 14 tablet; Refill: 0  2. Visit for wound check We will start Bactrim today since the wound is opened and is a  diabetic - BMP8+EGFR - sulfamethoxazole-trimethoprim (BACTRIM DS) 800-160 MG tablet; Take 1 tablet by mouth 2 (two) times daily.  Dispense: 14 tablet; Refill: 0  3. Moderate episode of recurrent major depressive disorder (HCC) Continue Cymbalta - BMP8+EGFR  4. GAD (generalized anxiety disorder) Continue Cymbalta - BMP8+EGFR   Labs pending Diet and exercise encouraged  Follow up plan: 1 month or if wound worsens or does not improve    Evelina Dun, FNP

## 2018-03-07 LAB — BMP8+EGFR
BUN/Creatinine Ratio: 12 (ref 9–20)
BUN: 12 mg/dL (ref 6–24)
CHLORIDE: 98 mmol/L (ref 96–106)
CO2: 25 mmol/L (ref 20–29)
Calcium: 8.9 mg/dL (ref 8.7–10.2)
Creatinine, Ser: 1 mg/dL (ref 0.76–1.27)
GFR calc Af Amer: 100 mL/min/{1.73_m2} (ref >59)
GFR calc non Af Amer: 87 mL/min/{1.73_m2} (ref >59)
Glucose: 184 mg/dL — ABNORMAL HIGH (ref 65–99)
Potassium: 4.5 mmol/L (ref 3.5–5.2)
Sodium: 139 mmol/L (ref 134–144)

## 2018-03-09 ENCOUNTER — Ambulatory Visit
Admission: RE | Admit: 2018-03-09 | Discharge: 2018-03-09 | Disposition: A | Discharge status: Home / Self Care | Payer: Medicare Other | Source: Ambulatory Visit | Attending: Nurse Practitioner | Admitting: Nurse Practitioner

## 2018-03-09 DIAGNOSIS — M545 Low back pain, unspecified: Secondary | ICD-10-CM

## 2018-03-09 DIAGNOSIS — M4326 Fusion of spine, lumbar region: Secondary | ICD-10-CM | POA: Diagnosis not present

## 2018-03-09 DIAGNOSIS — M79604 Pain in right leg: Secondary | ICD-10-CM

## 2018-03-09 MED ORDER — GADOBENATE DIMEGLUMINE 529 MG/ML IV SOLN
20.0000 mL | Freq: Once | INTRAVENOUS | Status: AC | PRN
  Administered 2018-03-09: 20 mL via INTRAVENOUS

## 2018-03-25 ENCOUNTER — Other Ambulatory Visit: Payer: Self-pay | Admitting: Family

## 2018-04-04 DIAGNOSIS — M5136 Other intervertebral disc degeneration, lumbar region: Secondary | ICD-10-CM | POA: Diagnosis not present

## 2018-04-04 DIAGNOSIS — G894 Chronic pain syndrome: Secondary | ICD-10-CM | POA: Diagnosis not present

## 2018-04-04 DIAGNOSIS — Z79899 Other long term (current) drug therapy: Secondary | ICD-10-CM | POA: Diagnosis not present

## 2018-04-07 DIAGNOSIS — M545 Low back pain: Secondary | ICD-10-CM | POA: Diagnosis not present

## 2018-04-07 DIAGNOSIS — M549 Dorsalgia, unspecified: Secondary | ICD-10-CM | POA: Diagnosis not present

## 2018-04-26 ENCOUNTER — Other Ambulatory Visit: Payer: Self-pay | Admitting: Family

## 2018-04-27 DIAGNOSIS — M5136 Other intervertebral disc degeneration, lumbar region: Secondary | ICD-10-CM | POA: Diagnosis not present

## 2018-05-02 ENCOUNTER — Ambulatory Visit: Payer: Medicare Other | Admitting: *Deleted

## 2018-05-02 LAB — HM DIABETES EYE EXAM

## 2018-05-03 DIAGNOSIS — M5136 Other intervertebral disc degeneration, lumbar region: Secondary | ICD-10-CM | POA: Diagnosis not present

## 2018-05-03 DIAGNOSIS — G894 Chronic pain syndrome: Secondary | ICD-10-CM | POA: Diagnosis not present

## 2018-05-03 DIAGNOSIS — Z79899 Other long term (current) drug therapy: Secondary | ICD-10-CM | POA: Diagnosis not present

## 2018-05-06 ENCOUNTER — Encounter: Payer: Self-pay | Admitting: Family

## 2018-05-08 ENCOUNTER — Other Ambulatory Visit: Payer: Self-pay | Admitting: Family

## 2018-05-16 DIAGNOSIS — M549 Dorsalgia, unspecified: Secondary | ICD-10-CM | POA: Diagnosis not present

## 2018-05-16 DIAGNOSIS — M5136 Other intervertebral disc degeneration, lumbar region: Secondary | ICD-10-CM | POA: Diagnosis not present

## 2018-05-25 ENCOUNTER — Other Ambulatory Visit: Payer: Self-pay | Admitting: Family

## 2018-05-25 DIAGNOSIS — G4700 Insomnia, unspecified: Secondary | ICD-10-CM

## 2018-05-29 NOTE — Telephone Encounter (Signed)
Patient is due for a visit, please schedule with a tele-visit with [facility]

## 2018-05-29 NOTE — Telephone Encounter (Signed)
Telephone appointment scheduled for 05-31-18.

## 2018-05-31 ENCOUNTER — Other Ambulatory Visit: Payer: Self-pay

## 2018-05-31 ENCOUNTER — Ambulatory Visit (INDEPENDENT_AMBULATORY_CARE_PROVIDER_SITE_OTHER): Payer: Medicare Other | Admitting: Family

## 2018-05-31 ENCOUNTER — Encounter: Payer: Self-pay | Admitting: Family

## 2018-05-31 DIAGNOSIS — E1159 Type 2 diabetes mellitus with other circulatory complications: Secondary | ICD-10-CM

## 2018-05-31 DIAGNOSIS — Z794 Long term (current) use of insulin: Secondary | ICD-10-CM

## 2018-05-31 DIAGNOSIS — M5440 Lumbago with sciatica, unspecified side: Secondary | ICD-10-CM

## 2018-05-31 DIAGNOSIS — E1142 Type 2 diabetes mellitus with diabetic polyneuropathy: Secondary | ICD-10-CM | POA: Diagnosis not present

## 2018-05-31 DIAGNOSIS — Z91148 Patient's other noncompliance with medication regimen for other reason: Secondary | ICD-10-CM

## 2018-05-31 DIAGNOSIS — G8929 Other chronic pain: Secondary | ICD-10-CM

## 2018-05-31 DIAGNOSIS — Z9114 Patient's other noncompliance with medication regimen: Secondary | ICD-10-CM

## 2018-05-31 DIAGNOSIS — E785 Hyperlipidemia, unspecified: Secondary | ICD-10-CM

## 2018-05-31 DIAGNOSIS — E039 Hypothyroidism, unspecified: Secondary | ICD-10-CM | POA: Diagnosis not present

## 2018-05-31 DIAGNOSIS — J4520 Mild intermittent asthma, uncomplicated: Secondary | ICD-10-CM

## 2018-05-31 DIAGNOSIS — F411 Generalized anxiety disorder: Secondary | ICD-10-CM

## 2018-05-31 DIAGNOSIS — F331 Major depressive disorder, recurrent, moderate: Secondary | ICD-10-CM

## 2018-05-31 DIAGNOSIS — G4700 Insomnia, unspecified: Secondary | ICD-10-CM | POA: Diagnosis not present

## 2018-05-31 DIAGNOSIS — F1120 Opioid dependence, uncomplicated: Secondary | ICD-10-CM

## 2018-05-31 DIAGNOSIS — Z8673 Personal history of transient ischemic attack (TIA), and cerebral infarction without residual deficits: Secondary | ICD-10-CM | POA: Diagnosis not present

## 2018-05-31 DIAGNOSIS — E1169 Type 2 diabetes mellitus with other specified complication: Secondary | ICD-10-CM | POA: Diagnosis not present

## 2018-05-31 DIAGNOSIS — K219 Gastro-esophageal reflux disease without esophagitis: Secondary | ICD-10-CM

## 2018-05-31 DIAGNOSIS — I152 Hypertension secondary to endocrine disorders: Secondary | ICD-10-CM

## 2018-05-31 DIAGNOSIS — I10 Essential (primary) hypertension: Secondary | ICD-10-CM

## 2018-05-31 DIAGNOSIS — E119 Type 2 diabetes mellitus without complications: Secondary | ICD-10-CM

## 2018-05-31 DIAGNOSIS — F17200 Nicotine dependence, unspecified, uncomplicated: Secondary | ICD-10-CM

## 2018-05-31 MED ORDER — DULAGLUTIDE 1.5 MG/0.5ML ~~LOC~~ SOAJ: SUBCUTANEOUS | 6 refills | Status: DC

## 2018-05-31 MED ORDER — PAROXETINE HCL 40 MG PO TABS: 40.0000 mg | ORAL_TABLET | Freq: Every day | ORAL | 1 refills | Status: DC

## 2018-05-31 MED ORDER — ZOLPIDEM TARTRATE 10 MG PO TABS: 10.0000 mg | ORAL_TABLET | Freq: Every day | ORAL | 2 refills | Status: DC

## 2018-05-31 NOTE — Progress Notes (Signed)
Virtual Visit via telephone Note  I connected with Charles Pennington on 05/31/18 at 8:52 AM by telephone and verified that I am speaking with the correct person using two identifiers. Charles Pennington is currently located at home and no one is currently with her during visit. The provider, Jannifer Rodneyhristy Mazen Marcin, FNP is located in their office at time of visit.  I discussed the limitations, risks, security and privacy concerns of performing an evaluation and management service by telephone and the availability of in person appointments. I also discussed with the patient that there may be a patient responsible charge related to this service. The patient expressed understanding and agreed to proceed.   History and Present Illness:   PT calls today for chronic follow up. He is followed by Pain clinic for chronic back pain once a month.  He is a poor historian and states he is taking his medications, but is unsure of all of the names.  Diabetes  He presents for his follow-up diabetic visit. He has type 2 diabetes mellitus. His disease course has been stable. Hypoglycemia symptoms include nervousness/anxiousness. Associated symptoms include blurred vision, foot paresthesias and visual change. Pertinent negatives for diabetes include no fatigue. There are no hypoglycemic complications. Symptoms are worsening. Diabetic complications include peripheral neuropathy. Risk factors for coronary artery disease include dyslipidemia, diabetes mellitus, hypertension, male sex and sedentary lifestyle. He is following a generally unhealthy diet. His overall blood glucose range is 130-140 mg/dl. An ACE inhibitor/angiotensin II receptor blocker is being taken.  Hypertension  This is a chronic problem. The current episode started more than 1 year ago. Associated symptoms include anxiety, blurred vision, palpitations and shortness of breath. Pertinent negatives include no malaise/fatigue or peripheral edema. Risk factors for  coronary artery disease include dyslipidemia, diabetes mellitus, obesity, male gender, smoking/tobacco exposure and sedentary lifestyle. The current treatment provides mild improvement. Hypertensive end-organ damage includes CAD/MI. Identifiable causes of hypertension include a thyroid problem.  Asthma  He complains of chest tightness, cough, shortness of breath and wheezing. This is a chronic problem. The current episode started more than 1 year ago. Associated symptoms include heartburn. Pertinent negatives include no malaise/fatigue. His past medical history is significant for asthma.  Gastroesophageal Reflux  He complains of coughing, heartburn and wheezing. He reports no belching. This is a chronic problem. The current episode started more than 1 year ago. The problem occurs occasionally. The problem has been resolved. Pertinent negatives include no fatigue. He has tried a PPI for the symptoms. The treatment provided moderate relief.  Thyroid Problem  Presents for follow-up visit. Symptoms include anxiety, palpitations and visual change. Patient reports no constipation, depressed mood, diaphoresis or fatigue. The symptoms have been stable. His past medical history is significant for diabetes and hyperlipidemia.  Hyperlipidemia  This is a chronic problem. The current episode started more than 1 year ago. Recent lipid tests were reviewed and are normal. Exacerbating diseases include diabetes and hypothyroidism. Associated symptoms include shortness of breath. The current treatment provides mild improvement of lipids.  Back Pain  This is a chronic problem. The current episode started more than 1 year ago. The problem occurs intermittently. The problem has been waxing and waning since onset. The pain is present in the lumbar spine. The pain is at a severity of 9/10.  Insomnia  Primary symptoms: difficulty falling asleep, frequent awakening, no malaise/fatigue.  The current episode started more than one  year. The onset quality is gradual. The problem occurs intermittently. The problem  has been waxing and waning since onset. The symptoms are aggravated by tobacco. PMH includes: depression.  Depression         This is a chronic problem.  The current episode started more than 1 year ago.   The onset quality is gradual.   The problem occurs intermittently.  The problem has been waxing and waning since onset.  Associated symptoms include insomnia, irritable, restlessness, decreased interest and sad.  Associated symptoms include no fatigue.  Past treatments include SSRIs - Selective serotonin reuptake inhibitors.  Compliance with treatment is good.  Past medical history includes hypothyroidism, thyroid problem and anxiety.   Anxiety  Presents for follow-up visit. Symptoms include excessive worry, insomnia, nervous/anxious behavior, palpitations, restlessness and shortness of breath. Patient reports no depressed mood. Symptoms occur occasionally.   His past medical history is significant for asthma.      Review of Systems  Constitutional: Negative for diaphoresis, fatigue and malaise/fatigue.  Eyes: Positive for blurred vision.  Respiratory: Positive for cough, shortness of breath and wheezing.   Cardiovascular: Positive for palpitations.  Gastrointestinal: Positive for heartburn. Negative for constipation.  Musculoskeletal: Positive for back pain.  Psychiatric/Behavioral: Positive for depression. The patient is nervous/anxious and has insomnia.   All other systems reviewed and are negative.      Observations/Objective: Happy, no SOB or distress noted  Assessment and Plan: Charles Pennington comes in today with chief complaint of No chief complaint on file.   Diagnosis and orders addressed:  1. Intermittent asthma without complication, unspecified asthma severity  2. Chronic low back pain with sciatica, sciatica laterality unspecified, unspecified back pain laterality  3. Moderate  episode of recurrent major depressive disorder (HCC) - PARoxetine (PAXIL) 40 MG tablet; Take 1 tablet (40 mg total) by mouth daily.  Dispense: 90 tablet; Refill: 1  4. Diabetic polyneuropathy associated with type 2 diabetes mellitus (HCC)  5. GAD (generalized anxiety disorder) - PARoxetine (PAXIL) 40 MG tablet; Take 1 tablet (40 mg total) by mouth daily.  Dispense: 90 tablet; Refill: 1  6. Gastroesophageal reflux disease, esophagitis presence not specified  7. History of CVA (cerebrovascular accident)  8. Hyperlipidemia associated with type 2 diabetes mellitus (HCC)  9. Hypertension associated with diabetes (HCC)   10. Hypothyroidism, unspecified type  11. Insomnia, unspecified type - zolpidem (AMBIEN) 10 MG tablet; Take 1 tablet (10 mg total) by mouth at bedtime.  Dispense: 30 tablet; Refill: 2  12. Morbid obesity (HCC)  13. Noncompliance with medications  14. Uncomplicated opioid dependence (HCC)  15. SMOKER  16. Type 2 diabetes mellitus treated with insulin (HCC) - Dulaglutide (TRULICITY) 1.5 MG/0.5ML SOPN; INJECT 1.5 MG SUB-Q ONCE A WEEK  Dispense: 12 pen; Refill: 6   Discussed medications in detail. States he is taking them.  Pt reviewed in [place] controlled database- Pt does go to pain clinic and get Ambien and Lyrica from me Health Maintenance reviewed Diet and exercise encouraged  Follow up plan: 1 month for lab work       I discussed the assessment and treatment plan with the patient. The patient was provided an opportunity to ask questions and all were answered. The patient agreed with the plan and demonstrated an understanding of the instructions.   The patient was advised to call back or seek an in-person evaluation if the symptoms worsen or if the condition fails to improve as anticipated.  The above assessment and management plan was discussed with the patient. The patient verbalized understanding of and has agreed  to the management plan. Patient is aware  to call the clinic if symptoms persist or worsen. Patient is aware when to return to the clinic for a follow-up visit. Patient educated on when it is appropriate to go to the emergency department.    Call ended 9:12 AM, I provided 20 minutes of non-face-to-face time during this encounter.    [name], FNP

## 2018-06-01 DIAGNOSIS — M5136 Other intervertebral disc degeneration, lumbar region: Secondary | ICD-10-CM | POA: Diagnosis not present

## 2018-06-01 DIAGNOSIS — G894 Chronic pain syndrome: Secondary | ICD-10-CM | POA: Diagnosis not present

## 2018-06-01 DIAGNOSIS — Z79899 Other long term (current) drug therapy: Secondary | ICD-10-CM | POA: Diagnosis not present

## 2018-06-12 ENCOUNTER — Ambulatory Visit: Payer: Self-pay | Admitting: *Deleted

## 2018-06-12 NOTE — Chronic Care Management (AMB) (Signed)
  Chronic Care Management   Outreach Note  06/12/2018 Name: Charles Pennington MRN: [id number] DOB: 07-29-1965  Referred by: Junie Spencer, FNP Reason for referral : Chronic Care Management (Initial Outreach)   An unsuccessful telephone outreach was attempted today. The patient was referred to the case management team for assistance with chronic care management/care coordination. I was able to reach the patient but we had technical difficulty and the call was not completed.  Follow Up Plan: The CM team will reach out to the patient again over the next 7 days.   Marja Kays [facility] [place] Care Manager  [facility]  [phone]

## 2018-06-23 ENCOUNTER — Other Ambulatory Visit: Payer: Self-pay | Admitting: Family

## 2018-06-23 DIAGNOSIS — K219 Gastro-esophageal reflux disease without esophagitis: Secondary | ICD-10-CM

## 2018-06-29 DIAGNOSIS — M5136 Other intervertebral disc degeneration, lumbar region: Secondary | ICD-10-CM | POA: Diagnosis not present

## 2018-06-29 DIAGNOSIS — Z79899 Other long term (current) drug therapy: Secondary | ICD-10-CM | POA: Diagnosis not present

## 2018-06-29 DIAGNOSIS — G894 Chronic pain syndrome: Secondary | ICD-10-CM | POA: Diagnosis not present

## 2018-06-30 ENCOUNTER — Other Ambulatory Visit: Payer: Self-pay

## 2018-06-30 ENCOUNTER — Ambulatory Visit (INDEPENDENT_AMBULATORY_CARE_PROVIDER_SITE_OTHER): Payer: Medicare Other | Admitting: Family

## 2018-06-30 ENCOUNTER — Encounter: Payer: Self-pay | Admitting: Family

## 2018-06-30 VITALS — BP 131/90 | HR 96 | Temp 97.4°F | Ht 68.0 in | Wt 247.0 lb

## 2018-06-30 DIAGNOSIS — E1159 Type 2 diabetes mellitus with other circulatory complications: Secondary | ICD-10-CM

## 2018-06-30 DIAGNOSIS — I69993 Ataxia following unspecified cerebrovascular disease: Secondary | ICD-10-CM

## 2018-06-30 DIAGNOSIS — Z91148 Patient's other noncompliance with medication regimen for other reason: Secondary | ICD-10-CM

## 2018-06-30 DIAGNOSIS — E039 Hypothyroidism, unspecified: Secondary | ICD-10-CM | POA: Diagnosis not present

## 2018-06-30 DIAGNOSIS — I152 Hypertension secondary to endocrine disorders: Secondary | ICD-10-CM

## 2018-06-30 DIAGNOSIS — E1142 Type 2 diabetes mellitus with diabetic polyneuropathy: Secondary | ICD-10-CM | POA: Diagnosis not present

## 2018-06-30 DIAGNOSIS — J4520 Mild intermittent asthma, uncomplicated: Secondary | ICD-10-CM

## 2018-06-30 DIAGNOSIS — G8929 Other chronic pain: Secondary | ICD-10-CM

## 2018-06-30 DIAGNOSIS — M5440 Lumbago with sciatica, unspecified side: Secondary | ICD-10-CM | POA: Diagnosis not present

## 2018-06-30 DIAGNOSIS — F411 Generalized anxiety disorder: Secondary | ICD-10-CM | POA: Diagnosis not present

## 2018-06-30 DIAGNOSIS — Z8673 Personal history of transient ischemic attack (TIA), and cerebral infarction without residual deficits: Secondary | ICD-10-CM

## 2018-06-30 DIAGNOSIS — F17200 Nicotine dependence, unspecified, uncomplicated: Secondary | ICD-10-CM | POA: Diagnosis not present

## 2018-06-30 DIAGNOSIS — Z794 Long term (current) use of insulin: Secondary | ICD-10-CM

## 2018-06-30 DIAGNOSIS — F331 Major depressive disorder, recurrent, moderate: Secondary | ICD-10-CM

## 2018-06-30 DIAGNOSIS — E785 Hyperlipidemia, unspecified: Secondary | ICD-10-CM

## 2018-06-30 DIAGNOSIS — Z9114 Patient's other noncompliance with medication regimen: Secondary | ICD-10-CM

## 2018-06-30 DIAGNOSIS — E119 Type 2 diabetes mellitus without complications: Secondary | ICD-10-CM | POA: Diagnosis not present

## 2018-06-30 DIAGNOSIS — E1169 Type 2 diabetes mellitus with other specified complication: Secondary | ICD-10-CM

## 2018-06-30 DIAGNOSIS — I10 Essential (primary) hypertension: Secondary | ICD-10-CM

## 2018-06-30 LAB — BAYER DCA HB A1C WAIVED: HB A1C (BAYER DCA - WAIVED): 13.8 % — ABNORMAL HIGH (ref <7.0)

## 2018-06-30 NOTE — Progress Notes (Signed)
Subjective:    Patient ID: Charles Pennington, male    DOB: Jul 14, 1965, 53 y.o.   MRN: [id number]  Chief Complaint  Patient presents with   Medical Management of Chronic Issues   Diabetes   Pt presents to the office today for chronic follow up.Pt had a CVA when he turned 31 years. PT states he continue to have "balance issues related to the CVA". PT states this is stable.   Pt is noncompliant with his medications and takes "most of his pills most days".   He is followed by Pain Clinic every month for chronic back pain and diabetic neuropathy.   He is followed by Ortho for chronic back pain.  Diabetes  He presents for his follow-up diabetic visit. He has type 2 diabetes mellitus. His disease course has been stable. Hypoglycemia symptoms include nervousness/anxiousness. Associated symptoms include fatigue and foot paresthesias. Pertinent negatives for diabetes include no blurred vision. There are no hypoglycemic complications. Symptoms are stable. Diabetic complications include a CVA, heart disease, nephropathy and peripheral neuropathy. Risk factors for coronary artery disease include dyslipidemia, diabetes mellitus, hypertension, sedentary lifestyle and post-menopausal. He is following a generally unhealthy diet. His overall blood glucose range is >200 mg/dl. Eye exam is current.  Hypertension  This is a chronic problem. The current episode started more than 1 year ago. The problem has been resolved since onset. The problem is controlled. Associated symptoms include anxiety. Pertinent negatives include no blurred vision, malaise/fatigue, peripheral edema or shortness of breath. Risk factors for coronary artery disease include dyslipidemia, obesity, male gender, diabetes mellitus, smoking/tobacco exposure and sedentary lifestyle. The current treatment provides moderate improvement. Hypertensive end-organ damage includes CAD/MI, CVA and heart failure. Identifiable causes of hypertension  include a thyroid problem.  Gastroesophageal Reflux  He complains of belching, coughing, heartburn and wheezing. This is a chronic problem. The current episode started more than 1 year ago. The problem occurs occasionally. The problem has been waxing and waning. Associated symptoms include fatigue. He has tried a PPI for the symptoms. The treatment provided moderate relief.  Thyroid Problem  Presents for follow-up visit. Symptoms include anxiety, constipation, depressed mood and fatigue. The symptoms have been stable. His past medical history is significant for heart failure and hyperlipidemia.  Hyperlipidemia  This is a chronic problem. The current episode started more than 1 year ago. The problem is uncontrolled. Recent lipid tests were reviewed and are high. Exacerbating diseases include obesity. Pertinent negatives include no shortness of breath. Current antihyperlipidemic treatment includes statins. The current treatment provides moderate improvement of lipids. Risk factors for coronary artery disease include dyslipidemia, diabetes mellitus, male sex, hypertension and a sedentary lifestyle.  Asthma  He complains of cough and wheezing. There is no shortness of breath. This is a chronic problem. The current episode started more than 1 year ago. The problem occurs intermittently. The problem has been waxing and waning. Associated symptoms include heartburn. Pertinent negatives include no malaise/fatigue. He reports moderate improvement on treatment. His past medical history is significant for asthma.  Anxiety  Presents for follow-up visit. Symptoms include decreased concentration, depressed mood, excessive worry, irritability, nervous/anxious behavior and restlessness. Patient reports no shortness of breath. Symptoms occur occasionally. The severity of symptoms is moderate.   His past medical history is significant for asthma.  Depression         This is a chronic problem.  The current episode  started more than 1 year ago.   The onset quality is gradual.  The problem occurs intermittently.  The problem has been waxing and waning since onset.  Associated symptoms include decreased concentration, fatigue, irritable, restlessness and sad.  Associated symptoms include no helplessness and no hopelessness.  Past medical history includes thyroid problem and anxiety.       Review of Systems  Constitutional: Positive for fatigue and irritability. Negative for malaise/fatigue.  Eyes: Negative for blurred vision.  Respiratory: Positive for cough and wheezing. Negative for shortness of breath.   Gastrointestinal: Positive for constipation and heartburn.  Psychiatric/Behavioral: Positive for decreased concentration and depression. The patient is nervous/anxious.   All other systems reviewed and are negative.      Objective:   Physical Exam Vitals signs reviewed.  Constitutional:      General: [name]. He is not in acute distress.    Appearance: He is well-developed.  HENT:     Head: Normocephalic.     Right Ear: Tympanic membrane normal.     Left Ear: Tympanic membrane normal.  Eyes:     General:        Right eye: No discharge.        Left eye: No discharge.     Pupils: Pupils are equal, round, and reactive to light.  Neck:     Musculoskeletal: Normal range of motion and neck supple.     Thyroid: No thyromegaly.  Cardiovascular:     Rate and Rhythm: Normal rate and regular rhythm.     Heart sounds: Normal heart sounds. No murmur.  Pulmonary:     Effort: Pulmonary effort is normal. No respiratory distress.     Breath sounds: Rhonchi present. No wheezing.  Abdominal:     General: Bowel sounds are normal. There is no distension.     Palpations: Abdomen is soft.     Tenderness: There is no abdominal tenderness.  Musculoskeletal: Normal range of motion.        General: No tenderness.  Skin:    General: Skin is warm and dry.     Findings: No erythema or rash.    Neurological:     Mental Status: He is alert and oriented to person, place, and time.     Cranial Nerves: No cranial nerve deficit.     Deep Tendon Reflexes: Reflexes are normal and symmetric.  Psychiatric:        Behavior: Behavior normal.        Thought Content: Thought content normal.        Judgment: Judgment normal.       BP 131/90    Pulse 96    Temp (!) 97.4 F (36.3 C) (Oral)    Ht 5' 8"  (1.727 m)    Wt 247 lb (112 kg)    BMI 37.56 kg/m      Assessment & Plan:  Charles Pennington comes in today with chief complaint of Medical Management of Chronic Issues and Diabetes   Diagnosis and orders addressed:  1. Type 2 diabetes mellitus treated with insulin (HCC) - TSH - BMP8+EGFR - Bayer DCA Hb A1c Waived  2. Hypothyroidism, unspecified type - TSH - BMP8+EGFR - Bayer DCA Hb A1c Waived  3. Intermittent asthma without complication, unspecified asthma severity  4. Ataxia, late effect of cerebrovascular disease  5. Chronic low back pain with sciatica, sciatica laterality unspecified, unspecified back pain laterality  6. Moderate episode of recurrent major depressive disorder ([place])  7. Diabetic polyneuropathy associated with type 2 diabetes mellitus ([place])  8. GAD (generalized anxiety disorder)  9. History of CVA (cerebrovascular accident)  10. Noncompliance with medications  11. SMOKER -Smoking cessation discussed  12. Morbid obesity ([place])  13. Hypertension associated with diabetes ([place])  14. Hyperlipidemia associated with type 2 diabetes mellitus ([place])   Labs pending Health Maintenance reviewed Diet and exercise encouraged  Follow up plan: 3 month    Evelina Dun, FNP

## 2018-06-30 NOTE — Patient Instructions (Signed)

## 2018-07-01 LAB — BMP8+EGFR
BUN/Creatinine Ratio: 16 (ref 9–20)
BUN: 16 mg/dL (ref 6–24)
CO2: 25 mmol/L (ref 20–29)
Calcium: 9.6 mg/dL (ref 8.7–10.2)
Chloride: 97 mmol/L (ref 96–106)
Creatinine, Ser: 1.03 mg/dL (ref 0.76–1.27)
GFR calc Af Amer: 96 mL/min/{1.73_m2} (ref >59)
GFR calc non Af Amer: 83 mL/min/{1.73_m2} (ref >59)
Glucose: 394 mg/dL — ABNORMAL HIGH (ref 65–99)
Potassium: 4.2 mmol/L (ref 3.5–5.2)
Sodium: 136 mmol/L (ref 134–144)

## 2018-07-01 LAB — TSH: TSH: 0.879 u[IU]/mL (ref 0.450–4.500)

## 2018-07-03 ENCOUNTER — Other Ambulatory Visit: Payer: Self-pay | Admitting: Family

## 2018-07-03 MED ORDER — DAPAGLIFLOZIN PROPANEDIOL 5 MG PO TABS: 5.0000 mg | ORAL_TABLET | Freq: Every day | ORAL | 2 refills | Status: DC

## 2018-07-13 ENCOUNTER — Telehealth: Payer: Self-pay | Admitting: Family

## 2018-07-13 NOTE — Telephone Encounter (Signed)
Gave pt [employer] co number off ppw that we signed last year [phone] Paperwork for this has been faxed last week

## 2018-07-19 ENCOUNTER — Telehealth: Payer: Self-pay | Admitting: Family

## 2018-07-26 ENCOUNTER — Other Ambulatory Visit: Payer: Self-pay | Admitting: Family

## 2018-07-27 DIAGNOSIS — M5136 Other intervertebral disc degeneration, lumbar region: Secondary | ICD-10-CM | POA: Diagnosis not present

## 2018-07-27 DIAGNOSIS — G894 Chronic pain syndrome: Secondary | ICD-10-CM | POA: Diagnosis not present

## 2018-07-27 DIAGNOSIS — Z79899 Other long term (current) drug therapy: Secondary | ICD-10-CM | POA: Diagnosis not present

## 2018-07-28 ENCOUNTER — Other Ambulatory Visit: Payer: Self-pay | Admitting: Family

## 2018-07-31 ENCOUNTER — Other Ambulatory Visit: Payer: Self-pay | Admitting: Family

## 2018-08-01 DIAGNOSIS — M79671 Pain in right foot: Secondary | ICD-10-CM | POA: Diagnosis not present

## 2018-08-01 DIAGNOSIS — M7741 Metatarsalgia, right foot: Secondary | ICD-10-CM | POA: Diagnosis not present

## 2018-08-02 ENCOUNTER — Telehealth: Payer: Self-pay | Admitting: Family

## 2018-08-02 NOTE — Telephone Encounter (Signed)
Pt states the company will be faxing papers over for [place] to fill out for him to get the continuous monitor. Advised pt when we get them we will give them to [place].

## 2018-08-02 NOTE — Telephone Encounter (Signed)
Pt is wanting to let Alyse Low know that someone called him about a meter you put on his arm to track his BS he can't remember the name of place but the phone number was [phone]

## 2018-08-03 ENCOUNTER — Telehealth: Payer: Self-pay | Admitting: Family

## 2018-08-04 NOTE — Telephone Encounter (Signed)
Paperwork rcvd from [employer] & placed on providers desk

## 2018-08-07 ENCOUNTER — Telehealth: Payer: Self-pay | Admitting: Family

## 2018-08-07 ENCOUNTER — Other Ambulatory Visit: Payer: Self-pay

## 2018-08-07 ENCOUNTER — Ambulatory Visit (INDEPENDENT_AMBULATORY_CARE_PROVIDER_SITE_OTHER): Payer: Medicare Other | Admitting: *Deleted

## 2018-08-07 VITALS — Ht 68.0 in | Wt 247.0 lb

## 2018-08-07 DIAGNOSIS — E119 Type 2 diabetes mellitus without complications: Secondary | ICD-10-CM

## 2018-08-07 DIAGNOSIS — Z794 Long term (current) use of insulin: Secondary | ICD-10-CM | POA: Diagnosis not present

## 2018-08-07 DIAGNOSIS — Z0000 Encounter for general adult medical examination without abnormal findings: Secondary | ICD-10-CM | POA: Diagnosis not present

## 2018-08-07 NOTE — Progress Notes (Signed)
MEDICARE ANNUAL WELLNESS VISIT  08/07/2018  Telephone Visit Disclaimer This Medicare AWV was conducted by telephone due to national recommendations for restrictions regarding the COVID-19 Pandemic (e.g. social distancing).  I verified, using two identifiers, that I am speaking with Charles Pennington or their authorized healthcare agent. I discussed the limitations, risks, security, and privacy concerns of performing an evaluation and management service by telephone and the potential availability of an in-person appointment in the future. The patient expressed understanding and agreed to proceed.   Subjective:  Charles Pennington is a 53 y.o. male patient of [name], Charles Hawthorne, FNP who had a Medicare Annual Wellness Visit today via telephone. [name] is disabled and lives alone. he has 2 children. he reports that he is socially active and does interact with friends/family regularly. he is not physically active and enjoys fishing.  Patient Care Team: Sharion Balloon, FNP as PCP - General (Family Medicine) Sharion Balloon, FNP (Nurse Practitioner)  Advanced Directives 08/07/2018 11/29/2017 07/01/2016 05/12/2016 05/07/2016 04/28/2016 11/06/2015  Does Patient Have a Medical Advance Directive? No No No - No No No  Would patient like information on creating a medical advance directive? Yes (MAU/Ambulatory/Procedural Areas - Information given) Yes (ED - Information included in AVS) No - Patient declined No - Patient declined No - Patient declined No - Patient declined No - patient declined information  Some encounter information is confidential and restricted. Go to Review Flowsheets activity to see all data.    Hospital Utilization Over the Past 12 Months: # of hospitalizations or ER visits: 1 # of surgeries: 0  Review of Systems    Patient reports that his overall health is unchanged compared to last year.  Patient Reported Readings (BP, Pulse, CBG, Weight, etc) none  Review of Systems:  History obtained from chart review and the patient General ROS: positive for  - Back pain  All other systems negative.  Pain Assessment Pain : 0-10 Pain Score: 8  Pain Type: Chronic pain Pain Location: Back Pain Orientation: Lower Pain Radiating Towards: Right leg Pain Descriptors / Indicators: Constant, Throbbing Pain Onset: More than a month ago Pain Frequency: Constant Pain Relieving Factors: Hydrocodone  Pain Relieving Factors: Hydrocodone  Current Medications & Allergies (verified) Allergies as of 08/07/2018      Reactions   Hydrocodone-acetaminophen Itching, Other (See Comments)   Pt is able to tolerate with Benadryl.    Gadolinium Derivatives Nausea And Vomiting      Medication List       Accurate as of August 07, 2018  8:52 AM. If you have any questions, ask your nurse or doctor.        albuterol 108 (90 Base) MCG/ACT inhaler Commonly known as: VENTOLIN HFA 2 PUFFS INTO THE LUNGS EVERY 6 (SIX) HOURS AS NEEDED FOR WHEEZING.   albuterol (2.5 MG/3ML) 0.083% nebulizer solution Commonly known as: PROVENTIL Take 3 mLs (2.5 mg total) by nebulization every 6 (six) hours as needed for wheezing or shortness of breath.   amLODipine 5 MG tablet Commonly known as: NORVASC TAKE 1 TABLET (5 MG TOTAL) BY MOUTH DAILY.   aspirin EC 81 MG tablet Take 1 tablet (81 mg total) by mouth daily.   atorvastatin 80 MG tablet Commonly known as: LIPITOR TAKE 1 TABLET ONCE A DAY   baclofen 10 MG tablet Commonly known as: LIORESAL   Belbuca 450 MCG Film Generic drug: Buprenorphine HCl   blood glucose meter kit and supplies Dispense based on patient and  insurance preference. Test sugar BID and as needed   Contour Next Test test strip Generic drug: glucose blood CHECK BLOOD SUGAR UP TO TWICE DAILY   dapagliflozin propanediol 5 MG Tabs tablet Commonly known as: Farxiga Take 5 mg by mouth daily.   Dulaglutide 1.5 MG/0.5ML Sopn Commonly known as: Trulicity INJECT 1.5 MG SUB-Q  ONCE A WEEK   fenofibrate 160 MG tablet TAKE 1 TABLET (160 MG TOTAL) BY MOUTH DAILY.   Fluticasone-Umeclidin-Vilant 100-62.5-25 MCG/INH Aepb Commonly known as: Trelegy Ellipta Inhale 1 puff into the lungs daily.   HYDROcodone-acetaminophen 5-325 MG tablet Commonly known as: NORCO/VICODIN   ibuprofen 800 MG tablet Commonly known as: ADVIL   levothyroxine 175 MCG tablet Commonly known as: SYNTHROID TAKE (1) TABLET DAILY BE- FORE BREAKFAST.   lisinopril 20 MG tablet Commonly known as: ZESTRIL Take 20 mg by mouth daily.   metFORMIN 750 MG 24 hr tablet Commonly known as: Glucophage XR Take 2 tablets (1,500 mg total) by mouth daily with breakfast.   omeprazole 20 MG capsule Commonly known as: PRILOSEC TAKE (1) CAPSULE DAILY   PARoxetine 40 MG tablet Commonly known as: PAXIL Take 1 tablet (40 mg total) by mouth daily.   pregabalin 150 MG capsule Commonly known as: LYRICA TAKE 2 CAPSULES EVERY MORNING AND 2 CAPSULES EVERY EVENING   Tresiba FlexTouch 200 UNIT/ML Sopn Generic drug: Insulin Degludec Inject 80-90 Units into the skin daily. (Needs to be seen)   UltiCare Micro Pen Needles 32G X 4 MM Misc Generic drug: Insulin Pen Needle Use 2 times a day To test blood glucose   zolpidem 10 MG tablet Commonly known as: AMBIEN Take 1 tablet (10 mg total) by mouth at bedtime.       History (reviewed): Past Medical History:  Diagnosis Date  . Anxiety   . Asthma   . Diabetes mellitus   . Diabetes mellitus without complication ([place])   . Hypercholesteremia   . Hypertension   . Hypothyroidism   . Low back pain   . Obesity   . SOB (shortness of breath)   . Stroke [facility]) age 35  . Stroke ([place])   . Vertigo    Past Surgical History:  Procedure Laterality Date  . BACK SURGERY  2013  . COLONOSCOPY  2009   Inflammatory changes of the cecum and ascending colon most consistent with infectious etiology, NSAID, ischemia. Suspected resolving infection based on symptomatology.   [facility] FOOT SURGERY     Dr Irving Shows  . SHOULDER ARTHROSCOPY Right   . SPINE SURGERY    . WRIST SURGERY     Family History  Problem Relation Age of Onset  . Hypertension Father   . Suicidality Father   . Diabetes Father   . Diabetes Mother   . Colon cancer Neg Hx   . Inflammatory bowel disease Neg Hx    Social History   Socioeconomic History  . Marital status: Single    Spouse name: Not on file  . Number of children: 2  . Years of education: Not on file  . Highest education level: 8th grade  Occupational History  . Occupation: disabled Clinical biochemist  Social Needs  . Financial resource strain: Not hard at all  . Food insecurity    Worry: Never true    Inability: Never true  . Transportation needs    Medical: No    Non-medical: No  Tobacco Use  . Smoking status: Current Every Day Smoker    Packs/day: 0.50    Years:  30.00    Pack years: 15.00    Types: Cigarettes  . Smokeless tobacco: Never Used  Substance and Sexual Activity  . Alcohol use: No    Comment: occasional per pt  . Drug use: No  . Sexual activity: Yes  Lifestyle  . Physical activity    Days per week: 0 days    Minutes per session: 0 min  . Stress: Not at all  Relationships  . Social connections    Talks on phone: More than three times a week    Gets together: More than three times a week    Attends religious service: Never    Active member of club or organization: No    Attends meetings of clubs or organizations: Never    Relationship status: Never married  Other Topics Concern  . Not on file  Social History Narrative   ** Merged History Encounter **        Activities of Daily Living In your present state of health, do you have any difficulty performing the following activities: 08/07/2018  Hearing? N  Vision? N  Difficulty concentrating or making decisions? Y  Walking or climbing stairs? Y  Dressing or bathing? N  Doing errands, shopping? N  Preparing Food and eating ? N  Using the Toilet? N   In the past six months, have you accidently leaked urine? N  Do you have problems with loss of bowel control? N  Managing your Medications? N  Managing your Finances? N  Housekeeping or managing your Housekeeping? N  Some recent data might be hidden    Patient Literacy How often do you need to have someone help you when you read instructions, pamphlets, or other written materials from your doctor or pharmacy?: 1 - Never What is the last grade level you completed in school?: 8th Grade  Exercise Current Exercise Habits: The patient does not participate in regular exercise at present, Exercise limited by: orthopedic condition(s)  Diet Patient reports consuming 4 meals a day and 4 snack(s) a day Patient reports that his primary diet is: Regular Patient reports that she does have regular access to food.   Depression Screen PHQ 2/9 Scores 08/07/2018 06/30/2018 03/06/2018 03/02/2018 02/22/2018 01/30/2018 01/26/2018  PHQ - 2 Score 0 0 0 0 0 0 0  PHQ- 9 Score - - - - - - -     Fall Risk Fall Risk  08/07/2018 09/01/2017 03/25/2017 01/21/2017 11/24/2016  Falls in the past year? 1 No No Yes Yes  Number falls in past yr: 0 - - 1 1  Injury with Fall? 0 - - Yes Yes  Comment - - - - -  Risk Factor Category  - - - - -  Risk for fall due to : Impaired balance/gait - - - -     Objective:  Charles Pennington seemed alert and oriented and he participated appropriately during our telephone visit.  Blood Pressure Weight BMI  BP Readings from Last 3 Encounters:  06/30/18 131/90  03/06/18 129/90  03/02/18 106/71   Wt Readings from Last 3 Encounters:  08/07/18 247 lb (112 kg)  06/30/18 247 lb (112 kg)  03/06/18 245 lb 12.8 oz (111.5 kg)   BMI Readings from Last 1 Encounters:  08/07/18 37.56 kg/m    *Unable to obtain current vital signs, weight, and BMI due to telephone visit type  Hearing/Vision  . Chen did  seem to have difficulty with hearing/understanding during the telephone conversation .  Reports that  he has had a formal eye exam by an eye care professional within the past year . Reports that he has not had a formal hearing evaluation within the past year *Unable to fully assess hearing and vision during telephone visit type  Cognitive Function: 6CIT Screen 08/07/2018  What Year? 0 points  What month? 0 points  What time? 0 points  Count back from 20 0 points  Months in reverse 4 points  Repeat phrase 0 points  Total Score 4   (Normal:0-7, Significant for Dysfunction: >8)  Normal Cognitive Function Screening: Yes   Immunization & Health Maintenance Record Immunization History  Administered Date(s) Administered  . Influenza,inj,Quad PF,6+ Mos 03/15/2014, 01/26/2018  . Influenza-Unspecified 03/23/2017  . Pneumococcal Conjugate-13 12/05/2014  . Tdap 04/04/2016    Health Maintenance  Topic Date Due  . PNEUMOCOCCAL POLYSACCHARIDE VACCINE AGE 43-64 HIGH RISK  06/08/1968  . COLONOSCOPY  06/08/2016  . FOOT EXAM  03/25/2018  . INFLUENZA VACCINE  09/16/2018  . HEMOGLOBIN A1C  12/31/2018  . OPHTHALMOLOGY EXAM  05/02/2019  . TETANUS/TDAP  04/04/2026  . HIV Screening  Completed       Assessment  This is a routine wellness examination for NATTHEW MARLATT.  Health Maintenance: Due or Overdue Health Maintenance Due  Topic Date Due  . PNEUMOCOCCAL POLYSACCHARIDE VACCINE AGE 43-64 HIGH RISK  06/08/1968  . COLONOSCOPY  06/08/2016  . FOOT EXAM  03/25/2018    Charles Pennington does not need a referral for Community Assistance: Care Management:   no Social Work:    yes Prescription Assistance:  no Nutrition/Diabetes Education:  no   Plan:  Personalized Goals Goals Addressed            This Visit's Progress   . Exercise 3x per week (30 min per time)       Try to exercise for at least 30 minutes, 3 times weekly;      Personalized Health Maintenance & Screening Recommendations  Pneumococcal vaccine  Colorectal cancer screening Diabetes screening  Smoking cessation counseling Advanced directives: has NO advanced directive  - add't info requested. Referral to SW: no Shingrix  Lung Cancer Screening Recommended: yes (Low Dose CT Chest recommended if Age 7-80 years, 30 pack-year currently smoking OR have quit w/in past 15 years) Hepatitis C Screening recommended: no HIV Screening recommended: yes  Advanced Directives: Written information was prepared per patient's request.  Referrals & Orders No orders of the defined types were placed in this encounter.   Follow-up Plan . Follow-up with Sharion Balloon, FNP as planned    I have personally reviewed and noted the following in the patient's chart:   . Medical and social history . Use of alcohol, tobacco or illicit drugs  . Current medications and supplements . Functional ability and status . Nutritional status . Physical activity . Advanced directives . List of other physicians . Hospitalizations, surgeries, and ER visits in previous 12 months . Vitals . Screenings to include cognitive, depression, and falls . Referrals and appointments  In addition, I have reviewed and discussed with Charles Pennington certain preventive protocols, quality metrics, and best practice recommendations. A written personalized care plan for preventive services as well as general preventive health recommendations is available and can be mailed to the patient at his request.      Wardell Heath, LPN  08/06/2977     I have reviewed and agree with the above AWV documentation.   Evelina Dun, FNP

## 2018-08-07 NOTE — Patient Instructions (Addendum)
Charles Pennington , Thank you for taking time to come for your Medicare Wellness Visit. I appreciate your ongoing commitment to your health goals. Please review the following plan we discussed and let me know if I can assist you in the future.   These are the goals we discussed: Goals    . Exercise 3x per week (30 min per time)     Try to exercise for at least 30 minutes, 3 times weekly;       This is a list of the screening recommended for you and due dates:  Health Maintenance  Topic Date Due  . Pneumococcal vaccine  06/08/1968  . Colon Cancer Screening  06/08/2016  . Complete foot exam   03/25/2018  . Flu Shot  09/16/2018  . Hemoglobin A1C  12/31/2018  . Eye exam for diabetics  05/02/2019  . Tetanus Vaccine  04/04/2026  . HIV Screening  Completed  .  Advance Directive  Advance directives are legal documents that let you make choices ahead of time about your health care and medical treatment in case you become unable to communicate for yourself. Advance directives are a way for you to communicate your wishes to family, friends, and health care providers. This can help convey your decisions about end-of-life care if you become unable to communicate. Discussing and writing advance directives should happen over time rather than all at once. Advance directives can be changed depending on your situation and what you want, even after you have signed the advance directives. If you do not have an advance directive, some states assign family decision makers to act on your behalf based on how closely you are related to them. Each state has its own laws regarding advance directives. You may want to check with your health care provider, attorney, or state representative about the laws in your state. There are different types of advance directives, such as:  Medical power of attorney.  Living will.  Do not resuscitate (DNR) or do not attempt resuscitation (DNAR) order. Health care proxy and  medical power of attorney A health care proxy, also called a health care agent, is a person who is appointed to make medical decisions for you in cases in which you are unable to make the decisions yourself. Generally, people choose someone they know well and trust to represent their preferences. Make sure to ask this person for an agreement to act as your proxy. A proxy may have to exercise judgment in the event of a medical decision for which your wishes are not known. A medical power of attorney is a legal document that names your health care proxy. Depending on the laws in your state, after the document is written, it may also need to be:  Signed.  Notarized.  Dated.  Copied.  Witnessed.  Incorporated into your medical record. You may also want to appoint someone to manage your financial affairs in a situation in which you are unable to do so. This is called a durable power of attorney for finances. It is a separate legal document from the durable power of attorney for health care. You may choose the same person or someone different from your health care proxy to act as your agent in financial matters. If you do not appoint a proxy, or if there is a concern that the proxy is not acting in your best interests, a court-appointed guardian may be designated to act on your behalf. Living will A living will is a set of  instructions documenting your wishes about medical care when you cannot express them yourself. Health care providers should keep a copy of your living will in your medical record. You may want to give a copy to family members or friends. To alert caregivers in case of an emergency, you can place a card in your wallet to let them know that you have a living will and where they can find it. A living will is used if you become:  Terminally ill.  Incapacitated.  Unable to communicate or make decisions. Items to consider in your living will include:  The use or non-use of  life-sustaining equipment, such as dialysis machines and breathing machines (ventilators).  A DNR or DNAR order, which is the instruction not to use cardiopulmonary resuscitation (CPR) if breathing or heartbeat stops.  The use or non-use of tube feeding.  Withholding of food and fluids.  Comfort (palliative) care when the goal becomes comfort rather than a cure.  Organ and tissue donation. A living will does not give instructions for distributing your money and property if you should pass away. It is recommended that you seek the advice of a lawyer when writing a will. Decisions about taxes, beneficiaries, and asset distribution will be legally binding. This process can relieve your family and friends of any concerns surrounding disputes or questions that may come up about the distribution of your assets. DNR or DNAR A DNR or DNAR order is a request not to have CPR in the event that your heart stops beating or you stop breathing. If a DNR or DNAR order has not been made and shared, a health care provider will try to help any patient whose heart has stopped or who has stopped breathing. If you plan to have surgery, talk with your health care provider about how your DNR or DNAR order will be followed if problems occur. Summary  Advance directives are the legal documents that allow you to make choices ahead of time about your health care and medical treatment in case you become unable to communicate for yourself.  The process of discussing and writing advance directives should happen over time. You can change the advance directives, even after you have signed them.  Advance directives include DNR or DNAR orders, living wills, and designating an agent as your medical power of attorney. This information is not intended to replace advice given to you by your health care provider. Make sure you discuss any questions you have with your health care provider. Document Released: 05/11/2007 Document  Revised: 12/22/2015 Document Reviewed: 12/22/2015 Elsevier Interactive Patient Education  2019 Elsevier Inc.   Back Exercises The following exercises strengthen the muscles that help to support the back. They also help to keep the lower back flexible. Doing these exercises can help to prevent back pain or lessen existing pain. If you have back pain or discomfort, try doing these exercises 2-3 times each day or as told by your health care provider. When the pain goes away, do them once each day, but increase the number of times that you repeat the steps for each exercise (do more repetitions). If you do not have back pain or discomfort, do these exercises once each day or as told by your health care provider. Exercises Single Knee to Chest Repeat these steps 3-5 times for each leg: 1. Lie on your back on a firm bed or the floor with your legs extended. 2. Bring one knee to your chest. Your other leg should stay extended  and in contact with the floor. 3. Hold your knee in place by grabbing your knee or thigh. 4. Pull on your knee until you feel a gentle stretch in your lower back. 5. Hold the stretch for 10-30 seconds. 6. Slowly release and straighten your leg. Pelvic Tilt Repeat these steps 5-10 times: 1. Lie on your back on a firm bed or the floor with your legs extended. 2. Bend your knees so they are pointing toward the ceiling and your feet are flat on the floor. 3. Tighten your lower abdominal muscles to press your lower back against the floor. This motion will tilt your pelvis so your tailbone points up toward the ceiling instead of pointing to your feet or the floor. 4. With gentle tension and even breathing, hold this position for 5-10 seconds. Cat-Cow Repeat these steps until your lower back becomes more flexible: 1. Get into a hands-and-knees position on a firm surface. Keep your hands under your shoulders, and keep your knees under your hips. You may place padding under your knees  for comfort. 2. Let your head hang down, and point your tailbone toward the floor so your lower back becomes rounded like the back of a cat. 3. Hold this position for 5 seconds. 4. Slowly lift your head and point your tailbone up toward the ceiling so your back forms a sagging arch like the back of a cow. 5. Hold this position for 5 seconds.  Press-Ups Repeat these steps 5-10 times: 1. Lie on your abdomen (face-down) on the floor. 2. Place your palms near your head, about shoulder-width apart. 3. While you keep your back as relaxed as possible and keep your hips on the floor, slowly straighten your arms to raise the top half of your body and lift your shoulders. Do not use your back muscles to raise your upper torso. You may adjust the placement of your hands to make yourself more comfortable. 4. Hold this position for 5 seconds while you keep your back relaxed. 5. Slowly return to lying flat on the floor.  Bridges Repeat these steps 10 times: 1. Lie on your back on a firm surface. Bend your knees so they are pointing toward  Steps to Quit Smoking  Smoking tobacco can be bad for your health. It can also affect almost every organ in your body. Smoking puts you and people around you at risk for many serious long-lasting (chronic) diseases. Quitting smoking is hard, but it is one of the best things that you can do for your health. It is never too late to quit. What are the benefits of quitting smoking? When you quit smoking, you lower your risk for getting serious diseases and conditions. They can include: Lung cancer or lung disease. Heart disease. Stroke. Heart attack. Not being able to have children (infertility). Weak bones (osteoporosis) and broken bones (fractures). If you have coughing, wheezing, and shortness of breath, those symptoms may get better when you quit. You may also get sick less often. If you are pregnant, quitting smoking can help to lower your chances of having a baby  of low birth weight. What can I do to help me quit smoking? Talk with your doctor about what can help you quit smoking. Some things you can do (strategies) include: Quitting smoking totally, instead of slowly cutting back how much you smoke over a period of time. Going to in-person counseling. You are more likely to quit if you go to many counseling sessions. Using resources and support  systems, such as: Online chats with a Veterinary surgeon. Phone quitlines. Printed Materials engineer. Support groups or group counseling. Text messaging programs. Mobile phone apps or applications. Taking medicines. Some of these medicines may have nicotine in them. If you are pregnant or breastfeeding, do not take any medicines to quit smoking unless your doctor says it is okay. Talk with your doctor about counseling or other things that can help you. Talk with your doctor about using more than one strategy at the same time, such as taking medicines while you are also going to in-person counseling. This can help make quitting easier. What things can I do to make it easier to quit? Quitting smoking might feel very hard at first, but there is a lot that you can do to make it easier. Take these steps: Talk to your family and friends. Ask them to support and encourage you. Call phone quitlines, reach out to support groups, or work with a Veterinary surgeon. Ask people who smoke to not smoke around you. Avoid places that make you want (trigger) to smoke, such as: Bars. Parties. Smoke-break areas at work. Spend time with people who do not smoke. Lower the stress in your life. Stress can make you want to smoke. Try these things to help your stress: Getting regular exercise. Deep-breathing exercises. Yoga. Meditating. Doing a body scan. To do this, close your eyes, focus on one area of your body at a time from head to toe, and notice which parts of your body are tense. Try to relax the muscles in those areas. Download or buy  apps on your mobile phone or tablet that can help you stick to your quit plan. There are many free apps, such as QuitGuide from the [employer] developer for Disease Control and Prevention). You can find more support from smokefree.gov and other websites. This information is not intended to replace advice given to you by your health care provider. Make sure you discuss any questions you have with your health care provider. Document Released: 11/28/2008 Document Revised: 09/30/2015 Document Reviewed: 06/18/2014 Elsevier Interactive Patient Education  2019 [employer]. 2. the ceiling and your feet are flat on the floor. 3. Tighten your buttocks muscles and lift your buttocks off of the floor until your waist is at almost the same height as your knees. You should feel the muscles working in your buttocks and the back of your thighs. If you do not feel these muscles, slide your feet 1-2 inches farther away from your buttocks. 4. Hold this position for 3-5 seconds. 5. Slowly lower your hips to the starting position, and allow your buttocks muscles to relax completely. If this exercise is too easy, try doing it with your arms crossed over your chest. Abdominal Crunches Repeat these steps 5-10 times: 1. Lie on your back on a firm bed or the floor with your legs extended. 2. Bend your knees so they are pointing toward the ceiling and your feet are flat on the floor. 3. Cross your arms over your chest. 4. Tip your chin slightly toward your chest without bending your neck. 5. Tighten your abdominal muscles and slowly raise your trunk (torso) high enough to lift your shoulder blades a tiny bit off of the floor. Avoid raising your torso higher than that, because it can put too much stress on your low back and it does not help to strengthen your abdominal muscles. 6. Slowly return to your starting position. Back Lifts Repeat these steps 5-10 times: 1. Lie on your abdomen (  face-down) with your arms at your sides, and  rest your forehead on the floor. 2. Tighten the muscles in your legs and your buttocks. 3. Slowly lift your chest off of the floor while you keep your hips pressed to the floor. Keep the back of your head in line with the curve in your back. Your eyes should be looking at the floor. 4. Hold this position for 3-5 seconds. 5. Slowly return to your starting position. Contact a health care provider if:  Your back pain or discomfort gets much worse when you do an exercise.  Your back pain or discomfort does not lessen within 2 hours after you exercise. If you have any of these problems, stop doing these exercises right away. Do not do them again unless your health care provider says that you can. Get help right away if:  You develop sudden, severe back pain. If this happens, stop doing the exercises right away. Do not do them again unless your health care provider says that you can. This information is not intended to replace advice given to you by your health care provider. Make sure you discuss any questions you have with your health care provider. Document Released: 03/11/2004 Document Revised: 06/07/2017 Document Reviewed: 03/28/2014 Elsevier Interactive Patient Education  [employer]2019 Elsevier Inc.

## 2018-08-07 NOTE — Telephone Encounter (Signed)
Patient states he has been having abd cramp and cramps in his fingers x 4 days.  Per christy patient is to stay hydrated and we will call when labs come back - patient aware.

## 2018-08-08 ENCOUNTER — Other Ambulatory Visit: Payer: Self-pay | Admitting: Family

## 2018-08-08 DIAGNOSIS — Z794 Long term (current) use of insulin: Secondary | ICD-10-CM | POA: Diagnosis not present

## 2018-08-08 DIAGNOSIS — E119 Type 2 diabetes mellitus without complications: Secondary | ICD-10-CM | POA: Diagnosis not present

## 2018-08-08 LAB — CMP14+EGFR
ALT: 34 IU/L (ref 0–44)
AST: 32 IU/L (ref 0–40)
Albumin/Globulin Ratio: 2.4 — ABNORMAL HIGH (ref 1.2–2.2)
Albumin: 4.5 g/dL (ref 3.8–4.9)
Alkaline Phosphatase: 60 IU/L (ref 39–117)
BUN/Creatinine Ratio: 18 (ref 9–20)
BUN: 20 mg/dL (ref 6–24)
Bilirubin Total: 0.4 mg/dL (ref 0.0–1.2)
CO2: 29 mmol/L (ref 20–29)
Calcium: 10.3 mg/dL — ABNORMAL HIGH (ref 8.7–10.2)
Chloride: 93 mmol/L — ABNORMAL LOW (ref 96–106)
Creatinine, Ser: 1.13 mg/dL (ref 0.76–1.27)
GFR calc Af Amer: 86 mL/min/{1.73_m2} (ref >59)
GFR calc non Af Amer: 74 mL/min/{1.73_m2} (ref >59)
Globulin, Total: 1.9 g/dL (ref 1.5–4.5)
Glucose: 256 mg/dL — ABNORMAL HIGH (ref 65–99)
Potassium: 4.4 mmol/L (ref 3.5–5.2)
Sodium: 136 mmol/L (ref 134–144)
Total Protein: 6.4 g/dL (ref 6.0–8.5)

## 2018-08-08 LAB — BAYER DCA HB A1C WAIVED: HB A1C (BAYER DCA - WAIVED): 12.9 % — ABNORMAL HIGH (ref <7.0)

## 2018-08-08 LAB — SPECIMEN STATUS REPORT

## 2018-08-08 MED ORDER — FARXIGA 10 MG PO TABS: 10.0000 mg | ORAL_TABLET | Freq: Every day | ORAL | 3 refills | Status: DC

## 2018-08-08 NOTE — Addendum Note (Signed)
Addended by: Liliane Bade on: 08/08/2018 09:10 AM   Modules accepted: Orders

## 2018-08-11 NOTE — Telephone Encounter (Signed)
Faxed paperwork 

## 2018-08-14 DIAGNOSIS — H0019 Chalazion unspecified eye, unspecified eyelid: Secondary | ICD-10-CM | POA: Diagnosis not present

## 2018-08-29 ENCOUNTER — Other Ambulatory Visit: Payer: Self-pay | Admitting: Family

## 2018-08-29 DIAGNOSIS — G4700 Insomnia, unspecified: Secondary | ICD-10-CM

## 2018-09-01 DIAGNOSIS — Z79899 Other long term (current) drug therapy: Secondary | ICD-10-CM | POA: Diagnosis not present

## 2018-09-12 ENCOUNTER — Other Ambulatory Visit: Payer: Self-pay | Admitting: Family

## 2018-09-23 ENCOUNTER — Other Ambulatory Visit: Payer: Self-pay | Admitting: Family

## 2018-09-23 DIAGNOSIS — G4700 Insomnia, unspecified: Secondary | ICD-10-CM

## 2018-09-23 DIAGNOSIS — K219 Gastro-esophageal reflux disease without esophagitis: Secondary | ICD-10-CM

## 2018-09-25 ENCOUNTER — Other Ambulatory Visit: Payer: Self-pay | Admitting: Family

## 2018-09-29 DIAGNOSIS — G8929 Other chronic pain: Secondary | ICD-10-CM | POA: Diagnosis not present

## 2018-09-29 DIAGNOSIS — G894 Chronic pain syndrome: Secondary | ICD-10-CM | POA: Diagnosis not present

## 2018-09-29 DIAGNOSIS — Z79899 Other long term (current) drug therapy: Secondary | ICD-10-CM | POA: Diagnosis not present

## 2018-09-29 DIAGNOSIS — R270 Ataxia, unspecified: Secondary | ICD-10-CM | POA: Diagnosis not present

## 2018-09-29 DIAGNOSIS — M545 Low back pain: Secondary | ICD-10-CM | POA: Diagnosis not present

## 2018-10-10 ENCOUNTER — Ambulatory Visit: Payer: Medicare Other | Admitting: Family

## 2018-10-11 ENCOUNTER — Other Ambulatory Visit: Payer: Self-pay | Admitting: Family

## 2018-10-11 DIAGNOSIS — G4700 Insomnia, unspecified: Secondary | ICD-10-CM

## 2018-10-24 ENCOUNTER — Ambulatory Visit: Payer: Medicare Other | Admitting: Family

## 2018-10-24 NOTE — Progress Notes (Signed)
   Virtual Visit via telephone Note Due to COVID-19 pandemic this visit was conducted virtually. This visit type was conducted due to national recommendations for restrictions regarding the COVID-19 Pandemic (e.g. social distancing, sheltering in place) in an effort to limit this patient's exposure and mitigate transmission in our community. All issues noted in this document were discussed and addressed.  A physical exam was not performed with this format.  Attempted to call patient 10:15 Am, No answer, unable to leave VM as it "has not be set up yet".   Attempted to call patient at 10:47 AM, no answer.   Attempted to call patient at 10:50 AM, No answer, He will need to reschedule appt.    Evelina Dun, FNP

## 2018-10-27 ENCOUNTER — Other Ambulatory Visit: Payer: Self-pay | Admitting: Family

## 2018-10-27 DIAGNOSIS — M5136 Other intervertebral disc degeneration, lumbar region: Secondary | ICD-10-CM | POA: Diagnosis not present

## 2018-10-27 DIAGNOSIS — Z79899 Other long term (current) drug therapy: Secondary | ICD-10-CM | POA: Diagnosis not present

## 2018-10-27 DIAGNOSIS — G894 Chronic pain syndrome: Secondary | ICD-10-CM | POA: Diagnosis not present

## 2018-10-27 DIAGNOSIS — Z760 Encounter for issue of repeat prescription: Secondary | ICD-10-CM | POA: Diagnosis not present

## 2018-10-28 ENCOUNTER — Other Ambulatory Visit: Payer: Self-pay | Admitting: Family

## 2018-10-28 DIAGNOSIS — F331 Major depressive disorder, recurrent, moderate: Secondary | ICD-10-CM

## 2018-10-28 DIAGNOSIS — F411 Generalized anxiety disorder: Secondary | ICD-10-CM

## 2018-10-31 ENCOUNTER — Encounter: Payer: Self-pay | Admitting: Family

## 2018-10-31 ENCOUNTER — Ambulatory Visit (INDEPENDENT_AMBULATORY_CARE_PROVIDER_SITE_OTHER): Payer: Medicare Other | Admitting: Family

## 2018-10-31 ENCOUNTER — Other Ambulatory Visit: Payer: Self-pay

## 2018-10-31 DIAGNOSIS — Z794 Long term (current) use of insulin: Secondary | ICD-10-CM | POA: Diagnosis not present

## 2018-10-31 DIAGNOSIS — I10 Essential (primary) hypertension: Secondary | ICD-10-CM

## 2018-10-31 DIAGNOSIS — G4700 Insomnia, unspecified: Secondary | ICD-10-CM | POA: Diagnosis not present

## 2018-10-31 DIAGNOSIS — E785 Hyperlipidemia, unspecified: Secondary | ICD-10-CM

## 2018-10-31 DIAGNOSIS — I152 Hypertension secondary to endocrine disorders: Secondary | ICD-10-CM

## 2018-10-31 DIAGNOSIS — E119 Type 2 diabetes mellitus without complications: Secondary | ICD-10-CM

## 2018-10-31 DIAGNOSIS — E1159 Type 2 diabetes mellitus with other circulatory complications: Secondary | ICD-10-CM | POA: Diagnosis not present

## 2018-10-31 DIAGNOSIS — E1169 Type 2 diabetes mellitus with other specified complication: Secondary | ICD-10-CM

## 2018-10-31 LAB — BAYER DCA HB A1C WAIVED: HB A1C (BAYER DCA - WAIVED): 11.1 % — ABNORMAL HIGH (ref <7.0)

## 2018-10-31 MED ORDER — ZOLPIDEM TARTRATE 10 MG PO TABS: 10.0000 mg | ORAL_TABLET | Freq: Every day | ORAL | 5 refills | Status: DC

## 2018-10-31 NOTE — Addendum Note (Signed)
Addended by: Marin Olp on: 10/31/2018 03:19 PM   Modules accepted: Orders

## 2018-10-31 NOTE — Progress Notes (Signed)
Attempted to call patient 11:43AM! No answer.VM not set up    Virtual Visit via telephone Note Due to COVID-19 pandemic this visit was conducted virtually. This visit type was conducted due to national recommendations for restrictions regarding the COVID-19 Pandemic (e.g. social distancing, sheltering in place) in an effort to limit this patient's exposure and mitigate transmission in our community. All issues noted in this document were discussed and addressed.  A physical exam was not performed with this format.  I connected with Charles Pennington on 10/31/18 at 12:08 pm by telephone and verified that I am speaking with the correct person using two identifiers. Charles Pennington is currently located at home and no one is currently with him during visit. The provider, Evelina Dun, FNP is located in their office at time of visit.  I discussed the limitations, risks, security and privacy concerns of performing an evaluation and management service by telephone and the availability of in person appointments. I also discussed with the patient that there may be a patient responsible charge related to this service. The patient expressed understanding and agreed to proceed.   History and Present Illness:  Insomnia Primary symptoms: difficulty falling asleep, frequent awakening.  The onset quality is gradual. The problem occurs intermittently. Past treatments include medication. The treatment provided moderate relief.      Review of Systems  Psychiatric/Behavioral: The patient has insomnia.      Observations/Objective: No SOB or distress   Assessment and Plan: 1. Insomnia, unspecified type Sleep ritual discussed Avoid caffeine He will make appt for face to [facility] in the next eeek  - zolpidem (AMBIEN) 10 MG tablet; Take 1 tablet (10 mg total) by mouth at bedtime.  Dispense: 30 tablet; Refill: 5     I discussed the assessment and treatment plan with the patient. The patient was provided  an opportunity to ask questions and all were answered. The patient agreed with the plan and demonstrated an understanding of the instructions.   The patient was advised to call back or seek an in-person evaluation if the symptoms worsen or if the condition fails to improve as anticipated.  The above assessment and management plan was discussed with the patient. The patient verbalized understanding of and has agreed to the management plan. Patient is aware to call the clinic if symptoms persist or worsen. Patient is aware when to return to the clinic for a follow-up visit. Patient educated on when it is appropriate to go to the emergency department.   Time call ended:  12:16 pm  I provided 8 minutes of non-face-to-face time during this encounter.    Evelina Dun, FNP

## 2018-11-01 LAB — CMP14+EGFR
ALT: 18 IU/L (ref 0–44)
AST: 22 IU/L (ref 0–40)
Albumin/Globulin Ratio: 1.8 (ref 1.2–2.2)
Albumin: 4.4 g/dL (ref 3.8–4.9)
Alkaline Phosphatase: 58 IU/L (ref 39–117)
BUN/Creatinine Ratio: 13 (ref 9–20)
BUN: 14 mg/dL (ref 6–24)
Bilirubin Total: 0.2 mg/dL (ref 0.0–1.2)
CO2: 25 mmol/L (ref 20–29)
Calcium: 9.4 mg/dL (ref 8.7–10.2)
Chloride: 99 mmol/L (ref 96–106)
Creatinine, Ser: 1.1 mg/dL (ref 0.76–1.27)
GFR calc Af Amer: 89 mL/min/{1.73_m2} (ref >59)
GFR calc non Af Amer: 77 mL/min/{1.73_m2} (ref >59)
Globulin, Total: 2.4 g/dL (ref 1.5–4.5)
Glucose: 227 mg/dL — ABNORMAL HIGH (ref 65–99)
Potassium: 4.2 mmol/L (ref 3.5–5.2)
Sodium: 138 mmol/L (ref 134–144)
Total Protein: 6.8 g/dL (ref 6.0–8.5)

## 2018-11-01 LAB — CBC WITH DIFFERENTIAL/PLATELET
Basophils Absolute: 0.1 10*3/uL (ref 0.0–0.2)
Basos: 1 %
EOS (ABSOLUTE): 0.3 10*3/uL (ref 0.0–0.4)
Eos: 5 %
Hematocrit: 51.5 % — ABNORMAL HIGH (ref 37.5–51.0)
Hemoglobin: 17 g/dL (ref 13.0–17.7)
Immature Grans (Abs): 0 10*3/uL (ref 0.0–0.1)
Immature Granulocytes: 0 %
Lymphocytes Absolute: 3 10*3/uL (ref 0.7–3.1)
Lymphs: 42 %
MCH: 30.7 pg (ref 26.6–33.0)
MCHC: 33 g/dL (ref 31.5–35.7)
MCV: 93 fL (ref 79–97)
Monocytes Absolute: 0.5 10*3/uL (ref 0.1–0.9)
Monocytes: 7 %
Neutrophils Absolute: 3.3 10*3/uL (ref 1.4–7.0)
Neutrophils: 45 %
Platelets: 238 10*3/uL (ref 150–450)
RBC: 5.54 x10E6/uL (ref 4.14–5.80)
RDW: 12.4 % (ref 11.6–15.4)
WBC: 7.2 10*3/uL (ref 3.4–10.8)

## 2018-11-01 LAB — LIPID PANEL
Chol/HDL Ratio: 2.7 ratio (ref 0.0–5.0)
Cholesterol, Total: 114 mg/dL (ref 100–199)
HDL: 43 mg/dL (ref >39)
LDL Chol Calc (NIH): 54 mg/dL (ref 0–99)
Triglycerides: 90 mg/dL (ref 0–149)
VLDL Cholesterol Cal: 17 mg/dL (ref 5–40)

## 2018-11-02 ENCOUNTER — Other Ambulatory Visit: Payer: Self-pay | Admitting: Family

## 2018-11-27 DIAGNOSIS — Z7189 Other specified counseling: Secondary | ICD-10-CM | POA: Diagnosis not present

## 2018-11-27 DIAGNOSIS — M5136 Other intervertebral disc degeneration, lumbar region: Secondary | ICD-10-CM | POA: Diagnosis not present

## 2018-11-27 DIAGNOSIS — G894 Chronic pain syndrome: Secondary | ICD-10-CM | POA: Diagnosis not present

## 2018-11-27 DIAGNOSIS — Z79899 Other long term (current) drug therapy: Secondary | ICD-10-CM | POA: Diagnosis not present

## 2018-12-07 ENCOUNTER — Other Ambulatory Visit: Payer: Self-pay | Admitting: Family

## 2018-12-07 DIAGNOSIS — F331 Major depressive disorder, recurrent, moderate: Secondary | ICD-10-CM

## 2018-12-07 DIAGNOSIS — F411 Generalized anxiety disorder: Secondary | ICD-10-CM

## 2018-12-20 ENCOUNTER — Other Ambulatory Visit: Payer: Self-pay | Admitting: Family

## 2018-12-26 DIAGNOSIS — M5136 Other intervertebral disc degeneration, lumbar region: Secondary | ICD-10-CM | POA: Diagnosis not present

## 2018-12-27 ENCOUNTER — Telehealth: Payer: Self-pay | Admitting: Family

## 2018-12-27 NOTE — Chronic Care Management (AMB) (Signed)
Chronic Care Management   Note  12/27/2018 Name: DAISY MCNEEL MRN: [id number] DOB: Feb 09, 1966  DANILO CAPPIELLO is a 53 y.o. year old male who is a primary care patient of Sharion Balloon, FNP. I reached out to [name] by phone today in response to a referral sent by Mr. Sheron Robin Mcmahan's health plan.     Mr. Lurz was given information about Chronic Care Management services today including:  1. CCM service includes personalized support from designated clinical staff supervised by his physician, including individualized plan of care and coordination with other care providers 2. 24/7 contact phone numbers for assistance for urgent and routine care needs. 3. Service will only be billed when office clinical staff spend 20 minutes or more in a month to coordinate care. 4. Only one practitioner may furnish and bill the service in a calendar month. 5. The patient may stop CCM services at any time (effective at the end of the month) by phone call to the office staff. 6. The patient will be responsible for cost sharing (co-pay) of up to 20% of the service fee (after annual deductible is met).  Patient agreed to services and verbal consent obtained.   Follow up plan: Telephone appointment with CCM team member scheduled for: 01/10/2019  [facility]  [address], [place] [address] Direct Dial: [email or web address]  Website: [email or web address]

## 2018-12-29 ENCOUNTER — Other Ambulatory Visit: Payer: Self-pay | Admitting: Family

## 2018-12-29 DIAGNOSIS — Z79899 Other long term (current) drug therapy: Secondary | ICD-10-CM | POA: Diagnosis not present

## 2018-12-29 DIAGNOSIS — M5136 Other intervertebral disc degeneration, lumbar region: Secondary | ICD-10-CM | POA: Diagnosis not present

## 2018-12-29 DIAGNOSIS — G894 Chronic pain syndrome: Secondary | ICD-10-CM | POA: Diagnosis not present

## 2018-12-29 DIAGNOSIS — K219 Gastro-esophageal reflux disease without esophagitis: Secondary | ICD-10-CM

## 2019-01-01 ENCOUNTER — Other Ambulatory Visit: Payer: Self-pay | Admitting: Family

## 2019-01-10 ENCOUNTER — Ambulatory Visit (INDEPENDENT_AMBULATORY_CARE_PROVIDER_SITE_OTHER): Payer: Medicare Other | Admitting: *Deleted

## 2019-01-10 DIAGNOSIS — E039 Hypothyroidism, unspecified: Secondary | ICD-10-CM | POA: Diagnosis not present

## 2019-01-10 DIAGNOSIS — F331 Major depressive disorder, recurrent, moderate: Secondary | ICD-10-CM

## 2019-01-10 DIAGNOSIS — E1169 Type 2 diabetes mellitus with other specified complication: Secondary | ICD-10-CM | POA: Diagnosis not present

## 2019-01-10 DIAGNOSIS — J4520 Mild intermittent asthma, uncomplicated: Secondary | ICD-10-CM

## 2019-01-10 DIAGNOSIS — Z794 Long term (current) use of insulin: Secondary | ICD-10-CM

## 2019-01-10 DIAGNOSIS — E785 Hyperlipidemia, unspecified: Secondary | ICD-10-CM

## 2019-01-10 DIAGNOSIS — E119 Type 2 diabetes mellitus without complications: Secondary | ICD-10-CM

## 2019-01-10 NOTE — Patient Instructions (Signed)
Visit Information  Goals Addressed            This Visit's Progress   . RN Initial Goal       Current Barriers:  . Chronic Disease Management support, education, and care coordination needs related to HTN, hx of CVA, Asthma, GERD, DM, memory difficulties, hyperlipidemia, depression  Clinical Goal(s) related to HTN, hx of CVA, Asthma, GERD, DM, memory difficulties, hyperlipidemia, depression:  Over the next 30 days, patient will:  . Work with the care management team to address educational, disease management, and care coordination needs  . Begin or continue self health monitoring activities as directed today Measure and record blood pressure weekly and PRN and measure and record CBG twice daily and PRN . Call provider office for new or worsened signs and symptoms Blood glucose findings outside established parameters and blood sugar readings outside of established parameters . Call care management team with questions or concerns . Verbalize basic understanding of patient centered plan of care established today  Interventions related to HTN, hx of CVA, Asthma, GERD, DM, memory difficulties, hyperlipidemia, depression:  . Evaluation of current treatment plans and patient's adherence to plan as established by provider . Assessed patient understanding of disease states . Assessed patient's education and care coordination needs . Provided disease specific education to patient  . Collaborated with appropriate clinical care team members regarding patient needs  Patient Self Care Activities related to HTN, hx of CVA, Asthma, GERD, DM, memory difficulties, hyperlipidemia, depression:  . Patient is unable to independently self-manage chronic health conditions  Initial goal documentation        The patient verbalized understanding of instructions provided today and declined a print copy of patient instruction materials.   The care management team will reach out to the patient again over the  next 30 days.   Chong Sicilian, BSN, RN-BC Embedded Chronic Care Manager [facility] Direct Dial: [phone]    Mr. Perkin was given information about Chronic Care Management services today including:  1. CCM service includes personalized support from designated clinical staff supervised by his physician, including individualized plan of care and coordination with other care providers 2. 24/7 contact phone numbers for assistance for urgent and routine care needs. 3. Service will only be billed when office clinical staff spend 20 minutes or more in a month to coordinate care. 4. Only one practitioner may furnish and bill the service in a calendar month. 5. The patient may stop CCM services at any time (effective at the end of the month) by phone call to the office staff. 6. The patient will be responsible for cost sharing (co-pay) of up to 20% of the service fee (after annual deductible is met).  Patient agreed to services and verbal consent obtained.

## 2019-01-10 NOTE — Chronic Care Management (AMB) (Addendum)
Chronic Care Management   Initial Visit Note  01/10/2019 Name: Charles Pennington MRN: [id number] DOB: [birth year]  Referred by: Sharion Balloon, FNP Reason for referral : Chronic Care Management (RN Initial Outreach)   Charles Pennington is a 53 y.o. year old male who is a primary care patient of Sharion Balloon, FNP. The CCM team was consulted for assistance with chronic disease management and care coordination needs related to HTN, hx of CVA, Asthma, GERD, DM, memory difficulties, hyperlipidemia, and depression.  Review of patient status, including review of consultants reports, relevant laboratory and other test results, and collaboration with appropriate care team members and the patient's provider was performed as part of comprehensive patient evaluation and provision of chronic care management services.    SDOH (Social Determinants of Health) screening performed today: Tobacco Use Physical Activity. See Care Plan for related entries.   SUBJECTIVE I spoke with Charles Pennington by telephone today regarding his chronic medical conditions and chronic care needs.   OBJECTIVE: Outpatient Encounter Medications as of 01/10/2019  Medication Sig   albuterol (PROVENTIL HFA;VENTOLIN HFA) 108 (90 Base) MCG/ACT inhaler 2 PUFFS INTO THE LUNGS EVERY 6 (SIX) HOURS AS NEEDED FOR WHEEZING.   albuterol (PROVENTIL) (2.5 MG/3ML) 0.083% nebulizer solution Take 3 mLs (2.5 mg total) by nebulization every 6 (six) hours as needed for wheezing or shortness of breath.   amLODipine (NORVASC) 5 MG tablet TAKE 1 TABLET (5 MG TOTAL) BY MOUTH DAILY.   aspirin EC 81 MG tablet Take 1 tablet (81 mg total) by mouth daily.   atorvastatin (LIPITOR) 80 MG tablet Take 1 tablet (80 mg total) by mouth daily. (Needs to be seen before next refill)   baclofen (LIORESAL) 10 MG tablet    BELBUCA 450 MCG FILM    blood glucose meter kit and supplies Dispense based on patient and insurance preference. Test sugar BID and as  needed   BREO ELLIPTA 100-25 MCG/INH AEPB Inhale 1 puff into the lungs daily.   CONTOUR NEXT TEST test strip Check blood sugar twice daily Dx E11.9   dapagliflozin propanediol (FARXIGA) 10 MG TABS tablet Take 10 mg by mouth daily.   Dulaglutide (TRULICITY) 1.5 WU/9.8JX SOPN INJECT 1.5 MG SUB-Q ONCE A WEEK   fenofibrate 160 MG tablet TAKE 1 TABLET (160 MG TOTAL) BY MOUTH DAILY.   Fluticasone-Umeclidin-Vilant (TRELEGY ELLIPTA) 100-62.5-25 MCG/INH AEPB Inhale 1 puff into the lungs daily.   HYDROcodone-acetaminophen (NORCO/VICODIN) 5-325 MG tablet    ibuprofen (ADVIL,MOTRIN) 800 MG tablet    levothyroxine (SYNTHROID, LEVOTHROID) 175 MCG tablet TAKE (1) TABLET DAILY BE- FORE BREAKFAST.   lisinopril (PRINIVIL,ZESTRIL) 20 MG tablet Take 20 mg by mouth daily.   metFORMIN (GLUCOPHAGE-XR) 750 MG 24 hr tablet TAKE 2 TABLET WITH MORNING MEAL   omeprazole (PRILOSEC) 20 MG capsule TAKE (1) CAPSULE DAILY   PARoxetine (PAXIL) 40 MG tablet TAKE 1 TABLET DAILY   pregabalin (LYRICA) 150 MG capsule TAKE 2 CAPSULES EVERY MORNING AND 2 CAPSULES EVERY EVENING   TRESIBA FLEXTOUCH 200 UNIT/ML SOPN Inject 80-90 Units into the skin daily. (Needs to be seen)   ULTICARE MICRO PEN NEEDLES 32G X 4 MM MISC Use 2 times a day To test blood glucose   zolpidem (AMBIEN) 10 MG tablet Take 1 tablet (10 mg total) by mouth at bedtime.   No facility-administered encounter medications on file as of 01/10/2019.     Lab Results  Component Value Date   HGBA1C 11.1 (H) 10/31/2018   HGBA1C 12.9 (H) 08/08/2018  HGBA1C 13.8 (H) 06/30/2018   Lab Results  Component Value Date   MICROALBUR 20 03/15/2014   LDLCALC 54 10/31/2018   CREATININE 1.10 10/31/2018   BP Readings from Last 3 Encounters:  06/30/18 131/90  03/06/18 129/90  03/02/18 106/71    RN ASSESSMENT & CARE PLAN    Current Barriers:   Chronic Disease Management support, education, and care coordination needs related to HTN, hx of CVA, Asthma, GERD,  DM, memory difficulties, hyperlipidemia, depression  Clinical Goal(s) related to HTN, hx of CVA, Asthma, GERD, DM, memory difficulties, hyperlipidemia, depression:  Over the next 30 days, patient will:   Work with the care management team to address educational, disease management, and care coordination needs   Begin or continue self health monitoring activities as directed today Measure and record blood pressure weekly and PRN and measure and record CBG twice daily and PRN  Call provider office for new or worsened signs and symptoms Blood glucose findings outside established parameters and blood sugar readings outside of established parameters  Call care management team with questions or concerns  Verbalize basic understanding of patient centered plan of care established today  Interventions related to HTN, hx of CVA, Asthma, GERD, DM, memory difficulties, hyperlipidemia, depression:   Evaluation of current treatment plans and patient's adherence to plan as established by provider  Assessed patient understanding of disease states  Assessed patient's education and care coordination needs  Provided disease specific education to patient   Collaborated with appropriate clinical care team members regarding patient needs  Patient Self Care Activities related to HTN, hx of CVA, Asthma, GERD, DM, memory difficulties, hyperlipidemia, depression:   Patient is unable to independently self-manage chronic health conditions  Initial goal documentation         Follow-up Plan: The care management team will reach out to the patient again over the next 30 days.    Chong Sicilian, BSN, RN-BC Embedded Chronic Care Manager [facility] [place] [facility] Direct Dial: [phone]     I have reviewed and agree with the above  documentation.    Evelina Dun, FNP

## 2019-01-15 ENCOUNTER — Other Ambulatory Visit: Payer: Self-pay | Admitting: Family

## 2019-01-15 ENCOUNTER — Telehealth: Payer: Self-pay | Admitting: Family

## 2019-01-18 NOTE — Telephone Encounter (Signed)
appt made for 01/19/19 DM foot exam needed

## 2019-01-19 ENCOUNTER — Ambulatory Visit: Payer: Medicare Other | Admitting: Family

## 2019-01-23 ENCOUNTER — Encounter: Payer: Self-pay | Admitting: Family

## 2019-01-26 DIAGNOSIS — G894 Chronic pain syndrome: Secondary | ICD-10-CM | POA: Diagnosis not present

## 2019-01-26 DIAGNOSIS — M25512 Pain in left shoulder: Secondary | ICD-10-CM | POA: Diagnosis not present

## 2019-01-26 DIAGNOSIS — Z79899 Other long term (current) drug therapy: Secondary | ICD-10-CM | POA: Diagnosis not present

## 2019-01-26 DIAGNOSIS — M5136 Other intervertebral disc degeneration, lumbar region: Secondary | ICD-10-CM | POA: Diagnosis not present

## 2019-01-29 ENCOUNTER — Other Ambulatory Visit: Payer: Self-pay | Admitting: Family

## 2019-02-02 ENCOUNTER — Telehealth: Payer: Medicare Other

## 2019-02-06 ENCOUNTER — Ambulatory Visit (INDEPENDENT_AMBULATORY_CARE_PROVIDER_SITE_OTHER): Payer: Medicare Other | Admitting: *Deleted

## 2019-02-06 DIAGNOSIS — Z794 Long term (current) use of insulin: Secondary | ICD-10-CM | POA: Diagnosis not present

## 2019-02-06 DIAGNOSIS — E1159 Type 2 diabetes mellitus with other circulatory complications: Secondary | ICD-10-CM | POA: Diagnosis not present

## 2019-02-06 DIAGNOSIS — I10 Essential (primary) hypertension: Secondary | ICD-10-CM

## 2019-02-06 DIAGNOSIS — E119 Type 2 diabetes mellitus without complications: Secondary | ICD-10-CM | POA: Diagnosis not present

## 2019-02-06 NOTE — Patient Instructions (Addendum)
Visit Information  Goals Addressed            This Visit's Progress     Patient Stated   . Increase Physical Activity (pt-stated)       Decreased physical activity in a patient with diabetes and hypertension.   Current Barriers:  Marland Kitchen Knowledge Deficits related to ways to safely increase physical activity . Cognitive Deficits . Shoulder pain  Nurse Case Manager Clinical Goal(s):  Marland Kitchen Over the next 60 days, patient will work with Consulting civil engineer to address needs related to physical activity.   Interventions:  . Advised patient to stay within comfort zone with physical activity and to stop any activity that causes pain . Provided education to patient re: home exercise . Resources printed and to be mailed . Discussed plans with patient for ongoing care management follow up and provided patient with direct contact information for care management team  Patient Self Care Activities:  . Performs ADL's independently . Performs IADL's independently  Initial goal documentation       Other   . Diabetes Management       Current Barriers:  . Chronic Disease Management support and education needs related to diabetes  Nurse Case Manager Clinical Goal(s):  Marland Kitchen Over the next 60 days, patient will meet with RN Care Manager to address diabetes self-management  Interventions:  . Evaluation of current treatment plan related to diabetes and patient's adherence to plan as established by provider. . Reviewed medications with patient and discussed Trulicity, Tresiba, Metformin, Farxiga . Discussed plans with patient for ongoing care management follow up and provided patient with direct contact information for care management team . Advised patient, providing education and rationale, to check cbg twice daily and record, calling [phone] for findings outside established parameters.   . Discussed diet/nutrition  Patient Self Care Activities:  . Performs ADL's independently . Performs IADL's  independently  Initial goal documentation        The patient verbalized understanding of instructions provided today and declined a print copy of patient instruction materials.   The care management team will reach out to the patient again over the next 60 days.   Chong Sicilian, BSN, RN-BC Embedded Chronic Care Manager [facility] Direct Dial: [phone]      Exercising to Stay Healthy To become healthy and stay healthy, it is recommended that you do moderate-intensity and vigorous-intensity exercise. You can tell that you are exercising at a moderate intensity if your heart starts beating faster and you start breathing faster but can still hold a conversation. You can tell that you are exercising at a vigorous intensity if you are breathing much harder and faster and cannot hold a conversation while exercising. Exercising regularly is important. It has many health benefits, such as:  Improving overall fitness, flexibility, and endurance.  Increasing bone density.  Helping with weight control.  Decreasing body fat.  Increasing muscle strength.  Reducing stress and tension.  Improving overall health. How often should I exercise? Choose an activity that you enjoy, and set realistic goals. Your health care provider can help you make an activity plan that works for you. Exercise regularly as told by your health care provider. This may include:  Doing strength training two times a week, such as: ? Lifting weights. ? Using resistance bands. ? Push-ups. ? Sit-ups. ? Yoga.  Doing a certain intensity of exercise for a given amount of time. Choose from these options: ? A  total of 150 minutes of moderate-intensity exercise every week. ? A total of 75 minutes of vigorous-intensity exercise every week. ? A mix of moderate-intensity and vigorous-intensity exercise every week. Children, pregnant women, people who have not exercised  regularly, people who are overweight, and older adults may need to talk with a health care provider about what activities are safe to do. If you have a medical condition, be sure to talk with your health care provider before you start a new exercise program. What are some exercise ideas? Moderate-intensity exercise ideas include:  Walking 1 mile (1.6 km) in about 15 minutes.  Biking.  Hiking.  Golfing.  Dancing.  Water aerobics. Vigorous-intensity exercise ideas include:  Walking 4.5 miles (7.2 km) or more in about 1 hour.  Jogging or running 5 miles (8 km) in about 1 hour.  Biking 10 miles (16.1 km) or more in about 1 hour.  Lap swimming.  Roller-skating or in-line skating.  Cross-country skiing.  Vigorous competitive sports, such as football, basketball, and soccer.  Jumping rope.  Aerobic dancing. What are some everyday activities that can help me to get exercise?  Yard work, such as: ? Pushing a Surveyor, mining. ? Raking and bagging leaves.  Washing your car.  Pushing a stroller.  Shoveling snow.  Gardening.  Washing windows or floors. How can I be more active in my day-to-day activities?  Use stairs instead of an elevator.  Take a walk during your lunch break.  If you drive, park your car farther away from your work or school.  If you take public transportation, get off one stop early and walk the rest of the way.  Stand up or walk around during all of your indoor phone calls.  Get up, stretch, and walk around every 30 minutes throughout the day.  Enjoy exercise with a friend. Support to continue exercising will help you keep a regular routine of activity. What guidelines can I follow while exercising?  Before you start a new exercise program, talk with your health care provider.  Do not exercise so much that you hurt yourself, feel dizzy, or get very short of breath.  Wear comfortable clothes and wear shoes with good support.  Drink plenty of  water while you exercise to prevent dehydration or heat stroke.  Work out until your breathing and your heartbeat get faster. Where to find more information  [place] Department of Health and Human Services: ThisPath.fi  Centers for Disease Control and Prevention (CDC): FootballExhibition.com.br Summary  Exercising regularly is important. It will improve your overall fitness, flexibility, and endurance.  Regular exercise also will improve your overall health. It can help you control your weight, reduce stress, and improve your bone density.  Do not exercise so much that you hurt yourself, feel dizzy, or get very short of breath.  Before you start a new exercise program, talk with your health care provider. This information is not intended to replace advice given to you by your health care provider. Make sure you discuss any questions you have with your health care provider. Document Released: 03/06/2010 Document Revised: 01/14/2017 Document Reviewed: 12/23/2016 Elsevier Patient Education  2020 [employer].

## 2019-02-06 NOTE — Chronic Care Management (AMB) (Addendum)
Chronic Care Management   Follow Up Note   02/06/2019 Name: Charles Pennington MRN: [id number] DOB: Aug 01, 1965  Referred by: Sharion Balloon, FNP Reason for referral : Chronic Care Management (RN follow up)   Charles Pennington is a 53 y.o. year old male who is a primary care patient of Sharion Balloon, FNP. The CCM team was consulted for assistance with chronic disease management and care coordination needs.    Review of patient status, including review of consultants reports, relevant laboratory and other test results, and collaboration with appropriate care team members and the patient's provider was performed as part of comprehensive patient evaluation and provision of chronic care management services.    SDOH (Social Determinants of Health) screening performed today: Tobacco Use Physical Activity. See Care Plan for related entries.   Outpatient Encounter Medications as of 02/06/2019  Medication Sig   albuterol (PROVENTIL HFA;VENTOLIN HFA) 108 (90 Base) MCG/ACT inhaler 2 PUFFS INTO THE LUNGS EVERY 6 (SIX) HOURS AS NEEDED FOR WHEEZING.   albuterol (PROVENTIL) (2.5 MG/3ML) 0.083% nebulizer solution Take 3 mLs (2.5 mg total) by nebulization every 6 (six) hours as needed for wheezing or shortness of breath.   amLODipine (NORVASC) 5 MG tablet TAKE 1 TABLET (5 MG TOTAL) BY MOUTH DAILY.   aspirin EC 81 MG tablet Take 1 tablet (81 mg total) by mouth daily.   atorvastatin (LIPITOR) 80 MG tablet TAKE 1 TABLET ONCE A DAY   baclofen (LIORESAL) 10 MG tablet    BELBUCA 450 MCG FILM    blood glucose meter kit and supplies Dispense based on patient and insurance preference. Test sugar BID and as needed   BREO ELLIPTA 100-25 MCG/INH AEPB Inhale 1 puff into the lungs daily.   CONTOUR NEXT TEST test strip Check blood sugar twice daily Dx E11.9   dapagliflozin propanediol (FARXIGA) 10 MG TABS tablet Take 10 mg by mouth daily.   Dulaglutide (TRULICITY) 1.5 XJ/8.8TG SOPN INJECT 1.5 MG SUB-Q ONCE A  WEEK   fenofibrate 160 MG tablet TAKE 1 TABLET (160 MG TOTAL) BY MOUTH DAILY.   Fluticasone-Umeclidin-Vilant (TRELEGY ELLIPTA) 100-62.5-25 MCG/INH AEPB Inhale 1 puff into the lungs daily.   HYDROcodone-acetaminophen (NORCO/VICODIN) 5-325 MG tablet    ibuprofen (ADVIL,MOTRIN) 800 MG tablet    levothyroxine (SYNTHROID, LEVOTHROID) 175 MCG tablet TAKE (1) TABLET DAILY BE- FORE BREAKFAST.   lisinopril (PRINIVIL,ZESTRIL) 20 MG tablet Take 20 mg by mouth daily.   metFORMIN (GLUCOPHAGE-XR) 750 MG 24 hr tablet TAKE 2 TABLET WITH MORNING MEAL   omeprazole (PRILOSEC) 20 MG capsule TAKE (1) CAPSULE DAILY   PARoxetine (PAXIL) 40 MG tablet TAKE 1 TABLET DAILY   pregabalin (LYRICA) 150 MG capsule TAKE 2 CAPSULES EVERY MORNING AND 2 CAPSULES EVERY EVENING   TRESIBA FLEXTOUCH 200 UNIT/ML SOPN Inject 80-90 Units into the skin daily. (Needs to be seen)   ULTICARE MICRO PEN NEEDLES 32G X 4 MM MISC Use 2 times a day To test blood glucose   zolpidem (AMBIEN) 10 MG tablet Take 1 tablet (10 mg total) by mouth at bedtime.   No facility-administered encounter medications on file as of 02/06/2019.    RN Care Plan     Increase Physical Activity (pt-stated)       Decreased physical activity in a patient with diabetes and hypertension.   Current Barriers:  Knowledge Deficits related to ways to safely increase physical activity Cognitive Deficits Shoulder pain  Nurse Case Manager Clinical Goal(s):  Over the next 60 days, patient will work  with RN Care Manager to address needs related to physical activity.   Interventions:  Advised patient to stay within comfort zone with physical activity and to stop any activity that causes pain Provided education to patient re: home exercise Resources printed and to be mailed Discussed plans with patient for ongoing care management follow up and provided patient with direct contact information for care management team  Patient Self Care Activities:  Performs ADL's  independently Performs IADL's independently  Initial goal documentation        Diabetes Management       Current Barriers:  Chronic Disease Management support and education needs related to diabetes  Nurse Case Manager Clinical Goal(s):  Over the next 60 days, patient will meet with RN Care Manager to address diabetes self-management  Interventions:  Evaluation of current treatment plan related to diabetes and patient's adherence to plan as established by provider. Reviewed medications with patient and discussed Trulicity, Tyler Aas, Metformin, Wilder Glade Discussed plans with patient for ongoing care management follow up and provided patient with direct contact information for care management team Advised patient, providing education and rationale, to check cbg twice daily and record, calling [phone] for findings outside established parameters.   Discussed diet/nutrition  Patient Self Care Activities:  Performs ADL's independently Performs IADL's independently  Initial goal documentation        Follow-up Plan The care management team will reach out to the patient again over the next 60 days.    Chong Sicilian, BSN, RN-BC Embedded Chronic Care Manager [facility] [place] [facility] Direct Dial: [phone]   I have reviewed and agree with the above documentation.   Evelina Dun, FNP

## 2019-02-22 ENCOUNTER — Other Ambulatory Visit: Payer: Self-pay | Admitting: Family

## 2019-02-22 DIAGNOSIS — Z79899 Other long term (current) drug therapy: Secondary | ICD-10-CM | POA: Diagnosis not present

## 2019-02-22 DIAGNOSIS — M25512 Pain in left shoulder: Secondary | ICD-10-CM | POA: Diagnosis not present

## 2019-02-22 DIAGNOSIS — M5136 Other intervertebral disc degeneration, lumbar region: Secondary | ICD-10-CM | POA: Diagnosis not present

## 2019-02-22 DIAGNOSIS — G894 Chronic pain syndrome: Secondary | ICD-10-CM | POA: Diagnosis not present

## 2019-03-06 ENCOUNTER — Other Ambulatory Visit: Payer: Self-pay | Admitting: Family

## 2019-03-06 DIAGNOSIS — F411 Generalized anxiety disorder: Secondary | ICD-10-CM

## 2019-03-06 DIAGNOSIS — F331 Major depressive disorder, recurrent, moderate: Secondary | ICD-10-CM

## 2019-03-15 ENCOUNTER — Other Ambulatory Visit: Payer: Self-pay

## 2019-03-15 ENCOUNTER — Other Ambulatory Visit: Payer: Self-pay | Admitting: Family

## 2019-03-15 ENCOUNTER — Encounter: Payer: Self-pay | Admitting: Family Medicine

## 2019-03-15 ENCOUNTER — Ambulatory Visit (INDEPENDENT_AMBULATORY_CARE_PROVIDER_SITE_OTHER): Payer: Medicare Other | Admitting: Family Medicine

## 2019-03-15 VITALS — BP 140/81 | HR 105 | Temp 98.7°F | Ht 68.0 in | Wt 232.2 lb

## 2019-03-15 DIAGNOSIS — H00015 Hordeolum externum left lower eyelid: Secondary | ICD-10-CM | POA: Diagnosis not present

## 2019-03-15 DIAGNOSIS — L02412 Cutaneous abscess of left axilla: Secondary | ICD-10-CM | POA: Diagnosis not present

## 2019-03-15 MED ORDER — METFORMIN HCL ER 750 MG PO TB24: ORAL_TABLET | ORAL | 0 refills | Status: DC

## 2019-03-15 MED ORDER — SULFAMETHOXAZOLE-TRIMETHOPRIM 800-160 MG PO TABS: 1.0000 | ORAL_TABLET | Freq: Two times a day (BID) | ORAL | 0 refills | Status: DC

## 2019-03-15 NOTE — Progress Notes (Signed)
BP 140/81   Pulse (!) 105   Temp 98.7 F (37.1 C) (Temporal)   Ht 5\' 8"  (1.727 m)   Wt 232 lb 3.2 oz (105.3 kg)   SpO2 97%   BMI 35.31 kg/m    Subjective:   Patient ID: , male    DOB: 1965-12-30, 54 y.o.   MRN: [id number]  HPI: Charles Pennington is a 54 y.o. male presenting on 03/15/2019 for Recurrent Skin Infections (left axilary - x 1 week ) and Facial Swelling (left eye- x 4 days)   HPI Left eye stye and cellulitis of left axilla Patient noticed abscess starting to develop under his left axilla for about a week and a half and then he noticed over the past 4 days he started developing swelling and redness under his left eyelid like a stye.  Patient does admit that he has diabetes and he has not been keeping the best control of it.  Patient denies any fevers or chills.  He said he is try to get the one under this left axilla to drain but it has not drained form.  Relevant past medical, surgical, family and social history reviewed and updated as indicated. Interim medical history since our last visit reviewed. Allergies and medications reviewed and updated.  Review of Systems  Constitutional: Negative for chills and fever.  Respiratory: Negative for shortness of breath and wheezing.   Cardiovascular: Negative for chest pain and leg swelling.  Musculoskeletal: Negative for back pain and gait problem.  Skin: Positive for color change, rash and wound.  All other systems reviewed and are negative.   Per HPI unless specifically indicated above      Objective:   BP 140/81   Pulse (!) 105   Temp 98.7 F (37.1 C) (Temporal)   Ht 5\' 8"  (1.727 m)   Wt 232 lb 3.2 oz (105.3 kg)   SpO2 97%   BMI 35.31 kg/m   Wt Readings from Last 3 Encounters:  03/15/19 232 lb 3.2 oz (105.3 kg)  08/07/18 247 lb (112 kg)  06/30/18 247 lb (112 kg)    Physical Exam Vitals and nursing note reviewed.  Constitutional:      General: He is not in acute distress.  Appearance: He is well-developed. He is not diaphoretic.  Eyes:     General: No scleral icterus.    Conjunctiva/sclera: Conjunctivae normal.  Neck:     Thyroid: No thyromegaly.  Skin:    General: Skin is warm and dry.     Findings: Erythema (Erythema and warmth and induration of about 4-1/2 cm sized pocket under left axilla consistent with an abscess) and lesion (Hordeolum under left eyelid on the lower part in the center) present. No rash.  Neurological:     Mental Status: He is alert and oriented to person, place, and time.     Coordination: Coordination normal.  Psychiatric:        Behavior: Behavior normal.     I&D: Region was anesthetized with topical Hurricaine spray. Incision was made on anterior medial aspect of the open wound. Significant serosanguineous and purulent drainage was exuded. Culture was taken.  Q-tip was used to probe the area and break apart any loculations.  Simple dressing was placed over top. Bleeding was minimal and patient tolerated procedure well.    Assessment & Plan:   Problem List Items Addressed This Visit    None    Visit Diagnoses    Abscess of left axilla    -  Primary   Relevant Orders   Anaerobic and Aerobic Culture   Hordeolum externum of left lower eyelid          Patient was instructed to keep abscess flowing and complete the antibiotic and follow-up in a week, keep blood sugars under control as well. Follow up plan: Return in about 2 weeks (around 03/29/2019), or if symptoms worsen or fail to improve, for PCP follow-up for diabetes.  Counseling provided for all of the vaccine components Orders Placed This Encounter  Procedures  . Anaerobic and Aerobic Culture    Caryl Pina, MD [facility] 03/15/2019, 4:24 PM

## 2019-03-15 NOTE — Patient Instructions (Signed)
Keep abscess draining and use warm compresses if not draining, finish full course of antibiotic and follow-up in a week and a half.

## 2019-03-19 LAB — ANAEROBIC AND AEROBIC CULTURE

## 2019-03-28 ENCOUNTER — Other Ambulatory Visit: Payer: Self-pay | Admitting: Family

## 2019-03-28 DIAGNOSIS — K219 Gastro-esophageal reflux disease without esophagitis: Secondary | ICD-10-CM

## 2019-03-30 DIAGNOSIS — Z79899 Other long term (current) drug therapy: Secondary | ICD-10-CM | POA: Diagnosis not present

## 2019-03-30 DIAGNOSIS — M5136 Other intervertebral disc degeneration, lumbar region: Secondary | ICD-10-CM | POA: Diagnosis not present

## 2019-03-30 DIAGNOSIS — G894 Chronic pain syndrome: Secondary | ICD-10-CM | POA: Diagnosis not present

## 2019-04-13 ENCOUNTER — Other Ambulatory Visit: Payer: Self-pay

## 2019-04-13 ENCOUNTER — Other Ambulatory Visit: Payer: Self-pay | Admitting: Family Medicine

## 2019-04-13 ENCOUNTER — Other Ambulatory Visit: Payer: Self-pay | Admitting: Family

## 2019-04-13 DIAGNOSIS — J441 Chronic obstructive pulmonary disease with (acute) exacerbation: Secondary | ICD-10-CM

## 2019-04-16 ENCOUNTER — Ambulatory Visit: Payer: Medicare Other | Admitting: Family

## 2019-04-18 ENCOUNTER — Encounter: Payer: Self-pay | Admitting: Family

## 2019-04-27 ENCOUNTER — Other Ambulatory Visit: Payer: Self-pay | Admitting: Family

## 2019-04-27 DIAGNOSIS — M545 Low back pain: Secondary | ICD-10-CM | POA: Diagnosis not present

## 2019-04-27 DIAGNOSIS — Z79899 Other long term (current) drug therapy: Secondary | ICD-10-CM | POA: Diagnosis not present

## 2019-04-27 DIAGNOSIS — G894 Chronic pain syndrome: Secondary | ICD-10-CM | POA: Diagnosis not present

## 2019-04-27 DIAGNOSIS — G8929 Other chronic pain: Secondary | ICD-10-CM | POA: Diagnosis not present

## 2019-04-27 DIAGNOSIS — M25512 Pain in left shoulder: Secondary | ICD-10-CM | POA: Diagnosis not present

## 2019-04-27 DIAGNOSIS — G4700 Insomnia, unspecified: Secondary | ICD-10-CM

## 2019-05-03 ENCOUNTER — Other Ambulatory Visit: Payer: Self-pay | Admitting: Family

## 2019-05-07 DIAGNOSIS — M25512 Pain in left shoulder: Secondary | ICD-10-CM | POA: Diagnosis not present

## 2019-05-14 ENCOUNTER — Telehealth: Payer: Self-pay | Admitting: Family

## 2019-05-14 NOTE — Telephone Encounter (Signed)
Appointment scheduled 05/16/2019 at 4:10 pm with [name], patient aware.

## 2019-05-17 ENCOUNTER — Ambulatory Visit: Payer: Medicare Other | Admitting: Family

## 2019-05-17 DIAGNOSIS — M79674 Pain in right toe(s): Secondary | ICD-10-CM | POA: Diagnosis not present

## 2019-05-17 DIAGNOSIS — L03031 Cellulitis of right toe: Secondary | ICD-10-CM | POA: Diagnosis not present

## 2019-05-25 ENCOUNTER — Ambulatory Visit: Payer: Medicare Other | Admitting: Family

## 2019-05-28 ENCOUNTER — Other Ambulatory Visit: Payer: Self-pay | Admitting: Family

## 2019-05-28 DIAGNOSIS — Z79899 Other long term (current) drug therapy: Secondary | ICD-10-CM | POA: Diagnosis not present

## 2019-05-29 ENCOUNTER — Encounter: Payer: Self-pay | Admitting: Family Medicine

## 2019-05-29 ENCOUNTER — Encounter: Payer: Self-pay | Admitting: Family

## 2019-05-29 ENCOUNTER — Other Ambulatory Visit: Payer: Self-pay

## 2019-05-29 ENCOUNTER — Ambulatory Visit (INDEPENDENT_AMBULATORY_CARE_PROVIDER_SITE_OTHER): Payer: Medicare Other | Admitting: Family Medicine

## 2019-05-29 VITALS — BP 129/75 | HR 110 | Temp 97.8°F | Ht 68.0 in | Wt 225.0 lb

## 2019-05-29 DIAGNOSIS — S91101D Unspecified open wound of right great toe without damage to nail, subsequent encounter: Secondary | ICD-10-CM

## 2019-05-29 MED ORDER — TRESIBA FLEXTOUCH 200 UNIT/ML ~~LOC~~ SOPN: PEN_INJECTOR | SUBCUTANEOUS | 0 refills | Status: DC

## 2019-05-29 NOTE — Progress Notes (Signed)
Assessment & Plan:  1. Open wound of right great toe, subsequent encounter - Reassurance provided that his toe is not infected at this time.  He is wearing crocs with no socks or bandage over the wound.  We discussed keeping the wound covered during the day and leaving it open to air at night.  I advised that he apply the wound salve that Dr. Irving Shows gave him and cover the area with a dry dressing.  Encouraged him to keep his follow-up appointment with Dr. Irving Shows as well.   Follow up plan: No follow-ups on file.  Hendricks Limes, MSN, APRN, FNP-C [facility]  Subjective:   Patient ID: Charles Pennington, male    DOB: 05-07-1965, 54 y.o.   MRN: [id number]  HPI: Charles Pennington is a 54 y.o. male presenting on 05/29/2019 for infected toe (Bottom of right big toe x 2 weeks.  Patient went to see Dr. Irving Shows and was given cephalexin and it did not help.)  Patient is concerned about a wound on his right great toe that first appeared 2 weeks ago.  He reports when he first noticed it his mother wrapped it in something which she kept on for a few days.  When he took it off the toe was swollen, erythematous, and had drainage coming from it that smelled horrible.  He went to see Dr. Irving Shows and was given cephalexin.  He states it did not help although the swelling, surrounding erythema, and drainage have resolved.   ROS: Negative unless specifically indicated above in HPI.   Relevant past medical history reviewed and updated as indicated.   Allergies and medications reviewed and updated.   Current Outpatient Medications:  .  albuterol (PROVENTIL) (2.5 MG/3ML) 0.083% nebulizer solution, NEBULIZE 1 VIAL EVERY 6 HOURS AS NEEDED FOR WHEEZING OR SHORTNESS OF BREATH, Disp: 180 mL, Rfl: 0 .  amLODipine (NORVASC) 5 MG tablet, TAKE 1 TABLET (5 MG TOTAL) BY MOUTH DAILY., Disp: 90 tablet, Rfl: 0 .  aspirin EC 81 MG tablet, Take 1 tablet (81 mg total) by mouth daily., Disp: 90 tablet, Rfl:  1 .  atorvastatin (LIPITOR) 80 MG tablet, Take 1 tablet (80 mg total) by mouth daily. (Needs to be seen before next refill), Disp: 30 tablet, Rfl: 0 .  baclofen (LIORESAL) 10 MG tablet, , Disp: , Rfl:  .  BELBUCA 450 MCG FILM, , Disp: , Rfl:  .  blood glucose meter kit and supplies, Dispense based on patient and insurance preference. Test sugar BID and as needed, Disp: 1 each, Rfl: 0 .  CONTOUR NEXT TEST test strip, Check blood sugar twice daily Dx E11.9, Disp: 200 strip, Rfl: 3 .  dapagliflozin propanediol (FARXIGA) 10 MG TABS tablet, Take 10 mg by mouth daily., Disp: 90 tablet, Rfl: 3 .  Dulaglutide (TRULICITY) 1.5 WO/0.3OZ SOPN, INJECT 1.5 MG SUB-Q ONCE A WEEK, Disp: 12 pen, Rfl: 6 .  fenofibrate 160 MG tablet, TAKE 1 TABLET (160 MG TOTAL) BY MOUTH DAILY., Disp: 90 tablet, Rfl: 0 .  fluticasone furoate-vilanterol (BREO ELLIPTA) 100-25 MCG/INH AEPB, Inhale 1 puff into the lungs daily. (Needs to be seen before next refill), Disp: 60 each, Rfl: 0 .  Fluticasone-Umeclidin-Vilant (TRELEGY ELLIPTA) 100-62.5-25 MCG/INH AEPB, Inhale 1 puff into the lungs daily., Disp: 28 each, Rfl: 2 .  HYDROcodone-acetaminophen (NORCO/VICODIN) 5-325 MG tablet, , Disp: , Rfl:  .  ibuprofen (ADVIL,MOTRIN) 800 MG tablet, , Disp: , Rfl:  .  levothyroxine (SYNTHROID, LEVOTHROID) 175 MCG tablet,  TAKE (1) TABLET DAILY BE- FORE BREAKFAST., Disp: 30 tablet, Rfl: 0 .  lisinopril (PRINIVIL,ZESTRIL) 20 MG tablet, Take 20 mg by mouth daily., Disp: , Rfl:  .  metFORMIN (GLUCOPHAGE-XR) 750 MG 24 hr tablet, TAKE 2 TABLET WITH MORNING MEAL, Disp: 180 tablet, Rfl: 0 .  omeprazole (PRILOSEC) 20 MG capsule, TAKE (1) CAPSULE DAILY, Disp: 90 capsule, Rfl: 0 .  PARoxetine (PAXIL) 40 MG tablet, TAKE 1 TABLET DAILY, Disp: 90 tablet, Rfl: 0 .  pregabalin (LYRICA) 150 MG capsule, TAKE 2 CAPSULES EVERY MORNING AND 2 CAPSULES EVERY EVENING, Disp: 120 capsule, Rfl: 0 .  PROAIR HFA 108 (90 Base) MCG/ACT inhaler, INHALE 2 PUFFS INTO THE LUNGS EVERY  6 (SIX) HOURS AS NEEDED FOR WHEEZING., Disp: 8.5 g, Rfl: 0 .  TRESIBA FLEXTOUCH 200 UNIT/ML FlexTouch Pen, Inject 80-90 Units into the skin daily. (Needs to be seen), Disp: 18 mL, Rfl: 0 .  ULTICARE MICRO PEN NEEDLES 32G X 4 MM MISC, Use 2 times a day To test blood glucose, Disp: 100 each, Rfl: 11 .  zolpidem (AMBIEN) 10 MG tablet, Take 1 tablet (10 mg total) by mouth at bedtime., Disp: 30 tablet, Rfl: 5  Allergies  Allergen Reactions  . Hydrocodone-Acetaminophen Itching and Other (See Comments)    Pt is able to tolerate with Benadryl.   . Gadolinium Derivatives Nausea And Vomiting    Objective:   BP 129/75   Pulse (!) 110   Temp 97.8 F (36.6 C) (Temporal)   Ht _0  (1.727 m)   Wt 225 lb (102.1 kg)   SpO2 94%   BMI 34.21 kg/m    Physical Exam Vitals reviewed.  Constitutional:      General: He is not in acute distress.    Appearance: Normal appearance. He is not ill-appearing, toxic-appearing or diaphoretic.  HENT:     Head: Normocephalic and atraumatic.  Eyes:     General: No scleral icterus.       Right eye: No discharge.        Left eye: No discharge.     Conjunctiva/sclera: Conjunctivae normal.  Cardiovascular:     Rate and Rhythm: Normal rate.  Pulmonary:     Effort: Pulmonary effort is normal. No respiratory distress.  Musculoskeletal:        General: Normal range of motion.     Cervical back: Normal range of motion.  Skin:    General: Skin is warm and dry.     Findings: Wound (tip of right great toe without erythema, warmth, swelling, or drainage. The wound bed has granulation tissue. ) present.  Neurological:     Mental Status: He is alert and oriented to person, place, and time. Mental status is at baseline.  Psychiatric:        Mood and Affect: Mood normal.        Behavior: Behavior normal.        Thought Content: Thought content normal.        Judgment: Judgment normal.

## 2019-06-02 ENCOUNTER — Encounter: Payer: Self-pay | Admitting: Family Medicine

## 2019-06-02 ENCOUNTER — Other Ambulatory Visit: Payer: Self-pay | Admitting: Family

## 2019-06-02 DIAGNOSIS — F331 Major depressive disorder, recurrent, moderate: Secondary | ICD-10-CM

## 2019-06-02 DIAGNOSIS — F411 Generalized anxiety disorder: Secondary | ICD-10-CM

## 2019-06-04 NOTE — Telephone Encounter (Signed)
OV 06/07/19

## 2019-06-07 ENCOUNTER — Other Ambulatory Visit: Payer: Self-pay

## 2019-06-07 ENCOUNTER — Ambulatory Visit (INDEPENDENT_AMBULATORY_CARE_PROVIDER_SITE_OTHER): Payer: Medicare Other | Admitting: Family

## 2019-06-07 ENCOUNTER — Encounter: Payer: Self-pay | Admitting: Family

## 2019-06-07 ENCOUNTER — Other Ambulatory Visit: Payer: Self-pay | Admitting: Family

## 2019-06-07 VITALS — BP 134/92 | HR 91 | Temp 97.4°F | Ht 68.0 in | Wt 234.8 lb

## 2019-06-07 DIAGNOSIS — I10 Essential (primary) hypertension: Secondary | ICD-10-CM

## 2019-06-07 DIAGNOSIS — E119 Type 2 diabetes mellitus without complications: Secondary | ICD-10-CM | POA: Diagnosis not present

## 2019-06-07 DIAGNOSIS — E1169 Type 2 diabetes mellitus with other specified complication: Secondary | ICD-10-CM | POA: Diagnosis not present

## 2019-06-07 DIAGNOSIS — E039 Hypothyroidism, unspecified: Secondary | ICD-10-CM

## 2019-06-07 DIAGNOSIS — E1159 Type 2 diabetes mellitus with other circulatory complications: Secondary | ICD-10-CM

## 2019-06-07 DIAGNOSIS — F411 Generalized anxiety disorder: Secondary | ICD-10-CM | POA: Diagnosis not present

## 2019-06-07 DIAGNOSIS — Z8673 Personal history of transient ischemic attack (TIA), and cerebral infarction without residual deficits: Secondary | ICD-10-CM | POA: Diagnosis not present

## 2019-06-07 DIAGNOSIS — J441 Chronic obstructive pulmonary disease with (acute) exacerbation: Secondary | ICD-10-CM

## 2019-06-07 DIAGNOSIS — Z9114 Patient's other noncompliance with medication regimen: Secondary | ICD-10-CM

## 2019-06-07 DIAGNOSIS — F331 Major depressive disorder, recurrent, moderate: Secondary | ICD-10-CM

## 2019-06-07 DIAGNOSIS — E785 Hyperlipidemia, unspecified: Secondary | ICD-10-CM

## 2019-06-07 DIAGNOSIS — K219 Gastro-esophageal reflux disease without esophagitis: Secondary | ICD-10-CM | POA: Diagnosis not present

## 2019-06-07 DIAGNOSIS — J440 Chronic obstructive pulmonary disease with acute lower respiratory infection: Secondary | ICD-10-CM

## 2019-06-07 DIAGNOSIS — G4700 Insomnia, unspecified: Secondary | ICD-10-CM

## 2019-06-07 DIAGNOSIS — Z794 Long term (current) use of insulin: Secondary | ICD-10-CM

## 2019-06-07 DIAGNOSIS — R062 Wheezing: Secondary | ICD-10-CM

## 2019-06-07 DIAGNOSIS — Z91148 Patient's other noncompliance with medication regimen for other reason: Secondary | ICD-10-CM

## 2019-06-07 DIAGNOSIS — J209 Acute bronchitis, unspecified: Secondary | ICD-10-CM

## 2019-06-07 DIAGNOSIS — F1120 Opioid dependence, uncomplicated: Secondary | ICD-10-CM

## 2019-06-07 LAB — BAYER DCA HB A1C WAIVED: HB A1C (BAYER DCA - WAIVED): 8.2 % — ABNORMAL HIGH

## 2019-06-07 MED ORDER — ASPIRIN EC 81 MG PO TBEC: 81.0000 mg | DELAYED_RELEASE_TABLET | Freq: Every day | ORAL | 1 refills | Status: DC

## 2019-06-07 MED ORDER — TRULICITY 1.5 MG/0.5ML ~~LOC~~ SOAJ: SUBCUTANEOUS | 6 refills | Status: DC

## 2019-06-07 MED ORDER — LEVOTHYROXINE SODIUM 175 MCG PO TABS: ORAL_TABLET | ORAL | 1 refills | Status: DC

## 2019-06-07 MED ORDER — TRESIBA FLEXTOUCH 200 UNIT/ML ~~LOC~~ SOPN: PEN_INJECTOR | SUBCUTANEOUS | 6 refills | Status: DC

## 2019-06-07 MED ORDER — OMEPRAZOLE 20 MG PO CPDR: DELAYED_RELEASE_CAPSULE | ORAL | 1 refills | Status: DC

## 2019-06-07 MED ORDER — PREGABALIN 150 MG PO CAPS: 150.0000 mg | ORAL_CAPSULE | Freq: Two times a day (BID) | ORAL | 1 refills | Status: DC

## 2019-06-07 MED ORDER — ATORVASTATIN CALCIUM 80 MG PO TABS: 80.0000 mg | ORAL_TABLET | Freq: Every day | ORAL | 0 refills | Status: DC

## 2019-06-07 MED ORDER — FARXIGA 10 MG PO TABS: 10.0000 mg | ORAL_TABLET | Freq: Every day | ORAL | 3 refills | Status: DC

## 2019-06-07 MED ORDER — ALBUTEROL SULFATE HFA 108 (90 BASE) MCG/ACT IN AERS: INHALATION_SPRAY | RESPIRATORY_TRACT | 0 refills | Status: DC

## 2019-06-07 MED ORDER — PAROXETINE HCL 40 MG PO TABS: 40.0000 mg | ORAL_TABLET | Freq: Every day | ORAL | 2 refills | Status: DC

## 2019-06-07 MED ORDER — TRELEGY ELLIPTA 100-62.5-25 MCG/INH IN AEPB: 1.0000 | INHALATION_SPRAY | Freq: Every day | RESPIRATORY_TRACT | 2 refills | Status: DC

## 2019-06-07 MED ORDER — LISINOPRIL 20 MG PO TABS: 20.0000 mg | ORAL_TABLET | Freq: Every day | ORAL | 2 refills | Status: DC

## 2019-06-07 MED ORDER — METFORMIN HCL ER 750 MG PO TB24: ORAL_TABLET | ORAL | 2 refills | Status: DC

## 2019-06-07 NOTE — Progress Notes (Signed)
Subjective:    Patient ID: Charles Pennington, male    DOB: 1965/10/22, 54 y.o.   MRN: [id number]  Chief Complaint  Patient presents with  . Medical Management of Chronic Issues   Pt presents to the office today for chronic follow up. He is a poor historian. He states he is only taking 8 pills and sometimes he takes his Tresbia 80 units BID and some times he doesn't take it at all. He uses his Memory Dance "every now and then".   Pt had a CVA when he turned 31 years. PT states he continue to have "balance issues related to the CVA". PT states this is stable.   Pt is noncompliant with his medicationsand takes "most of his pills most days".   He is followed by [facility] every month for chronic back pain and diabetic neuropathy. Diabetes He presents for his follow-up diabetic visit. He has type 2 diabetes mellitus. His disease course has been stable. There are no hypoglycemic associated symptoms. Associated symptoms include blurred vision, fatigue and foot paresthesias. Pertinent negatives for diabetes include no visual change. Symptoms are stable. Risk factors for coronary artery disease include dyslipidemia, diabetes mellitus, male sex, hypertension and sedentary lifestyle. He is following a generally unhealthy diet. His overall blood glucose range is 110-130 mg/dl. Eye exam is not current.  Hypertension This is a chronic problem. The current episode started more than 1 year ago. The problem has been waxing and waning since onset. The problem is uncontrolled. Associated symptoms include blurred vision, malaise/fatigue and shortness of breath. Pertinent negatives include no peripheral edema. Past treatments include ACE inhibitors. The current treatment provides moderate improvement. Hypertensive end-organ damage includes CAD/MI. There is no history of heart failure. Identifiable causes of hypertension include a thyroid problem.  Thyroid Problem Presents for follow-up visit. Symptoms include  fatigue. Patient reports no constipation, depressed mood or visual change. The symptoms have been stable. There is no history of heart failure.  Depression        This is a chronic problem.  The current episode started more than 1 year ago.   The onset quality is gradual.   The problem occurs intermittently.  The problem has been waxing and waning since onset.  Associated symptoms include fatigue, irritable and sad.  Associated symptoms include no helplessness and no hopelessness.  Past treatments include SSRIs - Selective serotonin reuptake inhibitors.  Past medical history includes thyroid problem.   Nicotine Dependence Presents for follow-up visit. Symptoms include fatigue. His urge triggers include company of smokers. The symptoms have been stable. He smokes < 1/2 a pack of cigarettes per day.      Review of Systems  Constitutional: Positive for fatigue and malaise/fatigue.  Eyes: Positive for blurred vision.  Respiratory: Positive for shortness of breath.   Gastrointestinal: Negative for constipation.  Psychiatric/Behavioral: Positive for depression.  All other systems reviewed and are negative.      Objective:   Physical Exam Vitals reviewed.  Constitutional:      General: [name]. He is not in acute distress.    Appearance: He is well-developed.  HENT:     Head: Normocephalic.     Right Ear: Tympanic membrane normal.     Left Ear: Tympanic membrane normal.  Eyes:     General:        Right eye: No discharge.        Left eye: No discharge.     Pupils: Pupils are equal, round, and reactive  to light.  Neck:     Thyroid: No thyromegaly.  Cardiovascular:     Rate and Rhythm: Normal rate and regular rhythm.     Heart sounds: Normal heart sounds. No murmur.  Pulmonary:     Effort: Pulmonary effort is normal. No respiratory distress.     Breath sounds: Wheezing and rhonchi present.  Abdominal:     General: Bowel sounds are normal. There is no distension.      Palpations: Abdomen is soft.     Tenderness: There is no abdominal tenderness.  Musculoskeletal:        General: No tenderness. Normal range of motion.     Cervical back: Normal range of motion and neck supple.  Skin:    General: Skin is warm and dry.     Findings: No erythema or rash.     Comments: Right great toe ulcer present, serous drainage present  Neurological:     Mental Status: He is alert and oriented to person, place, and time.     Cranial Nerves: No cranial nerve deficit.     Deep Tendon Reflexes: Reflexes are normal and symmetric.  Psychiatric:        Behavior: Behavior normal.        Thought Content: Thought content normal.        Judgment: Judgment normal.       BP (!) 134/92   Pulse 91   Temp (!) 97.4 F (36.3 C) (Temporal)   Ht 5' 8"  (1.727 m)   Wt 234 lb 12.8 oz (106.5 kg)   SpO2 97%   BMI 35.70 kg/m      Assessment & Plan:  Charles Pennington comes in today with chief complaint of Medical Management of Chronic Issues   Diagnosis and orders addressed:  1. Hypertension associated with diabetes ([place]) - CMP14+EGFR - CBC with Differential/Platelet  2. Gastroesophageal reflux disease, unspecified whether esophagitis present - CMP14+EGFR - CBC with Differential/Platelet  3. Type 2 diabetes mellitus treated with insulin ([facility]) - Bayer DCA Hb A1c Waived - CMP14+EGFR - CBC with Differential/Platelet - Microalbumin / creatinine urine ratio - Dulaglutide (TRULICITY) 1.5 AJ/2.8NO SOPN; INJECT 1.5 MG SUB-Q ONCE A WEEK  Dispense: 12 pen; Refill: 6  4. Hypothyroidism, unspecified type - CMP14+EGFR - CBC with Differential/Platelet - TSH  5. Hyperlipidemia associated with type 2 diabetes mellitus ([facility]CC) - CMP14+EGFR - CBC with Differential/Platelet - Lipid panel  6. GAD (generalized anxiety disorder) - CMP14+EGFR - CBC with Differential/Platelet - PARoxetine (PAXIL) 40 MG tablet; Take 1 tablet (40 mg total) by mouth daily.  Dispense: 90 tablet;  Refill: 2  7. Moderate episode of recurrent major depressive disorder ([facility]) - CMP14+EGFR - CBC with Differential/Platelet - PARoxetine (PAXIL) 40 MG tablet; Take 1 tablet (40 mg total) by mouth daily.  Dispense: 90 tablet; Refill: 2  8. History of CVA (cerebrovascular accident) - CMP14+EGFR - CBC with Differential/Platelet  9. Morbid obesity ([place]) - CMP14+EGFR - CBC with Differential/Platelet  10. Noncompliance with medications - CMP14+EGFR - CBC with Differential/Platelet  11. Uncomplicated opioid dependence ([place]) - CMP14+EGFR - CBC with Differential/Platelet  12. COPD exacerbation ([place]) - CMP14+EGFR - CBC with Differential/Platelet  13. Acute bronchitis with COPD ([place]) - Fluticasone-Umeclidin-Vilant (TRELEGY ELLIPTA) 100-62.5-25 MCG/INH AEPB; Inhale 1 puff into the lungs daily.  Dispense: 28 each; Refill: 2  14. Wheezing - Fluticasone-Umeclidin-Vilant (TRELEGY ELLIPTA) 100-62.5-25 MCG/INH AEPB; Inhale 1 puff into the lungs daily.  Dispense: 28 each; Refill: 2  15. Gastroesophageal reflux disease - omeprazole (  PRILOSEC) 20 MG capsule; TAKE (1) CAPSULE DAILY  Dispense: 90 capsule; Refill: 1  Long discussion with patient, approx 30 mins discussing the importance of taking medications daily and not taking extra insulin. Strict low carb. Will follow up in 1 week. We called the pharmacy and got a list of medication that he has filled the last 3 months. He has not taken any BP medication. Smoking cessation discussed. I have also made him an appointment with our Clinical Pharmacists.  Labs pending Health Maintenance reviewed Diet and exercise encouraged  Follow up plan: 1 week    Evelina Dun, FNP

## 2019-06-07 NOTE — Patient Instructions (Signed)

## 2019-06-08 ENCOUNTER — Telehealth: Payer: Self-pay | Admitting: Family

## 2019-06-08 DIAGNOSIS — G4700 Insomnia, unspecified: Secondary | ICD-10-CM

## 2019-06-08 LAB — CMP14+EGFR
ALT: 14 IU/L (ref 0–44)
AST: 19 IU/L (ref 0–40)
Albumin/Globulin Ratio: 1.8 (ref 1.2–2.2)
Albumin: 4.3 g/dL (ref 3.8–4.9)
Alkaline Phosphatase: 59 IU/L (ref 39–117)
BUN/Creatinine Ratio: 14 (ref 9–20)
BUN: 16 mg/dL (ref 6–24)
Bilirubin Total: <0.2 mg/dL (ref 0.0–1.2)
CO2: 23 mmol/L (ref 20–29)
Calcium: 9.8 mg/dL (ref 8.7–10.2)
Chloride: 98 mmol/L (ref 96–106)
Creatinine, Ser: 1.18 mg/dL (ref 0.76–1.27)
GFR calc Af Amer: 82 mL/min/{1.73_m2} (ref >59)
GFR calc non Af Amer: 71 mL/min/{1.73_m2} (ref >59)
Globulin, Total: 2.4 g/dL (ref 1.5–4.5)
Glucose: 424 mg/dL (ref 65–99)
Potassium: 4.7 mmol/L (ref 3.5–5.2)
Sodium: 135 mmol/L (ref 134–144)
Total Protein: 6.7 g/dL (ref 6.0–8.5)

## 2019-06-08 LAB — CBC WITH DIFFERENTIAL/PLATELET
Basophils Absolute: 0.1 10*3/uL (ref 0.0–0.2)
Basos: 1 %
EOS (ABSOLUTE): 0.6 10*3/uL — ABNORMAL HIGH (ref 0.0–0.4)
Eos: 8 %
Hematocrit: 50.3 % (ref 37.5–51.0)
Hemoglobin: 16.8 g/dL (ref 13.0–17.7)
Immature Grans (Abs): 0 10*3/uL (ref 0.0–0.1)
Immature Granulocytes: 0 %
Lymphocytes Absolute: 2.7 10*3/uL (ref 0.7–3.1)
Lymphs: 40 %
MCH: 32.1 pg (ref 26.6–33.0)
MCHC: 33.4 g/dL (ref 31.5–35.7)
MCV: 96 fL (ref 79–97)
Monocytes Absolute: 0.5 10*3/uL (ref 0.1–0.9)
Monocytes: 7 %
Neutrophils Absolute: 3 10*3/uL (ref 1.4–7.0)
Neutrophils: 44 %
Platelets: 211 10*3/uL (ref 150–450)
RBC: 5.24 x10E6/uL (ref 4.14–5.80)
RDW: 12.1 % (ref 11.6–15.4)
WBC: 6.9 10*3/uL (ref 3.4–10.8)

## 2019-06-08 LAB — MICROALBUMIN / CREATININE URINE RATIO
Creatinine, Urine: 22.5 mg/dL
Microalb/Creat Ratio: <13 mg/g creat (ref 0–29)
Microalbumin, Urine: <3 ug/mL

## 2019-06-08 LAB — LIPID PANEL
Chol/HDL Ratio: 2.5 ratio (ref 0.0–5.0)
Cholesterol, Total: 122 mg/dL (ref 100–199)
HDL: 48 mg/dL (ref >39)
LDL Chol Calc (NIH): 51 mg/dL (ref 0–99)
Triglycerides: 132 mg/dL (ref 0–149)
VLDL Cholesterol Cal: 23 mg/dL (ref 5–40)

## 2019-06-08 LAB — TSH: TSH: 14.2 u[IU]/mL — ABNORMAL HIGH (ref 0.450–4.500)

## 2019-06-08 MED ORDER — ZOLPIDEM TARTRATE 10 MG PO TABS: 10.0000 mg | ORAL_TABLET | Freq: Every day | ORAL | 5 refills | Status: DC

## 2019-06-08 NOTE — Telephone Encounter (Signed)
Ambien Prescription sent to pharmacy   

## 2019-06-15 ENCOUNTER — Ambulatory Visit: Payer: Medicare Other | Admitting: Pharmacist

## 2019-06-18 ENCOUNTER — Ambulatory Visit: Payer: Medicare Other | Admitting: Family

## 2019-06-25 ENCOUNTER — Other Ambulatory Visit: Payer: Self-pay | Admitting: Family

## 2019-06-25 NOTE — Telephone Encounter (Signed)
Received a call from [facility] about Lyrica.  Stew states his previous rx was 2 capsules BID and the one that was recently sent over was 1 capsule BID.  Please advise and if he should still be on 2 BID please re send pending rx.

## 2019-06-27 ENCOUNTER — Other Ambulatory Visit: Payer: Self-pay | Admitting: Family

## 2019-06-27 DIAGNOSIS — G894 Chronic pain syndrome: Secondary | ICD-10-CM | POA: Diagnosis not present

## 2019-06-27 DIAGNOSIS — Z79899 Other long term (current) drug therapy: Secondary | ICD-10-CM | POA: Diagnosis not present

## 2019-06-27 DIAGNOSIS — M545 Low back pain: Secondary | ICD-10-CM | POA: Diagnosis not present

## 2019-06-27 DIAGNOSIS — G8929 Other chronic pain: Secondary | ICD-10-CM | POA: Diagnosis not present

## 2019-06-29 ENCOUNTER — Other Ambulatory Visit: Payer: Self-pay

## 2019-06-29 ENCOUNTER — Encounter: Payer: Self-pay | Admitting: Family

## 2019-06-29 ENCOUNTER — Ambulatory Visit (INDEPENDENT_AMBULATORY_CARE_PROVIDER_SITE_OTHER): Payer: Medicare PPO | Admitting: Family

## 2019-06-29 VITALS — BP 117/83 | HR 114 | Temp 97.3°F | Ht 68.0 in | Wt 228.2 lb

## 2019-06-29 DIAGNOSIS — E11621 Type 2 diabetes mellitus with foot ulcer: Secondary | ICD-10-CM

## 2019-06-29 DIAGNOSIS — E1142 Type 2 diabetes mellitus with diabetic polyneuropathy: Secondary | ICD-10-CM | POA: Diagnosis not present

## 2019-06-29 DIAGNOSIS — Z794 Long term (current) use of insulin: Secondary | ICD-10-CM

## 2019-06-29 DIAGNOSIS — E119 Type 2 diabetes mellitus without complications: Secondary | ICD-10-CM

## 2019-06-29 DIAGNOSIS — L97519 Non-pressure chronic ulcer of other part of right foot with unspecified severity: Secondary | ICD-10-CM | POA: Diagnosis not present

## 2019-06-29 MED ORDER — TRESIBA FLEXTOUCH 200 UNIT/ML ~~LOC~~ SOPN: PEN_INJECTOR | SUBCUTANEOUS | 6 refills | Status: DC

## 2019-06-29 NOTE — Patient Instructions (Signed)
Diabetes Mellitus and Foot Care Foot care is an important part of your health, especially when you have diabetes. Diabetes may cause you to have problems because of poor blood flow (circulation) to your feet and legs, which can cause your skin to:  Become thinner and drier.  Break more easily.  Heal more slowly.  Peel and crack. You may also have nerve damage (neuropathy) in your legs and feet, causing decreased feeling in them. This means that you may not notice minor injuries to your feet that could lead to more serious problems. Noticing and addressing any potential problems early is the best way to prevent future foot problems. How to care for your feet Foot hygiene  Wash your feet daily with warm water and mild soap. Do not use hot water. Then, pat your feet and the areas between your toes until they are completely dry. Do not soak your feet as this can dry your skin.  Trim your toenails straight across. Do not dig under them or around the cuticle. File the edges of your nails with an emery board or nail file.  Apply a moisturizing lotion or petroleum jelly to the skin on your feet and to dry, brittle toenails. Use lotion that does not contain alcohol and is unscented. Do not apply lotion between your toes. Shoes and socks  Wear clean socks or stockings every day. Make sure they are not too tight. Do not wear knee-high stockings since they may decrease blood flow to your legs.  Wear shoes that fit properly and have enough cushioning. Always look in your shoes before you put them on to be sure there are no objects inside.  To break in new shoes, wear them for just a few hours a day. This prevents injuries on your feet. Wounds, scrapes, corns, and calluses  Check your feet daily for blisters, cuts, bruises, sores, and redness. If you cannot see the bottom of your feet, use a mirror or ask someone for help.  Do not cut corns or calluses or try to remove them with medicine.  If you  find a minor scrape, cut, or break in the skin on your feet, keep it and the skin around it clean and dry. You may clean these areas with mild soap and water. Do not clean the area with peroxide, alcohol, or iodine.  If you have a wound, scrape, corn, or callus on your foot, look at it several times a day to make sure it is healing and not infected. Check for: ? Redness, swelling, or pain. ? Fluid or blood. ? Warmth. ? Pus or a bad smell. General instructions  Do not cross your legs. This may decrease blood flow to your feet.  Do not use heating pads or hot water bottles on your feet. They may burn your skin. If you have lost feeling in your feet or legs, you may not know this is happening until it is too late.  Protect your feet from hot and cold by wearing shoes, such as at the beach or on hot pavement.  Schedule a complete foot exam at least once a year (annually) or more often if you have foot problems. If you have foot problems, report any cuts, sores, or bruises to your health care provider immediately. Contact a health care provider if:  You have a medical condition that increases your risk of infection and you have any cuts, sores, or bruises on your feet.  You have an injury that is not   healing.  You have redness on your legs or feet.  You feel burning or tingling in your legs or feet.  You have pain or cramps in your legs and feet.  Your legs or feet are numb.  Your feet always feel cold.  You have pain around a toenail. Get help right away if:  You have a wound, scrape, corn, or callus on your foot and: ? You have pain, swelling, or redness that gets worse. ? You have fluid or blood coming from the wound, scrape, corn, or callus. ? Your wound, scrape, corn, or callus feels warm to the touch. ? You have pus or a bad smell coming from the wound, scrape, corn, or callus. ? You have a fever. ? You have a red line going up your leg. Summary  Check your feet every day  for cuts, sores, red spots, swelling, and blisters.  Moisturize feet and legs daily.  Wear shoes that fit properly and have enough cushioning.  If you have foot problems, report any cuts, sores, or bruises to your health care provider immediately.  Schedule a complete foot exam at least once a year (annually) or more often if you have foot problems. This information is not intended to replace advice given to you by your health care provider. Make sure you discuss any questions you have with your health care provider. Document Revised: 10/25/2018 Document Reviewed: 03/05/2016 Elsevier Patient Education  2020 [employer] Inc[employer]  

## 2019-06-29 NOTE — Progress Notes (Signed)
Subjective:    Patient ID: Charles Pennington, male    DOB: 05/26/1965, 54 y.o.   MRN: [id number]  Chief Complaint  Patient presents with  . Foot Pain    Foot Pain This is a new problem. The current episode started more than 1 month ago. The problem occurs constantly. The problem has been gradually improving. Pertinent negatives include no change in bowel habit, congestion, coughing, fever, headaches or urinary symptoms. The symptoms are aggravated by standing. The treatment provided mild relief.      Review of Systems  Constitutional: Negative for fever.  HENT: Negative for congestion.   Respiratory: Negative for cough.   Gastrointestinal: Negative for change in bowel habit.  Neurological: Negative for headaches.  All other systems reviewed and are negative.      Objective:   Physical Exam Vitals reviewed.  Constitutional:      General: He is not in acute distress.    Appearance: He is well-developed.  HENT:     Head: Normocephalic.     Right Ear: Tympanic membrane normal.     Left Ear: Tympanic membrane normal.  Eyes:     General:        Right eye: No discharge.        Left eye: No discharge.     Pupils: Pupils are equal, round, and reactive to light.  Neck:     Thyroid: No thyromegaly.  Cardiovascular:     Rate and Rhythm: Normal rate and regular rhythm.     Heart sounds: Normal heart sounds. No murmur.  Pulmonary:     Effort: Pulmonary effort is normal. No respiratory distress.     Breath sounds: Normal breath sounds. No wheezing.  Abdominal:     General: Bowel sounds are normal. There is no distension.     Palpations: Abdomen is soft.     Tenderness: There is no abdominal tenderness.  Musculoskeletal:        General: No tenderness. Normal range of motion.     Cervical back: Normal range of motion and neck supple.  Skin:    General: Skin is warm and dry.     Findings: No erythema or rash.  Neurological:     Mental Status: He is alert and oriented to  person, place, and time.     Cranial Nerves: No cranial nerve deficit.     Deep Tendon Reflexes: Reflexes are normal and symmetric.  Psychiatric:        Behavior: Behavior normal.        Thought Content: Thought content normal.        Judgment: Judgment normal.     BP 117/83   Pulse (!) 114   Temp (!) 97.3 F (36.3 C) (Temporal)   Ht 5\' 8"  (1.727 m)   Wt 228 lb 3.2 oz (103.5 kg)   SpO2 95%   BMI 34.70 kg/m   Diabetic Foot Exam - Simple   Simple Foot Form Diabetic Foot exam was performed with the following findings: Yes 06/29/2019  3:43 PM  Visual Inspection See comments: Yes Sensation Testing See comments: Yes Pulse Check Posterior Tibialis and Dorsalis pulse intact bilaterally: Yes Comments Unstagable  Skin ulcer on right great toe, negative Monofilament          Assessment & Plan:  Charles Pennington comes in today with chief complaint of Foot Pain   Diagnosis and orders addressed:  1. Diabetic ulcer of toe of right foot associated with type 2 diabetes mellitus,  unspecified ulcer stage ([facility]CC) - Ambulatory referral to Podiatry  2. Diabetic polyneuropathy associated with type 2 diabetes mellitus (HCC)  3. Type 2 diabetes mellitus treated with insulin ([facility])   Long discussion with patient. Clinical pharm came into room today to help with medications. Pt has brought  In 20 gallon bucket with medications and assorted pills in the bottom that he states he takes some times. She went over all his bottles with him and labelled accordingly. He will have follow up next week. We will decrease Tresiba to 30 units daily as he is only taking 60 units twice a week right now.  Strict low carb diet Referral pending for Podiatry Wound care discussed RTO in 3 weeks   [name], FNP

## 2019-07-03 ENCOUNTER — Encounter: Payer: Self-pay | Admitting: Pharmacist

## 2019-07-03 ENCOUNTER — Other Ambulatory Visit: Payer: Self-pay

## 2019-07-03 ENCOUNTER — Ambulatory Visit (INDEPENDENT_AMBULATORY_CARE_PROVIDER_SITE_OTHER): Payer: Medicare PPO | Admitting: Pharmacist

## 2019-07-03 DIAGNOSIS — E119 Type 2 diabetes mellitus without complications: Secondary | ICD-10-CM | POA: Diagnosis not present

## 2019-07-03 DIAGNOSIS — Z794 Long term (current) use of insulin: Secondary | ICD-10-CM

## 2019-07-03 MED ORDER — METFORMIN HCL ER 750 MG PO TB24: ORAL_TABLET | ORAL | 2 refills | Status: DC

## 2019-07-03 MED ORDER — TRESIBA FLEXTOUCH 200 UNIT/ML ~~LOC~~ SOPN: PEN_INJECTOR | SUBCUTANEOUS | 3 refills | Status: DC

## 2019-07-03 MED ORDER — CONTOUR NEXT TEST VI STRP: ORAL_STRIP | 3 refills | Status: DC

## 2019-07-03 NOTE — Progress Notes (Signed)
     07/03/2019 Name: Charles Pennington MRN: [id number] DOB: December 23, 1965  Referred by: Junie Spencer, FNP Reason for referral : Diabetes   S:  67 yoM  presents for diabetes evaluation, education, and management Patient was referred and last seen by Primary Care Provider on 06/29/19.  Insurance coverage/medication affordability: dual [employer]/medicaid  Patient denies adherence with medications.  Current diabetes medications include: Trulicity, metformin, tresiba, farxiga  Current hypertension medications include: lisinopril  Current hyperlipidemia medications include: atorvastatin   Patient denies hypoglycemic events.   Patient reported dietary habits: Eats 2-3 meals/day Reviewed diet and suggestions for patient to consider  Breakfast:  Oatmeal cookie Lunch:burgers, sandwiches, etc Dinner:chicken, green beans, potatoes and cornbread Snacks: loves potato chips Drinks:mtn dew, water  Patient-reported exercise habits: n/a  Patient reports nocturia (nighttime urination).  Patient reports neuropathy (nerve pain).  Patient denies visual changes.  Patient sees podiatry for foot exams.  He has a current place on his toe that needs to be examined. Referral to podiatry placed by PCP    O:    Lab Results  Component Value Date   HGBA1C 8.2 (H) 06/07/2019   Lipid Panel     Component Value Date/Time   CHOL 122 06/07/2019 1551   TRIG 132 06/07/2019 1551   HDL 48 06/07/2019 1551   CHOLHDL 2.5 06/07/2019 1551   LDLCALC 51 06/07/2019 1551    Home fasting blood sugars: not checking, needs test strips Contour next EZ  2 hour post-meal/random blood sugars: n/a.    A/P:  Diabetes T2DM currently uncontrolled. Patient is able to verbalize appropriate hypoglycemia management plan. Patient is not adherent with medication. Control is suboptimal due to diet/missed medication doses.  BG today was 195 (s/p oatmeal cookie 2 hrs prior)  -Increased dose of basal insulin Tresiba   (insulin degludec) to 60 units daily (new RX called in)  -Continued GLP1 Trulicity 1.5mg  sq weekly  -Continued SGLT2-I Farxiga (generic name:dapagliflozin) 10mg  daily. Counseled on sick day rules for patient to hold in not eating/vomiting/dehydrated.  -Continue metformin XR once daily  -RX sent to pharmacy for Contour Next EX test strips  -Extensively discussed pathophysiology of diabetes, recommended lifestyle interventions, dietary effects on blood sugar control  -Counseled on s/sx of and management of hypoglycemia  -Next A1C anticipated 3 months (august)    Written patient instructions provided.  Total time in face to face counseling 30 minutes.   Follow up Pharmacist Clinic Visit 4 weeks  05-08-1975, PharmD, BCPS Clinical Pharmacist, [facility]  II Phone [phone]

## 2019-07-09 ENCOUNTER — Other Ambulatory Visit: Payer: Self-pay | Admitting: Family

## 2019-07-25 ENCOUNTER — Other Ambulatory Visit: Payer: Self-pay | Admitting: Family

## 2019-07-26 ENCOUNTER — Ambulatory Visit (INDEPENDENT_AMBULATORY_CARE_PROVIDER_SITE_OTHER): Payer: Medicare PPO

## 2019-07-26 ENCOUNTER — Encounter: Payer: Self-pay | Admitting: Podiatry

## 2019-07-26 ENCOUNTER — Other Ambulatory Visit: Payer: Self-pay

## 2019-07-26 ENCOUNTER — Other Ambulatory Visit: Payer: Self-pay | Admitting: Podiatry

## 2019-07-26 ENCOUNTER — Ambulatory Visit (INDEPENDENT_AMBULATORY_CARE_PROVIDER_SITE_OTHER): Payer: Medicare PPO | Admitting: Podiatry

## 2019-07-26 DIAGNOSIS — E11621 Type 2 diabetes mellitus with foot ulcer: Secondary | ICD-10-CM

## 2019-07-26 DIAGNOSIS — L97519 Non-pressure chronic ulcer of other part of right foot with unspecified severity: Secondary | ICD-10-CM | POA: Diagnosis not present

## 2019-07-26 DIAGNOSIS — M2031 Hallux varus (acquired), right foot: Secondary | ICD-10-CM

## 2019-07-26 NOTE — Progress Notes (Signed)
  Subjective:  Patient ID: Charles Pennington, male    DOB: 1965/07/20,  MRN: [id number]  Chief Complaint  Patient presents with  . Nail Problem    Right big toenail- has been removed twice. second time he noticed his nail lifting and he pulled it and now the nails and toe has harden and draning constantly.    54 y.o. male presents with the above complaint. History confirmed with patient.   Objective:  Physical Exam: warm, good capillary refill, no trophic changes or ulcerative lesions, normal DP and PT pulses and normal sensory exam. Right Foot: pain to palpation hallux IPJ. Functionally stiff. Distal ulcer measuring 0.3x0.3 post-debridement   No images are attached to the encounter.  Radiographs: X-ray of the right foot: degenerative changes hallux IPJ Assessment:   1. Hallux malleus of right foot   2. Diabetic ulcer of right great toe [facility])    Plan:  Patient was evaluated and treated and all questions answered.  Hallux Malleus right with ulcer -XR reviewed with patient -Educated on etiology of deformity -Discussed padding and shoe gear changes -Dispensed toe spacers  Procedure: Selective Debridement of Wound Rationale: Removal of devitalized tissue from the wound to promote healing.  Pre-Debridement Wound Measurements: 0.3 cm x 0.3 cm x 0.1 cm  Post-Debridement Wound Measurements: same as pre-debridement. Type of Debridement: sharp selective Tissue Removed: Devitalized soft-tissue Dressing: Dry, sterile, compression dressing. Disposition: Patient tolerated procedure well. Patient to return in 1 week for follow-up.   Return in about 6 weeks (around 09/06/2019) for Hallux malleus with ulcer f/u .

## 2019-08-01 ENCOUNTER — Other Ambulatory Visit: Payer: Self-pay

## 2019-08-01 ENCOUNTER — Encounter: Payer: Self-pay | Admitting: Pharmacist

## 2019-08-01 ENCOUNTER — Ambulatory Visit (INDEPENDENT_AMBULATORY_CARE_PROVIDER_SITE_OTHER): Payer: Medicare PPO | Admitting: Pharmacist

## 2019-08-01 VITALS — BP 104/80 | HR 101

## 2019-08-01 DIAGNOSIS — Z794 Long term (current) use of insulin: Secondary | ICD-10-CM

## 2019-08-01 DIAGNOSIS — E119 Type 2 diabetes mellitus without complications: Secondary | ICD-10-CM | POA: Diagnosis not present

## 2019-08-01 MED ORDER — TRULICITY 3 MG/0.5ML ~~LOC~~ SOAJ
3.0000 mg | SUBCUTANEOUS | 4 refills | Status: DC
Start: 2019-08-01 — End: 2019-11-06

## 2019-08-01 NOTE — Progress Notes (Signed)
    08/01/2019 Name: Charles Pennington MRN: [id number] DOB: 1965-04-29   S: 63 yoM  presents for diabetes evaluation, education, and management Patient was referred and last seen by Primary Care Provider on 06/29/19.  Insurance coverage/medication affordability: dual [employer]/medicaid  Patient denies adherence with medications.  Current diabetes medications include: Trulicity, metformin, tresiba, farxiga  Current hypertension medications include: lisinopril  Current hyperlipidemia medications include: atorvastatin   Patient denies hypoglycemic events.   Patient reported dietary habits: Eats 2-3 meals/day Reviewed diet and suggestions for patient to consider  Breakfast:  Oatmeal cookie Lunch:burgers, sandwiches, etc Dinner:chicken, green beans, potatoes and cornbread Snacks: loves potato chips Drinks:mtn dew, coke, water  Patient-reported exercise habits: n/a  Patient reports nocturia (nighttime urination).  Patient reports neuropathy (nerve pain).  Patient denies visual changes.  Patient sees podiatry for foot exams.  Referral to podiatry placed by PCP.  He has saw specialty on 07/26/19.  Toe appears to be healing.  He has f/u with podiatry on 7/22  O:  Lab Results  Component Value Date   HGBA1C 8.2 (H) 06/07/2019    Vitals:   08/01/19 1126  BP: 104/80  Pulse: (!) 101    Lipid Panel     Component Value Date/Time   CHOL 122 06/07/2019 1551   TRIG 132 06/07/2019 1551   HDL 48 06/07/2019 1551   CHOLHDL 2.5 06/07/2019 1551   LDLCALC 51 06/07/2019 1551     Home fasting blood sugars: not checking, needs test strips Contour next EZ  2 hour post-meal/random blood sugars: 1 hour post prandial this AM was 328 (ate apple pie and coke)    A/P:  Diabetes T2DM currently uncontrolled. Patient is able to verbalize appropriate hypoglycemia management plan. Patient is not adherent with medication. Control is suboptimal due to diet/missed medication doses.   BG today was 195 (s/p oatmeal cookie 2 hrs prior)  -Continued dose of basal insulin Tresiba  (insulin degludec) to 60 units daily (new RX called in)  -Increase GLP1 Trulicity 3mg  sq weekly (Monday)  -instructed pt to double up on 1.5mg  injections until he gets 3mg  injections  -RX for Trulicity 3mg  sq weekly called in to [facility]  -Continued SGLT2-I Farxiga (generic name:dapagliflozin) 10mg  daily. Counseled on sick day rules for patient to hold in not eating/vomiting/dehydrated.  -Continue metformin XR once daily  -RX sent to pharmacy for Contour Next EX test strips  -Extensively discussed pathophysiology of diabetes, recommended lifestyle interventions, dietary effects on blood sugar control  -Counseled on s/sx of and management of hypoglycemia  -Next A1C anticipated 3 months (august)     Written patient instructions provided.  Total time in face to face counseling 28 minutes.   Follow up Pharmacist TELEPHONE Clinic Visit in July.    [facility], PharmD, BCPS Clinical Pharmacist, [facility]  II Phone [phone]

## 2019-08-06 ENCOUNTER — Other Ambulatory Visit: Payer: Self-pay | Admitting: Family

## 2019-08-09 ENCOUNTER — Ambulatory Visit (INDEPENDENT_AMBULATORY_CARE_PROVIDER_SITE_OTHER): Payer: Medicare PPO

## 2019-08-09 DIAGNOSIS — Z0000 Encounter for general adult medical examination without abnormal findings: Secondary | ICD-10-CM

## 2019-08-09 NOTE — Progress Notes (Signed)
MEDICARE ANNUAL WELLNESS VISIT  08/09/2019  Telephone Visit Disclaimer This Medicare AWV was conducted by telephone due to national recommendations for restrictions regarding the COVID-19 Pandemic (e.g. social distancing).  I verified, using two identifiers, that I am speaking with [name] or their authorized healthcare agent. I discussed the limitations, risks, security, and privacy concerns of performing an evaluation and management service by telephone and the potential availability of an in-person appointment in the future. The patient expressed understanding and agreed to proceed.   Subjective:  DASHEL GOINES is a 54 y.o. male patient of [name], Theador Hawthorne, FNP who had a Medicare Annual Wellness Visit today via telephone. Heru is Disabled and lives with their family. he has no children. he reports that he is socially active and does interact with friends/family regularly. he is not physically active and enjoys.  Patient Care Team: Sharion Balloon, FNP as PCP - General (Family Medicine) Sharion Balloon, FNP (Nurse Practitioner) Ilean China, RN as Registered Nurse Lavera Guise, [facility] (Pharmacist)  Advanced Directives 08/09/2019 08/07/2018 11/29/2017 07/01/2016 05/12/2016 05/07/2016 04/28/2016  Does Patient Have a Medical Advance Directive? No No No No - No No  Would patient like information on creating a medical advance directive? No - Patient declined Yes (MAU/Ambulatory/Procedural Areas - Information given) Yes (ED - Information included in AVS) No - Patient declined No - Patient declined No - Patient declined No - Patient declined  Some encounter information is confidential and restricted. Go to Review Flowsheets activity to see all data.    Hospital Utilization Over the Past 12 Months: # of hospitalizations or ER visits: 0 # of surgeries: 0  Review of Systems    Patient reports that his overall health is unchanged compared to last year.    Patient  Reported Readings (BP, Pulse, CBG, Weight, etc) none  Pain Assessment Pain : 0-10 Pain Score: 7  Pain Location: Back Pain Orientation: Lower Pain Descriptors / Indicators: Discomfort Pain Onset: More than a month ago Pain Frequency: Constant Pain Relieving Factors: Hydrocodone  Pain Relieving Factors: Hydrocodone  Current Medications & Allergies (verified) Allergies as of 08/09/2019      Reactions   Hydrocodone-acetaminophen Itching, Other (See Comments)   Pt is able to tolerate with Benadryl.    Gadolinium Derivatives Nausea And Vomiting      Medication List       Accurate as of August 09, 2019  2:46 PM. If you have any questions, ask your nurse or doctor.        albuterol (2.5 MG/3ML) 0.083% nebulizer solution Commonly known as: PROVENTIL NEBULIZE 1 VIAL EVERY 6 HOURS AS NEEDED FOR WHEEZING OR SHORTNESS OF BREATH   albuterol 108 (90 Base) MCG/ACT inhaler Commonly known as: VENTOLIN HFA INHALE 2 PUFFS INTO THE LUNGS EVERY 6 (SIX) HOURS AS NEEDED FOR WHEEZING.   aspirin EC 81 MG tablet Take 1 tablet (81 mg total) by mouth daily.   atorvastatin 80 MG tablet Commonly known as: LIPITOR Take 1 tablet (80 mg total) by mouth daily. (Needs to be seen before next refill)   Belbuca 450 MCG Film Generic drug: Buprenorphine HCl   blood glucose meter kit and supplies Dispense based on patient and insurance preference. Test sugar BID and as needed   Contour Next Test test strip Generic drug: glucose blood Check blood sugar twice daily Dx E11.9   Farxiga 10 MG Tabs tablet Generic drug: dapagliflozin propanediol Take 10 mg by mouth daily.  HYDROcodone-acetaminophen 5-325 MG tablet Commonly known as: NORCO/VICODIN   ibuprofen 800 MG tablet Commonly known as: ADVIL   levothyroxine 175 MCG tablet Commonly known as: SYNTHROID TAKE (1) TABLET DAILY BE- FORE BREAKFAST.   lisinopril 20 MG tablet Commonly known as: ZESTRIL Take 1 tablet (20 mg total) by mouth daily.     metFORMIN 750 MG 24 hr tablet Commonly known as: GLUCOPHAGE-XR TAKE 2 TABLET WITH MORNING MEAL   omeprazole 20 MG capsule Commonly known as: PRILOSEC TAKE (1) CAPSULE DAILY   PARoxetine 40 MG tablet Commonly known as: PAXIL Take 1 tablet (40 mg total) by mouth daily.   pregabalin 150 MG capsule Commonly known as: LYRICA TAKE 2 CAPSULES EVERY MORNING AND 2 CAPSULES EVERY EVENING   Trelegy Ellipta 100-62.5-25 MCG/INH Aepb Generic drug: Fluticasone-Umeclidin-Vilant Inhale 1 puff into the lungs daily.   Tyler Aas FlexTouch 200 UNIT/ML FlexTouch Pen Generic drug: insulin degludec Inject 60 Units into the skin daily.   Trulicity 3 PN/3.6RW Sopn Generic drug: Dulaglutide Inject 0.5 mLs (3 mg total) as directed once a week.   UltiCare Micro Pen Needles 32G X 4 MM Misc Generic drug: Insulin Pen Needle Use 2 times a day To test blood glucose   zolpidem 10 MG tablet Commonly known as: AMBIEN Take 1 tablet (10 mg total) by mouth at bedtime.       History (reviewed): Past Medical History:  Diagnosis Date  . Anxiety   . Asthma   . Diabetes mellitus   . Diabetes mellitus without complication ([place])   . Hypercholesteremia   . Hypertension   . Hypothyroidism   . Low back pain   . Obesity   . SOB (shortness of breath)   . Stroke [facility]) age 33  . Stroke ([place])   . Vertigo    Past Surgical History:  Procedure Laterality Date  . BACK SURGERY  2013  . COLONOSCOPY  2009   Inflammatory changes of the cecum and ascending colon most consistent with infectious etiology, NSAID, ischemia. Suspected resolving infection based on symptomatology.  Marland Kitchen FOOT SURGERY     Dr Irving Shows  . SHOULDER ARTHROSCOPY Right   . SPINE SURGERY    . WRIST SURGERY     Family History  Problem Relation Age of Onset  . Hypertension Father   . Suicidality Father   . Diabetes Father   . Diabetes Mother   . Colon cancer Neg Hx   . Inflammatory bowel disease Neg Hx    Social History   Socioeconomic  History  . Marital status: Single    Spouse name: Not on file  . Number of children: 2  . Years of education: Not on file  . Highest education level: 8th grade  Occupational History  . Occupation: disabled Clinical biochemist  Tobacco Use  . Smoking status: Current Every Day Smoker    Packs/day: 0.50    Years: 30.00    Pack years: 15.00    Types: Cigarettes  . Smokeless tobacco: Never Used  Vaping Use  . Vaping Use: Never used  Substance and Sexual Activity  . Alcohol use: No    Comment: occasional per pt  . Drug use: No  . Sexual activity: Yes  Other Topics Concern  . Not on file  Social History Narrative   ** Merged History Encounter **       Social Determinants of Health   Financial Resource Strain:   . Difficulty of Paying Living Expenses:   Food Insecurity:   . Worried About  Running Out of Food in the Last Year:   . [place] in the Last Year:   Transportation Needs:   . Lack of Transportation (Medical):   Marland Kitchen Lack of Transportation (Non-Medical):   Physical Activity:   . Days of Exercise per Week:   . Minutes of Exercise per Session:   Stress:   . Feeling of Stress :   Social Connections:   . Frequency of Communication with Friends and Family:   . Frequency of Social Gatherings with Friends and Family:   . Attends Religious Services:   . Active Member of Clubs or Organizations:   . Attends Archivist Meetings:   Marland Kitchen Marital Status:     Activities of Daily Living In your present state of health, do you have any difficulty performing the following activities: 08/09/2019  Hearing? N  Vision? N  Difficulty concentrating or making decisions? N  Walking or climbing stairs? Y  Dressing or bathing? N  Doing errands, shopping? N  Preparing Food and eating ? N  Using the Toilet? N  In the past six months, have you accidently leaked urine? N  Do you have problems with loss of bowel control? N  Managing your Medications? N  Managing your Finances? N   Housekeeping or managing your Housekeeping? N  Some recent data might be hidden    Patient Education/ Literacy How often do you need to have someone help you when you read instructions, pamphlets, or other written materials from your doctor or pharmacy?: 2 - Rarely What is the last grade level you completed in school?: 8th grade  Exercise Current Exercise Habits: The patient does not participate in regular exercise at present, Exercise limited by: orthopedic condition(s);neurologic condition(s)  Diet Patient reports consuming 3 meals a day and 2 snack(s) a day Patient reports that his primary diet is: Diabetic Patient reports that she does have regular access to food.   Depression Screen PHQ 2/9 Scores 08/09/2019 06/07/2019 05/29/2019 03/15/2019 01/10/2019 08/07/2018 06/30/2018  PHQ - 2 Score 0 0 0 0 0 0 0  PHQ- 9 Score - 0 - - - - -     Fall Risk Fall Risk  08/09/2019 05/29/2019 03/15/2019 08/07/2018 09/01/2017  Falls in the past year? 1 0 0 1 No  Number falls in past yr: 1 - - 0 -  Injury with Fall? 0 - - 0 -  Comment - - - - -  Risk Factor Category  - - - - -  Risk for fall due to : Impaired balance/gait - - Impaired balance/gait -  Follow up Falls evaluation completed - - - -     Objective:  [name] seemed alert and oriented and he participated appropriately during our telephone visit.  Blood Pressure Weight BMI  BP Readings from Last 3 Encounters:  08/01/19 104/80  06/29/19 117/83  06/07/19 (!) 134/92   Wt Readings from Last 3 Encounters:  06/29/19 228 lb 3.2 oz (103.5 kg)  06/07/19 234 lb 12.8 oz (106.5 kg)  05/29/19 225 lb (102.1 kg)   BMI Readings from Last 1 Encounters:  06/29/19 34.70 kg/m    *Unable to obtain current vital signs, weight, and BMI due to telephone visit type  Hearing/Vision  . Happy did not seem to have difficulty with hearing/understanding during the telephone conversation . Reports that he has not had a formal eye exam by an eye  care professional within the past year . Reports that he has not  had a formal hearing evaluation within the past year *Unable to fully assess hearing and vision during telephone visit type  Cognitive Function: 6CIT Screen 08/09/2019 08/07/2018  What Year? 4 points 0 points  What month? 0 points 0 points  What time? 0 points 0 points  Count back from 20 0 points 0 points  Months in reverse 4 points 4 points  Repeat phrase 6 points 0 points  Total Score 14 4   (Normal:0-7, Significant for Dysfunction: >8)  Normal Cognitive Function Screening: No: 14. He stated the year was 2002, He could not say the street address and could not state the months of the year backwards.   Immunization & Health Maintenance Record Immunization History  Administered Date(s) Administered  . Influenza,inj,Quad PF,6+ Mos 03/15/2014, 01/26/2018  . Influenza-Unspecified 03/23/2017  . Pneumococcal Conjugate-13 12/05/2014  . Tdap 04/04/2016    Health Maintenance  Topic Date Due  . Hepatitis C Screening  Never done  . PNEUMOCOCCAL POLYSACCHARIDE VACCINE AGE 63-64 HIGH RISK  Never done  . COVID-19 Vaccine (1) Never done  . COLONOSCOPY  Never done  . OPHTHALMOLOGY EXAM  05/02/2019  . INFLUENZA VACCINE  09/16/2019  . HEMOGLOBIN A1C  12/07/2019  . FOOT EXAM  06/28/2020  . TETANUS/TDAP  04/04/2026  . HIV Screening  Completed       Assessment  This is a routine wellness examination for ANGIE HOGG.  Health Maintenance: Due or Overdue Health Maintenance Due  Topic Date Due  . Hepatitis C Screening  Never done  . PNEUMOCOCCAL POLYSACCHARIDE VACCINE AGE 63-64 HIGH RISK  Never done  . COVID-19 Vaccine (1) Never done  . COLONOSCOPY  Never done  . OPHTHALMOLOGY EXAM  05/02/2019    [name] does not need a referral for Community Assistance: Care Management:   no Social Work:    no Prescription Assistance:  no Nutrition/Diabetes Education:  no   Plan:  Personalized Goals Goals Addressed             This Visit's Progress   . Exercise 3x per week (30 min per time)   Not on track    Try to exercise for at least 30 minutes, 3 times weekly;    . Obtain Annual Eye (retinal)  Exam         Personalized Health Maintenance & Screening Recommendations   Schedule annual eye exam  Lung Cancer Screening Recommended: no (Low Dose CT Chest recommended if Age 55-80 years, 30 pack-year currently smoking OR have quit w/in past 15 years) Hepatitis C Screening recommended: no HIV Screening recommended: no  Advanced Directives: Written information was not prepared per patient's request.  Referrals & Orders No orders of the defined types were placed in this encounter.   Follow-up Plan . Follow-up with Sharion Balloon, FNP as planned . Schedule 10/02/2019    I have personally reviewed and noted the following in the patient's chart:   . Medical and social history . Use of alcohol, tobacco or illicit drugs  . Current medications and supplements . Functional ability and status . Nutritional status . Physical activity . Advanced directives . List of other physicians . Hospitalizations, surgeries, and ER visits in previous 12 months . Vitals . Screenings to include cognitive, depression, and falls . Referrals and appointments  In addition, I have reviewed and discussed with [name] certain preventive protocols, quality metrics, and best practice recommendations. A written personalized care plan for preventive services as well as general preventive  health recommendations is available and can be mailed to the patient at his request.      Maud Deed [facility]  6/94/0982

## 2019-08-09 NOTE — Patient Instructions (Signed)
  MEDICARE ANNUAL WELLNESS VISIT Health Maintenance Summary and Written Plan of Care  [name] ,  Thank you for allowing me to perform your Medicare Annual Wellness Visit and for your ongoing commitment to your health.   Health Maintenance & Immunization History Health Maintenance  Topic Date Due  . Hepatitis C Screening  Never done  . PNEUMOCOCCAL POLYSACCHARIDE VACCINE AGE 53-64 HIGH RISK  Never done  . COVID-19 Vaccine (1) Never done  . COLONOSCOPY  Never done  . OPHTHALMOLOGY EXAM  05/02/2019  . INFLUENZA VACCINE  09/16/2019  . HEMOGLOBIN A1C  12/07/2019  . FOOT EXAM  06/28/2020  . TETANUS/TDAP  04/04/2026  . HIV Screening  Completed   Immunization History  Administered Date(s) Administered  . Influenza,inj,Quad PF,6+ Mos 03/15/2014, 01/26/2018  . Influenza-Unspecified 03/23/2017  . Pneumococcal Conjugate-13 12/05/2014  . Tdap 04/04/2016    These are the patient goals that we discussed: Goals Addressed            This Visit's Progress   . Exercise 3x per week (30 min per time)   Not on track    Try to exercise for at least 30 minutes, 3 times weekly;    . Obtain Annual Eye (retinal)  Exam           This is a list of Health Maintenance Items that are overdue or due now: Health Maintenance Due  Topic Date Due  . Hepatitis C Screening  Never done  . PNEUMOCOCCAL POLYSACCHARIDE VACCINE AGE 53-64 HIGH RISK  Never done  . COVID-19 Vaccine (1) Never done  . COLONOSCOPY  Never done  . OPHTHALMOLOGY EXAM  05/02/2019     Orders/Referrals Placed Today: No orders of the defined types were placed in this encounter.  (Contact our referral department at [phone] if you have not spoken with someone about your referral appointment within the next 5 days)    Follow-up Plan  Scheduled with Charles Rodney, FNP 10/02/2019 at 9:10am  Don't forget to schedule an eye exam.

## 2019-08-15 DIAGNOSIS — Z23 Encounter for immunization: Secondary | ICD-10-CM | POA: Diagnosis not present

## 2019-08-24 DIAGNOSIS — G8929 Other chronic pain: Secondary | ICD-10-CM | POA: Diagnosis not present

## 2019-08-24 DIAGNOSIS — G894 Chronic pain syndrome: Secondary | ICD-10-CM | POA: Diagnosis not present

## 2019-08-24 DIAGNOSIS — M545 Low back pain: Secondary | ICD-10-CM | POA: Diagnosis not present

## 2019-08-24 DIAGNOSIS — Z79899 Other long term (current) drug therapy: Secondary | ICD-10-CM | POA: Diagnosis not present

## 2019-08-25 ENCOUNTER — Other Ambulatory Visit: Payer: Self-pay | Admitting: Family

## 2019-08-27 ENCOUNTER — Other Ambulatory Visit: Payer: Self-pay | Admitting: Family

## 2019-08-29 ENCOUNTER — Ambulatory Visit: Payer: Self-pay | Admitting: Pharmacist

## 2019-09-03 ENCOUNTER — Ambulatory Visit: Payer: Medicare PPO | Admitting: Pharmacist

## 2019-09-03 ENCOUNTER — Telehealth: Payer: Self-pay | Admitting: Pharmacist

## 2019-09-03 NOTE — Telephone Encounter (Signed)
Call placed to patient to follow up on diabetes No answer with no VM set up Will attempt again in 1-3 business days

## 2019-09-05 ENCOUNTER — Other Ambulatory Visit: Payer: Self-pay | Admitting: Family

## 2019-09-06 ENCOUNTER — Other Ambulatory Visit: Payer: Self-pay | Admitting: Family

## 2019-09-06 ENCOUNTER — Other Ambulatory Visit: Payer: Self-pay | Admitting: Family Medicine

## 2019-09-06 ENCOUNTER — Ambulatory Visit (INDEPENDENT_AMBULATORY_CARE_PROVIDER_SITE_OTHER): Payer: Medicare PPO | Admitting: Podiatry

## 2019-09-06 DIAGNOSIS — J441 Chronic obstructive pulmonary disease with (acute) exacerbation: Secondary | ICD-10-CM

## 2019-09-06 DIAGNOSIS — Z5329 Procedure and treatment not carried out because of patient's decision for other reasons: Secondary | ICD-10-CM

## 2019-09-06 DIAGNOSIS — E1142 Type 2 diabetes mellitus with diabetic polyneuropathy: Secondary | ICD-10-CM

## 2019-09-06 DIAGNOSIS — E119 Type 2 diabetes mellitus without complications: Secondary | ICD-10-CM

## 2019-09-06 NOTE — Progress Notes (Signed)
No show for appt. 

## 2019-09-12 DIAGNOSIS — Z23 Encounter for immunization: Secondary | ICD-10-CM | POA: Diagnosis not present

## 2019-09-13 ENCOUNTER — Ambulatory Visit (INDEPENDENT_AMBULATORY_CARE_PROVIDER_SITE_OTHER): Payer: Medicare PPO | Admitting: Podiatry

## 2019-09-13 ENCOUNTER — Other Ambulatory Visit: Payer: Self-pay

## 2019-09-13 DIAGNOSIS — M7751 Other enthesopathy of right foot: Secondary | ICD-10-CM

## 2019-09-13 DIAGNOSIS — E11621 Type 2 diabetes mellitus with foot ulcer: Secondary | ICD-10-CM | POA: Diagnosis not present

## 2019-09-13 DIAGNOSIS — M2031 Hallux varus (acquired), right foot: Secondary | ICD-10-CM

## 2019-09-13 DIAGNOSIS — L97519 Non-pressure chronic ulcer of other part of right foot with unspecified severity: Secondary | ICD-10-CM

## 2019-09-13 DIAGNOSIS — T24102A Burn of first degree of unspecified site of left lower limb, except ankle and foot, initial encounter: Secondary | ICD-10-CM | POA: Diagnosis not present
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
## 2019-09-24 ENCOUNTER — Other Ambulatory Visit: Payer: Self-pay | Admitting: Family

## 2019-09-24 DIAGNOSIS — M545 Low back pain: Secondary | ICD-10-CM | POA: Diagnosis not present

## 2019-09-24 DIAGNOSIS — G894 Chronic pain syndrome: Secondary | ICD-10-CM | POA: Diagnosis not present

## 2019-09-24 DIAGNOSIS — G8929 Other chronic pain: Secondary | ICD-10-CM | POA: Diagnosis not present

## 2019-09-24 DIAGNOSIS — Z79899 Other long term (current) drug therapy: Secondary | ICD-10-CM | POA: Diagnosis not present

## 2019-09-25 ENCOUNTER — Ambulatory Visit (INDEPENDENT_AMBULATORY_CARE_PROVIDER_SITE_OTHER): Payer: Medicare PPO | Admitting: Podiatry

## 2019-09-25 DIAGNOSIS — Z5329 Procedure and treatment not carried out because of patient's decision for other reasons: Secondary | ICD-10-CM

## 2019-09-25 NOTE — Progress Notes (Signed)
No show for appt. 

## 2019-09-27 DIAGNOSIS — E119 Type 2 diabetes mellitus without complications: Secondary | ICD-10-CM | POA: Diagnosis not present

## 2019-09-27 DIAGNOSIS — E039 Hypothyroidism, unspecified: Secondary | ICD-10-CM | POA: Diagnosis not present

## 2019-09-27 DIAGNOSIS — M25551 Pain in right hip: Secondary | ICD-10-CM | POA: Diagnosis not present

## 2019-09-27 DIAGNOSIS — G629 Polyneuropathy, unspecified: Secondary | ICD-10-CM | POA: Diagnosis not present

## 2019-09-27 DIAGNOSIS — Z8673 Personal history of transient ischemic attack (TIA), and cerebral infarction without residual deficits: Secondary | ICD-10-CM | POA: Diagnosis not present

## 2019-09-27 DIAGNOSIS — E785 Hyperlipidemia, unspecified: Secondary | ICD-10-CM | POA: Diagnosis not present

## 2019-09-27 DIAGNOSIS — Y939 Activity, unspecified: Secondary | ICD-10-CM | POA: Diagnosis not present

## 2019-09-27 DIAGNOSIS — X58XXXA Exposure to other specified factors, initial encounter: Secondary | ICD-10-CM | POA: Diagnosis not present

## 2019-09-27 DIAGNOSIS — S76011A Strain of muscle, fascia and tendon of right hip, initial encounter: Secondary | ICD-10-CM | POA: Diagnosis not present

## 2019-10-02 ENCOUNTER — Ambulatory Visit: Payer: Medicare PPO | Admitting: Family

## 2019-10-03 ENCOUNTER — Other Ambulatory Visit: Payer: Self-pay | Admitting: Family

## 2019-10-04 ENCOUNTER — Other Ambulatory Visit: Payer: Self-pay | Admitting: Family

## 2019-10-05 ENCOUNTER — Ambulatory Visit (INDEPENDENT_AMBULATORY_CARE_PROVIDER_SITE_OTHER): Payer: Medicare PPO | Admitting: Family

## 2019-10-05 ENCOUNTER — Other Ambulatory Visit: Payer: Self-pay

## 2019-10-05 ENCOUNTER — Encounter: Payer: Self-pay | Admitting: Family

## 2019-10-05 VITALS — BP 131/86 | HR 98 | Temp 96.1°F | Ht 68.0 in | Wt 240.2 lb

## 2019-10-05 DIAGNOSIS — E1142 Type 2 diabetes mellitus with diabetic polyneuropathy: Secondary | ICD-10-CM | POA: Diagnosis not present

## 2019-10-05 DIAGNOSIS — Z8673 Personal history of transient ischemic attack (TIA), and cerebral infarction without residual deficits: Secondary | ICD-10-CM

## 2019-10-05 DIAGNOSIS — E1169 Type 2 diabetes mellitus with other specified complication: Secondary | ICD-10-CM

## 2019-10-05 DIAGNOSIS — E039 Hypothyroidism, unspecified: Secondary | ICD-10-CM | POA: Diagnosis not present

## 2019-10-05 DIAGNOSIS — F17200 Nicotine dependence, unspecified, uncomplicated: Secondary | ICD-10-CM

## 2019-10-05 DIAGNOSIS — F331 Major depressive disorder, recurrent, moderate: Secondary | ICD-10-CM

## 2019-10-05 DIAGNOSIS — E1159 Type 2 diabetes mellitus with other circulatory complications: Secondary | ICD-10-CM | POA: Diagnosis not present

## 2019-10-05 DIAGNOSIS — Z23 Encounter for immunization: Secondary | ICD-10-CM | POA: Diagnosis not present

## 2019-10-05 DIAGNOSIS — I10 Essential (primary) hypertension: Secondary | ICD-10-CM | POA: Diagnosis not present

## 2019-10-05 DIAGNOSIS — E119 Type 2 diabetes mellitus without complications: Secondary | ICD-10-CM | POA: Diagnosis not present

## 2019-10-05 DIAGNOSIS — M5440 Lumbago with sciatica, unspecified side: Secondary | ICD-10-CM

## 2019-10-05 DIAGNOSIS — Z794 Long term (current) use of insulin: Secondary | ICD-10-CM

## 2019-10-05 DIAGNOSIS — G8929 Other chronic pain: Secondary | ICD-10-CM

## 2019-10-05 DIAGNOSIS — Z1159 Encounter for screening for other viral diseases: Secondary | ICD-10-CM

## 2019-10-05 DIAGNOSIS — Z9114 Patient's other noncompliance with medication regimen: Secondary | ICD-10-CM

## 2019-10-05 DIAGNOSIS — E785 Hyperlipidemia, unspecified: Secondary | ICD-10-CM

## 2019-10-05 DIAGNOSIS — K219 Gastro-esophageal reflux disease without esophagitis: Secondary | ICD-10-CM | POA: Diagnosis not present

## 2019-10-05 DIAGNOSIS — Z1211 Encounter for screening for malignant neoplasm of colon: Secondary | ICD-10-CM

## 2019-10-05 DIAGNOSIS — F411 Generalized anxiety disorder: Secondary | ICD-10-CM | POA: Diagnosis not present

## 2019-10-05 DIAGNOSIS — G4700 Insomnia, unspecified: Secondary | ICD-10-CM

## 2019-10-05 LAB — BAYER DCA HB A1C WAIVED: HB A1C (BAYER DCA - WAIVED): 7.4 % — ABNORMAL HIGH (ref <7.0)

## 2019-10-05 MED ORDER — PREGABALIN 150 MG PO CAPS: ORAL_CAPSULE | ORAL | 2 refills | Status: DC

## 2019-10-05 MED ORDER — ZOLPIDEM TARTRATE 10 MG PO TABS: 10.0000 mg | ORAL_TABLET | Freq: Every day | ORAL | 5 refills | Status: DC

## 2019-10-05 NOTE — Patient Instructions (Signed)

## 2019-10-05 NOTE — Progress Notes (Signed)
Subjective:    Patient ID: Charles Pennington, male    DOB: February 25, 1965, 54 y.o.   MRN: [id number]  Chief Complaint  Patient presents with  . Medical Management of Chronic Issues  . Diabetes  . Hypertension   Pt presents to the office today for chronic follow up. He is a poor historian.   Pt had a CVA when he turned 31 years. PT states he continue to have "balance issues related to the CVA". PT states this is stable.   Pt is noncompliant with his medicationsand takes "most of his pills most days".   He is followed by [facility] every month for chronic back pain and diabetic neuropathy. Diabetes He presents for his follow-up diabetic visit. He has type 2 diabetes mellitus. His disease course has been worsening. Hypoglycemia symptoms include nervousness/anxiousness. Associated symptoms include blurred vision, fatigue and foot paresthesias. Symptoms are stable. Diabetic complications include a CVA, heart disease, nephropathy and peripheral neuropathy. Risk factors for coronary artery disease include male sex, hypertension, sedentary lifestyle, tobacco exposure, family history, dyslipidemia and diabetes mellitus. (Does not check BS at home )  Hypertension This is a chronic problem. The current episode started more than 1 year ago. The problem has been resolved since onset. The problem is controlled. Associated symptoms include blurred vision, malaise/fatigue and shortness of breath. Pertinent negatives include no peripheral edema. Risk factors for coronary artery disease include obesity, male gender, sedentary lifestyle, dyslipidemia and diabetes mellitus. The current treatment provides moderate improvement. Hypertensive end-organ damage includes CVA. Identifiable causes of hypertension include a thyroid problem.  Gastroesophageal Reflux He complains of belching, heartburn and a hoarse voice. This is a chronic problem. The current episode started more than 1 year ago. The problem occurs  occasionally. Associated symptoms include fatigue. He has tried a PPI for the symptoms. The treatment provided moderate relief.  Hyperlipidemia This is a chronic problem. The current episode started more than 1 year ago. The problem is controlled. Recent lipid tests were reviewed and are normal. Exacerbating diseases include obesity. Associated symptoms include shortness of breath. Current antihyperlipidemic treatment includes statins. The current treatment provides moderate improvement of lipids. Risk factors for coronary artery disease include a sedentary lifestyle, male sex, hypertension, dyslipidemia and diabetes mellitus.  Nicotine Dependence Presents for follow-up visit. Symptoms include fatigue. His urge triggers include company of smokers. He smokes < 1/2 a pack of cigarettes per day. Compliance with prior treatments has been good.  Thyroid Problem Presents for follow-up visit. Symptoms include anxiety, depressed mood, fatigue and hoarse voice. The symptoms have been stable. His past medical history is significant for hyperlipidemia.  COPD PT continues to smoke 1/2 pack a day. Having SOB when walking.   Review of Systems  Constitutional: Positive for fatigue and malaise/fatigue.  HENT: Positive for hoarse voice.   Eyes: Positive for blurred vision.  Respiratory: Positive for shortness of breath.   Gastrointestinal: Positive for heartburn.  Psychiatric/Behavioral: The patient is nervous/anxious.   All other systems reviewed and are negative.      Objective:   Physical Exam Vitals reviewed.  Constitutional:      General: He is not in acute distress.    Appearance: He is well-developed.  HENT:     Head: Normocephalic.     Right Ear: Tympanic membrane normal.     Left Ear: Tympanic membrane normal.  Eyes:     General:        Right eye: No discharge.  Left eye: No discharge.     Pupils: Pupils are equal, round, and reactive to light.  Neck:     Thyroid: No thyromegaly.    Cardiovascular:     Rate and Rhythm: Normal rate and regular rhythm.     Heart sounds: Normal heart sounds. No murmur heard.   Pulmonary:     Effort: Pulmonary effort is normal. No respiratory distress.     Breath sounds: Normal breath sounds. No wheezing.  Abdominal:     General: Bowel sounds are normal. There is no distension.     Palpations: Abdomen is soft.     Tenderness: There is no abdominal tenderness.  Musculoskeletal:        General: No tenderness. Normal range of motion.     Cervical back: Normal range of motion and neck supple.     Comments: Pain in lumbar with flexion and extension  Skin:    General: Skin is warm and dry.     Findings: No erythema or rash.  Neurological:     Mental Status: He is alert and oriented to person, place, and time.     Cranial Nerves: No cranial nerve deficit.     Deep Tendon Reflexes: Reflexes are normal and symmetric.  Psychiatric:        Behavior: Behavior normal.        Thought Content: Thought content normal.        Judgment: Judgment normal.          BP 131/86   Pulse 98   Temp (!) 96.1 F (35.6 C) (Temporal)   Ht _0  (1.727 m)   Wt 240 lb 3.2 oz (109 kg)   SpO2 97%   BMI 36.52 kg/m   Assessment & Plan:  Charles Pennington comes in today with chief complaint of Medical Management of Chronic Issues, Diabetes, and Hypertension   Diagnosis and orders addressed:  1. Hypertension associated with diabetes ([place]) - CMP14+EGFR - CBC with Differential/Platelet  2. Gastroesophageal reflux disease, unspecified whether esophagitis present - CMP14+EGFR - CBC with Differential/Platelet  3. Type 2 diabetes mellitus treated with insulin (HCC) - CMP14+EGFR - CBC with Differential/Platelet - Bayer DCA Hb A1c Waived  4. Hypothyroidism, unspecified type - CMP14+EGFR - CBC with Differential/Platelet - TSH  5. Hyperlipidemia associated with type 2 diabetes mellitus (HCC) - CMP14+EGFR - CBC with Differential/Platelet  6.  Diabetic polyneuropathy associated with type 2 diabetes mellitus (HCC) - CMP14+EGFR - CBC with Differential/Platelet  7. Chronic low back pain with sciatica, sciatica laterality unspecified, unspecified back pain laterality - CMP14+EGFR - CBC with Differential/Platelet  8. GAD (generalized anxiety disorder)  - CMP14+EGFR - CBC with Differential/Platelet  9. Moderate episode of recurrent major depressive disorder (HCC) - CMP14+EGFR - CBC with Differential/Platelet  10. History of CVA (cerebrovascular accident) - CMP14+EGFR - CBC with Differential/Platelet  11. Insomnia, unspecified type  - CMP14+EGFR - CBC with Differential/Platelet  12. Morbid obesity ([place])  - CMP14+EGFR - CBC with Differential/Platelet  13. Noncompliance with medications  - CMP14+EGFR - CBC with Differential/Platelet  14. SMOKER  - CMP14+EGFR - CBC with Differential/Platelet  15. Colon cancer screening  - CMP14+EGFR - CBC with Differential/Platelet - Cologuard  16. Need for hepatitis C screening test - CMP14+EGFR - CBC with Differential/Platelet - Hepatitis C antibody   Labs pending Health Maintenance reviewed Diet and exercise encouraged  Follow up plan: 3 months   Evelina Dun, FNP

## 2019-10-06 LAB — CBC WITH DIFFERENTIAL/PLATELET
Basophils Absolute: 0.1 10*3/uL (ref 0.0–0.2)
Basos: 1 %
EOS (ABSOLUTE): 0.9 10*3/uL — ABNORMAL HIGH (ref 0.0–0.4)
Eos: 9 %
Hematocrit: 50.3 % (ref 37.5–51.0)
Hemoglobin: 17 g/dL (ref 13.0–17.7)
Immature Grans (Abs): 0 10*3/uL (ref 0.0–0.1)
Immature Granulocytes: 0 %
Lymphocytes Absolute: 3.9 10*3/uL — ABNORMAL HIGH (ref 0.7–3.1)
Lymphs: 38 %
MCH: 31.1 pg (ref 26.6–33.0)
MCHC: 33.8 g/dL (ref 31.5–35.7)
MCV: 92 fL (ref 79–97)
Monocytes Absolute: 0.7 10*3/uL (ref 0.1–0.9)
Monocytes: 7 %
Neutrophils Absolute: 4.7 10*3/uL (ref 1.4–7.0)
Neutrophils: 45 %
Platelets: 243 10*3/uL (ref 150–450)
RBC: 5.46 x10E6/uL (ref 4.14–5.80)
RDW: 13.1 % (ref 11.6–15.4)
WBC: 10.4 10*3/uL (ref 3.4–10.8)

## 2019-10-06 LAB — CMP14+EGFR
ALT: 17 IU/L (ref 0–44)
AST: 23 IU/L (ref 0–40)
Albumin/Globulin Ratio: 1.8 (ref 1.2–2.2)
Albumin: 4.6 g/dL (ref 3.8–4.9)
Alkaline Phosphatase: 52 IU/L (ref 48–121)
BUN/Creatinine Ratio: 21 — ABNORMAL HIGH (ref 9–20)
BUN: 23 mg/dL (ref 6–24)
Bilirubin Total: 0.3 mg/dL (ref 0.0–1.2)
CO2: 27 mmol/L (ref 20–29)
Calcium: 10.1 mg/dL (ref 8.7–10.2)
Chloride: 101 mmol/L (ref 96–106)
Creatinine, Ser: 1.09 mg/dL (ref 0.76–1.27)
GFR calc Af Amer: 89 mL/min/{1.73_m2} (ref >59)
GFR calc non Af Amer: 77 mL/min/{1.73_m2} (ref >59)
Globulin, Total: 2.5 g/dL (ref 1.5–4.5)
Glucose: 98 mg/dL (ref 65–99)
Potassium: 4.1 mmol/L (ref 3.5–5.2)
Sodium: 141 mmol/L (ref 134–144)
Total Protein: 7.1 g/dL (ref 6.0–8.5)

## 2019-10-06 LAB — HEPATITIS C ANTIBODY: Hep C Virus Ab: <0.1 s/co ratio (ref 0.0–0.9)

## 2019-10-08 ENCOUNTER — Telehealth: Payer: Self-pay | Admitting: Family

## 2019-10-08 NOTE — Telephone Encounter (Signed)
FYI

## 2019-10-08 NOTE — Telephone Encounter (Signed)
Patient complains of, chills, body aches, denies fever. Since vaccine. Advised patient to treat symptoms if patient to get worse call us back.

## 2019-10-15 ENCOUNTER — Telehealth: Payer: Self-pay | Admitting: Family

## 2019-10-15 DIAGNOSIS — G8929 Other chronic pain: Secondary | ICD-10-CM

## 2019-10-15 NOTE — Telephone Encounter (Signed)
Pt wants Neysa Bonito to refer him for PT right next door. Pt said he talked to [place] on 10/05/19 at his appt about his sciatic nerve issues so she is aware.

## 2019-10-15 NOTE — Telephone Encounter (Signed)
Referral to PT placed

## 2019-10-16 ENCOUNTER — Ambulatory Visit (INDEPENDENT_AMBULATORY_CARE_PROVIDER_SITE_OTHER): Payer: Medicare PPO | Admitting: Family Medicine

## 2019-10-16 ENCOUNTER — Other Ambulatory Visit: Payer: Self-pay

## 2019-10-16 ENCOUNTER — Encounter: Payer: Self-pay | Admitting: Family Medicine

## 2019-10-16 VITALS — BP 108/72 | HR 95 | Temp 97.7°F | Ht 68.0 in | Wt 242.4 lb

## 2019-10-16 DIAGNOSIS — G8929 Other chronic pain: Secondary | ICD-10-CM

## 2019-10-16 DIAGNOSIS — M5440 Lumbago with sciatica, unspecified side: Secondary | ICD-10-CM

## 2019-10-16 DIAGNOSIS — L732 Hidradenitis suppurativa: Secondary | ICD-10-CM

## 2019-10-16 MED ORDER — KETOROLAC TROMETHAMINE 30 MG/ML IJ SOLN
30.0000 mg | Freq: Once | INTRAMUSCULAR | Status: AC
  Administered 2019-10-16: 30 mg via INTRAMUSCULAR

## 2019-10-16 MED ORDER — DOXYCYCLINE HYCLATE 100 MG PO TABS: 100.0000 mg | ORAL_TABLET | Freq: Two times a day (BID) | ORAL | 0 refills | Status: DC

## 2019-10-16 NOTE — Patient Instructions (Signed)
USE A WARM COMPRESS UNDER THE RIGHT ARM 3 TIMES DAILY to get it to drain. If no improvement with warm rag and antibiotics, follow up with Christy  Hidradenitis Suppurativa Hidradenitis suppurativa is a long-term (chronic) skin disease. It is similar to a severe form of acne, but it affects areas of the body where acne would be unusual, especially areas of the body where skin rubs against skin and becomes moist. These include:  Underarms.  Groin.  Genital area.  Buttocks.  Upper thighs.  Breasts. Hidradenitis suppurativa may start out as small lumps or pimples caused by blocked sweat glands or hair follicles. Pimples may develop into deep sores that break open (rupture) and drain pus. Over time, affected areas of skin may thicken and become scarred. This condition is rare and does not spread from person to person (non-contagious). What are the causes? The exact cause of this condition is not known. It may be related to:  Male and male hormones.  An overactive disease-fighting system (immune system). The immune system may over-react to blocked hair follicles or sweat glands and cause swelling and pus-filled sores. What increases the risk? You are more likely to develop this condition if you:  Are male.  Are 49-52 years old.  Have a family history of hidradenitis suppurativa.  Have a personal history of acne.  Are overweight.  Smoke.  Take the medicine lithium. What are the signs or symptoms? The first symptoms are usually painful bumps in the skin, similar to pimples. The condition may get worse over time (progress), or it may only cause mild symptoms. If the disease progresses, symptoms may include:  Skin bumps getting bigger and growing deeper into the skin.  Bumps rupturing and draining pus.  Itchy, infected skin.  Skin getting thicker and scarred.  Tunnels under the skin (fistulas) where pus drains from a bump.  Pain during daily activities, such as pain  during walking if your groin area is affected.  Emotional problems, such as stress or depression. This condition may affect your appearance and your ability or willingness to wear certain clothes or do certain activities. How is this diagnosed? This condition is diagnosed by a health care provider who specializes in skin diseases (dermatologist). You may be diagnosed based on:  Your symptoms and medical history.  A physical exam.  Testing a pus sample for infection.  Blood tests. How is this treated? Your treatment will depend on how severe your symptoms are. The same treatment will not work for everybody with this condition. You may need to try several treatments to find what works best for you. Treatment may include:  Cleaning and bandaging (dressing) your wounds as needed.  Lifestyle changes, such as new skin care routines.  Taking medicines, such as: ? Antibiotics. ? Acne medicines. ? Medicines to reduce the activity of the immune system. ? A diabetes medicine (metformin). ? Birth control pills, for women. ? Steroids to reduce swelling and pain.  Working with a mental health care provider, if you experience emotional distress due to this condition. If you have severe symptoms that do not get better with medicine, you may need surgery. Surgery may involve:  Using a laser to clear the skin and remove hair follicles.  Opening and draining deep sores.  Removing the areas of skin that are diseased and scarred. Follow these instructions at home: Medicines   Take over-the-counter and prescription medicines only as told by your health care provider.  If you were prescribed an antibiotic medicine,  take it as told by your health care provider. Do not stop taking the antibiotic even if your condition improves. Skin care  If you have open wounds, cover them with a clean dressing as told by your health care provider. Keep wounds clean by washing them gently with soap and water  when you bathe.  Do not shave the areas where you get hidradenitis suppurativa.  Do not wear deodorant.  Wear loose-fitting clothes.  Try to avoid getting overheated or sweaty. If you get sweaty or wet, change into clean, dry clothes as soon as you can.  To help relieve pain and itchiness, cover sore areas with a warm, clean washcloth (warm compress) for 5-10 minutes as often as needed.  If told by your health care provider, take a bleach bath twice a week: ? Fill your bathtub halfway with water. ? Pour in  cup of unscented household bleach. ? Soak in the tub for 5-10 minutes. ? Only soak from the neck down. Avoid water on your face and hair. ? Shower to rinse off the bleach from your skin. General instructions  Learn as much as you can about your disease so that you have an active role in your treatment. Work closely with your health care provider to find treatments that work for you.  If you are overweight, work with your health care provider to lose weight as recommended.  Do not use any products that contain nicotine or tobacco, such as cigarettes and e-cigarettes. If you need help quitting, ask your health care provider.  If you struggle with living with this condition, talk with your health care provider or work with a mental health care provider as recommended.  Keep all follow-up visits as told by your health care provider. This is important. Where to find more information  Hidradenitis Suppurativa Foundation, Inc.: https://www.hs-foundation.org/ Contact a health care provider if you have:  A flare-up of hidradenitis suppurativa.  A fever or chills.  Trouble controlling your symptoms at home.  Trouble doing your daily activities because of your symptoms.  Trouble dealing with emotional problems related to your condition. Summary  Hidradenitis suppurativa is a long-term (chronic) skin disease. It is similar to a severe form of acne, but it affects areas of the  body where acne would be unusual.  The first symptoms are usually painful bumps in the skin, similar to pimples. The condition may get worse over time (progress), or it may only cause mild symptoms.  If you have open wounds, cover them with a clean dressing as told by your health care provider. Keep wounds clean by washing them gently with soap and water when you bathe.  Besides skin care, treatment may include medicines, laser treatment, and surgery. This information is not intended to replace advice given to you by your health care provider. Make sure you discuss any questions you have with your health care provider. Document Revised: 02/09/2017 Document Reviewed: 02/09/2017 Elsevier Patient Education  2020 [employer].

## 2019-10-16 NOTE — Progress Notes (Signed)
Subjective: CC: Axillary abscess PCP: Sharion Balloon, FNP Charles Pennington is a 55 y.o. male presenting to clinic today for:  1.  Axillary abscess Patient reports several day history of right-sided axillary abscess.  He denies any drainage but does note it has a head on it.  He is not been using any therapies except for prescribed medications for pain.  He has history of left axillary abscess which required surgical resection in the past.  No fevers, chills.  He does report tenderness to the area.  2.  Chronic low back pain.   He reports chronic low back pain.  PCP recently put in a physical therapy referral.  He uses his prescribed medications for pain.  He is asking for "a shot" today.  He has history of uncontrolled diabetes with last A1c at 7.4.  He takes ibuprofen typically with no problems.  Denies any renal dysfunction or GI bleed.  Not currently on any blood thinners.   ROS: Per HPI  Allergies  Allergen Reactions  . Hydrocodone-Acetaminophen Itching and Other (See Comments)    Pt is able to tolerate with Benadryl.   . Gadolinium Derivatives Nausea And Vomiting   Past Medical History:  Diagnosis Date  . Anxiety   . Asthma   . Diabetes mellitus   . Diabetes mellitus without complication ([place])   . Hypercholesteremia   . Hypertension   . Hypothyroidism   . Low back pain   . Obesity   . SOB (shortness of breath)   . Stroke [facility]) age 74  . Stroke ([place])   . Vertigo     Current Outpatient Medications:  .  albuterol (PROVENTIL) (2.5 MG/3ML) 0.083% nebulizer solution, NEBULIZE 1 VIAL EVERY 6 HOURS AS NEEDED FOR WHEEZING OR SHORTNESS OF BREATH, Disp: 180 mL, Rfl: 0 .  albuterol (VENTOLIN HFA) 108 (90 Base) MCG/ACT inhaler, INHALE 2 PUFFS INTO THE LUNGS EVERY 6 (SIX) HOURS AS NEEDED FOR WHEEZING., Disp: 8.5 g, Rfl: 0 .  aspirin EC 81 MG tablet, Take 1 tablet (81 mg total) by mouth daily., Disp: 90 tablet, Rfl: 1 .  atorvastatin (LIPITOR) 80 MG tablet, TAKE 1 TABLET  DAILY, Disp: 30 tablet, Rfl: 0 .  baclofen (LIORESAL) 10 MG tablet, Take 10 mg by mouth 2 (two) times daily as needed., Disp: , Rfl:  .  BELBUCA 450 MCG FILM, , Disp: , Rfl:  .  blood glucose meter kit and supplies, Dispense based on patient and insurance preference. Test sugar BID and as needed, Disp: 1 each, Rfl: 0 .  CONTOUR NEXT TEST test strip, Check blood sugar twice daily Dx E11.9, Disp: 200 strip, Rfl: 3 .  dapagliflozin propanediol (FARXIGA) 10 MG TABS tablet, Take 10 mg by mouth daily., Disp: 90 tablet, Rfl: 3 .  Dulaglutide (TRULICITY) 3 GD/9.2EQ SOPN, Inject 0.5 mLs (3 mg total) as directed once a week., Disp: 6 mL, Rfl: 4 .  Fluticasone-Umeclidin-Vilant (TRELEGY ELLIPTA) 100-62.5-25 MCG/INH AEPB, Inhale 1 puff into the lungs daily., Disp: 28 each, Rfl: 2 .  ibuprofen (ADVIL,MOTRIN) 800 MG tablet, , Disp: , Rfl:  .  insulin degludec (TRESIBA FLEXTOUCH) 200 UNIT/ML FlexTouch Pen, Inject 60 Units into the skin daily., Disp: 18 mL, Rfl: 3 .  Insulin Pen Needle (ULTICARE MICRO PEN NEEDLES) 32G X 4 MM MISC, Use twice daily Dx E11.40, Disp: 200 each, Rfl: 3 .  levothyroxine (SYNTHROID) 175 MCG tablet, TAKE (1) TABLET DAILY BE- FORE BREAKFAST., Disp: 90 tablet, Rfl: 1 .  lisinopril (ZESTRIL)  20 MG tablet, Take 1 tablet (20 mg total) by mouth daily., Disp: 90 tablet, Rfl: 2 .  metFORMIN (GLUCOPHAGE-XR) 750 MG 24 hr tablet, TAKE 2 TABLET WITH MORNING MEAL, Disp: 180 tablet, Rfl: 2 .  omeprazole (PRILOSEC) 20 MG capsule, TAKE (1) CAPSULE DAILY, Disp: 90 capsule, Rfl: 1 .  PARoxetine (PAXIL) 40 MG tablet, Take 1 tablet (40 mg total) by mouth daily., Disp: 90 tablet, Rfl: 2 .  pregabalin (LYRICA) 150 MG capsule, TAKE 2 CAPSULES EVERY MORNING AND 2 CAPSULES EVERY EVENING, Disp: 120 capsule, Rfl: 2 .  zolpidem (AMBIEN) 10 MG tablet, Take 1 tablet (10 mg total) by mouth at bedtime., Disp: 30 tablet, Rfl: 5 Social History   Socioeconomic History  . Marital status: Single    Spouse name: Not on file   . Number of children: 2  . Years of education: Not on file  . Highest education level: 8th grade  Occupational History  . Occupation: disabled Clinical biochemist  Tobacco Use  . Smoking status: Current Every Day Smoker    Packs/day: 0.50    Years: 30.00    Pack years: 15.00    Types: Cigarettes  . Smokeless tobacco: Never Used  Vaping Use  . Vaping Use: Never used  Substance and Sexual Activity  . Alcohol use: No    Comment: occasional per pt  . Drug use: No  . Sexual activity: Yes  Other Topics Concern  . Not on file  Social History Narrative   ** Merged History Encounter **       Social Determinants of Health   Financial Resource Strain:   . Difficulty of Paying Living Expenses: Not on file  Food Insecurity:   . Worried About Charity fundraiser in the Last Year: Not on file  . Ran Out of Food in the Last Year: Not on file  Transportation Needs:   . Lack of Transportation (Medical): Not on file  . Lack of Transportation (Non-Medical): Not on file  Physical Activity:   . Days of Exercise per Week: Not on file  . Minutes of Exercise per Session: Not on file  Stress:   . Feeling of Stress : Not on file  Social Connections:   . Frequency of Communication with Friends and Family: Not on file  . Frequency of Social Gatherings with Friends and Family: Not on file  . Attends Religious Services: Not on file  . Active Member of Clubs or Organizations: Not on file  . Attends Archivist Meetings: Not on file  . Marital Status: Not on file  Intimate Partner Violence:   . Fear of Current or Ex-Partner: Not on file  . Emotionally Abused: Not on file  . Physically Abused: Not on file  . Sexually Abused: Not on file   Family History  Problem Relation Age of Onset  . Hypertension Father   . Suicidality Father   . Diabetes Father   . Diabetes Mother   . Colon cancer Neg Hx   . Inflammatory bowel disease Neg Hx     Objective: Office vital signs reviewed. BP 108/72    Pulse 95   Temp 97.7 F (36.5 C)   Ht 5' 8"  (1.727 m)   Wt 242 lb 6.4 oz (110 kg)   SpO2 95%   BMI 36.86 kg/m   Physical Examination:  General: Awake, alert, nontoxic, No acute distress MSK: Stiff gait and normal station Skin: Kidney bean size palpable area of induration under the right axilla.  There is a visible punctum at the apex but no active draining or palpable fluctuance.  There is mild associated erythema but no increased warmth.  Assessment/ Plan: 54 y.o. male   1. Hidradenitis suppurativa of right axilla Empiric treatment for current axillary abscess with doxycycline.  Encourage warm compresses to continue to promote draining.  We discussed red flag signs and symptoms warranting recheck.  I would like him to see his PCP back in 2 weeks, could consider continuing topical clindamycin for preventative care.  Control blood sugars will be essential in preventing recurrence. - doxycycline (VIBRA-TABS) 100 MG tablet; Take 1 tablet (100 mg total) by mouth 2 (two) times daily.  Dispense: 20 tablet; Refill: 0  2. Chronic low back pain with sciatica, sciatica laterality unspecified, unspecified back pain laterality Has referral to PT already in place.  He wanted a "shot" today and I discussed that Toradol has a risk of increased stroke and heart attack.  He accepted these risks and wants to proceed. - ketorolac (TORADOL) 30 MG/ML injection 30 mg   No orders of the defined types were placed in this encounter.  No orders of the defined types were placed in this encounter.    Janora Norlander, DO [place] [phone]

## 2019-10-24 ENCOUNTER — Other Ambulatory Visit: Payer: Self-pay | Admitting: Family

## 2019-10-24 ENCOUNTER — Ambulatory Visit: Payer: Medicare PPO | Admitting: Pharmacist

## 2019-10-25 DIAGNOSIS — M545 Low back pain: Secondary | ICD-10-CM | POA: Diagnosis not present

## 2019-10-25 DIAGNOSIS — Z79899 Other long term (current) drug therapy: Secondary | ICD-10-CM | POA: Diagnosis not present

## 2019-10-25 DIAGNOSIS — G894 Chronic pain syndrome: Secondary | ICD-10-CM | POA: Diagnosis not present

## 2019-10-25 DIAGNOSIS — G8929 Other chronic pain: Secondary | ICD-10-CM | POA: Diagnosis not present

## 2019-10-26 ENCOUNTER — Telehealth: Payer: Self-pay | Admitting: Family

## 2019-10-26 DIAGNOSIS — M5440 Lumbago with sciatica, unspecified side: Secondary | ICD-10-CM

## 2019-10-26 NOTE — Telephone Encounter (Signed)
Per Neysa Bonito Verbal okay to place referral. Referral placed.

## 2019-10-26 NOTE — Telephone Encounter (Signed)
Referral placed per [facility]

## 2019-10-31 ENCOUNTER — Other Ambulatory Visit: Payer: Self-pay

## 2019-10-31 ENCOUNTER — Encounter: Payer: Self-pay | Admitting: Physical Therapy

## 2019-10-31 ENCOUNTER — Ambulatory Visit: Payer: Medicare PPO | Attending: Family | Admitting: Physical Therapy

## 2019-10-31 DIAGNOSIS — M545 Low back pain, unspecified: Secondary | ICD-10-CM

## 2019-10-31 DIAGNOSIS — R293 Abnormal posture: Secondary | ICD-10-CM | POA: Diagnosis not present
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
## 2019-11-01 ENCOUNTER — Other Ambulatory Visit: Payer: Self-pay | Admitting: Family

## 2019-11-02 ENCOUNTER — Ambulatory Visit (INDEPENDENT_AMBULATORY_CARE_PROVIDER_SITE_OTHER): Payer: Medicare PPO | Admitting: Podiatry

## 2019-11-02 DIAGNOSIS — Z5329 Procedure and treatment not carried out because of patient's decision for other reasons: Secondary | ICD-10-CM

## 2019-11-02 NOTE — Progress Notes (Signed)
   Complete physical exam  Patient: Charles Pennington   DOB: 12/05/1997   54 y.o. Male  MRN: [id number]  Subjective:    No chief complaint on file.   Charles Pennington is a 54 y.o. male who presents today for a complete physical exam. She reports consuming a {diet types:17450} diet. {types:19826} She generally feels {DESC; WELL/FAIRLY WELL/POORLY:18703}. She reports sleeping {DESC; WELL/FAIRLY WELL/POORLY:18703}. She {does/does not:200015} have additional problems to discuss today.    Most recent fall risk assessment:    08/12/2021   10:42 AM  Fall Risk   Falls in the past year? 0  Number falls in past yr: 0  Injury with Fall? 0  Risk for fall due to : No Fall Risks  Follow up Falls evaluation completed     Most recent depression screenings:    08/12/2021   10:42 AM 07/03/2020   10:46 AM  PHQ 2/9 Scores  PHQ - 2 Score 0 0  PHQ- 9 Score 5     {VISON DENTAL STD PSA (Optional):27386}  {History (Optional):23778}  Patient Care Team: Jessup, Joy, NP as PCP - General (Nurse Practitioner)   Outpatient Medications Prior to Visit  Medication Sig   fluticasone (FLONASE) 50 MCG/ACT nasal spray Place 2 sprays into both nostrils in the morning and at bedtime. After 7 days, reduce to once daily.   norgestimate-ethinyl estradiol (SPRINTEC 28) 0.25-35 MG-MCG tablet Take 1 tablet by mouth daily.   Nystatin POWD Apply liberally to affected area 2 times per day   spironolactone (ALDACTONE) 100 MG tablet Take 1 tablet (100 mg total) by mouth daily.   No facility-administered medications prior to visit.    ROS        Objective:     There were no vitals taken for this visit. {Vitals History (Optional):23777}  Physical Exam   No results found for any visits on 09/17/21. {Show previous labs (optional):23779}    Assessment & Plan:    Routine Health Maintenance and Physical Exam  Immunization History  Administered Date(s) Administered   DTaP 02/18/1999, 04/16/1999,  06/25/1999, 03/10/2000, 09/24/2003   Hepatitis A 07/21/2007, 07/26/2008   Hepatitis B 12/06/1998, 01/13/1999, 06/25/1999   HiB (PRP-OMP) 02/18/1999, 04/16/1999, 06/25/1999, 03/10/2000   IPV 02/18/1999, 04/16/1999, 12/14/1999, 09/24/2003   Influenza,inj,Quad PF,6+ Mos 10/26/2013   Influenza-Unspecified 01/26/2012   MMR 12/13/2000, 09/24/2003   Meningococcal Polysaccharide 07/26/2011   Pneumococcal Conjugate-13 03/10/2000   Pneumococcal-Unspecified 06/25/1999, 09/08/1999   Tdap 07/26/2011   Varicella 12/14/1999, 07/21/2007    Health Maintenance  Topic Date Due   HIV Screening  Never done   Hepatitis C Screening  Never done   INFLUENZA VACCINE  09/15/2021   PAP-Cervical Cytology Screening  09/17/2021 (Originally 12/05/2019)   PAP SMEAR-Modifier  09/17/2021 (Originally 12/05/2019)   TETANUS/TDAP  09/17/2021 (Originally 07/25/2021)   HPV VACCINES  Discontinued   COVID-19 Vaccine  Discontinued    Discussed health benefits of physical activity, and encouraged her to engage in regular exercise appropriate for her age and condition.  Problem List Items Addressed This Visit   None Visit Diagnoses     Annual physical exam    -  Primary   Cervical cancer screening       Need for Tdap vaccination          No follow-ups on file.     Joy Jessup, NP   

## 2019-11-05 ENCOUNTER — Other Ambulatory Visit: Payer: Self-pay

## 2019-11-05 ENCOUNTER — Ambulatory Visit: Payer: Medicare PPO | Admitting: Physical Therapy

## 2019-11-05 DIAGNOSIS — M545 Low back pain, unspecified: Secondary | ICD-10-CM

## 2019-11-05 DIAGNOSIS — R293 Abnormal posture: Secondary | ICD-10-CM | POA: Diagnosis not present

## 2019-11-05 NOTE — Therapy (Signed)
[facility] [address] [place], [place], [address] Phone: [phone]   Fax:  [phone]  Physical Therapy Treatment  Patient Details  Name: Charles Pennington MRN: [id number] Date of Birth: [birth year] Referring Provider (PT): [name]   Encounter Date: 11/05/2019   PT End of Session - 11/05/19 1211    Visit Number 2    Number of Visits 12    Date for PT Re-Evaluation 12/12/19    PT Start Time 1118    PT Stop Time 1202    PT Time Calculation (min) 44 min    Activity Tolerance Patient tolerated treatment well    Behavior During Therapy [facility] for tasks assessed/performed           Past Medical History:  Diagnosis Date  . Anxiety   . Asthma   . Diabetes mellitus   . Diabetes mellitus without complication (HCC)   . Hypercholesteremia   . Hypertension   . Hypothyroidism   . Low back pain   . Obesity   . SOB (shortness of breath)   . Stroke [facility]) age 54  . Stroke (HCC)   . Vertigo     Past Surgical History:  Procedure Laterality Date  . BACK SURGERY  2013  . COLONOSCOPY  2009   Inflammatory changes of the cecum and ascending colon most consistent with infectious etiology, NSAID, ischemia. Suspected resolving infection based on symptomatology.  [facility] FOOT SURGERY     Dr Ulice Brilliant  . SHOULDER ARTHROSCOPY Right   . SPINE SURGERY    . WRIST SURGERY      There were no vitals filed for this visit.   Subjective Assessment - 11/05/19 1118    Subjective COVID-19 screen performed prior to patient entering clinic. Patient arrived with good response after last treatment    Pertinent History DM, HTN, stroke, Back, shoulder, wrist and foot surgery.    How long can you stand comfortably? Patient unweights his right LE.    Patient Stated Goals Get out of pain.    Currently in Pain? Yes    Pain Score 5     Pain Location Buttocks    Pain Orientation Right    Pain Descriptors / Indicators Discomfort    Pain Type Acute pain    Pain  Onset 1 to 4 weeks ago    Pain Frequency Constant    Aggravating Factors  sitting and standing    Pain Relieving Factors rest                             [facility] Adult PT Treatment/Exercise - 11/05/19 0001      Exercises   Exercises Lumbar;Knee/Hip      Lumbar Exercises: Stretches   Piriformis Stretch Right;30 seconds;3 reps      Lumbar Exercises: Supine   Ab Set 10 reps;3 seconds    Glut Set 3 seconds;10 reps    Bent Knee Raise 3 seconds   2x10   Bridge 20 reps    Straight Leg Raise 3 seconds   2x10     Modalities   Modalities Ultrasound;Geologist, engineering Location Right buttock.    Electrical Stimulation Action premod    Electrical Stimulation Parameters 80-150hz  x35min    Electrical Stimulation Goals Tone;Pain      Ultrasound   Ultrasound Location Right Piriformis/buttock    Ultrasound Parameters combo US/ES 1.5w/cm2/100%/57mhz x74min  Ultrasound Goals Pain                       PT Long Term Goals - 11/05/19 1216      PT LONG TERM GOAL #1   Title Independent with a HEP.    Time 6    Period Weeks    Status On-going      PT LONG TERM GOAL #2   Title Stand 20 minutes with pain not > 3/10.    Time 6    Period Weeks    Status On-going      PT LONG TERM GOAL #3   Title Sit 30 minutes with pain not > 3/10.    Time 6    Period Weeks    Status On-going      PT LONG TERM GOAL #4   Title Perform ADL's with pain not > 3/10.    Time 6    Period Weeks    Status On-going                 Plan - 11/05/19 1217    Clinical Impression Statement Patient tolerated treatment well today. Patient reported good response after ES treatment. Today educated patient on posture and core activation and progression. Patient responded well to modalities today and felt better after treatment. patient goals progressing today.    Personal Factors and Comorbidities Comorbidity 1;Comorbidity  3+;Comorbidity 2    Comorbidities DM, HTN, stroke, Back, shoulder, wrist and foot surgery.    Examination-Activity Limitations Locomotion Level;Other    Examination-Participation Restrictions Other    Stability/Clinical Decision Making Evolving/Moderate complexity    Rehab Potential Excellent    PT Frequency 2x / week    PT Duration 6 weeks    PT Treatment/Interventions ADLs/Self Care Home Management;Cryotherapy;Electrical Stimulation;Ultrasound;Moist Heat;Therapeutic activities;Therapeutic exercise;Manual techniques;Patient/family education;Passive range of motion    PT Next Visit Plan cont with Piriformis stretching.  Combo e'stim/US and STW/M to affected right buttock region.  Core exercise progression.    Consulted and Agree with Plan of Care Patient           Patient will benefit from skilled therapeutic intervention in order to improve the following deficits and impairments:  Abnormal gait, Increased muscle spasms, Decreased activity tolerance, Pain, Postural dysfunction, Decreased range of motion  Visit Diagnosis: Acute right-sided low back pain without sciatica  Abnormal posture     Problem List Patient Active Problem List   Diagnosis Date Noted  . Noncompliance with medications 01/21/2017  . Acute bronchitis with COPD (HCC) 11/05/2016  . Duodenal ulcer 07/23/2016  . Multiple lacerations 04/04/2016  . Cocaine abuse (HCC) 12/12/2015  . Pain medication agreement broken 12/12/2015  . Insomnia 10/28/2015  . Pain medication agreement signed 07/23/2015  . Opioid dependence (HCC) 07/23/2015  . Morbid obesity (HCC) 06/26/2015  . Chronic back pain 06/23/2015  . Rupture of ulnar collateral ligament of thumb 03/01/2014  . Memory difficulties 08/15/2013  . Diabetic neuropathy associated with type 2 diabetes mellitus (HCC) 08/15/2013  . GERD (gastroesophageal reflux disease) 07/19/2013  . GAD (generalized anxiety disorder) 07/19/2013  . Depression 07/19/2013  . Ataxia, late  effect of cerebrovascular disease 07/05/2011  . Postlaminectomy syndrome, lumbar region 07/05/2011  . Hyperlipidemia associated with type 2 diabetes mellitus (HCC) 12/30/2009  . SMOKER 12/30/2009  . Colitis, acute 05/27/2008  . Hypothyroidism 05/23/2008  . Type 2 diabetes mellitus treated with insulin (HCC) 05/23/2008  . Hypertension associated with diabetes (HCC) 05/23/2008  . History of  CVA (cerebrovascular accident) 05/23/2008  . Asthma 05/23/2008  . FATTY LIVER DISEASE 05/23/2008  . ABDOMINAL PAIN 05/23/2008    Hermelinda Dellen, PTA 11/05/2019, 12:21 PM  [facility] [address] [place], [place], [address] Phone: [phone]   Fax:  [phone]  Name: Charles Pennington MRN: [id number] Date of Birth: [birth year]

## 2019-11-06 ENCOUNTER — Other Ambulatory Visit: Payer: Self-pay

## 2019-11-06 ENCOUNTER — Ambulatory Visit (INDEPENDENT_AMBULATORY_CARE_PROVIDER_SITE_OTHER): Payer: Medicare PPO | Admitting: Pharmacist

## 2019-11-06 DIAGNOSIS — E119 Type 2 diabetes mellitus without complications: Secondary | ICD-10-CM | POA: Diagnosis not present

## 2019-11-06 MED ORDER — TRESIBA FLEXTOUCH 200 UNIT/ML ~~LOC~~ SOPN
PEN_INJECTOR | SUBCUTANEOUS | 3 refills | Status: DC
Start: 2019-11-06 — End: 2020-05-12

## 2019-11-06 MED ORDER — TRULICITY 3 MG/0.5ML ~~LOC~~ SOAJ: 3.0000 mg | SUBCUTANEOUS | 4 refills | Status: DC

## 2019-11-06 NOTE — Progress Notes (Signed)
      11/07/2019 Name: Charles Pennington MRN: [id number] DOB: 10/24/1965   S: 4 yoM presents for diabetes evaluation, education, and management Patient was referred and last seen by Primary Care Provider on8/20/21. Insurance coverage/medication affordability: dual [employer]/medicaid  Patientdeniesadherence with medications.  Current diabetes medications include:Trulicity, metformin, tresiba, farxiga  Current hypertension medications include:lisinopril  Current hyperlipidemia medications include:atorvastatin  Patientdenieshypoglycemic events.  Patient reported dietary habits: Eats2-62meals/day Reviewed diet and suggestions for patient to consider  Breakfast:Oatmeal cookie Lunch:burgers, sandwiches, etc Dinner:chicken, green beans, potatoes and cornbread Snacks:loves potato chips Drinks:mtn dew-->switched to sprite, loves water  Patient-reported exercise habits:n/a  Patientreportsnocturia (nighttime urination).  Patientreportsneuropathy (nerve pain).  Patientdeniesvisual changes.  Patientsees podiatry forfoot exams (Dr. Dallie Piles goes regularly.     O:  Lab Results  Component Value Date   HGBA1C 7.4 (H) 10/05/2019     Lipid Panel     Component Value Date/Time   CHOL 122 06/07/2019 1551   TRIG 132 06/07/2019 1551   HDL 48 06/07/2019 1551   CHOLHDL 2.5 06/07/2019 1551   LDLCALC 51 06/07/2019 1551     Home fasting blood sugars: doesn't check  2 hour post-meal/random blood sugars: n/a     A/P:  DiabetesT2DMcurrently uncontrolled. Patient is able to verbalize appropriate hypoglycemia management plan. Patientis notadherent with medications everyday.  It appears his compliance has improved based on most recent A1c of 7.4%.  Control is suboptimal due to diet/missed medication doses.  -Continued dose ofbasal insulin Tresiba(insulin degludec)to 60 units daily(new RX called in)  -Increase GLP1 Trulicity 3mg  sq  weekly (Monday)             -instructed pt to double up on 1.5mg  injections until he gets 3mg  injections             -RX for Trulicity 3mg  sq weekly called in to [facility]  -patient was still receiving 1.5mg  sq weekly prescription  -ContinuedSGLT2-I Farxiga(generic name:dapagliflozin)10mg  daily. Counseled Sick day rules: if sick, vomiting, have diarrhea, or  cannot drink enough fluids, you should stop taking SGLT-2 inhibitors until your symptoms go away. In rare cases, these medicines can cause diabetic ketoacidosis (DKA). DKA is acid buildup in the blood.  -Continue metformin XR once daily  -Extensively discussed pathophysiology of diabetes, recommended lifestyle interventions, dietary effects on blood sugar control  -Counseled on s/sx of and management of hypoglycemia  -Next A1C anticipated 3-6 months.    Written patient instructions provided.  Total time in face to face counseling 30 minutes.     , PharmD, BCPS Clinical Pharmacist, [facility]  II Phone [phone]

## 2019-11-07 ENCOUNTER — Ambulatory Visit: Payer: Medicare PPO | Admitting: Physical Therapy

## 2019-11-07 DIAGNOSIS — R293 Abnormal posture: Secondary | ICD-10-CM | POA: Diagnosis not present

## 2019-11-07 DIAGNOSIS — M545 Low back pain, unspecified: Secondary | ICD-10-CM

## 2019-11-07 NOTE — Therapy (Signed)
[facility] [address] [place], [place], [address] Phone: [phone]   Fax:  [phone]  Physical Therapy Treatment  Patient Details  Name: Charles Pennington MRN: [id number] Date of Birth: 07-01-1965 Referring Provider (PT): [name]   Encounter Date: 11/07/2019   PT End of Session - 11/07/19 1117    Visit Number 3    Number of Visits 12    Date for PT Re-Evaluation 12/12/19    PT Start Time 1116    PT Stop Time 1202    PT Time Calculation (min) 46 min    Activity Tolerance Patient tolerated treatment well    Behavior During Therapy [facility] for tasks assessed/performed           Past Medical History:  Diagnosis Date  . Anxiety   . Asthma   . Diabetes mellitus   . Diabetes mellitus without complication (HCC)   . Hypercholesteremia   . Hypertension   . Hypothyroidism   . Low back pain   . Obesity   . SOB (shortness of breath)   . [facility]roke [facility]) age 54  . Stroke (HCC)   . Vertigo     Past Surgical History:  Procedure Laterality Date  . BACK SURGERY  2013  . COLONOSCOPY  2009   Inflammatory changes of the cecum and ascending colon most consistent with infectious etiology, NSAID, ischemia. Suspected resolving infection based on symptomatology.  [facility] FOOT SURGERY     Dr Ulice Brilliant  . SHOULDER ARTHROSCOPY Right   . SPINE SURGERY    . WRIST SURGERY      There were no vitals filed for this visit.   Subjective Assessment - 11/07/19 1113    Subjective COVID-19 screen performed prior to patient entering clinic. Patient arrived with good response after last treatment yet pain returned    Pertinent History DM, HTN, stroke, Back, shoulder, wrist and foot surgery.    How long can you stand comfortably? Patient unweights his right LE.    Patient Stated Goals Get out of pain.    Currently in Pain? Yes    Pain Score 6     Pain Location Buttocks    Pain Orientation Right    Pain Descriptors / Indicators Discomfort    Pain Type  Acute pain    Pain Onset 1 to 4 weeks ago    Pain Frequency Intermittent    Aggravating Factors  sitting /standing    Pain Relieving Factors rest                             [facility] Adult PT Treatment/Exercise - 11/07/19 0001      Lumbar Exercises: Stretches   Piriformis Stretch Right;30 seconds;3 reps      Lumbar Exercises: Supine   Ab Set 10 reps;3 seconds    Glut Set 3 seconds;10 reps    Clam 3 seconds;20 reps   red band   Bent Knee Raise 3 seconds   2x10   Bridge 20 reps    Straight Leg Raise 3 seconds   2x10     Electrical Stimulation   Electrical Stimulation Location Right buttock.    Electrical Stimulation Action premod    Electrical Stimulation Parameters 80-150hz  x36min    Electrical Stimulation Goals Tone;Pain      Ultrasound   Ultrasound Location Right Piriformis/buttock    Ultrasound Parameters Combo US/ES 2 1.5w/cm2/100%/63mhz x23min    Ultrasound Goals Pain  PT Long Term Goals - 11/05/19 1216      PT LONG TERM GOAL #1   Title Independent with a HEP.    Time 6    Period Weeks    Status On-going      PT LONG TERM GOAL #2   Title Stand 20 minutes with pain not > 3/10.    Time 6    Period Weeks    Status On-going      PT LONG TERM GOAL #3   Title Sit 30 minutes with pain not > 3/10.    Time 6    Period Weeks    Status On-going      PT LONG TERM GOAL #4   Title Perform ADL's with pain not > 3/10.    Time 6    Period Weeks    Status On-going                 Plan - 11/07/19 1118    Clinical Impression Statement Patient tolerated treatment well today. Patient continues to have pain in right piriformis and glut with sitting or standing. Patient has responded well to treatments thus yet temporary relief thus far. Patient current goals ongoing.    Personal Factors and Comorbidities Comorbidity 1;Comorbidity 3+;Comorbidity 2    Comorbidities DM, HTN, stroke, Back, shoulder, wrist and foot  surgery.    Examination-Activity Limitations Locomotion Level;Other    Examination-Participation Restrictions Other    Stability/Clinical Decision Making Evolving/Moderate complexity    Rehab Potential Excellent    PT Frequency 2x / week    PT Duration 6 weeks    PT Treatment/Interventions ADLs/Self Care Home Management;Cryotherapy;Electrical Stimulation;Ultrasound;Moist Heat;Therapeutic activities;Therapeutic exercise;Manual techniques;Patient/family education;Passive range of motion    PT Next Visit Plan cont with Piriformis stretching.  Combo e'stim/US and STW/M to affected right buttock region.  Core exercise progression.    Consulted and Agree with Plan of Care Patient           Patient will benefit from skilled therapeutic intervention in order to improve the following deficits and impairments:  Abnormal gait, Increased muscle spasms, Decreased activity tolerance, Pain, Postural dysfunction, Decreased range of motion  Visit Diagnosis: Acute right-sided low back pain without sciatica  Abnormal posture     Problem List Patient Active Problem List   Diagnosis Date Noted  . Noncompliance with medications 01/21/2017  . Acute bronchitis with COPD (HCC) 11/05/2016  . Duodenal ulcer 07/23/2016  . Multiple lacerations 04/04/2016  . Cocaine abuse (HCC) 12/12/2015  . Pain medication agreement broken 12/12/2015  . Insomnia 10/28/2015  . Pain medication agreement signed 07/23/2015  . Opioid dependence (HCC) 07/23/2015  . Morbid obesity (HCC) 06/26/2015  . Chronic back pain 06/23/2015  . Rupture of ulnar collateral ligament of thumb 03/01/2014  . Memory difficulties 08/15/2013  . Diabetic neuropathy associated with type 2 diabetes mellitus (HCC) 08/15/2013  . GERD (gastroesophageal reflux disease) 07/19/2013  . GAD (generalized anxiety disorder) 07/19/2013  . Depression 07/19/2013  . 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 Postlaminectomy syndrome, lumbar  region 07/05/2011  . Hyperlipidemia associated with type 2 diabetes mellitus (HCC) 12/30/2009  . SMOKER 12/30/2009  . Colitis, acute 05/27/2008  . Hypothyroidism 05/23/2008  . Type 2 diabetes mellitus treated with insulin (HCC) 05/23/2008  . Hypertension associated with diabetes (HCC) 05/23/2008  . History of CVA (cerebrovascular accident) 05/23/2008  . Asthma 05/23/2008  . FATTY LIVER DISEASE 05/23/2008  . ABDOMINAL PAIN 05/23/2008    Annlouise Gerety P, PTA 11/07/2019,  12:02 PM  [facility] [address] [place], [place], [address] Phone: [phone]   Fax:  [phone]  Name: GEORGE HAGGART MRN: [id number] Date of Birth: [birth year]

## 2019-11-13 DIAGNOSIS — M79671 Pain in right foot: Secondary | ICD-10-CM | POA: Diagnosis not present

## 2019-11-13 DIAGNOSIS — M7741 Metatarsalgia, right foot: Secondary | ICD-10-CM | POA: Diagnosis not present

## 2019-11-15 ENCOUNTER — Ambulatory Visit (INDEPENDENT_AMBULATORY_CARE_PROVIDER_SITE_OTHER): Payer: Medicare PPO | Admitting: Family Medicine

## 2019-11-15 ENCOUNTER — Encounter: Payer: Self-pay | Admitting: Family Medicine

## 2019-11-15 ENCOUNTER — Other Ambulatory Visit: Payer: Self-pay

## 2019-11-15 VITALS — BP 147/92 | HR 96 | Temp 97.2°F | Ht 68.0 in | Wt 236.2 lb

## 2019-11-15 DIAGNOSIS — M5431 Sciatica, right side: Secondary | ICD-10-CM | POA: Diagnosis not present

## 2019-11-15 DIAGNOSIS — H6122 Impacted cerumen, left ear: Secondary | ICD-10-CM

## 2019-11-15 MED ORDER — PREDNISONE 10 MG (21) PO TBPK: ORAL_TABLET | ORAL | 0 refills | Status: DC

## 2019-11-15 NOTE — Patient Instructions (Signed)
Debrox wax removal kit over the counter. Drops (3-4) should be instilled twice daily x3 days, then the ear flushed with warm water and hydrogen peroxide on the 4th day.

## 2019-11-15 NOTE — Progress Notes (Signed)
Assessment & Plan:  1. Right sided sciatica - predniSONE (STERAPRED UNI-PAK 21 TAB) 10 MG (21) TBPK tablet; As directed x 6 days  Dispense: 21 tablet; Refill: 0 - For home use only DME Other see comment (TENS UNIT).   2. Impacted cerumen of left ear - Encouraged to purchase Debrox wax removal kit over the counter. Drops (3-4) should be instilled twice daily x3 days, then the ear flushed with warm water and hydrogen peroxide on the 4th day.    Follow up plan: Return if symptoms worsen or fail to improve.  Hendricks Limes, MSN, APRN, FNP-C [facility]  Subjective:   Patient ID: Charles Pennington, male    DOB: [birth year], 54 y.o.   MRN: [id number]  HPI: Charles Pennington is a 54 y.o. male presenting on 11/15/2019 for Sciatica (Patient states that he has right leg pain that goes down leg x 1 month.) and Ear Pain (Left x 4 days)  Patient reports right-sided sciatica x1 month.  He has previously done physical therapy where they used a TENS unit, which was very helpful.  He would like an order for one to use at home.  Patient also reports left-sided ear discomfort x4 days.  He has tried instilling sweet oil and hydrogen peroxide.   ROS: Negative unless specifically indicated above in HPI.   Relevant past medical history reviewed and updated as indicated.   Allergies and medications reviewed and updated.   Current Outpatient Medications:  .  albuterol (PROVENTIL) (2.5 MG/3ML) 0.083% nebulizer solution, NEBULIZE 1 VIAL EVERY 6 HOURS AS NEEDED FOR WHEEZING OR SHORTNESS OF BREATH, Disp: 180 mL, Rfl: 0 .  albuterol (VENTOLIN HFA) 108 (90 Base) MCG/ACT inhaler, INHALE 2 PUFFS INTO THE LUNGS EVERY 6 (SIX) HOURS AS NEEDED FOR WHEEZING., Disp: 8.5 g, Rfl: 0 .  aspirin EC 81 MG tablet, Take 1 tablet (81 mg total) by mouth daily., Disp: 90 tablet, Rfl: 1 .  atorvastatin (LIPITOR) 80 MG tablet, TAKE 1 TABLET DAILY, Disp: 30 tablet, Rfl: 0 .  baclofen (LIORESAL) 10 MG  tablet, Take 10 mg by mouth 2 (two) times daily as needed., Disp: , Rfl:  .  BELBUCA 450 MCG FILM, , Disp: , Rfl:  .  blood glucose meter kit and supplies, Dispense based on patient and insurance preference. Test sugar BID and as needed, Disp: 1 each, Rfl: 0 .  CONTOUR NEXT TEST test strip, Check blood sugar twice daily Dx E11.9, Disp: 200 strip, Rfl: 3 .  dapagliflozin propanediol (FARXIGA) 10 MG TABS tablet, Take 10 mg by mouth daily., Disp: 90 tablet, Rfl: 3 .  Dulaglutide (TRULICITY) 3 GB/2.0FE SOPN, Inject 3 mg as directed once a week. Discontinue 1.80m sq week dose, Disp: 6 mL, Rfl: 4 .  Fluticasone-Umeclidin-Vilant (TRELEGY ELLIPTA) 100-62.5-25 MCG/INH AEPB, Inhale 1 puff into the lungs daily., Disp: 28 each, Rfl: 2 .  ibuprofen (ADVIL,MOTRIN) 800 MG tablet, , Disp: , Rfl:  .  insulin degludec (TRESIBA FLEXTOUCH) 200 UNIT/ML FlexTouch Pen, Inject 60-120 Units into the skin daily. MAX daily dose of 120 units, Disp: 18 mL, Rfl: 3 .  Insulin Pen Needle (ULTICARE MICRO PEN NEEDLES) 32G X 4 MM MISC, Use twice daily Dx E11.40, Disp: 200 each, Rfl: 3 .  levothyroxine (SYNTHROID) 175 MCG tablet, TAKE (1) TABLET DAILY BE- FORE BREAKFAST., Disp: 90 tablet, Rfl: 1 .  lisinopril (ZESTRIL) 20 MG tablet, Take 1 tablet (20 mg total) by mouth daily., Disp: 90 tablet, Rfl: 2 .  metFORMIN (GLUCOPHAGE-XR) 750 MG 24 hr tablet, TAKE 2 TABLET WITH MORNING MEAL, Disp: 180 tablet, Rfl: 2 .  omeprazole (PRILOSEC) 20 MG capsule, TAKE (1) CAPSULE DAILY, Disp: 90 capsule, Rfl: 1 .  PARoxetine (PAXIL) 40 MG tablet, Take 1 tablet (40 mg total) by mouth daily., Disp: 90 tablet, Rfl: 2 .  pregabalin (LYRICA) 150 MG capsule, TAKE 2 CAPSULES EVERY MORNING AND 2 CAPSULES EVERY EVENING, Disp: 120 capsule, Rfl: 2 .  zolpidem (AMBIEN) 10 MG tablet, Take 1 tablet (10 mg total) by mouth at bedtime., Disp: 30 tablet, Rfl: 5  Allergies  Allergen Reactions  . Hydrocodone-Acetaminophen Itching and Other (See Comments)    Pt is able  to tolerate with Benadryl.   . Gadolinium Derivatives Nausea And Vomiting    Objective:   BP (!) 147/92   Pulse 96   Temp (!) 97.2 F (36.2 C) (Temporal)   Ht 5' 8"  (1.727 m)   Wt 236 lb 3.2 oz (107.1 kg)   SpO2 96%   BMI 35.91 kg/m    Physical Exam Vitals reviewed.  Constitutional:      General: He is not in acute distress.    Appearance: Normal appearance. He is obese. He is not ill-appearing, toxic-appearing or diaphoretic.  HENT:     Head: Normocephalic and atraumatic.     Right Ear: Tympanic membrane, ear canal and external ear normal. There is no impacted cerumen.     Left Ear: Ear canal and external ear normal. There is impacted cerumen.  Eyes:     General: No scleral icterus.       Right eye: No discharge.        Left eye: No discharge.     Conjunctiva/sclera: Conjunctivae normal.  Cardiovascular:     Rate and Rhythm: Normal rate.  Pulmonary:     Effort: Pulmonary effort is normal. No respiratory distress.  Musculoskeletal:        General: Normal range of motion.     Cervical back: Normal range of motion.  Skin:    General: Skin is warm and dry.  Neurological:     Mental Status: He is alert and oriented to person, place, and time. Mental status is at baseline.  Psychiatric:        Mood and Affect: Mood normal.        Behavior: Behavior normal.        Thought Content: Thought content normal.        Judgment: Judgment normal.

## 2019-11-23 ENCOUNTER — Other Ambulatory Visit: Payer: Self-pay | Admitting: Family

## 2019-11-23 DIAGNOSIS — J209 Acute bronchitis, unspecified: Secondary | ICD-10-CM

## 2019-11-23 DIAGNOSIS — R062 Wheezing: Secondary | ICD-10-CM

## 2019-11-23 DIAGNOSIS — M5136 Other intervertebral disc degeneration, lumbar region: Secondary | ICD-10-CM | POA: Diagnosis not present

## 2019-11-23 DIAGNOSIS — G894 Chronic pain syndrome: Secondary | ICD-10-CM | POA: Diagnosis not present

## 2019-11-23 DIAGNOSIS — Z79899 Other long term (current) drug therapy: Secondary | ICD-10-CM | POA: Diagnosis not present

## 2019-11-27 ENCOUNTER — Telehealth: Payer: Self-pay

## 2019-11-27 ENCOUNTER — Ambulatory Visit: Payer: Medicare PPO | Attending: Family | Admitting: Physical Therapy

## 2019-11-27 DIAGNOSIS — G8929 Other chronic pain: Secondary | ICD-10-CM

## 2019-11-27 DIAGNOSIS — M545 Low back pain, unspecified: Secondary | ICD-10-CM | POA: Insufficient documentation

## 2019-11-27 DIAGNOSIS — R293 Abnormal posture: Secondary | ICD-10-CM | POA: Insufficient documentation

## 2019-11-27 DIAGNOSIS — M5440 Lumbago with sciatica, unspecified side: Secondary | ICD-10-CM

## 2019-11-27 NOTE — Telephone Encounter (Signed)
Will fax rx

## 2019-11-30 ENCOUNTER — Other Ambulatory Visit: Payer: Self-pay | Admitting: Family

## 2019-12-03 MED ORDER — LEVOTHYROXINE SODIUM 175 MCG PO TABS: ORAL_TABLET | ORAL | 0 refills | Status: DC

## 2019-12-03 MED ORDER — ALBUTEROL SULFATE HFA 108 (90 BASE) MCG/ACT IN AERS: INHALATION_SPRAY | RESPIRATORY_TRACT | 1 refills | Status: DC

## 2019-12-03 NOTE — Telephone Encounter (Signed)
E-prescribe down. resent 

## 2019-12-03 NOTE — Addendum Note (Signed)
Addended by: Julious Payer D on: 12/03/2019 09:44 AM   Modules accepted: Orders

## 2019-12-05 ENCOUNTER — Other Ambulatory Visit: Payer: Self-pay

## 2019-12-05 ENCOUNTER — Ambulatory Visit: Payer: Medicare PPO | Admitting: Physical Therapy

## 2019-12-05 DIAGNOSIS — M545 Low back pain, unspecified: Secondary | ICD-10-CM

## 2019-12-05 DIAGNOSIS — R293 Abnormal posture: Secondary | ICD-10-CM | POA: Diagnosis not present

## 2019-12-05 NOTE — Therapy (Signed)
[facility] [address] [place], [place], [address] Phone: [phone]   Fax:  [phone]  Physical Therapy Treatment  Patient Details  Name: Charles Pennington MRN: [id number] Date of Birth: [birth year] Referring Provider (PT): [name]   Encounter Date: 12/05/2019   PT End of Session - 12/05/19 1150    Visit Number 4    Number of Visits 12    Date for PT Re-Evaluation 12/12/19    PT Start Time 1116    PT Stop Time 1158    PT Time Calculation (min) 42 min    Activity Tolerance Patient tolerated treatment well    Behavior During Therapy [facility] for tasks assessed/performed           Past Medical History:  Diagnosis Date  . Anxiety   . Asthma   . Diabetes mellitus   . Diabetes mellitus without complication (HCC)   . Hypercholesteremia   . Hypertension   . Hypothyroidism   . Low back pain   . Obesity   . SOB (shortness of breath)   . Stroke [facility]) age 55  . Stroke (HCC)   . Vertigo     Past Surgical History:  Procedure Laterality Date  . BACK SURGERY  2013  . COLONOSCOPY  2009   Inflammatory changes of the cecum and ascending colon most consistent with infectious etiology, NSAID, ischemia. Suspected resolving infection based on symptomatology.  [facility] Kitchen FOOT SURGERY     Dr Ulice Brilliant  . SHOULDER ARTHROSCOPY Right   . SPINE SURGERY    . WRIST SURGERY      There were no vitals filed for this visit.   Subjective Assessment - 12/05/19 1120    Subjective COVID-19 screen performed prior to patient entering clinic. Patient arrived some ongoing pain    Pertinent History DM, HTN, stroke, Back, shoulder, wrist and foot surgery.    How long can you stand comfortably? Patient unweights his right LE.    Patient Stated Goals Get out of pain.    Currently in Pain? Yes    Pain Score 6     Pain Location Buttocks    Pain Orientation Right    Pain Descriptors / Indicators Discomfort    Pain Type Acute pain    Pain Onset More than a  month ago    Pain Frequency Intermittent    Aggravating Factors  prolong sitting/ prolong standing    Pain Relieving Factors at rest                             [facility] Adult PT Treatment/Exercise - 12/05/19 0001      Lumbar Exercises: Stretches   Piriformis Stretch Right;30 seconds;3 reps      Lumbar Exercises: Supine   Ab Set 1 second;5 seconds    Glut Set 5 seconds;20 reps    Clam 3 seconds;20 reps   red band   Bent Knee Raise 3 seconds   2x15   Bridge 20 reps    Straight Leg Raise 3 seconds   2x10     Electrical Stimulation   Electrical Stimulation Location Right buttock.    Electrical Stimulation Action premod    Electrical Stimulation Parameters 80-150hz  x39min    Electrical Stimulation Goals Tone;Pain      Ultrasound   Ultrasound Location Right piriformis    Ultrasound Parameters combo US/ES @ 1.5w/cm2/100%/54mhz x74min    Ultrasound Goals Pain  PT Long Term Goals - 12/05/19 1155      PT LONG TERM GOAL #1   Title Independent with a HEP.    Time 6    Period Weeks    Status On-going      PT LONG TERM GOAL #2   Title Stand 20 minutes with pain not > 3/10.    Baseline 6-7/10 reported 12/05/19    Time 6    Period Weeks    Status On-going      PT LONG TERM GOAL #3   Title Sit 30 minutes with pain not > 3/10.    Baseline 6-7/10 reported 12/05/19    Time 6    Period Weeks    Status On-going      PT LONG TERM GOAL #4   Title Perform ADL's with pain not > 3/10.    Baseline 6-7/10 reported 12/05/19    Time 6    Period Weeks    Status On-going                 Plan - 12/05/19 1157    Clinical Impression Statement Patient tolerated treatment fair today due to pain limitations. Patient reported overall improvement from therapy. Patient continues to have 6-7/10 pain with prolong standing or sitting and with ADL's. Patient feels relief after treatments yet only down to 5/10. Goals ongoing today.     Personal Factors and Comorbidities Comorbidity 1;Comorbidity 3+;Comorbidity 2    Comorbidities DM, HTN, stroke, Back, shoulder, wrist and foot surgery.    Examination-Activity Limitations Locomotion Level;Other    Examination-Participation Restrictions Other    Stability/Clinical Decision Making Evolving/Moderate complexity    Rehab Potential Excellent    PT Frequency 2x / week    PT Duration 6 weeks    PT Treatment/Interventions ADLs/Self Care Home Management;Cryotherapy;Electrical Stimulation;Ultrasound;Moist Heat;Therapeutic activities;Therapeutic exercise;Manual techniques;Patient/family education;Passive range of motion    PT Next Visit Plan cont with Piriformis stretching.  Combo e'stim/US and STW/M to affected right buttock region.  Core exercise progression. Issue HEP next visit    Consulted and Agree with Plan of Care Patient           Patient will benefit from skilled therapeutic intervention in order to improve the following deficits and impairments:  Abnormal gait, Increased muscle spasms, Decreased activity tolerance, Pain, Postural dysfunction, Decreased range of motion  Visit Diagnosis: Acute right-sided low back pain without sciatica  Abnormal posture     Problem List Patient Active Problem List   Diagnosis Date Noted  . Noncompliance with medications 01/21/2017  . Acute bronchitis with COPD (HCC) 11/05/2016  . Duodenal ulcer 07/23/2016  . Multiple lacerations 04/04/2016  . Cocaine abuse (HCC) 12/12/2015  . Pain medication agreement broken 12/12/2015  . Insomnia 10/28/2015  . Pain medication agreement signed 07/23/2015  . Opioid dependence (HCC) 07/23/2015  . Morbid obesity (HCC) 06/26/2015  . Chronic back pain 06/23/2015  . Rupture of ulnar collateral ligament of thumb 03/01/2014  . Memory difficulties 08/15/2013  . Diabetic neuropathy associated with type 2 diabetes mellitus (HCC) 08/15/2013  . GERD (gastroesophageal reflux disease) 07/19/2013  . GAD  (generalized anxiety disorder) 07/19/2013  . Depression 07/19/2013  . 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 Postlaminectomy syndrome, lumbar region 07/05/2011  . Hyperlipidemia associated with type 2 diabetes mellitus (HCC) 12/30/2009  . SMOKER 12/30/2009  . Colitis, acute 05/27/2008  . Hypothyroidism 05/23/2008  . Type 2 diabetes mellitus treated with insulin (HCC) 05/23/2008  . Hypertension associated with diabetes (HCC)  05/23/2008  . History of CVA (cerebrovascular accident) 05/23/2008  . Asthma 05/23/2008  . FATTY LIVER DISEASE 05/23/2008  . ABDOMINAL PAIN 05/23/2008    Hermelinda Dellen, PTA 12/05/2019, 12:05 PM  [facility] [address] [place], [place], [address] Phone: [phone]   Fax:  [phone]  Name: Charles Pennington MRN: [id number] Date of Birth: 05-15-1965

## 2019-12-10 ENCOUNTER — Other Ambulatory Visit: Payer: Medicare PPO

## 2019-12-10 DIAGNOSIS — Z20822 Contact with and (suspected) exposure to covid-19: Secondary | ICD-10-CM

## 2019-12-11 ENCOUNTER — Telehealth (INDEPENDENT_AMBULATORY_CARE_PROVIDER_SITE_OTHER): Payer: Medicare PPO | Admitting: Family

## 2019-12-11 ENCOUNTER — Encounter: Payer: Self-pay | Admitting: Family

## 2019-12-11 DIAGNOSIS — M5441 Lumbago with sciatica, right side: Secondary | ICD-10-CM | POA: Diagnosis not present

## 2019-12-11 DIAGNOSIS — G8929 Other chronic pain: Secondary | ICD-10-CM

## 2019-12-11 LAB — SARS-COV-2, NAA 2 DAY TAT

## 2019-12-11 LAB — NOVEL CORONAVIRUS, NAA: SARS-CoV-2, NAA: NOT DETECTED

## 2019-12-11 MED ORDER — PREDNISONE 10 MG (21) PO TBPK: ORAL_TABLET | ORAL | 0 refills | Status: DC

## 2019-12-11 NOTE — Progress Notes (Signed)
   Virtual Visit via telephone Note Due to COVID-19 pandemic this visit was conducted virtually. This visit type was conducted due to national recommendations for restrictions regarding the COVID-19 Pandemic (e.g. social distancing, sheltering in place) in an effort to limit this patient's exposure and mitigate transmission in our community. All issues noted in this document were discussed and addressed.  A physical exam was not performed with this format.  I connected with Charles Pennington on 12/11/19 at 12:59 pm by telephone and verified that I am speaking with the correct person using two identifiers. Charles Pennington is currently located at home and no one is currently with him  during visit. The provider, [name], FNP is located in their office at time of visit.  I discussed the limitations, risks, security and privacy concerns of performing an evaluation and management service by telephone and the availability of in person appointments. I also discussed with the patient that there may be a patient responsible charge related to this service. The patient expressed understanding and agreed to proceed.   History and Present Illness:  Pt calls the office today with recurrent back pain. She is followed by PT and uses TED's unit that really helps.  Back Pain This is a chronic problem. The current episode started more than 1 year ago. The problem occurs intermittently. The problem has been waxing and waning since onset. The pain is present in the lumbar spine. The quality of the pain is described as aching. The pain radiates to the right thigh. The pain is at a severity of 7/10. The pain is moderate. The symptoms are aggravated by bending, twisting and standing. Associated symptoms include numbness. Risk factors include obesity and sedentary lifestyle. He has tried muscle relaxant and bed rest for the symptoms. The treatment provided mild relief.      Review of Systems  Musculoskeletal:  Positive for back pain.  Neurological: Positive for numbness.  All other systems reviewed and are negative.    Observations/Objective: No SOB or distress noted, happy upbeat  Assessment and Plan: 1. Chronic right-sided low back pain with right-sided sciatica Rest Continue with PT Strict low carb diet Continue with ROM exercises  RTO if symptoms worsen or do not improve  - predniSONE (STERAPRED UNI-PAK 21 TAB) 10 MG (21) TBPK tablet; Use as directed  Dispense: 21 tablet; Refill: 0      I discussed the assessment and treatment plan with the patient. The patient was provided an opportunity to ask questions and all were answered. The patient agreed with the plan and demonstrated an understanding of the instructions.   The patient was advised to call back or seek an in-person evaluation if the symptoms worsen or if the condition fails to improve as anticipated.  The above assessment and management plan was discussed with the patient. The patient verbalized understanding of and has agreed to the management plan. Patient is aware to call the clinic if symptoms persist or worsen. Patient is aware when to return to the clinic for a follow-up visit. Patient educated on when it is appropriate to go to the emergency department.   Time call ended:  1:11 pm  I provided 12 minutes of non-face-to-face time during this encounter.    [name], FNP

## 2019-12-22 ENCOUNTER — Other Ambulatory Visit: Payer: Self-pay | Admitting: Family

## 2019-12-22 DIAGNOSIS — R062 Wheezing: Secondary | ICD-10-CM

## 2019-12-22 DIAGNOSIS — J440 Chronic obstructive pulmonary disease with acute lower respiratory infection: Secondary | ICD-10-CM

## 2019-12-22 DIAGNOSIS — J209 Acute bronchitis, unspecified: Secondary | ICD-10-CM

## 2019-12-22 DIAGNOSIS — K219 Gastro-esophageal reflux disease without esophagitis: Secondary | ICD-10-CM

## 2019-12-24 DIAGNOSIS — Z79899 Other long term (current) drug therapy: Secondary | ICD-10-CM | POA: Diagnosis not present

## 2019-12-24 DIAGNOSIS — G894 Chronic pain syndrome: Secondary | ICD-10-CM | POA: Diagnosis not present

## 2019-12-24 DIAGNOSIS — M5136 Other intervertebral disc degeneration, lumbar region: Secondary | ICD-10-CM | POA: Diagnosis not present

## 2019-12-26 ENCOUNTER — Ambulatory Visit: Payer: Medicare PPO | Attending: Family | Admitting: Physical Therapy

## 2019-12-26 ENCOUNTER — Other Ambulatory Visit: Payer: Self-pay

## 2019-12-26 DIAGNOSIS — M545 Low back pain, unspecified: Secondary | ICD-10-CM | POA: Diagnosis not present

## 2019-12-26 DIAGNOSIS — R293 Abnormal posture: Secondary | ICD-10-CM | POA: Insufficient documentation

## 2019-12-26 NOTE — Therapy (Signed)
[facility] [place], [place], [address] Phone: [phone]   Fax:  [phone]  Physical Therapy Treatment  Patient Details  Name: Charles Pennington MRN: [id number] Date of Birth: 1965/12/12 Referring Provider (PT): Evelina Dun   Encounter Date: 12/26/2019   PT End of Session - 12/26/19 1305    Visit Number 5    Number of Visits 12    Date for PT Re-Evaluation 12/12/19    PT Start Time 0100    PT Stop Time 0141    PT Time Calculation (min) 41 min    Activity Tolerance Patient tolerated treatment well    Behavior During Therapy [facility] for tasks assessed/performed           Past Medical History:  Diagnosis Date  . Anxiety   . Asthma   . Diabetes mellitus   . Diabetes mellitus without complication ([place])   . Hypercholesteremia   . Hypertension   . Hypothyroidism   . Low back pain   . Obesity   . SOB (shortness of breath)   . Stroke [name]) age 54  . Stroke ([place])   . Vertigo     Past Surgical History:  Procedure Laterality Date  . BACK SURGERY  2013  . COLONOSCOPY  2009   Inflammatory changes of the cecum and ascending colon most consistent with infectious etiology, NSAID, ischemia. Suspected resolving infection based on symptomatology.  [facility] FOOT SURGERY     Dr Irving Shows  . SHOULDER ARTHROSCOPY Right   . SPINE SURGERY    . WRIST SURGERY      There were no vitals filed for this visit.   Subjective Assessment - 12/26/19 1302    Subjective COVID-19 screen performed prior to patient entering clinic. Patient arrived with ongoing pain 9/10 has been out of therapy for awhile yet would like to continue, order expired yet will send for recert today    Pertinent History DM, HTN, stroke, Back, shoulder, wrist and foot surgery.    How long can you stand comfortably? Patient unweights his right LE.    Patient Stated Goals Get out of pain.    Pain Score 9     Pain Location Buttocks    Pain Orientation Right    Pain  Descriptors / Indicators Discomfort;Throbbing    Pain Type Chronic pain    Pain Onset More than a month ago    Pain Frequency Intermittent    Aggravating Factors  prolong walking /prolong standing    Pain Relieving Factors rest                             [facility] Adult PT Treatment/Exercise - 12/26/19 0001      Lumbar Exercises: Aerobic   Nustep L3 x60mn UE/LE activity, posture focus and activity tolerance progression      Lumbar Exercises: Standing   Scapular Retraction Strengthening;Both;20 reps;Theraband    Scapular Retraction Limitations Blue XTS    Shoulder Extension Strengthening;Both;20 reps;Theraband    Shoulder Extension Limitations Blue XTS    Other Standing Lumbar Exercises Chops with Blue XTS 2x10 each way with core focus      Lumbar Exercises: Supine   Ab Set 10 reps;20 reps    Clam 3 seconds;20 reps;10 reps    Clam Limitations red t-band    Bent Knee Raise 3 seconds   2x20   Bridge 20 reps    Straight Leg Raise 3 seconds  2x10 each  LE     Electrical Stimulation   Electrical Stimulation Location Right buttock.    Electrical Stimulation Action premod    Electrical Stimulation Parameters 80-_0  x52mn    EChild psychotherapistTone;Pain                  PT Education - 12/26/19 1314    Education Details HEP progression    Person(s) Educated Patient    Methods Explanation;Handout    Comprehension Verbalized understanding;Returned demonstration               PT Long Term Goals - 12/26/19 1306      PT LONG TERM GOAL #1   Title Independent with a HEP.    Baseline issued HEP today 12/26/19    Time 6    Period Weeks    Status On-going      PT LONG TERM GOAL #2   Title Stand 20 minutes with pain not > 3/10.    Baseline 9/10 per reported 12/26/19    Time 6    Period Weeks    Status On-going      PT LONG TERM GOAL #3   Title Sit 30 minutes with pain not > 3/10.    Baseline Patient reported a few minutes of sitting  12/26/19    Time 6    Period Weeks    Status On-going      PT LONG TERM GOAL #4   Title Perform ADL's with pain not > 3/10.    Baseline 9/10 per reported 12/26/19    Time 6    Period Weeks    Status On-going                 Plan - 12/26/19 1307    Clinical Impression Statement Patient tolerated treatment well today and able to perform all exercises. Today progressed patient with posture and core activation progression activities. Patient was able to perform nustep for 10 min with no complaints.Patient continues to report 8/10 pain and more with prolong sitting standing and ADL's. No further goals met at this time due to reported pain limitations. HEP issued for home progression.    Personal Factors and Comorbidities Comorbidity 1;Comorbidity 3+;Comorbidity 2    Comorbidities DM, HTN, stroke, Back, shoulder, wrist and foot surgery.    Examination-Activity Limitations Locomotion Level;Other    Examination-Participation Restrictions Other    Stability/Clinical Decision Making Evolving/Moderate complexity    Rehab Potential Excellent    PT Frequency 2x / week    PT Duration 6 weeks    PT Treatment/Interventions ADLs/Self Care Home Management;Cryotherapy;Electrical Stimulation;Ultrasound;Moist Heat;Therapeutic activities;Therapeutic exercise;Manual techniques;Patient/family education;Passive range of motion    PT Next Visit Plan sent for recert and awaiting signature then cont with POC    Consulted and Agree with Plan of Care Patient           Patient will benefit from skilled therapeutic intervention in order to improve the following deficits and impairments:  Abnormal gait, Increased muscle spasms, Decreased activity tolerance, Pain, Postural dysfunction, Decreased range of motion  Visit Diagnosis: Acute right-sided low back pain without sciatica  Abnormal posture     Problem List Patient Active Problem List   Diagnosis Date Noted  . Noncompliance with medications  01/21/2017  . Acute bronchitis with COPD ([place] 11/05/2016  . Duodenal ulcer 07/23/2016  . Multiple lacerations 04/04/2016  . Cocaine abuse ([place] 12/12/2015  . Pain medication agreement broken 12/12/2015  . Insomnia 10/28/2015  . Pain medication  agreement signed 07/23/2015  . Opioid dependence ([place]) 07/23/2015  . Morbid obesity ([place]) 06/26/2015  . Chronic back pain 06/23/2015  . Rupture of ulnar collateral ligament of thumb 03/01/2014  . Memory difficulties 08/15/2013  . Diabetic neuropathy associated with type 2 diabetes mellitus ([place]) 08/15/2013  . GERD (gastroesophageal reflux disease) 07/19/2013  . GAD (generalized anxiety disorder) 07/19/2013  . Depression 07/19/2013  . 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 Postlaminectomy syndrome, lumbar region 07/05/2011  . Hyperlipidemia associated with type 2 diabetes mellitus ([place]) 12/30/2009  . SMOKER 12/30/2009  . Colitis, acute 05/27/2008  . Hypothyroidism 05/23/2008  . Type 2 diabetes mellitus treated with insulin ([place]) 05/23/2008  . Hypertension associated with diabetes ([place]) 05/23/2008  . History of CVA (cerebrovascular accident) 05/23/2008  . Asthma 05/23/2008  . FATTY LIVER DISEASE 05/23/2008  . ABDOMINAL PAIN 05/23/2008    Ladean Raya, PTA 12/26/19 1:41 PM  [facility] [address] [place], [place], [address] Phone: [phone]   Fax:  [phone]  Name: CALEY VOLKERT MRN: [id number] Date of Birth: November 01, 1965

## 2019-12-26 NOTE — Patient Instructions (Signed)
Pelvic Tilt: Posterior - Legs Bent (Supine)  Tighten stomach and flatten back by rolling pelvis down. Hold _10___ seconds. Relax. Repeat _10-30___ times per set. Do __2__ sets per session. Do _2___ sessions per day.   Bent Leg Lift (Hook-Lying)  Tighten stomach and slowly raise right leg _5___ inches from floor. Keep trunk rigid. Hold _3___ seconds. Repeat _10___ times per set. Do ___2-3_ sets per session. Do __2__ sessions per day.   Straight Leg Raise  Tighten stomach and slowly raise locked right leg __4__ inches from floor. Repeat __10-30__ times per set. Do __2__ sets per session. Do __2__ sessions per day.  Bridging  Slowly raise buttocks from floor, keeping stomach tight. Repeat _10___ times per set. Do __2__ sets per session. Do __2__ sessions per day.     Scapular Retraction: Bilateral  Facing anchor, pull arms back, bringing shoulder blades together. Repeat _30___ times per set. Do __2-3__ sets per session. Do _2___ sessions per day.    Standing lat pull with theraband  Anchor bands higher than your head. Start with your arms straight out in front of you at shoulder height (or a little above).  Pull bands down next to your body and then slowly return to the starting position.  10-30 x1day    

## 2019-12-27 ENCOUNTER — Encounter: Payer: Self-pay | Admitting: Family

## 2019-12-27 ENCOUNTER — Ambulatory Visit (INDEPENDENT_AMBULATORY_CARE_PROVIDER_SITE_OTHER): Payer: Medicare PPO | Admitting: Family

## 2019-12-27 DIAGNOSIS — G8929 Other chronic pain: Secondary | ICD-10-CM | POA: Diagnosis not present

## 2019-12-27 DIAGNOSIS — M5441 Lumbago with sciatica, right side: Secondary | ICD-10-CM | POA: Diagnosis not present

## 2019-12-27 NOTE — Progress Notes (Signed)
   Virtual Visit via telephone Note Due to COVID-19 pandemic this visit was conducted virtually. This visit type was conducted due to national recommendations for restrictions regarding the COVID-19 Pandemic (e.g. social distancing, sheltering in place) in an effort to limit this patient's exposure and mitigate transmission in our community. All issues noted in this document were discussed and addressed.  A physical exam was not performed with this format.  I connected with Charles Pennington on 12/27/19 at 3:15 pm  by telephone and verified that I am speaking with the correct person using two identifiers. Charles Pennington is currently located at home and no one is currently with him  during visit. The provider, [name], FNP is located in their office at time of visit.  I discussed the limitations, risks, security and privacy concerns of performing an evaluation and management service by telephone and the availability of in person appointments. I also discussed with the patient that there may be a patient responsible charge related to this service. The patient expressed understanding and agreed to proceed.   History and Present Illness:  Pt calls the office today with chronic lower back pain radiating in right leg. He had a MRI completed in 03/09/18 that showed " PLIF L3-4 without stenosis  Broad-based disc protrusion L4-5 with moderate spinal stenosis. Severe subarticular and foraminal stenosis bilaterally L4-5 unchanged from the prior study."  He has been working with PT with mild relief.  He is followed by Pain Management monthly.  Back Pain This is a chronic problem. The current episode started more than 1 year ago. The problem occurs intermittently. The problem has been waxing and waning since onset. The pain is present in the lumbar spine. The quality of the pain is described as burning and aching. The pain radiates to the right thigh. The pain is at a severity of 7/10. The pain  is moderate. The symptoms are aggravated by standing and twisting. Associated symptoms include numbness and weakness. He has tried bed rest, muscle relaxant and NSAIDs for the symptoms. The treatment provided mild relief.      Review of Systems  Musculoskeletal: Positive for back pain.  Neurological: Positive for weakness and numbness.  All other systems reviewed and are negative.    Observations/Objective: No SOB or distress noted   Assessment and Plan: 1. Chronic right-sided low back pain with right-sided sciatica 1. Chronic right-sided low back pain with right-sided sciatica Rest ROM exercises encouraged Continue PT Keep follow up Pain Clinic - Ambulatory referral to Neurosurgery    I discussed the assessment and treatment plan with the patient. The patient was provided an opportunity to ask questions and all were answered. The patient agreed with the plan and demonstrated an understanding of the instructions.   The patient was advised to call back or seek an in-person evaluation if the symptoms worsen or if the condition fails to improve as anticipated.  The above assessment and management plan was discussed with the patient. The patient verbalized understanding of and has agreed to the management plan. Patient is aware to call the clinic if symptoms persist or worsen. Patient is aware when to return to the clinic for a follow-up visit. Patient educated on when it is appropriate to go to the emergency department.   Time call ended:  3:26 pm   I provided 11 minutes of non-face-to-face time during this encounter.    [name], FNP

## 2020-01-08 ENCOUNTER — Ambulatory Visit (INDEPENDENT_AMBULATORY_CARE_PROVIDER_SITE_OTHER): Payer: Medicare PPO

## 2020-01-08 ENCOUNTER — Other Ambulatory Visit: Payer: Self-pay

## 2020-01-08 ENCOUNTER — Encounter: Payer: Self-pay | Admitting: Family

## 2020-01-08 ENCOUNTER — Ambulatory Visit (INDEPENDENT_AMBULATORY_CARE_PROVIDER_SITE_OTHER): Payer: Medicare PPO | Admitting: Family

## 2020-01-08 VITALS — BP 125/79 | HR 85 | Temp 96.3°F | Ht 68.0 in | Wt 245.8 lb

## 2020-01-08 DIAGNOSIS — F17200 Nicotine dependence, unspecified, uncomplicated: Secondary | ICD-10-CM

## 2020-01-08 DIAGNOSIS — L97519 Non-pressure chronic ulcer of other part of right foot with unspecified severity: Secondary | ICD-10-CM

## 2020-01-08 DIAGNOSIS — E11621 Type 2 diabetes mellitus with foot ulcer: Secondary | ICD-10-CM

## 2020-01-08 MED ORDER — CEPHALEXIN 500 MG PO CAPS: 500.0000 mg | ORAL_CAPSULE | Freq: Three times a day (TID) | ORAL | 0 refills | Status: DC

## 2020-01-08 NOTE — Progress Notes (Signed)
   Subjective:    Patient ID: KYAL ARTS, male    DOB: October 10, 1965, 54 y.o.   MRN: [id number]  Chief Complaint  Patient presents with  . Diabetic Ulcer    right foot x3 weeks     HPI Pt presents to the office today with right great toe pain. He reports the area was red and swollen for the last two weeks. Denies any injury. He reports his BS has been doing good. He reports his pain is a 10 out 10.     Review of Systems  Skin: Positive for wound.  All other systems reviewed and are negative.      Objective:   Physical Exam Vitals reviewed.  Constitutional:      General: He is not in acute distress.    Appearance: He is well-developed.  HENT:     Head: Normocephalic.  Eyes:     General:        Right eye: No discharge.        Left eye: No discharge.     Pupils: Pupils are equal, round, and reactive to light.  Neck:     Thyroid: No thyromegaly.  Cardiovascular:     Rate and Rhythm: Normal rate and regular rhythm.     Heart sounds: Normal heart sounds. No murmur heard.   Pulmonary:     Effort: Pulmonary effort is normal. No respiratory distress.     Breath sounds: Normal breath sounds. No wheezing.  Abdominal:     General: Bowel sounds are normal. There is no distension.     Palpations: Abdomen is soft.     Tenderness: There is no abdominal tenderness.  Musculoskeletal:        General: No tenderness. Normal range of motion.     Cervical back: Normal range of motion and neck supple.  Skin:    General: Skin is warm and dry.     Findings: No erythema or rash.     Comments: Right great toe erythemas, swollen, and tenderness. Wound on posterior to 1X0.5 cm that is 0.4 cm deep  Neurological:     Mental Status: He is alert and oriented to person, place, and time.     Cranial Nerves: No cranial nerve deficit.     Deep Tendon Reflexes: Reflexes are normal and symmetric.  Psychiatric:        Behavior: Behavior normal.        Thought Content: Thought content normal.         Judgment: Judgment normal.            BP 125/79   Pulse 85   Temp (!) 96.3 F (35.7 C) (Temporal)   Ht 5\' 8"  (1.727 m)   Wt 245 lb 12.8 oz (111.5 kg)   SpO2 96%   BMI 37.37 kg/m      Assessment & Plan:  Charles Pennington comes in today with chief complaint of Diabetic Ulcer (right foot x3 weeks )   Diagnosis and orders addressed:  1. Diabetic ulcer of toe of right foot associated with type 2 diabetes mellitus, unspecified ulcer stage (HCC) Referral to wound care pending  Start Keflex today  RTO in 1 week  Report if symptoms worsen or do not improve  - AMB referral to wound care center - DG Toe Great Right; Future  2. Current smoker Smoking cessation discussed    Elbert Ewings, FNP

## 2020-01-09 ENCOUNTER — Other Ambulatory Visit: Payer: Self-pay | Admitting: Family

## 2020-01-13 DIAGNOSIS — E119 Type 2 diabetes mellitus without complications: Secondary | ICD-10-CM | POA: Diagnosis not present

## 2020-01-14 ENCOUNTER — Ambulatory Visit (INDEPENDENT_AMBULATORY_CARE_PROVIDER_SITE_OTHER): Payer: Medicare PPO | Admitting: Family

## 2020-01-14 ENCOUNTER — Encounter: Payer: Self-pay | Admitting: Family

## 2020-01-14 ENCOUNTER — Other Ambulatory Visit: Payer: Self-pay

## 2020-01-14 VITALS — BP 129/79 | HR 95 | Temp 96.7°F | Ht 68.0 in | Wt 241.0 lb

## 2020-01-14 DIAGNOSIS — E11621 Type 2 diabetes mellitus with foot ulcer: Secondary | ICD-10-CM

## 2020-01-14 DIAGNOSIS — L97519 Non-pressure chronic ulcer of other part of right foot with unspecified severity: Secondary | ICD-10-CM

## 2020-01-14 MED ORDER — CEPHALEXIN 500 MG PO CAPS: 500.0000 mg | ORAL_CAPSULE | Freq: Three times a day (TID) | ORAL | 0 refills | Status: DC

## 2020-01-14 NOTE — Progress Notes (Signed)
Subjective:    Patient ID: Charles Pennington, male    DOB: Mar 28, 1965, 54 y.o.   MRN: [id number]  Chief Complaint  Patient presents with  . Diabetic Ulcer    rck    PT presents to the office today to recheck diabetic ucler on right toe. He was seen on 01/08/20 and started on Keflex 500 mg TID for 7 days. He reports his toe is much better. He states his pain is a 2 out 10 that is a throbbing which has improved 10 out 10.  Diabetes He presents for his follow-up diabetic visit. He has type 2 diabetes mellitus. There are no hypoglycemic associated symptoms. Associated symptoms include blurred vision and foot paresthesias. Symptoms are stable. Diabetic complications include a CVA and heart disease. Risk factors for coronary artery disease include dyslipidemia, diabetes mellitus, male sex, hypertension and sedentary lifestyle. He is following a generally unhealthy diet. His overall blood glucose range is 130-140 mg/dl.      Review of Systems  Eyes: Positive for blurred vision.  All other systems reviewed and are negative.      Objective:   Physical Exam Vitals reviewed.  Constitutional:      General: He is not in acute distress.    Appearance: He is well-developed.  HENT:     Head: Normocephalic.     Right Ear: External ear normal.     Left Ear: External ear normal.  Eyes:     General:        Right eye: No discharge.        Left eye: No discharge.     Pupils: Pupils are equal, round, and reactive to light.  Neck:     Thyroid: No thyromegaly.  Cardiovascular:     Rate and Rhythm: Normal rate and regular rhythm.     Heart sounds: Normal heart sounds. No murmur heard.   Pulmonary:     Effort: Pulmonary effort is normal. No respiratory distress.     Breath sounds: Normal breath sounds. No wheezing.  Abdominal:     General: Bowel sounds are normal. There is no distension.     Palpations: Abdomen is soft.     Tenderness: There is no abdominal tenderness.  Musculoskeletal:          General: No tenderness. Normal range of motion.     Cervical back: Normal range of motion and neck supple.  Skin:    General: Skin is warm and dry.     Findings: No erythema or rash.  Neurological:     Mental Status: He is alert and oriented to person, place, and time.     Cranial Nerves: No cranial nerve deficit.     Deep Tendon Reflexes: Reflexes are normal and symmetric.  Psychiatric:        Behavior: Behavior normal.        Thought Content: Thought content normal.        Judgment: Judgment normal.           BP 129/79   Pulse 95   Temp (!) 96.7 F (35.9 C) (Temporal)   Ht 5\' 8"  (1.727 m)   Wt 241 lb (109.3 kg)   SpO2 97%   BMI 36.64 kg/m      Assessment & Plan:  1. Diabetic ulcer of toe of right foot associated with type 2 diabetes mellitus, unspecified ulcer stage (HCC) Area greatly improved, but still erythemas. Will restart Keflex for another week.  Keep clean and dry Report  increase redness, drainage, swelling, or tenderness.  RTO in 1 week.  - cephALEXin (KEFLEX) 500 MG capsule; Take 1 capsule (500 mg total) by mouth 3 (three) times daily.  Dispense: 21 capsule; Refill: 0   [name], FNP

## 2020-01-14 NOTE — Patient Instructions (Signed)
Diabetes Mellitus and Foot Care Foot care is an important part of your health, especially when you have diabetes. Diabetes may cause you to have problems because of poor blood flow (circulation) to your feet and legs, which can cause your skin to:  Become thinner and drier.  Break more easily.  Heal more slowly.  Peel and crack. You may also have nerve damage (neuropathy) in your legs and feet, causing decreased feeling in them. This means that you may not notice minor injuries to your feet that could lead to more serious problems. Noticing and addressing any potential problems early is the best way to prevent future foot problems. How to care for your feet Foot hygiene  Wash your feet daily with warm water and mild soap. Do not use hot water. Then, pat your feet and the areas between your toes until they are completely dry. Do not soak your feet as this can dry your skin.  Trim your toenails straight across. Do not dig under them or around the cuticle. File the edges of your nails with an emery board or nail file.  Apply a moisturizing lotion or petroleum jelly to the skin on your feet and to dry, brittle toenails. Use lotion that does not contain alcohol and is unscented. Do not apply lotion between your toes. Shoes and socks  Wear clean socks or stockings every day. Make sure they are not too tight. Do not wear knee-high stockings since they may decrease blood flow to your legs.  Wear shoes that fit properly and have enough cushioning. Always look in your shoes before you put them on to be sure there are no objects inside.  To break in new shoes, wear them for just a few hours a day. This prevents injuries on your feet. Wounds, scrapes, corns, and calluses  Check your feet daily for blisters, cuts, bruises, sores, and redness. If you cannot see the bottom of your feet, use a mirror or ask someone for help.  Do not cut corns or calluses or try to remove them with medicine.  If you  find a minor scrape, cut, or break in the skin on your feet, keep it and the skin around it clean and dry. You may clean these areas with mild soap and water. Do not clean the area with peroxide, alcohol, or iodine.  If you have a wound, scrape, corn, or callus on your foot, look at it several times a day to make sure it is healing and not infected. Check for: ? Redness, swelling, or pain. ? Fluid or blood. ? Warmth. ? Pus or a bad smell. General instructions  Do not cross your legs. This may decrease blood flow to your feet.  Do not use heating pads or hot water bottles on your feet. They may burn your skin. If you have lost feeling in your feet or legs, you may not know this is happening until it is too late.  Protect your feet from hot and cold by wearing shoes, such as at the beach or on hot pavement.  Schedule a complete foot exam at least once a year (annually) or more often if you have foot problems. If you have foot problems, report any cuts, sores, or bruises to your health care provider immediately. Contact a health care provider if:  You have a medical condition that increases your risk of infection and you have any cuts, sores, or bruises on your feet.  You have an injury that is not   healing.  You have redness on your legs or feet.  You feel burning or tingling in your legs or feet.  You have pain or cramps in your legs and feet.  Your legs or feet are numb.  Your feet always feel cold.  You have pain around a toenail. Get help right away if:  You have a wound, scrape, corn, or callus on your foot and: ? You have pain, swelling, or redness that gets worse. ? You have fluid or blood coming from the wound, scrape, corn, or callus. ? Your wound, scrape, corn, or callus feels warm to the touch. ? You have pus or a bad smell coming from the wound, scrape, corn, or callus. ? You have a fever. ? You have a red line going up your leg. Summary  Check your feet every day  for cuts, sores, red spots, swelling, and blisters.  Moisturize feet and legs daily.  Wear shoes that fit properly and have enough cushioning.  If you have foot problems, report any cuts, sores, or bruises to your health care provider immediately.  Schedule a complete foot exam at least once a year (annually) or more often if you have foot problems. This information is not intended to replace advice given to you by your health care provider. Make sure you discuss any questions you have with your health care provider. Document Revised: 10/25/2018 Document Reviewed: 03/05/2016 Elsevier Patient Education  2020 [employer] Inc[employer]  

## 2020-01-22 ENCOUNTER — Emergency Department (HOSPITAL_COMMUNITY): Payer: Medicare PPO

## 2020-01-22 ENCOUNTER — Other Ambulatory Visit: Payer: Self-pay

## 2020-01-22 ENCOUNTER — Telehealth: Payer: Self-pay

## 2020-01-22 ENCOUNTER — Observation Stay (HOSPITAL_COMMUNITY)
Admission: EM | Admit: 2020-01-22 | Discharge: 2020-01-23 | Disposition: A | Discharge status: Home / Self Care | Payer: Medicare PPO | Attending: Family Medicine | Admitting: Family Medicine

## 2020-01-22 ENCOUNTER — Encounter (HOSPITAL_COMMUNITY): Payer: Self-pay | Admitting: *Deleted

## 2020-01-22 DIAGNOSIS — G8929 Other chronic pain: Secondary | ICD-10-CM | POA: Diagnosis present

## 2020-01-22 DIAGNOSIS — R490 Dysphonia: Secondary | ICD-10-CM | POA: Diagnosis present

## 2020-01-22 DIAGNOSIS — J45909 Unspecified asthma, uncomplicated: Secondary | ICD-10-CM | POA: Insufficient documentation

## 2020-01-22 DIAGNOSIS — M5440 Lumbago with sciatica, unspecified side: Secondary | ICD-10-CM | POA: Diagnosis not present

## 2020-01-22 DIAGNOSIS — K219 Gastro-esophageal reflux disease without esophagitis: Secondary | ICD-10-CM

## 2020-01-22 DIAGNOSIS — E119 Type 2 diabetes mellitus without complications: Secondary | ICD-10-CM | POA: Diagnosis not present

## 2020-01-22 DIAGNOSIS — E118 Type 2 diabetes mellitus with unspecified complications: Secondary | ICD-10-CM

## 2020-01-22 DIAGNOSIS — R059 Cough, unspecified: Secondary | ICD-10-CM | POA: Diagnosis not present

## 2020-01-22 DIAGNOSIS — E785 Hyperlipidemia, unspecified: Secondary | ICD-10-CM

## 2020-01-22 DIAGNOSIS — E039 Hypothyroidism, unspecified: Secondary | ICD-10-CM | POA: Insufficient documentation

## 2020-01-22 DIAGNOSIS — R0602 Shortness of breath: Secondary | ICD-10-CM | POA: Diagnosis not present

## 2020-01-22 DIAGNOSIS — Z79899 Other long term (current) drug therapy: Secondary | ICD-10-CM | POA: Diagnosis not present

## 2020-01-22 DIAGNOSIS — Z794 Long term (current) use of insulin: Secondary | ICD-10-CM | POA: Diagnosis not present

## 2020-01-22 DIAGNOSIS — J449 Chronic obstructive pulmonary disease, unspecified: Secondary | ICD-10-CM | POA: Insufficient documentation

## 2020-01-22 DIAGNOSIS — Z7982 Long term (current) use of aspirin: Secondary | ICD-10-CM | POA: Diagnosis not present

## 2020-01-22 DIAGNOSIS — F17210 Nicotine dependence, cigarettes, uncomplicated: Secondary | ICD-10-CM | POA: Diagnosis not present

## 2020-01-22 DIAGNOSIS — F32A Depression, unspecified: Secondary | ICD-10-CM | POA: Diagnosis present

## 2020-01-22 DIAGNOSIS — J9601 Acute respiratory failure with hypoxia: Secondary | ICD-10-CM | POA: Diagnosis not present

## 2020-01-22 DIAGNOSIS — Z20822 Contact with and (suspected) exposure to covid-19: Secondary | ICD-10-CM | POA: Diagnosis not present

## 2020-01-22 DIAGNOSIS — J441 Chronic obstructive pulmonary disease with (acute) exacerbation: Secondary | ICD-10-CM | POA: Diagnosis present

## 2020-01-22 DIAGNOSIS — G629 Polyneuropathy, unspecified: Secondary | ICD-10-CM

## 2020-01-22 DIAGNOSIS — F1910 Other psychoactive substance abuse, uncomplicated: Secondary | ICD-10-CM | POA: Diagnosis not present

## 2020-01-22 DIAGNOSIS — M5430 Sciatica, unspecified side: Secondary | ICD-10-CM

## 2020-01-22 LAB — ETHANOL: Alcohol, Ethyl (B): <10 mg/dL (ref <10)

## 2020-01-22 LAB — COMPREHENSIVE METABOLIC PANEL
ALT: 33 U/L (ref 0–44)
AST: 59 U/L — ABNORMAL HIGH (ref 15–41)
Albumin: 3.9 g/dL (ref 3.5–5.0)
Alkaline Phosphatase: 53 U/L (ref 38–126)
Anion gap: 10 (ref 5–15)
BUN: 31 mg/dL — ABNORMAL HIGH (ref 6–20)
CO2: 28 mmol/L (ref 22–32)
Calcium: 9.6 mg/dL (ref 8.9–10.3)
Chloride: 102 mmol/L (ref 98–111)
Creatinine, Ser: 1.37 mg/dL — ABNORMAL HIGH (ref 0.61–1.24)
GFR, Estimated: >60 mL/min (ref >60)
Glucose, Bld: 190 mg/dL — ABNORMAL HIGH (ref 70–99)
Potassium: 3.1 mmol/L — ABNORMAL LOW (ref 3.5–5.1)
Sodium: 140 mmol/L (ref 135–145)
Total Bilirubin: 0.5 mg/dL (ref 0.3–1.2)
Total Protein: 6.7 g/dL (ref 6.5–8.1)

## 2020-01-22 LAB — URINALYSIS, ROUTINE W REFLEX MICROSCOPIC
Bacteria, UA: NONE SEEN
Bilirubin Urine: NEGATIVE
Glucose, UA: >=500 mg/dL — AB
Hgb urine dipstick: NEGATIVE
Ketones, ur: NEGATIVE mg/dL
Leukocytes,Ua: NEGATIVE
Nitrite: NEGATIVE
Protein, ur: NEGATIVE mg/dL
Specific Gravity, Urine: 1.031 — ABNORMAL HIGH (ref 1.005–1.030)
pH: 5 (ref 5.0–8.0)

## 2020-01-22 LAB — CBC WITH DIFFERENTIAL/PLATELET
Abs Immature Granulocytes: 0.02 10*3/uL (ref 0.00–0.07)
Basophils Absolute: 0.1 10*3/uL (ref 0.0–0.1)
Basophils Relative: 1 %
Eosinophils Absolute: 0.4 10*3/uL (ref 0.0–0.5)
Eosinophils Relative: 5 %
HCT: 44.1 % (ref 39.0–52.0)
Hemoglobin: 14.7 g/dL (ref 13.0–17.0)
Immature Granulocytes: 0 %
Lymphocytes Relative: 31 %
Lymphs Abs: 2.9 10*3/uL (ref 0.7–4.0)
MCH: 31.8 pg (ref 26.0–34.0)
MCHC: 33.3 g/dL (ref 30.0–36.0)
MCV: 95.5 fL (ref 80.0–100.0)
Monocytes Absolute: 0.6 10*3/uL (ref 0.1–1.0)
Monocytes Relative: 6 %
Neutro Abs: 5.3 10*3/uL (ref 1.7–7.7)
Neutrophils Relative %: 57 %
Platelets: 182 10*3/uL (ref 150–400)
RBC: 4.62 MIL/uL (ref 4.22–5.81)
RDW: 13.9 % (ref 11.5–15.5)
WBC: 9.3 10*3/uL (ref 4.0–10.5)
nRBC: 0 % (ref 0.0–0.2)

## 2020-01-22 LAB — RAPID URINE DRUG SCREEN, HOSP PERFORMED
Amphetamines: POSITIVE — AB
Barbiturates: NOT DETECTED
Benzodiazepines: NOT DETECTED
Cocaine: NOT DETECTED
Opiates: NOT DETECTED
Tetrahydrocannabinol: POSITIVE — AB

## 2020-01-22 LAB — RESP PANEL BY RT-PCR (FLU A&B, COVID) ARPGX2
Influenza A by PCR: NEGATIVE
Influenza B by PCR: NEGATIVE
SARS Coronavirus 2 by RT PCR: NEGATIVE

## 2020-01-22 LAB — GROUP A STREP BY PCR: Group A Strep by PCR: NOT DETECTED

## 2020-01-22 LAB — LACTIC ACID, PLASMA: Lactic Acid, Venous: 1.6 mmol/L (ref 0.5–1.9)

## 2020-01-22 MED ORDER — INSULIN GLARGINE 100 UNIT/ML ~~LOC~~ SOLN
60.0000 [IU] | Freq: Every day | SUBCUTANEOUS | Status: DC
  Filled 2020-01-22 (×2): qty 0.6

## 2020-01-22 MED ORDER — SODIUM CHLORIDE 0.9 % IV SOLN: INTRAVENOUS | Status: DC

## 2020-01-22 MED ORDER — PREGABALIN 75 MG PO CAPS
150.0000 mg | ORAL_CAPSULE | Freq: Two times a day (BID) | ORAL | Status: DC
  Administered 2020-01-22 – 2020-01-23 (×2): 150 mg via ORAL
  Filled 2020-01-22 (×2): qty 2

## 2020-01-22 MED ORDER — INSULIN DEGLUDEC 200 UNIT/ML ~~LOC~~ SOPN: 60.0000 [IU] | PEN_INJECTOR | Freq: Every day | SUBCUTANEOUS | Status: DC

## 2020-01-22 MED ORDER — LISINOPRIL 10 MG PO TABS
20.0000 mg | ORAL_TABLET | Freq: Every day | ORAL | Status: DC
  Administered 2020-01-23: 20 mg via ORAL
  Filled 2020-01-22: qty 2

## 2020-01-22 MED ORDER — IBUPROFEN 800 MG PO TABS: 800.0000 mg | ORAL_TABLET | Freq: Four times a day (QID) | ORAL | Status: DC | PRN

## 2020-01-22 MED ORDER — HYDROCODONE-ACETAMINOPHEN 7.5-325 MG PO TABS
1.0000 | ORAL_TABLET | Freq: Four times a day (QID) | ORAL | Status: DC | PRN
  Administered 2020-01-23: 1 via ORAL
  Filled 2020-01-22: qty 1

## 2020-01-22 MED ORDER — POTASSIUM CHLORIDE CRYS ER 20 MEQ PO TBCR
40.0000 meq | EXTENDED_RELEASE_TABLET | Freq: Once | ORAL | Status: AC
  Administered 2020-01-22: 40 meq via ORAL
  Filled 2020-01-22: qty 2

## 2020-01-22 MED ORDER — IPRATROPIUM BROMIDE 0.02 % IN SOLN
RESPIRATORY_TRACT | Status: AC
  Administered 2020-01-22: 0.5 mg
  Filled 2020-01-22: qty 2.5

## 2020-01-22 MED ORDER — PAROXETINE HCL 20 MG PO TABS
40.0000 mg | ORAL_TABLET | Freq: Every day | ORAL | Status: DC
  Filled 2020-01-22 (×2): qty 2

## 2020-01-22 MED ORDER — ATORVASTATIN CALCIUM 40 MG PO TABS
80.0000 mg | ORAL_TABLET | Freq: Every day | ORAL | Status: DC
  Administered 2020-01-23: 80 mg via ORAL
  Filled 2020-01-22: qty 2

## 2020-01-22 MED ORDER — INSULIN ASPART 100 UNIT/ML ~~LOC~~ SOLN
0.0000 [IU] | Freq: Three times a day (TID) | SUBCUTANEOUS | Status: DC
  Administered 2020-01-23: 1 [IU] via SUBCUTANEOUS

## 2020-01-22 MED ORDER — ASPIRIN EC 81 MG PO TBEC
81.0000 mg | DELAYED_RELEASE_TABLET | Freq: Every day | ORAL | Status: DC
  Administered 2020-01-23: 81 mg via ORAL
  Filled 2020-01-22: qty 1

## 2020-01-22 MED ORDER — ZOLPIDEM TARTRATE 5 MG PO TABS: 10.0000 mg | ORAL_TABLET | Freq: Every evening | ORAL | Status: DC | PRN

## 2020-01-22 MED ORDER — METHYLPREDNISOLONE SODIUM SUCC 125 MG IJ SOLR
125.0000 mg | Freq: Once | INTRAMUSCULAR | Status: AC
  Administered 2020-01-22: 125 mg via INTRAVENOUS
  Filled 2020-01-22: qty 2

## 2020-01-22 MED ORDER — HEPARIN SODIUM (PORCINE) 5000 UNIT/ML IJ SOLN
5000.0000 [IU] | Freq: Three times a day (TID) | INTRAMUSCULAR | Status: DC
  Administered 2020-01-22 – 2020-01-23 (×2): 5000 [IU] via SUBCUTANEOUS
  Filled 2020-01-22 (×2): qty 1

## 2020-01-22 MED ORDER — DOXYCYCLINE HYCLATE 100 MG PO TABS
100.0000 mg | ORAL_TABLET | Freq: Two times a day (BID) | ORAL | Status: DC
  Administered 2020-01-22 – 2020-01-23 (×2): 100 mg via ORAL
  Filled 2020-01-22 (×2): qty 1

## 2020-01-22 MED ORDER — BACLOFEN 10 MG PO TABS: 10.0000 mg | ORAL_TABLET | Freq: Two times a day (BID) | ORAL | Status: DC | PRN

## 2020-01-22 MED ORDER — LEVOTHYROXINE SODIUM 75 MCG PO TABS
175.0000 ug | ORAL_TABLET | Freq: Every day | ORAL | Status: DC
  Administered 2020-01-23: 175 ug via ORAL
  Filled 2020-01-22: qty 1

## 2020-01-22 MED ORDER — ALBUTEROL SULFATE (2.5 MG/3ML) 0.083% IN NEBU
INHALATION_SOLUTION | RESPIRATORY_TRACT | Status: AC
  Administered 2020-01-22: 2.5 mg
  Filled 2020-01-22: qty 3

## 2020-01-22 MED ORDER — SODIUM CHLORIDE 0.9 % IV BOLUS
1000.0000 mL | Freq: Once | INTRAVENOUS | Status: AC
  Administered 2020-01-22: 1000 mL via INTRAVENOUS

## 2020-01-22 MED ORDER — IPRATROPIUM-ALBUTEROL 0.5-2.5 (3) MG/3ML IN SOLN
3.0000 mL | RESPIRATORY_TRACT | Status: DC
  Administered 2020-01-23: 3 mL via RESPIRATORY_TRACT
  Filled 2020-01-22: qty 3

## 2020-01-22 MED ORDER — METHYLPREDNISOLONE SODIUM SUCC 125 MG IJ SOLR
60.0000 mg | Freq: Two times a day (BID) | INTRAMUSCULAR | Status: AC
  Administered 2020-01-22 – 2020-01-23 (×2): 60 mg via INTRAVENOUS
  Filled 2020-01-22 (×2): qty 2

## 2020-01-22 MED ORDER — CEPHALEXIN 500 MG PO CAPS
500.0000 mg | ORAL_CAPSULE | Freq: Three times a day (TID) | ORAL | Status: DC
  Administered 2020-01-22 – 2020-01-23 (×2): 500 mg via ORAL
  Filled 2020-01-22 (×2): qty 1

## 2020-01-22 MED ORDER — POLYVINYL ALCOHOL 1.4 % OP SOLN: 1.0000 [drp] | OPHTHALMIC | Status: DC | PRN

## 2020-01-22 MED ORDER — PANTOPRAZOLE SODIUM 40 MG PO TBEC
80.0000 mg | DELAYED_RELEASE_TABLET | Freq: Every day | ORAL | Status: DC
  Administered 2020-01-22 – 2020-01-23 (×2): 80 mg via ORAL
  Filled 2020-01-22 (×2): qty 2

## 2020-01-22 MED ORDER — PREDNISONE 20 MG PO TABS: 40.0000 mg | ORAL_TABLET | Freq: Every day | ORAL | Status: DC

## 2020-01-22 NOTE — ED Notes (Signed)
Received care of patient sitting on side of bed.  0 s/s acute distress. Call bell in reach.  Urinal provided and urine specimen requested from patient.

## 2020-01-22 NOTE — ED Triage Notes (Signed)
Pt states he smokes and not able to hold a cigarette for the past two days. Pt lives with his boss.

## 2020-01-22 NOTE — Telephone Encounter (Signed)
Pt called stating that he HAS to see Neysa Bonito or somebody TODAY because he is in a lot of pain with his sciatic nerve.  Explained to pt that we are completely booked. Pt wanted me to send Neysa Bonito a message and see if there is anything she can do to help him  Please advise and call patient.

## 2020-01-22 NOTE — Telephone Encounter (Signed)
Patient aware and verbalizes understanding. 

## 2020-01-22 NOTE — ED Triage Notes (Signed)
Pt with aphasia and tingling to right side since 1400 today per pt.  Pt states his sciatic nerve hurts on right side.

## 2020-01-22 NOTE — Progress Notes (Signed)
Rt attempted several times to get a pre peak flow and was ale to get 100 L/min once and after his HHN wasn't able to obtain a post peak flow due the patient being so sleepy and not waking enough to blow into device after several attempts.

## 2020-01-22 NOTE — ED Notes (Signed)
Patient supplemental oxygen weaned down to 02 @ 3L nasal cannula.  Sp02 93-95%.

## 2020-01-22 NOTE — H&P (Signed)
History and Physical    Charles Pennington VZC:[id number] DOB: [birth year] DOA: 01/22/2020  PCP: Charles Balloon, FNP Patient coming from: home  Chief Complaint: Back pain and hoarseness.   HPI: Charles Pennington is a 54 y.o. male with medical history significant of DM, HTN, Hypothyroidism, back pain w/ L4-5 stenosis, CVA, and significant tobacco use history  Limited history able ot be obtained due to pts somnolence. Level 5 caveat applies  History obtained from EDP and Pt.   Pt w/ recent tx for DM foot ulcer on keflex which pt endorses complaince.  At approximately 14:00 pt became hoarse w/o respiratory sx, fever, body aches, CP. No sick contacts. Pt did have several ETOH drinks today but denies drug use. Back pain is his typical lower back pain w/ radiation to the leg.    ED Course: Objective findings as below. Pt found to be hypoxic and treated as COPD exacerbation  Review of Systems: As per HPI otherwise all other systems reviewed and are negative  Ambulatory Status:no restrictions  Past Medical History:  Diagnosis Date  . Anxiety   . Asthma   . Diabetes mellitus   . Diabetes mellitus without complication ([place])   . Hypercholesteremia   . Hypertension   . Hypothyroidism   . Low back pain   . Obesity   . SOB (shortness of breath)   . Stroke [facility]) age 6  . Stroke ([place])   . Vertigo     Past Surgical History:  Procedure Laterality Date  . BACK SURGERY  2013  . COLONOSCOPY  2009   Inflammatory changes of the cecum and ascending colon most consistent with infectious etiology, NSAID, ischemia. Suspected resolving infection based on symptomatology.  [facility] FOOT SURGERY     Dr Irving Shows  . SHOULDER ARTHROSCOPY Right   . SPINE SURGERY    . WRIST SURGERY      Social History   Socioeconomic History  . Marital status: Single    Spouse name: Not on file  . Number of children: 2  . Years of education: Not on file  . Highest education level: 8th grade  Occupational History   . Occupation: disabled Clinical biochemist  Tobacco Use  . Smoking status: Current Every Day Smoker    Packs/day: 0.50    Years: 30.00    Pack years: 15.00    Types: Cigarettes  . Smokeless tobacco: Never Used  Vaping Use  . Vaping Use: Never used  Substance and Sexual Activity  . Alcohol use: Yes  . Drug use: No  . Sexual activity: Yes  Other Topics Concern  . Not on file  Social History Narrative   ** Merged History Encounter **       Social Determinants of Health   Financial Resource Strain:   . Difficulty of Paying Living Expenses: Not on file  Food Insecurity:   . Worried About Charity fundraiser in the Last Year: Not on file  . Ran Out of Food in the Last Year: Not on file  Transportation Needs:   . Lack of Transportation (Medical): Not on file  . Lack of Transportation (Non-Medical): Not on file  Physical Activity:   . Days of Exercise per Week: Not on file  . Minutes of Exercise per Session: Not on file  Stress:   . Feeling of Stress : Not on file  Social Connections:   . Frequency of Communication with Friends and Family: Not on file  . Frequency of Social Gatherings with  Friends and Family: Not on file  . Attends Religious Services: Not on file  . Active Member of Clubs or Organizations: Not on file  . Attends Archivist Meetings: Not on file  . Marital Status: Not on file  Intimate Partner Violence:   . Fear of Current or Ex-Partner: Not on file  . Emotionally Abused: Not on file  . Physically Abused: Not on file  . Sexually Abused: Not on file    Allergies  Allergen Reactions  . Hydrocodone-Acetaminophen Itching and Other (See Comments)    Pt is able to tolerate with Benadryl.   . Gadolinium Derivatives Nausea And Vomiting    Family History  Problem Relation Age of Onset  . Hypertension Father   . Suicidality Father   . Diabetes Father   . Diabetes Mother   . Colon cancer Neg Hx   . Inflammatory bowel disease Neg Hx       Prior to  Admission medications   Medication Sig Start Date End Date Taking? Authorizing Provider  albuterol (PROVENTIL) (2.5 MG/3ML) 0.083% nebulizer solution NEBULIZE 1 VIAL EVERY 6 HOURS AS NEEDED FOR WHEEZING OR SHORTNESS OF BREATH Patient taking differently: Take 2.5 mg by nebulization every 6 (six) hours as needed for wheezing or shortness of breath.  09/07/19  Yes [name], [name], FNP  albuterol (VENTOLIN HFA) 108 (90 Base) MCG/ACT inhaler INHALE 2 PUFFS INTO THE LUNGS EVERY 6 (SIX) HOURS AS NEEDED FOR WHEEZING. 12/03/19  Yes [name], [name], FNP  aspirin EC 81 MG tablet Take 1 tablet (81 mg total) by mouth daily. 06/07/19  Yes [name], Alyse Low A, FNP  atorvastatin (LIPITOR) 80 MG tablet TAKE 1 TABLET DAILY 11/23/19  Yes [name], [name], FNP  baclofen (LIORESAL) 10 MG tablet Take 10 mg by mouth 2 (two) times daily as needed. 08/25/19  Yes [provider]  BELBUCA 450 MCG FILM Take 1 Film by mouth in the morning and at bedtime.  03/03/18  Yes [provider]  dapagliflozin propanediol (FARXIGA) 10 MG TABS tablet Take 10 mg by mouth daily. 06/07/19  Yes [name], [name], FNP  Dulaglutide (TRULICITY) 3 GB/1.5VV SOPN Inject 3 mg as directed once a week. Discontinue 1.35m sq week dose Patient taking differently: Inject 1 mg as directed once a week. Discontinue 1.535msq week dose 11/06/19  Yes [name], [name], FNP  HYDROcodone-acetaminophen (NORCO) 7.5-325 MG tablet Take 1 tablet by mouth 3 (three) times daily as needed. 12/24/19  Yes [provider]  hydroxypropyl methylcellulose / hypromellose (ISOPTO TEARS / GONIOVISC) 2.5 % ophthalmic solution Place 1 drop into both eyes as needed for dry eyes.   Yes [provider]  ibuprofen (ADVIL,MOTRIN) 800 MG tablet Take 800 mg by mouth in the morning and at bedtime.  02/28/18  Yes [provider]  insulin degludec (TRESIBA FLEXTOUCH) 200 UNIT/ML FlexTouch Pen Inject 60-120 Units into the skin daily. MAX daily dose of 120  units Patient taking differently: Inject 60 Units into the skin in the morning and at bedtime. Inject 60-120 Units into the skin daily. MAX daily dose of 120 units 11/06/19  Yes [name], [place], FNP  levothyroxine (SYNTHROID) 175 MCG tablet TAKE (1) TABLET DAILY BEFORE BREAKFAST. (needs labwork) 12/03/19  Yes [name], [name], FNP  lisinopril (ZESTRIL) 20 MG tablet Take 1 tablet (20 mg total) by mouth daily. 06/07/19  Yes [name], [name], FNP  metFORMIN (GLUCOPHAGE-XR) 750 MG 24 hr tablet TAKE 2 TABLET WITH MORNING MEAL 07/03/19  Yes [name], [name], FNP  omeprazole (PRILOSEC) 20 MG capsule TAKE (1) CAPSULE DAILY Patient taking differently: Take 20 mg by mouth daily.  12/24/19  Yes [name], [name], FNP  PARoxetine (PAXIL) 40 MG tablet Take 1 tablet (40 mg total) by mouth daily. 06/07/19  Yes [name], [name], FNP  pregabalin (LYRICA) 150 MG capsule TAKE 2 CAPSULES EVERY MORNING AND 2 CAPSULES EVERY EVENING 10/05/19  Yes [name], [name], FNP  TRELEGY ELLIPTA 100-62.5-25 MCG/INH AEPB INHALE 1 ONCE A DAY AS DIRECTED Patient taking differently: Inhale 1 puff into the lungs daily as needed.  12/24/19  Yes [name], [name], FNP  zolpidem (AMBIEN) 10 MG tablet Take 1 tablet (10 mg total) by mouth at bedtime. 10/05/19  Yes [name], Theador [place], FNP  blood glucose meter kit and supplies Dispense based on patient and insurance preference. Test sugar BID and as needed 09/02/16   Evelina Dun A, FNP  cephALEXin (KEFLEX) 500 MG capsule Take 1 capsule (500 mg total) by mouth 3 (three) times daily. Patient not taking: Reported on 01/22/2020 01/14/20   Charles Balloon, FNP  CONTOUR NEXT TEST test strip Check blood sugar twice daily Dx E11.9 07/03/19   Evelina Dun A, FNP  Insulin Pen Needle (ULTICARE MICRO PEN NEEDLES) 32G X 4 MM MISC Use twice daily Dx E11.40 09/07/19   Charles Balloon, FNP    Physical Exam: Vitals:   01/22/20 1949 01/22/20 2000 01/22/20 2002 01/22/20 2030  BP:  (!) 160/124  (!) 168/143  Pulse:   91  88  Resp:  (!) 21  15  Temp:      TempSrc:      SpO2: 91% 91% 93% 91%  Weight:      Height:         General:  Appears calm and comfortable Eyes: Slcera injected.  PERRL, EOMI, normal lids, ENT:  grossly normal hearing, lips & tongue, mmm Neck:  no LAD, masses or thyromegaly Cardiovascular:  RRR, no m/r/g.  Respiratory: Coarse breath sounds throughout w/ wheezing and increased effort. On 4L [place] at 95%.  Abdomen:  soft, ntnd, NABS Skin: Ulceration of great toe as pictured in EDP note.  no rash or induration seen on limited exam Musculoskeletal:  grossly normal tone BUE/BLE, good ROM, no bony abnormality Psychiatric: Somnolent and difficult to arounse. Follows commands appropriately when awake but falls asleep quickly.  Neurologic:  CN 2-12 grossly intact, moves all extremities in coordinated fashion, sensation intact  Labs on Admission: I have personally reviewed following labs and imaging studies  CBC: Recent Labs  Lab 01/22/20 1737  WBC 9.3  NEUTROABS 5.3  HGB 14.7  HCT 44.1  MCV 95.5  PLT 956   Basic Metabolic Panel: Recent Labs  Lab 01/22/20 1737  NA 140  K 3.1*  CL 102  CO2 28  GLUCOSE 190*  BUN 31*  CREATININE 1.37*  CALCIUM 9.6   GFR: Estimated Creatinine Clearance: 74.2 mL/min (A) (by C-G formula based on SCr of 1.37 mg/dL (H)). Liver Function Tests: Recent Labs  Lab 01/22/20 1737  AST 59*  ALT 33  ALKPHOS 53  BILITOT 0.5  PROT 6.7  ALBUMIN 3.9   No results for input(s): LIPASE, AMYLASE in the last 168 hours. No results for input(s): AMMONIA in the last 168 hours. Coagulation Profile: No results for input(s): INR, PROTIME in the last 168 hours. Cardiac Enzymes: No results for input(s): CKTOTAL, CKMB, CKMBINDEX, TROPONINI in the last 168 hours. BNP (last 3 results)  No results for input(s): PROBNP in the last 8760 hours. HbA1C: No results for input(s): HGBA1C in the last 72 hours. CBG: No results for input(s): GLUCAP in the last 168  hours. Lipid Profile: No results for input(s): CHOL, HDL, LDLCALC, TRIG, CHOLHDL, LDLDIRECT in the last 72 hours. Thyroid Function Tests: No results for input(s): TSH, T4TOTAL, FREET4, T3FREE, THYROIDAB in the last 72 hours. Anemia Panel: No results for input(s): VITAMINB12, FOLATE, FERRITIN, TIBC, IRON, RETICCTPCT in the last 72 hours. Urine analysis:    Component Value Date/Time   COLORURINE YELLOW 01/22/2020 1955   APPEARANCEUR CLEAR 01/22/2020 1955   LABSPEC 1.031 (H) 01/22/2020 1955   PHURINE 5.0 01/22/2020 1955   GLUCOSEU >=500 (A) 01/22/2020 1955   HGBUR NEGATIVE 01/22/2020 1955   BILIRUBINUR NEGATIVE 01/22/2020 1955   KETONESUR NEGATIVE 01/22/2020 1955   PROTEINUR NEGATIVE 01/22/2020 1955   UROBILINOGEN 0.2 04/16/2014 1134   NITRITE NEGATIVE 01/22/2020 1955   LEUKOCYTESUR NEGATIVE 01/22/2020 1955    Creatinine Clearance: Estimated Creatinine Clearance: 74.2 mL/min (A) (by C-G formula based on SCr of 1.37 mg/dL (H)).  Sepsis Labs: @LABRCNTIP (procalcitonin:4,lacticidven:4) ) Recent Results (from the past 240 hour(s))  Resp Panel by RT-PCR (Flu A&B, Covid) Nasopharyngeal Swab     Status: None   Collection Time: 01/22/20  5:42 PM   Specimen: Nasopharyngeal Swab; Nasopharyngeal(NP) swabs in vial transport medium  Result Value Ref Range Status   SARS Coronavirus 2 by RT PCR NEGATIVE NEGATIVE Final    Comment: (NOTE) SARS-CoV-2 target nucleic acids are NOT DETECTED.  The SARS-CoV-2 RNA is generally detectable in upper respiratory specimens during the acute phase of infection. The lowest concentration of SARS-CoV-2 viral copies this assay can detect is 138 copies/mL. A negative result does not preclude SARS-Cov-2 infection and should not be used as the sole basis for treatment or other patient management decisions. A negative result may occur with  improper specimen collection/handling, submission of specimen other than nasopharyngeal swab, presence of viral mutation(s)  within the areas targeted by this assay, and inadequate number of viral copies(<138 copies/mL). A negative result must be combined with clinical observations, patient history, and epidemiological information. The expected result is Negative.  Fact Sheet for Patients:  EntrepreneurPulse.com.au  Fact Sheet for Healthcare Providers:  IncredibleEmployment.be  This test is no t yet approved or cleared by the [place] FDA and  has been authorized for detection and/or diagnosis of SARS-CoV-2 by FDA under an Emergency Use Authorization (EUA). This EUA will remain  in effect (meaning this test can be used) for the duration of the COVID-19 declaration under Section 564(b)(1) of the Act, 21 U.S.C.section 360bbb-3(b)(1), unless the authorization is terminated  or revoked sooner.       Influenza A by PCR NEGATIVE NEGATIVE Final   Influenza B by PCR NEGATIVE NEGATIVE Final    Comment: (NOTE) The Xpert Xpress SARS-CoV-2/FLU/RSV plus assay is intended as an aid in the diagnosis of influenza from Nasopharyngeal swab specimens and should not be used as a sole basis for treatment. Nasal washings and aspirates are unacceptable for Xpert Xpress SARS-CoV-2/FLU/RSV testing.  Fact Sheet for Patients: EntrepreneurPulse.com.au  Fact Sheet for Healthcare Providers: IncredibleEmployment.be  This test is not yet approved or cleared by the [place] FDA and has been authorized for detection and/or diagnosis of SARS-CoV-2 by FDA under an Emergency Use Authorization (EUA). This EUA will remain in effect (meaning this test can be used) for the duration of the COVID-19 declaration under Section 564(b)(1) of the Act, 21  U.S.C. section 360bbb-3(b)(1), unless the authorization is terminated or revoked.  Performed at [facility], [address], [place], [place] [address]   Group A Strep by PCR     Status: None   Collection  Time: 01/22/20  6:22 PM   Specimen: Throat; Sterile Swab  Result Value Ref Range Status   Group A Strep by PCR NOT DETECTED NOT DETECTED Final    Comment: Performed at [facility], [address] [place], [place] [address]     Radiological Exams on Admission: DG Chest Port 1 View  Result Date: 01/22/2020 CLINICAL DATA:  Cough. EXAM: PORTABLE CHEST 1 VIEW COMPARISON:  December 19, 2017 FINDINGS: Very mildly increased bronchovascular lung markings are seen without evidence of acute infiltrate, pleural effusion or pneumothorax. The heart size and mediastinal contours are within normal limits. Multiple chronic left-sided rib fractures are seen. IMPRESSION: Very mildly increased bronchovascular lung markings without evidence of acute infiltrate. Electronically Signed   By: Virgina Norfolk M.D.   On: 01/22/2020 18:38    EKG: Independently reviewed. No ACS. Sinus.   Assessment/Plan Principal Problem:   Acute respiratory failure with hypoxia (HCC) Active Problems:   Type 2 diabetes mellitus treated with insulin (HCC)   GERD (gastroesophageal reflux disease)   Depression   Chronic back pain   Peripheral neuropathy   Diabetes mellitus with complication (HCC)   Substance abuse (HCC)   Sciatica   HLD (hyperlipidemia)    Acute hypoxic respiratory failure: Secondary to somnloence and likely COPD exacerbation. Pt w/o formal dx of COPD but w/ chronic multipack tobacco use daily. COVID negative.  - Solumedrol Q12.  - Duoneb Q4 - Doxy - O2 PRN - awaiting effects of ETOH and THC to wear off  Substance abuse: ETOH, Amphetamine, and THC in system on arrival. Pt very somnolent.  - SW consult for cessation counseling.  - Pt on Belbuca outpt - awaiting to restart until pt is awake.   Peripheral Neurpoathy and Sciatica w/ L4-5 spinal stenosis: at baseline.  - continue baclofen and pain regimen - Continu eLyrica on 03/26/19  AKI: Cr 1.39. Baseline 1.0. likely from dehydration from poor fluid  intake and ETOH use.  - IVF.  BMP in am.   DM foot ulcer: currently on Keflex as outpt. Likely to heal slowly - continue keflex.   GERD: - continue PPI  Depressionl; - continue Paxil  Hypothyroid: - continue Synthroid  Insomnia: - continue Ambien on 03/25/20.   DM:  - SSI - Lantus  HLD: - continue statin   DVT prophylaxis: Lovenox  Code Status: full  Family Communication: none  Disposition Plan: [facility] [place].  Consults called: none  Admission status: inpt    Waldemar Dickens MD [facility]  If 7PM-7AM, please contact night-coverage [email or web address] Password [facility]  01/22/2020, 9:13 PM

## 2020-01-22 NOTE — ED Triage Notes (Signed)
Pt states he just got home from [place] from getting drunk.

## 2020-01-22 NOTE — Telephone Encounter (Signed)
I am sorry, but do not have any openings today. You can try to book with someone this week, if not he may need to go to Urgent care. Can we check on his neurosurgeon appt?

## 2020-01-22 NOTE — ED Notes (Signed)
RT notified of peak flows needed

## 2020-01-22 NOTE — ED Provider Notes (Signed)
  Face-to-face evaluation   History: Patient states he came here because of right-sided sciatica.  He apparently was out drinking last night and is also concerned about hoarseness.  Physical exam: Patient sleeping, required sternal rub to arouse and then was communicative and cooperative.  He was able to sit up and manage himself in that position without falling.  Lungs have fair air movement with scattered rhonchi and wheezes.  No respiratory distress.  Right great toe with superficial ulceration, without associated bleeding, drainage or significant tenderness.  Medical screening examination/treatment/procedure(s) were conducted as a shared visit with non-physician practitioner(s) and myself.  I personally evaluated the patient during the encounter   Mancel Bale, MD 01/26/20 2019

## 2020-01-22 NOTE — ED Provider Notes (Signed)
[facility] EMERGENCY DEPARTMENT Provider Note   CSN: [id number] Arrival date & time: 01/22/20  1633     History Chief Complaint  Patient presents with  . Aphasia    Charles Penning[place] is a 54 y.o. male.  HPI   Patient with significant medical history of anxiety, asthma, diabetes, hypertension, stroke, presents to the emergency department with chief complaint of losing his voice and right lower back pain.  Patient states right lower back pain is chronic and has sciatic nerve pain which radiates down into his right leg. He has been seen by his primary care provider and had MRIs performed showing stenosis at L5 and L4, currently in physical therapy.  He endorses that he has worsening pain  over the last 4 days denies any recent traumas to the area, denies paresthesias or weakness in his lower extremities, no difficulty with urination, denies IV drug use, denies fevers, chills, autoimmune diseases, recent tick bites.  He states he has been taking his Vicodin but he is all out of it.  He has a diabetic foot ulcer on his right big toe he has been on 2 weeks of Keflex, endorses he still has pain in the area and the foot is red.  Patient also endorses that he lost his voice around 2 PM today, denies nasal congestion, sore throat but endorses a productive cough.  He denies fevers, chills, chest pain, shortness of breath, abdominal pain, general body aches, he is not Covid vaccinated, denies recent sick contacts.  He does endorse that he was with his friend yesterday where he drank a few beers and smokes cigarettes daily.  He denies any alleviating factors.  Patient denies headaches, fevers, chills, shortness of breath, chest pain, abdominal pain, nausea, vomiting, diarrhea, pedal edema.  Past Medical History:  Diagnosis Date  . Anxiety   . Asthma   . Diabetes mellitus   . Diabetes mellitus without complication ([place])   . Hypercholesteremia   . Hypertension   . Hypothyroidism   . Low back pain    . Obesity   . SOB (shortness of breath)   . Stroke [facility]) age 9  . Stroke ([place])   . Vertigo     Patient Active Problem List   Diagnosis Date Noted  . Peripheral neuropathy 01/22/2020  . Diabetes mellitus with complication ([place]) 12/45/8099  . Substance abuse ([place]) 01/22/2020  . Sciatica 01/22/2020  . HLD (hyperlipidemia) 01/22/2020  . Acute respiratory failure with hypoxia ([place]) 01/22/2020  . Noncompliance with medications 01/21/2017  . Acute bronchitis with COPD ([place]) 11/05/2016  . Duodenal ulcer 07/23/2016  . Multiple lacerations 04/04/2016  . Cocaine abuse ([place]) 12/12/2015  . Pain medication agreement broken 12/12/2015  . Insomnia 10/28/2015  . Pain medication agreement signed 07/23/2015  . Opioid dependence ([place]) 07/23/2015  . Morbid obesity ([place]) 06/26/2015  . Chronic back pain 06/23/2015  . Rupture of ulnar collateral ligament of thumb 03/01/2014  . Memory difficulties 08/15/2013  . Diabetic neuropathy associated with type 2 diabetes mellitus ([place]) 08/15/2013  . GERD (gastroesophageal reflux disease) 07/19/2013  . GAD (generalized anxiety disorder) 07/19/2013  . Depression 07/19/2013  . 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 Postlaminectomy syndrome, lumbar region 07/05/2011  . Hyperlipidemia associated with type 2 diabetes mellitus ([place]) 12/30/2009  . SMOKER 12/30/2009  . Colitis, acute 05/27/2008  . Hypothyroidism 05/23/2008  . Type 2 diabetes mellitus treated with insulin ([place]) 05/23/2008  . Hypertension associated with diabetes ([place]) 05/23/2008  . History of  CVA (cerebrovascular accident) 05/23/2008  . Asthma 05/23/2008  . FATTY LIVER DISEASE 05/23/2008  . ABDOMINAL PAIN 05/23/2008    Past Surgical History:  Procedure Laterality Date  . BACK SURGERY  2013  . COLONOSCOPY  2009   Inflammatory changes of the cecum and ascending colon most consistent with infectious etiology, NSAID, ischemia. Suspected resolving infection based on  symptomatology.  [facility] Kitchen FOOT SURGERY     Dr Irving Shows  . SHOULDER ARTHROSCOPY Right   . SPINE SURGERY    . WRIST SURGERY         Family History  Problem Relation Age of Onset  . Hypertension Father   . Suicidality Father   . Diabetes Father   . Diabetes Mother   . Colon cancer Neg Hx   . Inflammatory bowel disease Neg Hx     Social History   Tobacco Use  . Smoking status: Current Every Day Smoker    Packs/day: 0.50    Years: 30.00    Pack years: 15.00    Types: Cigarettes  . Smokeless tobacco: Never Used  Vaping Use  . Vaping Use: Never used  Substance Use Topics  . Alcohol use: Yes  . Drug use: No    Home Medications Prior to Admission medications   Medication Sig Start Date End Date Taking? Authorizing Provider  albuterol (PROVENTIL) (2.5 MG/3ML) 0.083% nebulizer solution NEBULIZE 1 VIAL EVERY 6 HOURS AS NEEDED FOR WHEEZING OR SHORTNESS OF BREATH Patient taking differently: Take 2.5 mg by nebulization every 6 (six) hours as needed for wheezing or shortness of breath.  09/07/19  Yes [name], [name], FNP  albuterol (VENTOLIN HFA) 108 (90 Base) MCG/ACT inhaler INHALE 2 PUFFS INTO THE LUNGS EVERY 6 (SIX) HOURS AS NEEDED FOR WHEEZING. 12/03/19  Yes [name], [name], FNP  aspirin EC 81 MG tablet Take 1 tablet (81 mg total) by mouth daily. 06/07/19  Yes [name], Alyse Low A, FNP  atorvastatin (LIPITOR) 80 MG tablet TAKE 1 TABLET DAILY 11/23/19  Yes [name], [name], FNP  baclofen (LIORESAL) 10 MG tablet Take 10 mg by mouth 2 (two) times daily as needed. 08/25/19  Yes [provider]  BELBUCA 450 MCG FILM Take 1 Film by mouth in the morning and at bedtime.  03/03/18  Yes [provider]  dapagliflozin propanediol (FARXIGA) 10 MG TABS tablet Take 10 mg by mouth daily. 06/07/19  Yes [name], [name], FNP  Dulaglutide (TRULICITY) 3 DU/2.0UR SOPN Inject 3 mg as directed once a week. Discontinue 1.66m sq week dose Patient taking differently: Inject 1 mg as directed once a week.  Discontinue 1.574msq week dose 11/06/19  Yes [name], [name], FNP  HYDROcodone-acetaminophen (NORCO) 7.5-325 MG tablet Take 1 tablet by mouth 3 (three) times daily as needed. 12/24/19  Yes [provider]  hydroxypropyl methylcellulose / hypromellose (ISOPTO TEARS / GONIOVISC) 2.5 % ophthalmic solution Place 1 drop into both eyes as needed for dry eyes.   Yes [provider]  ibuprofen (ADVIL,MOTRIN) 800 MG tablet Take 800 mg by mouth in the morning and at bedtime.  02/28/18  Yes [provider]  insulin degludec (TRESIBA FLEXTOUCH) 200 UNIT/ML FlexTouch Pen Inject 60-120 Units into the skin daily. MAX daily dose of 120 units Patient taking differently: Inject 60 Units into the skin in the morning and at bedtime. Inject 60-120 Units into the skin daily. MAX daily dose of 120 units 11/06/19  Yes [name], [place], FNP  levothyroxine (SYNTHROID) 175 MCG tablet TAKE (1) TABLET  DAILY BEFORE BREAKFAST. (needs labwork) 12/03/19  Yes [name], [name], FNP  lisinopril (ZESTRIL) 20 MG tablet Take 1 tablet (20 mg total) by mouth daily. 06/07/19  Yes [name], [name], FNP  metFORMIN (GLUCOPHAGE-XR) 750 MG 24 hr tablet TAKE 2 TABLET WITH MORNING MEAL 07/03/19  Yes [name], [name], FNP  omeprazole (PRILOSEC) 20 MG capsule TAKE (1) CAPSULE DAILY Patient taking differently: Take 20 mg by mouth daily.  12/24/19  Yes [name], [name], FNP  PARoxetine (PAXIL) 40 MG tablet Take 1 tablet (40 mg total) by mouth daily. 06/07/19  Yes [name], [name], FNP  pregabalin (LYRICA) 150 MG capsule TAKE 2 CAPSULES EVERY MORNING AND 2 CAPSULES EVERY EVENING 10/05/19  Yes [name], [name], FNP  TRELEGY ELLIPTA 100-62.5-25 MCG/INH AEPB INHALE 1 ONCE A DAY AS DIRECTED Patient taking differently: Inhale 1 puff into the lungs daily as needed.  12/24/19  Yes [name], [name], FNP  zolpidem (AMBIEN) 10 MG tablet Take 1 tablet (10 mg total) by mouth at bedtime. 10/05/19  Yes [name], Theador Hawthorne, FNP  blood glucose meter kit  and supplies Dispense based on patient and insurance preference. Test sugar BID and as needed 09/02/16   Evelina Dun A, FNP  cephALEXin (KEFLEX) 500 MG capsule Take 1 capsule (500 mg total) by mouth 3 (three) times daily. Patient not taking: Reported on 01/22/2020 01/14/20   Sharion Balloon, FNP  CONTOUR NEXT TEST test strip Check blood sugar twice daily Dx E11.9 07/03/19   Evelina Dun A, FNP  Insulin Pen Needle (ULTICARE MICRO PEN NEEDLES) 32G X 4 MM MISC Use twice daily Dx E11.40 09/07/19   Evelina Dun A, FNP    Allergies    Hydrocodone-acetaminophen and Gadolinium derivatives  Review of Systems   Review of Systems  Constitutional: Negative for chills and fever.  HENT: Positive for voice change. Negative for congestion and sore throat.   Respiratory: Positive for cough. Negative for shortness of breath.   Cardiovascular: Negative for chest pain.  Gastrointestinal: Negative for abdominal pain.  Genitourinary: Negative for dysuria, enuresis and flank pain.  Musculoskeletal: Positive for back pain.  Skin: Positive for wound. Negative for rash.  Neurological: Negative for headaches.  Hematological: Does not bruise/bleed easily.    Physical Exam Updated Vital Signs BP (!) 168/143   Pulse 88   Temp 97.7 F (36.5 C) (Oral)   Resp 15   Ht 5' 7"  (1.702 m)   Wt 111.1 kg   SpO2 91%   BMI 38.37 kg/m   Physical Exam Vitals and nursing note reviewed.  Constitutional:      General: He is not in acute distress.    Appearance: He is not ill-appearing.  HENT:     Head: Normocephalic and atraumatic.     Nose: No congestion.     Mouth/Throat:     Mouth: Mucous membranes are dry.     Pharynx: Oropharynx is clear. No oropharyngeal exudate or posterior oropharyngeal erythema.  Eyes:     Conjunctiva/sclera: Conjunctivae normal.  Cardiovascular:     Rate and Rhythm: Normal rate and regular rhythm.     Pulses: Normal pulses.     Heart sounds: No murmur heard.  No friction rub. No  gallop.   Pulmonary:     Effort: No respiratory distress.     Breath sounds: Rales present. No wheezing or rhonchi.     Comments: Patient is not in respiratory distress on my exam, he had good rise and fall during respirations,  he had bilateral wheezing in the lower lobes, with noted rales left lower lobe. Abdominal:     Palpations: Abdomen is soft.     Tenderness: There is no abdominal tenderness. There is no right CVA tenderness or left CVA tenderness.  Musculoskeletal:     Right lower leg: No edema.     Left lower leg: No edema.     Comments: Patient is moving all four extremities at difficulty.  Skin:    General: Skin is warm and dry.     Findings: Lesion and rash present.     Comments: Foot exam was performed he has a noted ulcer on his right big toe on the plantar aspect.  It measures 1 cm in diameter, about 1 cm in depth, some purulent discharge noted, erythema noted around, tenderness to palpation.  No other lesions noted.   Neurological:     Mental Status: He is alert.     Comments: Patient had no difficulty with word finding.   Psychiatric:        Mood and Affect: Mood normal.         ED Results / Procedures / Treatments   Labs (all labs ordered are listed, but only abnormal results are displayed) Labs Reviewed  COMPREHENSIVE METABOLIC PANEL - Abnormal; Notable for the following components:      Result Value   Potassium 3.1 (*)    Glucose, Bld 190 (*)    BUN 31 (*)    Creatinine, Ser 1.37 (*)    AST 59 (*)    All other components within normal limits  URINALYSIS, ROUTINE W REFLEX MICROSCOPIC - Abnormal; Notable for the following components:   Specific Gravity, Urine 1.031 (*)    Glucose, UA >=500 (*)    All other components within normal limits  RAPID URINE DRUG SCREEN, HOSP PERFORMED - Abnormal; Notable for the following components:   Amphetamines POSITIVE (*)    Tetrahydrocannabinol POSITIVE (*)    All other components within normal limits  RESP PANEL BY  RT-PCR (FLU A&B, COVID) ARPGX2  GROUP A STREP BY PCR  ETHANOL  CBC WITH DIFFERENTIAL/PLATELET  LACTIC ACID, PLASMA    EKG EKG Interpretation  Date/Time:  Tuesday January 22 2020 17:54:46 EST Ventricular Rate:  78 PR Interval:    QRS Duration: 108 QT Interval:  403 QTC Calculation: 459 R Axis:   56 Text Interpretation: Sinus rhythm Low voltage, precordial leads since last tracing no significant change Confirmed by Daleen Bo [phone]) on 01/22/2020 5:58:37 PM   Radiology DG Chest Port 1 View  Result Date: 01/22/2020 CLINICAL DATA:  Cough. EXAM: PORTABLE CHEST 1 VIEW COMPARISON:  December 19, 2017 FINDINGS: Very mildly increased bronchovascular lung markings are seen without evidence of acute infiltrate, pleural effusion or pneumothorax. The heart size and mediastinal contours are within normal limits. Multiple chronic left-sided rib fractures are seen. IMPRESSION: Very mildly increased bronchovascular lung markings without evidence of acute infiltrate. Electronically Signed   By: Virgina Norfolk M.D.   On: 01/22/2020 18:38    Procedures .Critical Care Performed by: Marcello Fennel, PA-C Authorized by: Marcello Fennel, PA-C   Critical care provider statement:    Critical care time (minutes):  45   Critical care time was exclusive of:  Separately billable procedures and treating other patients   Critical care was necessary to treat or prevent imminent or life-threatening deterioration of the following conditions:  Respiratory failure   Critical care was time spent personally by me on the  following activities:  Discussions with consultants, evaluation of patient's response to treatment, examination of patient, ordering and performing treatments and interventions, ordering and review of laboratory studies, ordering and review of radiographic studies, pulse oximetry, re-evaluation of patient's condition and review of old charts   I assumed direction of critical care for this  patient from another provider in my specialty: no     (including critical care time)  Medications Ordered in ED Medications  aspirin EC tablet 81 mg (has no administration in time range)  baclofen (LIORESAL) tablet 10 mg (has no administration in time range)  HYDROcodone-acetaminophen (NORCO) 7.5-325 MG per tablet 1 tablet (has no administration in time range)  polyvinyl alcohol (LIQUIFILM TEARS) 1.4 % ophthalmic solution 1 drop (has no administration in time range)  levothyroxine (SYNTHROID) tablet 175 mcg (has no administration in time range)  lisinopril (ZESTRIL) tablet 20 mg (has no administration in time range)  pantoprazole (PROTONIX) EC tablet 80 mg (has no administration in time range)  PARoxetine (PAXIL) tablet 40 mg (has no administration in time range)  pregabalin (LYRICA) capsule 150 mg (has no administration in time range)  zolpidem (AMBIEN) tablet 10 mg (has no administration in time range)  atorvastatin (LIPITOR) tablet 80 mg (has no administration in time range)  insulin glargine (LANTUS) injection 60 Units (has no administration in time range)  cephALEXin (KEFLEX) capsule 500 mg (has no administration in time range)  methylPREDNISolone sodium succinate (SOLU-MEDROL) 125 mg/2 mL injection 125 mg (125 mg Intravenous Given 01/22/20 1744)  sodium chloride 0.9 % bolus 1,000 mL (1,000 mLs Intravenous New Bag/Given 01/22/20 1747)  ipratropium (ATROVENT) 0.02 % nebulizer solution (0.5 mg  Given 01/22/20 2001)  albuterol (PROVENTIL) (2.5 MG/3ML) 0.083% nebulizer solution (2.5 mg  Given 01/22/20 2001)  potassium chloride SA (KLOR-CON) CR tablet 40 mEq (40 mEq Oral Given 01/22/20 1925)    ED Course  I have reviewed the triage vital signs and the nursing notes.  Pertinent labs & imaging results that were available during my care of the patient were reviewed by me and considered in my medical decision making (see chart for details).    MDM Rules/Calculators/A&P                           Patient presents with right lower back pain as well as losing his voice.  He is alert, does not appear in acute distress, vital signs significant for tachycardia.  Will obtain basic lab work, due to bilateral wheezing heard with a positive smoke history concern for possible COPD exacerbation will start patient on steroids, breathing treatment and fluids.  Will reevaluate.  Nurse notified me that patient's O2 sats dipped down to 87% and was placed on 5 L via nasal cannula.  Patient was reevaluated satting at 95% room air, nontachypneic, sleeping at this time.  Patient is reevaluated lung sounds have improved with nebulizing treatment as well as steroids.  He still has noted left lower rales.  Wheezing has resolved, has improved airflow.  Patient was taken off of oxygen O2 sats dipped down into the upper 80s.  Due to new respiratory requirements will consult with hospitalist for admission for acute COPD exacerbation.  Spoke with Dr. Barbaraann Faster of the hospitalist team he agrees patient should be admitted for further observation.  He will come down and assess the patient.  CBC negative for leukocytosis or signs of anemia.  CMP shows slight hypokalemia 3.1, no metabolic acidosis, hyperglycemia of 190,  elevated creatinine of 137, slight elevation in AST 59, no anion gap present.  Ethanol less than 10.  Lactic acid 1.6.  Respiratory panel negative for Covid, influenza A/B.  Strep test negative.  UA negative for nitrates or leukocytes.  Rapid drug screen was positive for amphetamines and cannabinoids.   Low suspicion for systemic infection as patient is nontoxic-appearing, vital signs reassuring, no leukocytosis noted on my exam.  Low suspicion for worsening diabetic ulcer, will defer imaging at this time.  Low suspicion for ACS or cardiac abnormality as patient has chest pain, shortness of breath, no signs of hypoperfusion fluid overload on my exam.  Low suspicion for PE as patient has little risk factors, no  history of DVTs or PEs, is not on testosterone, vital signs reassuring.  Low suspicion for intra-abdominal abnormality requiring immediate intervention as patient's abdomen soft nontender to palpation no acute abdomen on my exam.  Suspect patient's somnolence is secondary to drug use as he has positive amphetamines and cannabinoids rapid drug screen.  Differential diagnosis for possible shortness of breath include COPD exacerbation versus polysubstance abuse versus viral URI.  Patient care will be transferred to admitting team.  Final Clinical Impression(s) / ED Diagnoses Final diagnoses:  Acute respiratory failure with hypoxia [facility])    Rx / DC Orders ED Discharge Orders    None       Aron Baba 01/22/20 2120    Daleen Bo, MD 01/26/20 2019

## 2020-01-23 ENCOUNTER — Encounter (HOSPITAL_COMMUNITY): Payer: Self-pay | Admitting: Family Medicine

## 2020-01-23 ENCOUNTER — Telehealth: Payer: Self-pay

## 2020-01-23 DIAGNOSIS — F1910 Other psychoactive substance abuse, uncomplicated: Secondary | ICD-10-CM | POA: Diagnosis not present

## 2020-01-23 DIAGNOSIS — E119 Type 2 diabetes mellitus without complications: Secondary | ICD-10-CM

## 2020-01-23 DIAGNOSIS — J441 Chronic obstructive pulmonary disease with (acute) exacerbation: Secondary | ICD-10-CM | POA: Diagnosis not present

## 2020-01-23 DIAGNOSIS — Z794 Long term (current) use of insulin: Secondary | ICD-10-CM

## 2020-01-23 DIAGNOSIS — E118 Type 2 diabetes mellitus with unspecified complications: Secondary | ICD-10-CM | POA: Diagnosis not present

## 2020-01-23 DIAGNOSIS — J9601 Acute respiratory failure with hypoxia: Secondary | ICD-10-CM | POA: Diagnosis not present

## 2020-01-23 DIAGNOSIS — K219 Gastro-esophageal reflux disease without esophagitis: Secondary | ICD-10-CM | POA: Diagnosis not present

## 2020-01-23 LAB — COMPREHENSIVE METABOLIC PANEL
ALT: 32 U/L (ref 0–44)
AST: 44 U/L — ABNORMAL HIGH (ref 15–41)
Albumin: 3.7 g/dL (ref 3.5–5.0)
Alkaline Phosphatase: 50 U/L (ref 38–126)
Anion gap: 8 (ref 5–15)
BUN: 21 mg/dL — ABNORMAL HIGH (ref 6–20)
CO2: 27 mmol/L (ref 22–32)
Calcium: 8.9 mg/dL (ref 8.9–10.3)
Chloride: 106 mmol/L (ref 98–111)
Creatinine, Ser: 0.85 mg/dL (ref 0.61–1.24)
GFR, Estimated: >60 mL/min (ref >60)
Glucose, Bld: 184 mg/dL — ABNORMAL HIGH (ref 70–99)
Potassium: 4.5 mmol/L (ref 3.5–5.1)
Sodium: 141 mmol/L (ref 135–145)
Total Bilirubin: 0.5 mg/dL (ref 0.3–1.2)
Total Protein: 6.6 g/dL (ref 6.5–8.1)

## 2020-01-23 LAB — CBC
HCT: 48.7 % (ref 39.0–52.0)
Hemoglobin: 15.7 g/dL (ref 13.0–17.0)
MCH: 31.5 pg (ref 26.0–34.0)
MCHC: 32.2 g/dL (ref 30.0–36.0)
MCV: 97.8 fL (ref 80.0–100.0)
Platelets: 190 10*3/uL (ref 150–400)
RBC: 4.98 MIL/uL (ref 4.22–5.81)
RDW: 13.9 % (ref 11.5–15.5)
WBC: 6.1 10*3/uL (ref 4.0–10.5)
nRBC: 0 % (ref 0.0–0.2)

## 2020-01-23 LAB — HIV ANTIBODY (ROUTINE TESTING W REFLEX): HIV Screen 4th Generation wRfx: NONREACTIVE

## 2020-01-23 LAB — CBG MONITORING, ED: Glucose-Capillary: 228 mg/dL — ABNORMAL HIGH (ref 70–99)

## 2020-01-23 LAB — HEMOGLOBIN A1C
Hgb A1c MFr Bld: 7 % — ABNORMAL HIGH (ref 4.8–5.6)
Mean Plasma Glucose: 154.2 mg/dL

## 2020-01-23 LAB — GLUCOSE, CAPILLARY: Glucose-Capillary: 134 mg/dL — ABNORMAL HIGH (ref 70–99)

## 2020-01-23 MED ORDER — PREDNISONE 20 MG PO TABS: 40.0000 mg | ORAL_TABLET | Freq: Every day | ORAL | 0 refills | Status: AC

## 2020-01-23 MED ORDER — IPRATROPIUM-ALBUTEROL 0.5-2.5 (3) MG/3ML IN SOLN
3.0000 mL | Freq: Four times a day (QID) | RESPIRATORY_TRACT | Status: DC
  Administered 2020-01-23: 3 mL via RESPIRATORY_TRACT
  Filled 2020-01-23: qty 3

## 2020-01-23 MED ORDER — DOXYCYCLINE HYCLATE 100 MG PO TABS: 100.0000 mg | ORAL_TABLET | Freq: Two times a day (BID) | ORAL | 0 refills | Status: AC

## 2020-01-23 MED ORDER — TRULICITY 3 MG/0.5ML ~~LOC~~ SOAJ
1.0000 mg | SUBCUTANEOUS | Status: DC
Start: 2020-01-23 — End: 2020-06-09

## 2020-01-23 NOTE — Progress Notes (Signed)
01/23/2020 11:31 AM  Pt stormed out of the hospital prior to getting any paperwork. He did not get his Code 44 paperwork. Pt would not wait until his official discharge orders were entered.  I had already notified [facility] and was waiting for them to get back to me about his stastus.     Maryln Manuel MD

## 2020-01-23 NOTE — Care Management Obs Status (Signed)
MEDICARE OBSERVATION STATUS NOTIFICATION   Patient Details  Name: Charles Pennington MRN: [id number] Date of Birth: 1965/09/04   Medicare Observation Status Notification Given:  Yes    Elliot Gault, LCSW 01/23/2020, 12:19 PM

## 2020-01-23 NOTE — ED Notes (Signed)
Report given to [facility] on unit 300.

## 2020-01-23 NOTE — Discharge Summary (Signed)
Physician Discharge Summary  [name]:[id number] DOB: Oct 05, 1965 DOA: 01/22/2020  PCP: Sharion Balloon, FNP  Admit date: 01/22/2020 Discharge date: 01/23/2020  Pt stormed out of hospital prior to receiving any paperwork and prior to discharge orders being entered.   Admitted From:  Home  Disposition:  Home   Recommendations for Outpatient Follow-up:  1. Follow up with PCP in 1 weeks  Discharge Condition: STABLE   CODE STATUS: FULL    Brief Hospitalization Summary: Please see all hospital notes, images, labs for full details of the hospitalization. ADMISSION HPI: Charles Pennington is a 54 y.o. Pennington with medical history significant of DM, HTN, Hypothyroidism, back pain w/ L4-5 stenosis, CVA, and significant tobacco use history  Limited history able ot be obtained due to pts somnolence. Level 5 caveat applies  History obtained from EDP and Pt.   Pt w/ recent tx for DM foot ulcer on keflex which pt endorses complaince.  At approximately 14:00 pt became hoarse w/o respiratory sx, fever, body aches, CP. No sick contacts. Pt did have several ETOH drinks today but denies drug use. Back pain is his typical lower back pain w/ radiation to the leg.   ED Course: Objective findings as below. Pt found to be hypoxic and treated as COPD exacerbation.    Pt was admitted for observation and treatment of mild COPD exacerbation and suspect polypharmacy.  He responded well to supportive management, steroids, antibiotics, nebulizer treatments.  Unfortunately on the morning of 12/8 he was told that he would discharge but he ended up storming out of the hospital this morning before his discharge orders could be completed, he refused to wait for his discharge paperwork. His code 76 paperwork was not able to be given to him because he just left.    Discharge Diagnoses:  Principal Problem:   Acute respiratory failure with hypoxia (HCC) Active Problems:   Type 2 diabetes mellitus treated  with insulin (HCC)   GERD (gastroesophageal reflux disease)   Depression   Chronic back pain   Peripheral neuropathy   Diabetes mellitus with complication (HCC)   Substance abuse ([place])   Sciatica   HLD (hyperlipidemia)   COPD with acute exacerbation [facility])  Discharge Instructions:  Allergies as of 01/23/2020      Reactions   Hydrocodone-acetaminophen Itching, Other (See Comments)   Pt is able to tolerate with Benadryl.    Gadolinium Derivatives Nausea And Vomiting      Medication List    STOP taking these medications   cephALEXin 500 MG capsule Commonly known as: KEFLEX   HYDROcodone-acetaminophen 7.5-325 MG tablet Commonly known as: NORCO     TAKE these medications   albuterol (2.5 MG/3ML) 0.083% nebulizer solution Commonly known as: PROVENTIL NEBULIZE 1 VIAL EVERY 6 HOURS AS NEEDED FOR WHEEZING OR SHORTNESS OF BREATH What changed: See the new instructions.   albuterol 108 (90 Base) MCG/ACT inhaler Commonly known as: VENTOLIN HFA INHALE 2 PUFFS INTO THE LUNGS EVERY 6 (SIX) HOURS AS NEEDED FOR WHEEZING. What changed: Another medication with the same name was changed. Make sure you understand how and when to take each.   aspirin EC 81 MG tablet Take 1 tablet (81 mg total) by mouth daily.   atorvastatin 80 MG tablet Commonly known as: LIPITOR TAKE 1 TABLET DAILY   baclofen 10 MG tablet Commonly known as: LIORESAL Take 10 mg by mouth 2 (two) times daily as needed.   Belbuca 450 MCG Film Generic drug: Buprenorphine HCl Take 1  Film by mouth in the morning and at bedtime.   blood glucose meter kit and supplies Dispense based on patient and insurance preference. Test sugar BID and as needed   Contour Next Test test strip Generic drug: glucose blood Check blood sugar twice daily Dx E11.9   doxycycline 100 MG tablet Commonly known as: VIBRA-TABS Take 1 tablet (100 mg total) by mouth every 12 (twelve) hours for 5 days.   Farxiga 10 MG Tabs tablet Generic drug:  dapagliflozin propanediol Take 10 mg by mouth daily.   hydroxypropyl methylcellulose / hypromellose 2.5 % ophthalmic solution Commonly known as: ISOPTO TEARS / GONIOVISC Place 1 drop into both eyes as needed for dry eyes.   ibuprofen 800 MG tablet Commonly known as: ADVIL Take 800 mg by mouth in the morning and at bedtime.   levothyroxine 175 MCG tablet Commonly known as: SYNTHROID TAKE (1) TABLET DAILY BEFORE BREAKFAST. (needs labwork)   lisinopril 20 MG tablet Commonly known as: ZESTRIL Take 1 tablet (20 mg total) by mouth daily.   metFORMIN 750 MG 24 hr tablet Commonly known as: GLUCOPHAGE-XR TAKE 2 TABLET WITH MORNING MEAL   omeprazole 20 MG capsule Commonly known as: PRILOSEC TAKE (1) CAPSULE DAILY What changed: See the new instructions.   PARoxetine 40 MG tablet Commonly known as: PAXIL Take 1 tablet (40 mg total) by mouth daily.   predniSONE 20 MG tablet Commonly known as: DELTASONE Take 2 tablets (40 mg total) by mouth daily with breakfast for 5 days. Start taking on: January 24, 2020   pregabalin 150 MG capsule Commonly known as: LYRICA TAKE 2 CAPSULES EVERY MORNING AND 2 CAPSULES EVERY EVENING   Trelegy Ellipta 100-62.5-25 MCG/INH Aepb Generic drug: Fluticasone-Umeclidin-Vilant INHALE 1 ONCE A DAY AS DIRECTED What changed: See the new instructions.   Tyler Aas FlexTouch 200 UNIT/ML FlexTouch Pen Generic drug: insulin degludec Inject 60-120 Units into the skin daily. MAX daily dose of 120 units What changed:   how much to take  how to take this  when to take this   Trulicity 3 GT/3.6IW Sopn Generic drug: Dulaglutide Inject 1 mg as directed once a week. Discontinue 1.42m sq week dose   UltiCare Micro Pen Needles 32G X 4 MM Misc Generic drug: Insulin Pen Needle Use twice daily Dx E11.40   zolpidem 10 MG tablet Commonly known as: AMBIEN Take 1 tablet (10 mg total) by mouth at bedtime.       Follow-up Information    HSharion Balloon FNP  Follow up.   Specialty: Family Medicine Why: appointment time 1 week  Contact information: [phone]             Allergies  Allergen Reactions  . Hydrocodone-Acetaminophen Itching and Other (See Comments)    Pt is able to tolerate with Benadryl.   . Gadolinium Derivatives Nausea And Vomiting   Allergies as of 01/23/2020      Reactions   Hydrocodone-acetaminophen Itching, Other (See Comments)   Pt is able to tolerate with Benadryl.    Gadolinium Derivatives Nausea And Vomiting      Medication List    STOP taking these medications   cephALEXin 500 MG capsule Commonly known as: KEFLEX   HYDROcodone-acetaminophen 7.5-325 MG tablet Commonly known as: NORCO     TAKE these medications   albuterol (2.5 MG/3ML) 0.083% nebulizer solution Commonly known as: PROVENTIL NEBULIZE 1 VIAL EVERY 6 HOURS AS NEEDED FOR WHEEZING OR SHORTNESS OF BREATH What changed:  See the new instructions.   albuterol 108 (90 Base) MCG/ACT inhaler Commonly known as: VENTOLIN HFA INHALE 2 PUFFS INTO THE LUNGS EVERY 6 (SIX) HOURS AS NEEDED FOR WHEEZING. What changed: Another medication with the same name was changed. Make sure you understand how and when to take each.   aspirin EC 81 MG tablet Take 1 tablet (81 mg total) by mouth daily.   atorvastatin 80 MG tablet Commonly known as: LIPITOR TAKE 1 TABLET DAILY   baclofen 10 MG tablet Commonly known as: LIORESAL Take 10 mg by mouth 2 (two) times daily as needed.   Belbuca 450 MCG Film Generic drug: Buprenorphine HCl Take 1 Film by mouth in the morning and at bedtime.   blood glucose meter kit and supplies Dispense based on patient and insurance preference. Test sugar BID and as needed   Contour Next Test test strip Generic drug: glucose blood Check blood sugar twice daily Dx E11.9   doxycycline 100 MG tablet Commonly known as: VIBRA-TABS Take 1 tablet (100 mg total) by mouth every 12  (twelve) hours for 5 days.   Farxiga 10 MG Tabs tablet Generic drug: dapagliflozin propanediol Take 10 mg by mouth daily.   hydroxypropyl methylcellulose / hypromellose 2.5 % ophthalmic solution Commonly known as: ISOPTO TEARS / GONIOVISC Place 1 drop into both eyes as needed for dry eyes.   ibuprofen 800 MG tablet Commonly known as: ADVIL Take 800 mg by mouth in the morning and at bedtime.   levothyroxine 175 MCG tablet Commonly known as: SYNTHROID TAKE (1) TABLET DAILY BEFORE BREAKFAST. (needs labwork)   lisinopril 20 MG tablet Commonly known as: ZESTRIL Take 1 tablet (20 mg total) by mouth daily.   metFORMIN 750 MG 24 hr tablet Commonly known as: GLUCOPHAGE-XR TAKE 2 TABLET WITH MORNING MEAL   omeprazole 20 MG capsule Commonly known as: PRILOSEC TAKE (1) CAPSULE DAILY What changed: See the new instructions.   PARoxetine 40 MG tablet Commonly known as: PAXIL Take 1 tablet (40 mg total) by mouth daily.   predniSONE 20 MG tablet Commonly known as: DELTASONE Take 2 tablets (40 mg total) by mouth daily with breakfast for 5 days. Start taking on: January 24, 2020   pregabalin 150 MG capsule Commonly known as: LYRICA TAKE 2 CAPSULES EVERY MORNING AND 2 CAPSULES EVERY EVENING   Trelegy Ellipta 100-62.5-25 MCG/INH Aepb Generic drug: Fluticasone-Umeclidin-Vilant INHALE 1 ONCE A DAY AS DIRECTED What changed: See the new instructions.   Tyler Aas FlexTouch 200 UNIT/ML FlexTouch Pen Generic drug: insulin degludec Inject 60-120 Units into the skin daily. MAX daily dose of 120 units What changed:   how much to take  how to take this  when to take this   Trulicity 3 CV/8.9FY Sopn Generic drug: Dulaglutide Inject 1 mg as directed once a week. Discontinue 1.81m sq week dose   UltiCare Micro Pen Needles 32G X 4 MM Misc Generic drug: Insulin Pen Needle Use twice daily Dx E11.40   zolpidem 10 MG tablet Commonly known as: AMBIEN Take 1 tablet (10 mg total) by mouth at  bedtime.       Procedures/Studies: DG Chest Port 1 View  Result Date: 01/22/2020 CLINICAL DATA:  Cough. EXAM: PORTABLE CHEST 1 VIEW COMPARISON:  December 19, 2017 FINDINGS: Very mildly increased bronchovascular lung markings are seen without evidence of acute infiltrate, pleural effusion or pneumothorax. The heart size and mediastinal contours are within normal limits. Multiple chronic left-sided rib fractures are seen. IMPRESSION: Very mildly increased bronchovascular lung markings  without evidence of acute infiltrate. Electronically Signed   By: Virgina Norfolk M.D.   On: 01/22/2020 18:38   DG Toe Great Right  Result Date: 01/08/2020 CLINICAL DATA:  Diabetic ulcer. EXAM: RIGHT GREAT TOE COMPARISON:  07/26/2019 FINDINGS: There is a soft tissue ulcer along the plantar surface of the great toe. No underlying bone erosion identified to suggest osteomyelitis. No fractures or dislocation. Foreshortening of the fifth metatarsal bone is identified and appears unchanged from 07/26/2019. IMPRESSION: Soft tissue ulcer along the plantar surface of the great toe. No underlying bone erosion to suggest osteomyelitis. Electronically Signed   By: Kerby Moors M.D.   On: 01/08/2020 15:44      Subjective: Pt reports that he is breathing much better and he feels ready to go home.  No SOB or CP.  No fever or chills.   Discharge Exam: Vitals:   01/23/20 0417 01/23/20 0934  BP: (!) 118/92   Pulse: 78   Resp: 18   Temp: 97.7 F (36.5 C)   SpO2: 98% 93%   Vitals:   01/23/20 0300 01/23/20 0315 01/23/20 0417 01/23/20 0934  BP: 133/89 133/89 (!) 118/92   Pulse:  73 78   Resp: 16 15 18    Temp:   97.7 F (36.5 C)   TempSrc:   Oral   SpO2:  96% 98% 93%  Weight:   106.7 kg   Height:   5' 7"  (1.702 m)    General: Pt is alert, awake, not in acute distress Cardiovascular: normal S1/S2 +, no rubs, no gallops Respiratory: no increased work of breathing,  no wheezing, no rhonchi Abdominal: Soft, NT, ND,  bowel sounds + Extremities: no edema, no cyanosis   The results of significant diagnostics from this hospitalization (including imaging, microbiology, ancillary and laboratory) are listed below for reference.     Microbiology: Recent Results (from the past 240 hour(s))  Resp Panel by RT-PCR (Flu A&B, Covid) Nasopharyngeal Swab     Status: None   Collection Time: 01/22/20  5:42 PM   Specimen: Nasopharyngeal Swab; Nasopharyngeal(NP) swabs in vial transport medium  Result Value Ref Range Status   SARS Coronavirus 2 by RT PCR NEGATIVE NEGATIVE Final    Comment: (NOTE) SARS-CoV-2 target nucleic acids are NOT DETECTED.  The SARS-CoV-2 RNA is generally detectable in upper respiratory specimens during the acute phase of infection. The lowest concentration of SARS-CoV-2 viral copies this assay can detect is 138 copies/mL. A negative result does not preclude SARS-Cov-2 infection and should not be used as the sole basis for treatment or other patient management decisions. A negative result may occur with  improper specimen collection/handling, submission of specimen other than nasopharyngeal swab, presence of viral mutation(s) within the areas targeted by this assay, and inadequate number of viral copies(<138 copies/mL). A negative result must be combined with clinical observations, patient history, and epidemiological information. The expected result is Negative.  Fact Sheet for Patients:  EntrepreneurPulse.com.au  Fact Sheet for Healthcare Providers:  IncredibleEmployment.be  This test is no t yet approved or cleared by the [place] FDA and  has been authorized for detection and/or diagnosis of SARS-CoV-2 by FDA under an Emergency Use Authorization (EUA). This EUA will remain  in effect (meaning this test can be used) for the duration of the COVID-19 declaration under Section 564(b)(1) of the Act, 21 U.S.C.section 360bbb-3(b)(1), unless the  authorization is terminated  or revoked sooner.       Influenza A by PCR NEGATIVE NEGATIVE Final  Influenza B by PCR NEGATIVE NEGATIVE Final    Comment: (NOTE) The Xpert Xpress SARS-CoV-2/FLU/RSV plus assay is intended as an aid in the diagnosis of influenza from Nasopharyngeal swab specimens and should not be used as a sole basis for treatment. Nasal washings and aspirates are unacceptable for Xpert Xpress SARS-CoV-2/FLU/RSV testing.  Fact Sheet for Patients: EntrepreneurPulse.com.au  Fact Sheet for Healthcare Providers: IncredibleEmployment.be  This test is not yet approved or cleared by the [place] FDA and has been authorized for detection and/or diagnosis of SARS-CoV-2 by FDA under an Emergency Use Authorization (EUA). This EUA will remain in effect (meaning this test can be used) for the duration of the COVID-19 declaration under Section 564(b)(1) of the Act, 21 U.S.C. section 360bbb-3(b)(1), unless the authorization is terminated or revoked.  Performed at [facility], [address] [place], [place] [address]   Group A Strep by PCR     Status: None   Collection Time: 01/22/20  6:22 PM   Specimen: Throat; Sterile Swab  Result Value Ref Range Status   Group A Strep by PCR NOT DETECTED NOT DETECTED Final    Comment: Performed at [facility], [address], [place], [place] [address]     Labs: BNP (last 3 results) No results for input(s): BNP in the last 8760 hours. Basic Metabolic Panel: Recent Labs  Lab 01/22/20 1737 01/23/20 0509  NA 140 141  K 3.1* 4.5  CL 102 106  CO2 28 27  GLUCOSE 190* 184*  BUN 31* 21*  CREATININE 1.37* 0.85  CALCIUM 9.6 8.9   Liver Function Tests: Recent Labs  Lab 01/22/20 1737 01/23/20 0509  AST 59* 44*  ALT 33 32  ALKPHOS 53 50  BILITOT 0.5 0.5  PROT 6.7 6.6  ALBUMIN 3.9 3.7   No results for input(s): LIPASE, AMYLASE in the last 168 hours. No results for input(s): AMMONIA in  the last 168 hours. CBC: Recent Labs  Lab 01/22/20 1737 01/23/20 0509  WBC 9.3 6.1  NEUTROABS 5.3  --   HGB 14.7 15.7  HCT 44.1 48.7  MCV 95.5 97.8  PLT 182 190   Cardiac Enzymes: No results for input(s): CKTOTAL, CKMB, CKMBINDEX, TROPONINI in the last 168 hours. BNP: Invalid input(s): POCBNP CBG: Recent Labs  Lab 01/23/20 0016 01/23/20 0755  GLUCAP 228* 134*   D-Dimer No results for input(s): DDIMER in the last 72 hours. Hgb A1c Recent Labs    01/22/20 1737  HGBA1C 7.0*   Lipid Profile No results for input(s): CHOL, HDL, LDLCALC, TRIG, CHOLHDL, LDLDIRECT in the last 72 hours. Thyroid function studies No results for input(s): TSH, T4TOTAL, T3FREE, THYROIDAB in the last 72 hours.  Invalid input(s): FREET3 Anemia work up No results for input(s): VITAMINB12, FOLATE, FERRITIN, TIBC, IRON, RETICCTPCT in the last 72 hours. Urinalysis    Component Value Date/Time   COLORURINE YELLOW 01/22/2020 1955   APPEARANCEUR CLEAR 01/22/2020 1955   LABSPEC 1.031 (H) 01/22/2020 1955   PHURINE 5.0 01/22/2020 1955   GLUCOSEU >=500 (A) 01/22/2020 1955   HGBUR NEGATIVE 01/22/2020 1955   BILIRUBINUR NEGATIVE 01/22/2020 1955   KETONESUR NEGATIVE 01/22/2020 1955   PROTEINUR NEGATIVE 01/22/2020 1955   UROBILINOGEN 0.2 04/16/2014 1134   NITRITE NEGATIVE 01/22/2020 1955   LEUKOCYTESUR NEGATIVE 01/22/2020 1955   Sepsis Labs Invalid input(s): PROCALCITONIN,  WBC,  LACTICIDVEN Microbiology Recent Results (from the past 240 hour(s))  Resp Panel by RT-PCR (Flu A&B, Covid) Nasopharyngeal Swab     Status: None   Collection  Time: 01/22/20  5:42 PM   Specimen: Nasopharyngeal Swab; Nasopharyngeal(NP) swabs in vial transport medium  Result Value Ref Range Status   SARS Coronavirus 2 by RT PCR NEGATIVE NEGATIVE Final    Comment: (NOTE) SARS-CoV-2 target nucleic acids are NOT DETECTED.  The SARS-CoV-2 RNA is generally detectable in upper respiratory specimens during the acute phase of  infection. The lowest concentration of SARS-CoV-2 viral copies this assay can detect is 138 copies/mL. A negative result does not preclude SARS-Cov-2 infection and should not be used as the sole basis for treatment or other patient management decisions. A negative result may occur with  improper specimen collection/handling, submission of specimen other than nasopharyngeal swab, presence of viral mutation(s) within the areas targeted by this assay, and inadequate number of viral copies(<138 copies/mL). A negative result must be combined with clinical observations, patient history, and epidemiological information. The expected result is Negative.  Fact Sheet for Patients:  EntrepreneurPulse.com.au  Fact Sheet for Healthcare Providers:  IncredibleEmployment.be  This test is no t yet approved or cleared by the [place] FDA and  has been authorized for detection and/or diagnosis of SARS-CoV-2 by FDA under an Emergency Use Authorization (EUA). This EUA will remain  in effect (meaning this test can be used) for the duration of the COVID-19 declaration under Section 564(b)(1) of the Act, 21 U.S.C.section 360bbb-3(b)(1), unless the authorization is terminated  or revoked sooner.       Influenza A by PCR NEGATIVE NEGATIVE Final   Influenza B by PCR NEGATIVE NEGATIVE Final    Comment: (NOTE) The Xpert Xpress SARS-CoV-2/FLU/RSV plus assay is intended as an aid in the diagnosis of influenza from Nasopharyngeal swab specimens and should not be used as a sole basis for treatment. Nasal washings and aspirates are unacceptable for Xpert Xpress SARS-CoV-2/FLU/RSV testing.  Fact Sheet for Patients: EntrepreneurPulse.com.au  Fact Sheet for Healthcare Providers: IncredibleEmployment.be  This test is not yet approved or cleared by the [place] FDA and has been authorized for detection and/or diagnosis of SARS-CoV-2  by FDA under an Emergency Use Authorization (EUA). This EUA will remain in effect (meaning this test can be used) for the duration of the COVID-19 declaration under Section 564(b)(1) of the Act, 21 U.S.C. section 360bbb-3(b)(1), unless the authorization is terminated or revoked.  Performed at [facility], [address], [place], [place] [address]   Group A Strep by PCR     Status: None   Collection Time: 01/22/20  6:22 PM   Specimen: Throat; Sterile Swab  Result Value Ref Range Status   Group A Strep by PCR NOT DETECTED NOT DETECTED Final    Comment: Performed at [facility], [address], [place], [place] [address]    Time coordinating discharge:   SIGNED:  Irwin Brakeman, MD  [facility] 01/23/2020, 11:44 AM How to contact the [facility] Attending or Consulting provider [place] or covering provider during after hours [place], for this patient?  1. Check the care team in [facility] and look for a) attending/consulting [facility] provider listed and b) the [facility] team listed 2. Log into [email or web address] and use [place]'s universal password to access. If you do not have the password, please contact the hospital operator. 3. Locate the [facility] provider you are looking for under [facility] and page to a number that you can be directly reached. 4. If you still have difficulty reaching the provider, please page the [facility] (Director on Call) for the Hospitalists listed on amion for assistance.

## 2020-01-23 NOTE — Telephone Encounter (Signed)
Contact Date: 01/23/2020 Contacted By: Truett Mainland, LPN  Transition Care Management Follow-up Telephone Call  Date of discharge and from where: 01/23/2020  Discharge Diagnosis: Acute Respiratory Failure with hypoxia   How have you been since you were released from the hospital? Better   Any questions or concerns? No   Items Reviewed:  Did the pt receive and understand the discharge instructions provided? Yes   Medications obtained and verified? Yes   Any new allergies since your discharge? No   Dietary orders reviewed? No  Do you have support at home? No   Discontinued Medications Cephalexin Hydrocodone-Acetaminophen New Medications Added Doxycycline  Prednisone   Current Medication List Allergies as of 01/23/2020      Reactions   Hydrocodone-acetaminophen Itching, Other (See Comments)   Pt is able to tolerate with Benadryl.    Gadolinium Derivatives Nausea And Vomiting      Medication List       Accurate as of January 23, 2020  2:47 PM. If you have any questions, ask your nurse or doctor.        albuterol (2.5 MG/3ML) 0.083% nebulizer solution Commonly known as: PROVENTIL NEBULIZE 1 VIAL EVERY 6 HOURS AS NEEDED FOR WHEEZING OR SHORTNESS OF BREATH What changed: See the new instructions.   albuterol 108 (90 Base) MCG/ACT inhaler Commonly known as: VENTOLIN HFA INHALE 2 PUFFS INTO THE LUNGS EVERY 6 (SIX) HOURS AS NEEDED FOR WHEEZING. What changed: Another medication with the same name was changed. Make sure you understand how and when to take each.   aspirin EC 81 MG tablet Take 1 tablet (81 mg total) by mouth daily.   atorvastatin 80 MG tablet Commonly known as: LIPITOR TAKE 1 TABLET DAILY   baclofen 10 MG tablet Commonly known as: LIORESAL Take 10 mg by mouth 2 (two) times daily as needed.   Belbuca 450 MCG Film Generic drug: Buprenorphine HCl Take 1 Film by mouth in the morning and at bedtime.   blood glucose meter kit and supplies Dispense  based on patient and insurance preference. Test sugar BID and as needed   Contour Next Test test strip Generic drug: glucose blood Check blood sugar twice daily Dx E11.9   doxycycline 100 MG tablet Commonly known as: VIBRA-TABS Take 1 tablet (100 mg total) by mouth every 12 (twelve) hours for 5 days.   Farxiga 10 MG Tabs tablet Generic drug: dapagliflozin propanediol Take 10 mg by mouth daily.   hydroxypropyl methylcellulose / hypromellose 2.5 % ophthalmic solution Commonly known as: ISOPTO TEARS / GONIOVISC Place 1 drop into both eyes as needed for dry eyes.   ibuprofen 800 MG tablet Commonly known as: ADVIL Take 800 mg by mouth in the morning and at bedtime.   levothyroxine 175 MCG tablet Commonly known as: SYNTHROID TAKE (1) TABLET DAILY BEFORE BREAKFAST. (needs labwork)   lisinopril 20 MG tablet Commonly known as: ZESTRIL Take 1 tablet (20 mg total) by mouth daily.   metFORMIN 750 MG 24 hr tablet Commonly known as: GLUCOPHAGE-XR TAKE 2 TABLET WITH MORNING MEAL   omeprazole 20 MG capsule Commonly known as: PRILOSEC TAKE (1) CAPSULE DAILY What changed: See the new instructions.   PARoxetine 40 MG tablet Commonly known as: PAXIL Take 1 tablet (40 mg total) by mouth daily.   predniSONE 20 MG tablet Commonly known as: DELTASONE Take 2 tablets (40 mg total) by mouth daily with breakfast for 5 days. Start taking on: January 24, 2020   pregabalin 150 MG capsule Commonly known  as: LYRICA TAKE 2 CAPSULES EVERY MORNING AND 2 CAPSULES EVERY EVENING   Trelegy Ellipta 100-62.5-25 MCG/INH Aepb Generic drug: Fluticasone-Umeclidin-Vilant INHALE 1 ONCE A DAY AS DIRECTED What changed: See the new instructions.   Tyler Aas FlexTouch 200 UNIT/ML FlexTouch Pen Generic drug: insulin degludec Inject 60-120 Units into the skin daily. MAX daily dose of 120 units What changed:   how much to take  how to take this  when to take this   Trulicity 3 MI/6.8EH Sopn Generic drug:  Dulaglutide Inject 1 mg as directed once a week. Discontinue 1.57m sq week dose   UltiCare Micro Pen Needles 32G X 4 MM Misc Generic drug: Insulin Pen Needle Use twice daily Dx E11.40   zolpidem 10 MG tablet Commonly known as: AMBIEN Take 1 tablet (10 mg total) by mouth at bedtime.        Home Care and Equipment/Supplies: Were home health services ordered? no If so, what is the name of the agency?   Has the agency set up a time to come to the patient's home? not applicable Were any new equipment or medical supplies ordered?  No What is the name of the medical supply agency? N/A Were you able to get the supplies/equipment? not applicable Do you have any questions related to the use of the equipment or supplies? No  Functional Questionnaire: (I = Independent and D = Dependent) ADLs: I   Bathing/Dressing- I  Meal Prep- I  Eating- I  Maintaining continence- I  Transferring/Ambulation- I  Managing Meds- I  Follow up appointments reviewed:   PCP Hospital f/u appt confirmed? Yes  Scheduled to see CEvelina Dun FNP on 01/02/2020 @ 11:40AM  Specialist Hospital f/u appt confirmed? No  Scheduled to   Are transportation arrangements needed? No   If their condition worsens, is the pt aware to call PCP or go to the Emergency Dept.? Yes  Was the patient provided with contact information for the PCP's office or ED? Yes  Was to pt encouraged to call back with questions or concerns? Yes

## 2020-01-23 NOTE — Discharge Instructions (Signed)
Chronic Obstructive Pulmonary Disease Exacerbation Chronic obstructive pulmonary disease (COPD) is a long-term (chronic) lung problem. In COPD, the flow of air from the lungs is limited. COPD exacerbations are times that breathing gets worse and you need more than your normal treatment. Without treatment, they can be life threatening. If they happen often, your lungs can become more damaged. If your COPD gets worse, your doctor may treat you with:  Medicines.  Oxygen.  Different ways to clear your airway, such as using a mask. Follow these instructions at home: Medicines  Take over-the-counter and prescription medicines only as told by your doctor.  If you take an antibiotic or steroid medicine, do not stop taking the medicine even if you start to feel better.  Keep up with shots (vaccinations) as told by your doctor. Be sure to get a yearly (annual) flu shot. Lifestyle  Do not smoke. If you need help quitting, ask your doctor.  Eat healthy foods.  Exercise regularly.  Get plenty of sleep.  Avoid tobacco smoke and other things that can bother your lungs.  Wash your hands often with soap and water. This will help keep you from getting an infection. If you cannot use soap and water, use hand sanitizer.  During flu season, avoid areas that are crowded with people. General instructions  Drink enough fluid to keep your pee (urine) clear or pale yellow. Do not do this if your doctor has told you not to.  Use a cool mist machine (vaporizer).  If you use oxygen or a machine that turns medicine into a mist (nebulizer), continue to use it as told.  Follow all instructions for rehabilitation. These are steps you can take to make your body work better.  Keep all follow-up visits as told by your doctor. This is important. Contact a doctor if:  Your COPD symptoms get worse than normal. Get help right away if:  You are short of breath and it gets worse.  You have trouble  talking.  You have chest pain.  You cough up blood.  You have a fever.  You keep throwing up (vomiting).  You feel weak or you pass out (faint).  You feel confused.  You are not able to sleep because of your symptoms.  You are not able to do daily activities. Summary  COPD exacerbations are times that breathing gets worse and you need more treatment than normal.  COPD exacerbations can be very serious and may cause your lungs to become more damaged.  Do not smoke. If you need help quitting, ask your doctor.  Stay up-to-date on your shots. Get a flu shot every year. This information is not intended to replace advice given to you by your health care provider. Make sure you discuss any questions you have with your health care provider. Document Revised: 01/14/2017 Document Reviewed: 03/08/2016 Elsevier Patient Education  2020 Elsevier [employer]   IMPORTANT INFORMATION: PAY CLOSE ATTENTION   PHYSICIAN DISCHARGE INSTRUCTIONS  Follow with Primary care provider  Junie Spencer, FNP  and other consultants as instructed by your Hospitalist Physician  SEEK MEDICAL CARE OR RETURN TO EMERGENCY ROOM IF SYMPTOMS COME BACK, WORSEN OR NEW PROBLEM DEVELOPS   Please note: You were cared for by a hospitalist during your hospital stay. Every effort will be made to forward records to your primary care provider.  You can request that your primary care provider send for your hospital records if they have not received them.  Once you are discharged, your  primary care physician will handle any further medical issues. Please note that NO REFILLS for any discharge medications will be authorized once you are discharged, as it is imperative that you return to your primary care physician (or establish a relationship with a primary care physician if you do not have one) for your post hospital discharge needs so that they can reassess your need for medications and monitor your lab values.  Please get a  complete blood count and chemistry panel checked by your Primary MD at your next visit, and again as instructed by your Primary MD.  Get Medicines reviewed and adjusted: Please take all your medications with you for your next visit with your Primary MD  Laboratory/radiological data: Please request your Primary MD to go over all hospital tests and procedure/radiological results at the follow up, please ask your primary care provider to get all Hospital records sent to his/her office.  In some cases, they will be blood work, cultures and biopsy results pending at the time of your discharge. Please request that your primary care provider follow up on these results.  If you are diabetic, please bring your blood sugar readings with you to your follow up appointment with primary care.    Please call and make your follow up appointments as soon as possible.    Also Note the following: If you experience worsening of your admission symptoms, develop shortness of breath, life threatening emergency, suicidal or homicidal thoughts you must seek medical attention immediately by calling 911 or calling your MD immediately  if symptoms less severe.  You must read complete instructions/literature along with all the possible adverse reactions/side effects for all the Medicines you take and that have been prescribed to you. Take any new Medicines after you have completely understood and accpet all the possible adverse reactions/side effects.   Do not drive when taking Pain medications or sleeping medications (Benzodiazepines)  Do not take more than prescribed Pain, Sleep and Anxiety Medications. It is not advisable to combine anxiety,sleep and pain medications without talking with your primary care practitioner  Special Instructions: If you have smoked or chewed Tobacco  in the last 2 yrs please stop smoking, stop any regular Alcohol  and or any Recreational drug use.  Wear Seat belts while driving.  Do not  drive if taking any narcotic, mind altering or controlled substances or recreational drugs or alcohol.

## 2020-01-23 NOTE — Care Management CC44 (Signed)
Condition Code 44 Documentation Completed  Patient Details  Name: EUGENE ISADORE MRN: [id number] Date of Birth: [birth year]   Condition Code 44 given:  Yes Patient signature on Condition Code 44 notice:  Yes (LCSW signature) Documentation of 2 MD's agreement:  Yes Code 44 added to claim:  Yes    Elliot Gault, LCSW 01/23/2020, 12:19 PM

## 2020-01-23 NOTE — Progress Notes (Signed)
Upon entering patient's room, he informed me that the Doctor said " I can go home". I said ok, is that why you unhooked your IV? Patient said  "YES". I said to him that it is a process and once the orders are in, I will come with your paper work.. I also informed him that I was going to call his PCP to get a follow up appointment.  However, while  At the nurses station patient comes up and state " I'm ready to go". By this time I had spoken with Dr Laural Benes and informed him of the patient's readiness to go. I was informed that the discharge would be in shortly.  While at the nursing station on a phone call with his PCP, patient walks up and say again "I'm ready to go," this had not even been 10 minutes from the first visit he made to the station and informed us of his readiness to go. I walk to the patient's room to inform him that the Doctor was going to be putting the orders in, he states" I have to smoke, I'm leaving"..  I  Informed the patient that we were trying to do what's best for him. Patient walks out and leaves before getting discharge instructions. Patient was accompanied by a friend off the floor.

## 2020-01-23 NOTE — ED Notes (Signed)
Attempted to call report.  Nurse unavailable.  [address] to call ED back.

## 2020-01-23 NOTE — Progress Notes (Signed)
PT Cancellation Note  Patient Details Name: Charles Pennington MRN: [id number] DOB: 17-May-1965   Cancelled Treatment:    Reason Eval/Treat Not Completed: PT screened, no needs identified, will sign off.  Patient transferring and ambulating in room independently.   10:54 AM, 01/23/20 Ocie Bob, MPT Physical Therapist with [facility] 336 [phone] office [phone] mobile phone

## 2020-01-24 DIAGNOSIS — G894 Chronic pain syndrome: Secondary | ICD-10-CM | POA: Diagnosis not present

## 2020-01-24 DIAGNOSIS — Z79899 Other long term (current) drug therapy: Secondary | ICD-10-CM | POA: Diagnosis not present

## 2020-01-24 DIAGNOSIS — G8929 Other chronic pain: Secondary | ICD-10-CM | POA: Diagnosis not present

## 2020-01-24 DIAGNOSIS — M545 Low back pain, unspecified: Secondary | ICD-10-CM | POA: Diagnosis not present

## 2020-01-28 ENCOUNTER — Telehealth: Payer: Self-pay

## 2020-01-28 NOTE — Telephone Encounter (Signed)
Do you know anything about this? 

## 2020-01-28 NOTE — Telephone Encounter (Signed)
lmtcb to verify with patient if he is requesting this, sometimes patient does not want these and companies just send this request to [place]

## 2020-01-28 NOTE — Telephone Encounter (Signed)
Jane from [employer] is calling about back brace rx. She wants to know if it has been approved. Please call back

## 2020-01-29 NOTE — Telephone Encounter (Signed)
Pt is requesting back brace Tried returning call to [employer] to request another form to be sent to office, incorrect phone number

## 2020-01-30 ENCOUNTER — Other Ambulatory Visit: Payer: Self-pay | Admitting: Family

## 2020-02-01 ENCOUNTER — Other Ambulatory Visit: Payer: Self-pay

## 2020-02-01 ENCOUNTER — Encounter: Payer: Self-pay | Admitting: Family

## 2020-02-01 ENCOUNTER — Ambulatory Visit (INDEPENDENT_AMBULATORY_CARE_PROVIDER_SITE_OTHER): Payer: Medicare PPO | Admitting: Family

## 2020-02-01 VITALS — BP 126/81 | HR 92 | Temp 97.1°F | Ht 67.0 in | Wt 232.0 lb

## 2020-02-01 DIAGNOSIS — I69993 Ataxia following unspecified cerebrovascular disease: Secondary | ICD-10-CM | POA: Diagnosis not present

## 2020-02-01 DIAGNOSIS — Z9114 Patient's other noncompliance with medication regimen: Secondary | ICD-10-CM

## 2020-02-01 DIAGNOSIS — R062 Wheezing: Secondary | ICD-10-CM

## 2020-02-01 DIAGNOSIS — R413 Other amnesia: Secondary | ICD-10-CM | POA: Diagnosis not present

## 2020-02-01 DIAGNOSIS — J209 Acute bronchitis, unspecified: Secondary | ICD-10-CM

## 2020-02-01 DIAGNOSIS — Z1211 Encounter for screening for malignant neoplasm of colon: Secondary | ICD-10-CM

## 2020-02-01 DIAGNOSIS — Z09 Encounter for follow-up examination after completed treatment for conditions other than malignant neoplasm: Secondary | ICD-10-CM | POA: Diagnosis not present

## 2020-02-01 DIAGNOSIS — J440 Chronic obstructive pulmonary disease with acute lower respiratory infection: Secondary | ICD-10-CM

## 2020-02-01 DIAGNOSIS — J441 Chronic obstructive pulmonary disease with (acute) exacerbation: Secondary | ICD-10-CM

## 2020-02-01 DIAGNOSIS — R2689 Other abnormalities of gait and mobility: Secondary | ICD-10-CM | POA: Diagnosis not present

## 2020-02-01 DIAGNOSIS — E118 Type 2 diabetes mellitus with unspecified complications: Secondary | ICD-10-CM

## 2020-02-01 MED ORDER — PREDNISONE 20 MG PO TABS: ORAL_TABLET | ORAL | 0 refills | Status: DC

## 2020-02-01 MED ORDER — DOXYCYCLINE HYCLATE 100 MG PO TABS: 100.0000 mg | ORAL_TABLET | Freq: Two times a day (BID) | ORAL | 0 refills | Status: DC

## 2020-02-01 MED ORDER — TRELEGY ELLIPTA 100-62.5-25 MCG/INH IN AEPB: INHALATION_SPRAY | RESPIRATORY_TRACT | 2 refills | Status: DC

## 2020-02-01 NOTE — Progress Notes (Signed)
Subjective:    Patient ID: Charles Pennington, male    DOB: 1965/06/23, 54 y.o.   MRN: [id number]  Chief Complaint  Patient presents with   Transitions Of Care    HPI Today's visit was for Transitional Care Management.  The patient was discharged from [facility] on 01/23/20 with a primary diagnosis of COPD exacerbation.    Contact with the patient and/or caregiver, by a clinical staff member, was made on 01/23/20 and was documented as a telephone encounter within the EMR.  Through chart review and discussion with the patient I have determined that management of their condition is of moderate complexity.    Pt went to the ED with SOB. He was giving steroids, antibiotics, nebulizer treatment. His flu and COVID was negative.   He continues to smoke 1/2 pack a day.   He is complaining increased falls and balance issues over the last month. 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 He has not seen a Neurologists recently. He is noncompliant with his medications and does use street drugs. He reports he has not used "anything illegally".   He reports his blood sugars have been slightly better since decreasing his sugars. He reports he has taken his medications "most days".   Review of Systems  Constitutional: Positive for fatigue.  Respiratory: Positive for cough and shortness of breath.   Neurological: Positive for dizziness and weakness.  All other systems reviewed and are negative.      Objective:   Physical Exam Vitals reviewed.  Constitutional:      General: He is not in acute distress.    Appearance: He is well-developed and well-nourished.  HENT:     Head: Normocephalic.     Mouth/Throat:     Mouth: Oropharynx is clear and moist.  Eyes:     General:        Right eye: No discharge.        Left eye: No discharge.     Pupils: Pupils are equal, round, and reactive to light.  Neck:     Thyroid: No thyromegaly.  Cardiovascular:     Rate and Rhythm:  Normal rate and regular rhythm.     Pulses: Intact distal pulses.     Heart sounds: Normal heart sounds. No murmur heard.   Pulmonary:     Effort: Pulmonary effort is normal. No respiratory distress.     Breath sounds: Wheezing and rhonchi present.  Chest:     Chest wall: Tenderness present.  Abdominal:     General: Bowel sounds are normal. There is no distension.     Palpations: Abdomen is soft.     Tenderness: There is no abdominal tenderness.  Musculoskeletal:        General: No tenderness or edema. Normal range of motion.     Cervical back: Normal range of motion and neck supple.  Skin:    General: Skin is warm and dry.     Coloration: Skin is pale.     Findings: No erythema or rash.  Neurological:     Mental Status: He is alert and oriented to person, place, and time.     Cranial Nerves: No cranial nerve deficit.     Motor: No weakness.     Gait: Gait normal.     Deep Tendon Reflexes: Reflexes are normal and symmetric.  Psychiatric:        Mood and Affect: Mood and affect normal.  Behavior: Behavior is hyperactive.        Thought Content: Thought content normal.        Judgment: Judgment normal.       BP 126/81    Pulse 92    Temp (!) 97.1 F (36.2 C) (Temporal)    Ht 5' 7"  (1.702 m)    Wt 232 lb (105.2 kg)    SpO2 95%    BMI 36.34 kg/m      Assessment & Plan:  ELIGE SHOUSE comes in today with chief complaint of Transitions Of Care   Diagnosis and orders addressed: 1. Hospital discharge follow-up - CMP14+EGFR - CBC with Differential/Platelet  2. Morbid obesity ([place]) - CMP14+EGFR - CBC with Differential/Platelet  3. Acute bronchitis with COPD ([place]) - CMP14+EGFR - CBC with Differential/Platelet - Fluticasone-Umeclidin-Vilant (TRELEGY ELLIPTA) 100-62.5-25 MCG/INH AEPB; INHALE 1 ONCE A DAY AS DIRECTED  Dispense: 60 each; Refill: 2 - doxycycline (VIBRA-TABS) 100 MG tablet; Take 1 tablet (100 mg total) by mouth 2 (two) times daily.  Dispense: 20  tablet; Refill: 0  4. Noncompliance with medications - Ambulatory referral to Neurology - CMP14+EGFR - CBC with Differential/Platelet  5. Ataxia, late effect of cerebrovascular disease - Ambulatory referral to Neurology - CMP14+EGFR - CBC with Differential/Platelet  6. Memory difficulties - Ambulatory referral to Neurology - CMP14+EGFR - CBC with Differential/Platelet  7. Colon cancer screening - Cologuard - CMP14+EGFR - CBC with Differential/Platelet  8. Balance problem - Ambulatory referral to Neurology - CMP14+EGFR - CBC with Differential/Platelet  9. Wheezing - Fluticasone-Umeclidin-Vilant (TRELEGY ELLIPTA) 100-62.5-25 MCG/INH AEPB; INHALE 1 ONCE A DAY AS DIRECTED  Dispense: 60 each; Refill: 2  10. COPD exacerbation ([place]) Start doxycyline and prednisone today Continue Trelegy and Albuterol  RTO in 3 week to recheck  - predniSONE (DELTASONE) 20 MG tablet; Take 3 tabs daily for 1 week, then 2 tabs for 1 week, then 1 tab for one week  Dispense: 42 tablet; Refill: 0 - doxycycline (VIBRA-TABS) 100 MG tablet; Take 1 tablet (100 mg total) by mouth 2 (two) times daily.  Dispense: 20 tablet; Refill: 0    Labs pending Health Maintenance reviewed Diet and exercise encouraged  Follow up plan: 3 weeks to recheck COPD and balance issues

## 2020-02-01 NOTE — Patient Instructions (Signed)

## 2020-02-02 LAB — CBC WITH DIFFERENTIAL/PLATELET
Basophils Absolute: 0.1 10*3/uL (ref 0.0–0.2)
Basos: 1 %
EOS (ABSOLUTE): 0.4 10*3/uL (ref 0.0–0.4)
Eos: 5 %
Hematocrit: 46.9 % (ref 37.5–51.0)
Hemoglobin: 16 g/dL (ref 13.0–17.7)
Immature Grans (Abs): 0 10*3/uL (ref 0.0–0.1)
Immature Granulocytes: 0 %
Lymphocytes Absolute: 3.2 10*3/uL — ABNORMAL HIGH (ref 0.7–3.1)
Lymphs: 37 %
MCH: 31.4 pg (ref 26.6–33.0)
MCHC: 34.1 g/dL (ref 31.5–35.7)
MCV: 92 fL (ref 79–97)
Monocytes Absolute: 0.6 10*3/uL (ref 0.1–0.9)
Monocytes: 7 %
Neutrophils Absolute: 4.4 10*3/uL (ref 1.4–7.0)
Neutrophils: 50 %
Platelets: 205 10*3/uL (ref 150–450)
RBC: 5.09 x10E6/uL (ref 4.14–5.80)
RDW: 13 % (ref 11.6–15.4)
WBC: 8.8 10*3/uL (ref 3.4–10.8)

## 2020-02-02 LAB — CMP14+EGFR
ALT: 22 IU/L (ref 0–44)
AST: 21 IU/L (ref 0–40)
Albumin/Globulin Ratio: 1.9 (ref 1.2–2.2)
Albumin: 4.1 g/dL (ref 3.8–4.9)
Alkaline Phosphatase: 70 IU/L (ref 44–121)
BUN/Creatinine Ratio: 21 — ABNORMAL HIGH (ref 9–20)
BUN: 17 mg/dL (ref 6–24)
Bilirubin Total: <0.2 mg/dL (ref 0.0–1.2)
CO2: 25 mmol/L (ref 20–29)
Calcium: 9.2 mg/dL (ref 8.7–10.2)
Chloride: 104 mmol/L (ref 96–106)
Creatinine, Ser: 0.82 mg/dL (ref 0.76–1.27)
GFR calc Af Amer: 117 mL/min/{1.73_m2} (ref >59)
GFR calc non Af Amer: 101 mL/min/{1.73_m2} (ref >59)
Globulin, Total: 2.2 g/dL (ref 1.5–4.5)
Glucose: 184 mg/dL — ABNORMAL HIGH (ref 65–99)
Potassium: 4.3 mmol/L (ref 3.5–5.2)
Sodium: 142 mmol/L (ref 134–144)
Total Protein: 6.3 g/dL (ref 6.0–8.5)

## 2020-02-27 ENCOUNTER — Telehealth: Payer: Self-pay

## 2020-02-27 NOTE — Telephone Encounter (Signed)
Attempted to contact patient - NVM 

## 2020-02-27 NOTE — Telephone Encounter (Signed)
Unsure what medications he needs refilled? He need to follow up pain management.

## 2020-02-27 NOTE — Telephone Encounter (Signed)
Please advise if patient ntbs?

## 2020-02-27 NOTE — Telephone Encounter (Signed)
Pt called requesting that Christy call in refills for his Sciatic Nerve Rx.   Please send to [facility].

## 2020-03-01 ENCOUNTER — Other Ambulatory Visit: Payer: Self-pay | Admitting: Family

## 2020-03-03 NOTE — Telephone Encounter (Signed)
Attempted to contact patient - NA  No call back   This encounter will be closed  

## 2020-03-10 ENCOUNTER — Telehealth: Payer: Self-pay

## 2020-03-10 DIAGNOSIS — G8929 Other chronic pain: Secondary | ICD-10-CM

## 2020-03-10 DIAGNOSIS — M5440 Lumbago with sciatica, unspecified side: Secondary | ICD-10-CM

## 2020-03-11 NOTE — Telephone Encounter (Signed)
PT orders placed

## 2020-03-11 NOTE — Addendum Note (Signed)
Addended by: [name] A on: 03/11/2020 04:12 PM   Modules accepted: Orders

## 2020-03-11 NOTE — Telephone Encounter (Signed)
Patient said he has not done physical therapy and his appointment with the neurosurgeon is not until 04/22/20.

## 2020-03-11 NOTE — Telephone Encounter (Signed)
Did he see the Neurosurgeon? Has he done PT?

## 2020-03-11 NOTE — Telephone Encounter (Signed)
Patient aware.

## 2020-03-14 ENCOUNTER — Encounter: Payer: Self-pay | Admitting: Family

## 2020-03-14 ENCOUNTER — Other Ambulatory Visit: Payer: Self-pay

## 2020-03-14 ENCOUNTER — Ambulatory Visit (INDEPENDENT_AMBULATORY_CARE_PROVIDER_SITE_OTHER): Payer: Medicare PPO | Admitting: Family

## 2020-03-14 ENCOUNTER — Telehealth: Payer: Self-pay

## 2020-03-14 VITALS — BP 113/93 | HR 111 | Temp 97.4°F | Ht 67.0 in | Wt 220.0 lb

## 2020-03-14 DIAGNOSIS — M5441 Lumbago with sciatica, right side: Secondary | ICD-10-CM

## 2020-03-14 DIAGNOSIS — Z91199 Patient's noncompliance with other medical treatment and regimen due to unspecified reason: Secondary | ICD-10-CM

## 2020-03-14 DIAGNOSIS — Z9119 Patient's noncompliance with other medical treatment and regimen: Secondary | ICD-10-CM | POA: Diagnosis not present

## 2020-03-14 DIAGNOSIS — G8929 Other chronic pain: Secondary | ICD-10-CM

## 2020-03-14 MED ORDER — KETOROLAC TROMETHAMINE 60 MG/2ML IM SOLN
60.0000 mg | Freq: Once | INTRAMUSCULAR | Status: AC
  Administered 2020-03-14: 60 mg via INTRAMUSCULAR

## 2020-03-14 MED ORDER — DICLOFENAC SODIUM 75 MG PO TBEC: 75.0000 mg | DELAYED_RELEASE_TABLET | Freq: Two times a day (BID) | ORAL | 0 refills | Status: DC

## 2020-03-14 NOTE — Telephone Encounter (Signed)
Patient called stating he need a referral to [facility] [address], [place]. Patient needs a new referral sent to have eyes checked.  Advised patient if appt has been under a year then he should not need referral

## 2020-03-14 NOTE — Progress Notes (Signed)
Subjective:    Patient ID: Charles Pennington, male    DOB: 03-04-1965, 55 y.o.   MRN: [id number]  Chief Complaint  Patient presents with  . Sciatica   Pt presents to the office today with chronic back pain with sciatica in his right leg. He has a MRI pending and Neurosurgeon 04/22/20. He has PT set up next week  He states his pain is a 7 out 10, but can flare up and been a 10 out 10.   He is taking belbuca 450 mcg, Norco 7.5-325 mg TID, baclofen, and lyrica 300 mg BID. He states these help slightly.He is followed by the Pain Clinic monthly.  Back Pain This is a chronic problem. The current episode started more than 1 year ago. The problem occurs intermittently. The pain is present in the lumbar spine and gluteal. The pain radiates to the right thigh. The pain is at a severity of 7/10. The pain is moderate. Associated symptoms include leg pain and weakness. He has tried muscle relaxant and analgesics for the symptoms. The treatment provided mild relief.      Review of Systems  Musculoskeletal: Positive for back pain.  Neurological: Positive for weakness.  All other systems reviewed and are negative.      Objective:   Physical Exam Vitals reviewed.  Constitutional:      General: He is not in acute distress.    Appearance: He is well-developed and well-nourished.  HENT:     Head: Normocephalic.     Mouth/Throat:     Mouth: Oropharynx is clear and moist.  Eyes:     General:        Right eye: No discharge.        Left eye: No discharge.     Pupils: Pupils are equal, round, and reactive to light.  Neck:     Thyroid: No thyromegaly.  Cardiovascular:     Rate and Rhythm: Normal rate and regular rhythm.     Pulses: Intact distal pulses.     Heart sounds: Normal heart sounds. No murmur heard.   Pulmonary:     Effort: Pulmonary effort is normal. No respiratory distress.     Breath sounds: Normal breath sounds. No wheezing.  Abdominal:     General: Bowel sounds are normal.  There is no distension.     Palpations: Abdomen is soft.     Tenderness: There is no abdominal tenderness.  Musculoskeletal:        General: No tenderness or edema.     Cervical back: Normal range of motion and neck supple.     Comments: Pain in lumbar with flexion, unable to do SLR because of pain  Skin:    General: Skin is warm and dry.     Findings: No erythema or rash.  Neurological:     Mental Status: He is alert and oriented to person, place, and time.     Cranial Nerves: No cranial nerve deficit.     Deep Tendon Reflexes: Reflexes are normal and symmetric.  Psychiatric:        Mood and Affect: Mood and affect normal.        Behavior: Behavior is hyperactive.        Thought Content: Thought content normal.        Judgment: Judgment normal.       BP (!) 113/93   Pulse (!) 111   Temp (!) 97.4 F (36.3 C) (Temporal)   Ht 5\' 7"  (1.702 m)  Wt 220 lb (99.8 kg)   BMI 34.46 kg/m      Assessment & Plan:  KRIKOR WILLET comes in today with chief complaint of Sciatica   Diagnosis and orders addressed:  1. Chronic right-sided low back pain with right-sided sciatica Continue medications After reviewing Epic, his Neurologists appt is 03/08/2. Not neurosurgeon. Will place new referral. MRI ordered.  Rest ROM exercises discussed RTO if symptoms worsen   - diclofenac (VOLTAREN) 75 MG EC tablet; Take 1 tablet (75 mg total) by mouth 2 (two) times daily.  Dispense: 30 tablet; Refill: 0 - ketorolac (TORADOL) injection 60 mg - MR Lumbar Spine Wo Contrast; Future - Ambulatory referral to Neurosurgery  2. Noncompliance     [name], FNP

## 2020-03-14 NOTE — Patient Instructions (Signed)
Sciatica Rehab Ask your health care provider which exercises are safe for you. Do exercises exactly as told by your health care provider and adjust them as directed. It is normal to feel mild stretching, pulling, tightness, or discomfort as you do these exercises. Stop right away if you feel sudden pain or your pain gets worse. Do not begin these exercises until told by your health care provider. Stretching and range-of-motion exercises These exercises warm up your muscles and joints and improve the movement and flexibility of your hips and back. These exercises also help to relieve pain, numbness, and tingling. Sciatic nerve glide 1. Sit in a chair with your head facing down toward your chest. Place your hands behind your back. Let your shoulders slump forward. 2. Slowly straighten one of your legs while you tilt your head back as if you are looking toward the ceiling. Only straighten your leg as far as you can without making your symptoms worse. 3. Hold this position for __________ seconds. 4. Slowly return to the starting position. 5. Repeat with your other leg. Repeat __________ times. Complete this exercise __________ times a day. Knee to chest with hip adduction and internal rotation 1. Lie on your back on a firm surface with both legs straight. 2. Bend one of your knees and move it up toward your chest until you feel a gentle stretch in your lower back and buttock. Then, move your knee toward the shoulder that is on the opposite side from your leg. This is hip adduction and internal rotation. ? Hold your leg in this position by holding on to the front of your knee. 3. Hold this position for __________ seconds. 4. Slowly return to the starting position. 5. Repeat with your other leg. Repeat __________ times. Complete this exercise __________ times a day.   Prone extension on elbows 1. Lie on your abdomen on a firm surface. A bed may be too soft for this exercise. 2. Prop yourself up on  your elbows. 3. Use your arms to help lift your chest up until you feel a gentle stretch in your abdomen and your lower back. ? This will place some of your body weight on your elbows. If this is uncomfortable, try stacking pillows under your chest. ? Your hips should stay down, against the surface that you are lying on. Keep your hip and back muscles relaxed. 4. Hold this position for __________ seconds. 5. Slowly relax your upper body and return to the starting position. Repeat __________ times. Complete this exercise __________ times a day.   Strengthening exercises These exercises build strength and endurance in your back. Endurance is the ability to use your muscles for a long time, even after they get tired. Pelvic tilt This exercise strengthens the muscles that lie deep in the abdomen. 1. Lie on your back on a firm surface. Bend your knees and keep your feet flat on the floor. 2. Tense your abdominal muscles. Tip your pelvis up toward the ceiling and flatten your lower back into the floor. ? To help with this exercise, you may place a small towel under your lower back and try to push your back into the towel. 3. Hold this position for __________ seconds. 4. Let your muscles relax completely before you repeat this exercise. Repeat __________ times. Complete this exercise __________ times a day. Alternating arm and leg raises 1. Get on your hands and knees on a firm surface. If you are on a hard floor, you may want to   use padding, such as an exercise mat, to cushion your knees. 2. Line up your arms and legs. Your hands should be directly below your shoulders, and your knees should be directly below your hips. 3. Lift your left leg behind you. At the same time, raise your right arm and straighten it in front of you. ? Do not lift your leg higher than your hip. ? Do not lift your arm higher than your shoulder. ? Keep your abdominal and back muscles tight. ? Keep your hips facing the  ground. ? Do not arch your back. ? Keep your balance carefully, and do not hold your breath. 4. Hold this position for __________ seconds. 5. Slowly return to the starting position. 6. Repeat with your right leg and your left arm. Repeat __________ times. Complete this exercise __________ times a day.   Posture and body mechanics Good posture and healthy body mechanics can help to relieve stress in your body's tissues and joints. Body mechanics refers to the movements and positions of your body while you do your daily activities. Posture is part of body mechanics. Good posture means:  Your spine is in its natural S-curve position (neutral).  Your shoulders are pulled back slightly.  Your head is not tipped forward. Follow these guidelines to improve your posture and body mechanics in your everyday activities. Standing  When standing, keep your spine neutral and your feet about hip width apart. Keep a slight bend in your knees. Your ears, shoulders, and hips should line up.  When you do a task in which you stand in one place for a long time, place one foot up on a stable object that is 2-4 inches (5-10 cm) high, such as a footstool. This helps keep your spine neutral.   Sitting  When sitting, keep your spine neutral and keep your feet flat on the floor. Use a footrest, if necessary, and keep your thighs parallel to the floor. Avoid rounding your shoulders, and avoid tilting your head forward.  When working at a desk or a computer, keep your desk at a height where your hands are slightly lower than your elbows. Slide your chair under your desk so you are close enough to maintain good posture.  When working at a computer, place your monitor at a height where you are looking straight ahead and you do not have to tilt your head forward or downward to look at the screen.   Resting  When lying down and resting, avoid positions that are most painful for you.  If you have pain with activities  such as sitting, bending, stooping, or squatting, lie in a position in which your body does not bend very much. For example, avoid curling up on your side with your arms and knees near your chest (fetal position).  If you have pain with activities such as standing for a long time or reaching with your arms, lie with your spine in a neutral position and bend your knees slightly. Try the following positions: ? Lying on your side with a pillow between your knees. ? Lying on your back with a pillow under your knees. Lifting  When lifting objects, keep your feet at least shoulder width apart and tighten your abdominal muscles.  Bend your knees and hips and keep your spine neutral. It is important to lift using the strength of your legs, not your back. Do not lock your knees straight out.  Always ask for help to lift heavy or awkward objects.     This information is not intended to replace advice given to you by your health care provider. Make sure you discuss any questions you have with your health care provider. Document Revised: 05/26/2018 Document Reviewed: 02/23/2018 Elsevier Patient Education  2021 [employer] Inc.  

## 2020-03-17 ENCOUNTER — Telehealth: Payer: Self-pay | Admitting: Family

## 2020-03-18 ENCOUNTER — Other Ambulatory Visit: Payer: Medicare PPO

## 2020-03-18 ENCOUNTER — Ambulatory Visit: Payer: Medicare PPO | Attending: Family | Admitting: Physical Therapy

## 2020-03-18 ENCOUNTER — Other Ambulatory Visit: Payer: Self-pay

## 2020-03-18 DIAGNOSIS — R293 Abnormal posture: Secondary | ICD-10-CM | POA: Insufficient documentation

## 2020-03-18 DIAGNOSIS — M5441 Lumbago with sciatica, right side: Secondary | ICD-10-CM | POA: Insufficient documentation

## 2020-03-18 DIAGNOSIS — G8929 Other chronic pain: Secondary | ICD-10-CM | POA: Diagnosis not present

## 2020-03-18 DIAGNOSIS — E039 Hypothyroidism, unspecified: Secondary | ICD-10-CM

## 2020-03-18 DIAGNOSIS — E1169 Type 2 diabetes mellitus with other specified complication: Secondary | ICD-10-CM

## 2020-03-18 DIAGNOSIS — E118 Type 2 diabetes mellitus with unspecified complications: Secondary | ICD-10-CM

## 2020-03-18 DIAGNOSIS — M545 Low back pain, unspecified: Secondary | ICD-10-CM | POA: Insufficient documentation

## 2020-03-18 DIAGNOSIS — I152 Hypertension secondary to endocrine disorders: Secondary | ICD-10-CM

## 2020-03-18 DIAGNOSIS — E785 Hyperlipidemia, unspecified: Secondary | ICD-10-CM

## 2020-03-18 DIAGNOSIS — E1159 Type 2 diabetes mellitus with other circulatory complications: Secondary | ICD-10-CM

## 2020-03-18 NOTE — Therapy (Signed)
[facility] [address] [place], [place], [address] Phone: [phone]   Fax:  [phone]  Physical Therapy Evaluation  Patient Details  Name: Charles Pennington MRN: [id number] Date of Birth: 05/26/1965 Referring Provider (PT): [name]   Encounter Date: 03/18/2020   PT End of Session - 03/18/20 0943    Visit Number 1    Number of Visits 12    Date for PT Re-Evaluation 04/29/20    PT Start Time 0905    PT Stop Time 0942    PT Time Calculation (min) 37 min    Activity Tolerance Patient tolerated treatment well    Behavior During Therapy [facility] for tasks assessed/performed           Past Medical History:  Diagnosis Date  . Anxiety   . Asthma   . Diabetes mellitus   . Diabetes mellitus without complication (HCC)   . Hypercholesteremia   . Hypertension   . Hypothyroidism   . Low back pain   . Obesity   . SOB (shortness of breath)   . Stroke [facility]) age 61  . Stroke (HCC)   . Vertigo     Past Surgical History:  Procedure Laterality Date  . BACK SURGERY  2013  . COLONOSCOPY  2009   Inflammatory changes of the cecum and ascending colon most consistent with infectious etiology, NSAID, ischemia. Suspected resolving infection based on symptomatology.  [facility] FOOT SURGERY     Dr Ulice Brilliant  . SHOULDER ARTHROSCOPY Right   . SPINE SURGERY    . WRIST SURGERY      There were no vitals filed for this visit.    Subjective Assessment - 03/18/20 0934    Subjective COVID-19 screen performed prior to patient entering clinic.  The patient returns to PT with continued c/o right buttock pain that is going down to the level of his knee.  His pain is very high today rated at a 10/10.  Prolonged sitting, standing and walking increases his pain. He reports multiple falls which he states is due to a stroke in the past and peripheral neuropathy.  It was highly recommended to him that he use a cane at all times.  He requested an electrical stimulation  treatment today as he found it effecteive last time.    Pertinent History DM, HTN, stroke, Back, shoulder, wrist and foot surgery.  Peripheral neuropathy.    How long can you stand comfortably? Varies.    How long can you walk comfortably? Short community distances.    Patient Stated Goals Get out of pain.    Currently in Pain? Yes    Pain Score 10-Worst pain ever    Pain Location Buttocks    Pain Orientation Right    Pain Descriptors / Indicators Aching;Sharp;Throbbing    Pain Type Chronic pain    Pain Frequency Constant    Aggravating Factors  See above.    Pain Relieving Factors Lying down.              [facility] PT Assessment - 03/18/20 0001      Assessment   Medical Diagnosis Chronic low back pain with scitica.    Referring Provider (PT) [name]    Onset Date/Surgical Date --   Aug/Sept 2021.     Precautions   Precautions Fall    Precaution Comments Supervised gait at all times.      Restrictions   Weight Bearing Restrictions No      Balance Screen  Has the patient fallen in the past 6 months Yes    How many times? 7-8.    Has the patient had a decrease in activity level because of a fear of falling?  No    Is the patient reluctant to leave their home because of a fear of falling?  No      Prior Function   Level of Independence Independent      Posture/Postural Control   Posture/Postural Control Postural limitations    Postural Limitations Rounded Shoulders;Forward head;Decreased lumbar lordosis      Deep Tendon Reflexes   DTR Assessment Site Patella    Patella DTR 1+    Achilles DTR 0      ROM / Strength   AROM / PROM / Strength AROM      AROM   Overall AROM Comments Active trunk flexion limited by 75% some of this appears limited due to balance issues.  Active lumbar extension to 5 degrees.      Strength   Overall Strength Comments Normal LE strength.      Palpation   Palpation comment Tender to palpation in right buttock region and especially  right ischial tuberosity and into his proximal hamstring region.      Special Tests   Other special tests Equal leg lengths.  Some pain reproduction with a right SLR.  No pain when performed on left. (+) Romberg test.      Ambulation/Gait   Gait Comments Antalgic gait pattern and rather ataxic in nature.  Highly recommend the patient use a cane at all times.                      Objective measurements completed on examination: See above findings.       [facility] Adult PT Treatment/Exercise - 03/18/20 0001      Modalities   Modalities Moist Heat;Electrical Stimulation      Moist Heat Therapy   Number Minutes Moist Heat 15 Minutes    Moist Heat Location --   Buttocks.     Programme researcher, broadcasting/film/video Location Right buttock/ischial tuberosity region.    Electrical Stimulation Action Pre-mod.    Electrical Stimulation Parameters 80-150 Hz x 15 minutes.    Electrical Stimulation Goals Tone;Pain                       PT Long Term Goals - 03/18/20 1053      PT LONG TERM GOAL #1   Title Independent with a HEP.    Time 6    Period Weeks    Status New      PT LONG TERM GOAL #2   Title Stand 20 minutes with pain not > 3/10.    Time 6    Period Weeks    Status New      PT LONG TERM GOAL #3   Title Sit 30 minutes with pain not > 3/10.    Time 6    Period Weeks    Status New      PT LONG TERM GOAL #4   Title Perform ADL's with pain not > 3/10.    Time 6    Period Weeks    Status New                  Plan - 03/18/20 1046    Clinical Impression Statement The patient presents to OPPT with continued c/o right buttock pain.  He  was found to be especially tender to palpation in the region of his right ischial tuberosity and proximal hamstrings.  His lumbar range of motion is limited and he c/o some pain reproduction with a right SLR test.  His gait is antalgic and ataxic in nature.  It was highly recommended to him thta he use a  cane at all times due to multiple falls.  He very much enjoyed the electrical stimulation treatment today with normal modality response following treatment.  Patient will benefit from skilled physical therapy intervention to address pain and deficits.    Personal Factors and Comorbidities Comorbidity 1;Comorbidity 3+;Comorbidity 2    Comorbidities DM, HTN, stroke, Back, shoulder, wrist and foot surgery, peripheral neuropathy.    Examination-Activity Limitations Locomotion Level;Other;Sit;Stand    Examination-Participation Restrictions Other    Stability/Clinical Decision Making Evolving/Moderate complexity    Clinical Decision Making Low    Rehab Potential Good    PT Frequency 2x / week    PT Duration 6 weeks    PT Treatment/Interventions ADLs/Self Care Home Management;Cryotherapy;Electrical Stimulation;Ultrasound;Moist Heat;Therapeutic activities;Therapeutic exercise;Manual techniques;Patient/family education;Passive range of motion    PT Next Visit Plan Combo e'stim/US to right ischial tuberosity region f/b STW/M to this are and proximal hamstrings.  Pain-free hamstring exercises (ie:  start with isometrics).    Consulted and Agree with Plan of Care Patient           Patient will benefit from skilled therapeutic intervention in order to improve the following deficits and impairments:  Abnormal gait,Increased muscle spasms,Decreased activity tolerance,Pain,Postural dysfunction,Decreased range of motion  Visit Diagnosis: Abnormal posture  Chronic right-sided low back pain with right-sided sciatica     Problem List Patient Active Problem List   Diagnosis Date Noted  . COPD with acute exacerbation (HCC) 01/23/2020  . Peripheral neuropathy 01/22/2020  . Diabetes mellitus with complication (HCC) 01/22/2020  . Substance abuse (HCC) 01/22/2020  . Sciatica 01/22/2020  . HLD (hyperlipidemia) 01/22/2020  . Acute respiratory failure with hypoxia (HCC) 01/22/2020  . Noncompliance with  medications 01/21/2017  . Acute bronchitis with COPD (HCC) 11/05/2016  . Duodenal ulcer 07/23/2016  . Cocaine abuse (HCC) 12/12/2015  . Pain medication agreement broken 12/12/2015  . Insomnia 10/28/2015  . Pain medication agreement signed 07/23/2015  . Morbid obesity (HCC) 06/26/2015  . Chronic back pain 06/23/2015  . Rupture of ulnar collateral ligament of thumb 03/01/2014  . Memory difficulties 08/15/2013  . Diabetic neuropathy associated with type 2 diabetes mellitus (HCC) 08/15/2013  . GERD (gastroesophageal reflux disease) 07/19/2013  . GAD (generalized anxiety disorder) 07/19/2013  . Depression 07/19/2013  . 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 Postlaminectomy syndrome, lumbar region 07/05/2011  . Hyperlipidemia associated with type 2 diabetes mellitus (HCC) 12/30/2009  . SMOKER 12/30/2009  . Colitis, acute 05/27/2008  . Hypothyroidism 05/23/2008  . Type 2 diabetes mellitus treated with insulin (HCC) 05/23/2008  . Hypertension associated with diabetes (HCC) 05/23/2008  . History of CVA (cerebrovascular accident) 05/23/2008  . FATTY LIVER DISEASE 05/23/2008  . ABDOMINAL PAIN 05/23/2008    Emilyrose Darrah, [place] MPT 03/18/2020, 10:58 AM  [facility] [address] [place], [place], [address] Phone: [phone]   Fax:  [phone]  Name: Charles Pennington MRN: [id number] Date of Birth: 1965-03-03

## 2020-03-18 NOTE — Addendum Note (Signed)
Addended by: Kaeleen Odom, [place] W on: 03/18/2020 11:21 AM   Modules accepted: Orders

## 2020-03-19 ENCOUNTER — Telehealth: Payer: Self-pay

## 2020-03-19 NOTE — Telephone Encounter (Signed)
Patient reports he went for his first physical therapy session yesterday.  He said he could not tell any difference but I explained to him it takes time and he should continue with the therapy sessions.  He also said he had an appointment with Neysa Bonito last week about his sciatica and he would like to know if she could send in Prednisone to help with his pain and to see if this would help.  Patient uses [employer].

## 2020-03-20 ENCOUNTER — Encounter: Payer: Self-pay | Admitting: Family

## 2020-03-20 ENCOUNTER — Other Ambulatory Visit: Payer: Self-pay | Admitting: Family

## 2020-03-20 ENCOUNTER — Ambulatory Visit (INDEPENDENT_AMBULATORY_CARE_PROVIDER_SITE_OTHER): Payer: Medicare PPO | Admitting: Family

## 2020-03-20 DIAGNOSIS — K219 Gastro-esophageal reflux disease without esophagitis: Secondary | ICD-10-CM

## 2020-03-20 DIAGNOSIS — M5441 Lumbago with sciatica, right side: Secondary | ICD-10-CM

## 2020-03-20 DIAGNOSIS — G8929 Other chronic pain: Secondary | ICD-10-CM | POA: Diagnosis not present

## 2020-03-20 MED ORDER — PREDNISONE 10 MG (21) PO TBPK: ORAL_TABLET | ORAL | 0 refills | Status: DC

## 2020-03-20 NOTE — Telephone Encounter (Signed)
See telephone visit.

## 2020-03-20 NOTE — Progress Notes (Signed)
Virtual Visit via telephone Note Due to COVID-19 pandemic this visit was conducted virtually. This visit type was conducted due to national recommendations for restrictions regarding the COVID-19 Pandemic (e.g. social distancing, sheltering in place) in an effort to limit this patient's exposure and mitigate transmission in our community. All issues noted in this document were discussed and addressed.  A physical exam was not performed with this format.  I connected with Charles Pennington on 03/20/20 at 12:59 pm  by telephone and verified that I am speaking with the correct person using two identifiers. Charles Pennington is currently located at home and no one is currently with him  during visit. The provider, [name], FNP is located in their office at time of visit.  I discussed the limitations, risks, security and privacy concerns of performing an evaluation and management service by telephone and the availability of in person appointments. I also discussed with the patient that there may be a patient responsible charge related to this service. The patient expressed understanding and agreed to proceed.   History and Present Illness:  PT calls the office today with worsen sciatica pain. He was seen on 03/14/20 and given diclofenac 75 mg BID and a ketorolac injection. He started PT.   He was told to continue his belbuca 450 mcg, Norco 7.5-325 mg TID, baclofen, and lyrica 300 mg BID and  Pain Clinic monthly.   We ordered MRI which is scheduled 03/27/20 and referral to Neurosurgeon.  Back Pain This is a chronic problem. The current episode started more than 1 year ago. The problem occurs intermittently. The problem has been waxing and waning since onset. The pain is present in the gluteal and lumbar spine. The quality of the pain is described as aching and shooting. The pain radiates to the right thigh. The pain is at a severity of 10/10. The pain is severe. The symptoms are aggravated by  standing, twisting and bending. Associated symptoms include leg pain, numbness and weakness. Pertinent negatives include no paresthesias or pelvic pain. He has tried bed rest, analgesics, heat, muscle relaxant and NSAIDs for the symptoms. The treatment provided mild relief.      Review of Systems  Genitourinary: Negative for pelvic pain.  Musculoskeletal: Positive for back pain.  Neurological: Positive for weakness and numbness. Negative for paresthesias.  All other systems reviewed and are negative.    Observations/Objective: No SOB or distress noted   Assessment and Plan: 1. Chronic right-sided low back pain with right-sided sciatica Keep MRI and Neurosurgeon appt Continue PT Continue all other medications Strict low carb diet Call if symptoms worsen or do not improve  - predniSONE (STERAPRED UNI-PAK 21 TAB) 10 MG (21) TBPK tablet; Use as directed  Dispense: 21 tablet; Refill: 0       I discussed the assessment and treatment plan with the patient. The patient was provided an opportunity to ask questions and all were answered. The patient agreed with the plan and demonstrated an understanding of the instructions.   The patient was advised to call back or seek an in-person evaluation if the symptoms worsen or if the condition fails to improve as anticipated.  The above assessment and management plan was discussed with the patient. The patient verbalized understanding of and has agreed to the management plan. Patient is aware to call the clinic if symptoms persist or worsen. Patient is aware when to return to the clinic for a follow-up visit. Patient educated on when it is appropriate  to go to the emergency department.   Time call ended: 1:10 pm    I provided 11 minutes of non-face-to-face time during this encounter.    [name], FNP

## 2020-03-24 ENCOUNTER — Ambulatory Visit: Payer: Medicare PPO | Admitting: Physical Therapy

## 2020-03-24 DIAGNOSIS — M5136 Other intervertebral disc degeneration, lumbar region: Secondary | ICD-10-CM | POA: Diagnosis not present

## 2020-03-24 DIAGNOSIS — G894 Chronic pain syndrome: Secondary | ICD-10-CM | POA: Diagnosis not present

## 2020-03-24 DIAGNOSIS — Z79899 Other long term (current) drug therapy: Secondary | ICD-10-CM | POA: Diagnosis not present

## 2020-03-26 ENCOUNTER — Ambulatory Visit: Payer: Medicare PPO | Admitting: Physical Therapy

## 2020-03-27 ENCOUNTER — Ambulatory Visit (HOSPITAL_COMMUNITY): Payer: Medicare PPO

## 2020-03-28 ENCOUNTER — Ambulatory Visit: Payer: Medicare PPO | Admitting: Physical Therapy

## 2020-03-28 ENCOUNTER — Other Ambulatory Visit: Payer: Self-pay

## 2020-03-28 ENCOUNTER — Encounter: Payer: Self-pay | Admitting: Physical Therapy

## 2020-03-28 DIAGNOSIS — M5441 Lumbago with sciatica, right side: Secondary | ICD-10-CM | POA: Diagnosis not present

## 2020-03-28 DIAGNOSIS — R293 Abnormal posture: Secondary | ICD-10-CM

## 2020-03-28 DIAGNOSIS — G8929 Other chronic pain: Secondary | ICD-10-CM | POA: Diagnosis not present

## 2020-03-28 DIAGNOSIS — M545 Low back pain, unspecified: Secondary | ICD-10-CM | POA: Diagnosis not present

## 2020-03-28 NOTE — Therapy (Signed)
[facility] [address] [place], [place], [address] Phone: [phone]   Fax:  [phone]  Physical Therapy Treatment  Patient Details  Name: Charles Pennington MRN: [id number] Date of Birth: 02-Sep-1965 Referring Provider (PT): [name]   Encounter Date: 03/28/2020   PT End of Session - 03/28/20 0820    Visit Number 2    Number of Visits 12    Date for PT Re-Evaluation 04/29/20    PT Start Time 0818    PT Stop Time 0857    PT Time Calculation (min) 39 min    Activity Tolerance Patient tolerated treatment well    Behavior During Therapy [facility] for tasks assessed/performed           Past Medical History:  Diagnosis Date  . Anxiety   . Asthma   . Diabetes mellitus   . Diabetes mellitus without complication (HCC)   . Hypercholesteremia   . Hypertension   . Hypothyroidism   . Low back pain   . Obesity   . SOB (shortness of breath)   . Stroke [facility]) age 86  . Stroke (HCC)   . Vertigo     Past Surgical History:  Procedure Laterality Date  . BACK SURGERY  2013  . COLONOSCOPY  2009   Inflammatory changes of the cecum and ascending colon most consistent with infectious etiology, NSAID, ischemia. Suspected resolving infection based on symptomatology.  [facility] FOOT SURGERY     Dr Ulice Brilliant  . SHOULDER ARTHROSCOPY Right   . SPINE SURGERY    . WRIST SURGERY      There were no vitals filed for this visit.   Subjective Assessment - 03/28/20 0817    Subjective COVID-19 screen performed prior to patient entering clinic.  Pain is predominately at proximal HS and has MRI today. Pain is now at posterior knee region. Reports he has been moving.    Pertinent History DM, HTN, stroke, Back, shoulder, wrist and foot surgery.  Peripheral neuropathy.    How long can you stand comfortably? Varies.    How long can you walk comfortably? Short community distances.    Patient Stated Goals Get out of pain.    Currently in Pain? Yes    Pain Score 6      Pain Location Leg    Pain Orientation Right;Posterior    Pain Descriptors / Indicators Aching;Sharp    Pain Type Chronic pain    Pain Onset More than a month ago    Pain Frequency Constant              [facility] PT Assessment - 03/28/20 0001      Assessment   Medical Diagnosis Chronic low back pain with scitica.    Referring Provider (PT) [name]      Precautions   Precautions Fall    Precaution Comments Supervised gait at all times.      Restrictions   Weight Bearing Restrictions No                         [facility] Adult PT Treatment/Exercise - 03/28/20 0001      Exercises   Exercises Lumbar;Knee/Hip      Lumbar Exercises: Stretches   Passive Hamstring Stretch Right;3 reps;30 seconds    Single Knee to Chest Stretch Right;3 reps;30 seconds    Figure 4 Stretch 3 reps;30 seconds;Supine;With overpressure      Knee/Hip Exercises: Standing   Rocker Board 3 minutes  Knee/Hip Exercises: Supine   Other Supine Knee/Hip Exercises HS isometric 5 sec holds x20 reps      Modalities   Modalities Electrical Stimulation      Electrical Stimulation   Electrical Stimulation Location R buttock/ prox. HS    Electrical Stimulation Action Pre-Mod    Electrical Stimulation Parameters 80-150 hz x10 min    Electrical Stimulation Goals Tone;Pain      Manual Therapy   Manual Therapy Soft tissue mobilization    Soft tissue mobilization STW to R buttock/ HS to reduce tone and pain                       PT Long Term Goals - 03/28/20 0076      PT LONG TERM GOAL #1   Title Independent with a HEP.    Time 6    Period Weeks    Status On-going      PT LONG TERM GOAL #2   Title Stand 20 minutes with pain not > 3/10.    Time 6    Period Weeks    Status On-going      PT LONG TERM GOAL #3   Title Sit 30 minutes with pain not > 3/10.    Time 6    Period Weeks    Status On-going      PT LONG TERM GOAL #4   Title Perform ADL's with pain not >  3/10.    Time 6    Period Weeks    Status On-going                 Plan - 03/28/20 0849    Clinical Impression Statement Patient presented in clinic with reports of continued R buttock/HS pain with intermittant pain to R posterior knee. Patient does have peripheral neuropathy in PMH. Patient denies any pain experienced in his lumbar spine but to have an MRI today. Patient guided through light stretching today with reports of discomfort/stretch at end range. Minimal increased tone palpable in R buttock/proximal HS but tenderness reported with buttock/ HS insertion. Normal modalities response noted following removal of the modalities. No AD used by patient today.    Personal Factors and Comorbidities Comorbidity 1;Comorbidity 3+;Comorbidity 2    Comorbidities DM, HTN, stroke, Back, shoulder, wrist and foot surgery, peripheral neuropathy.    Examination-Activity Limitations Locomotion Level;Other;Sit;Stand    Examination-Participation Restrictions Other    Stability/Clinical Decision Making Evolving/Moderate complexity    Rehab Potential Good    PT Frequency 2x / week    PT Duration 6 weeks    PT Treatment/Interventions ADLs/Self Care Home Management;Cryotherapy;Electrical Stimulation;Ultrasound;Moist Heat;Therapeutic activities;Therapeutic exercise;Manual techniques;Patient/family education;Passive range of motion    PT Next Visit Plan Combo e'stim/US to right ischial tuberosity region f/b STW/M to this are and proximal hamstrings.  Pain-free hamstring exercises (ie:  start with isometrics).    Consulted and Agree with Plan of Care Patient           Patient will benefit from skilled therapeutic intervention in order to improve the following deficits and impairments:  Abnormal gait,Increased muscle spasms,Decreased activity tolerance,Pain,Postural dysfunction,Decreased range of motion  Visit Diagnosis: Abnormal posture  Chronic right-sided low back pain with right-sided  sciatica     Problem List Patient Active Problem List   Diagnosis Date Noted  . COPD with acute exacerbation (HCC) 01/23/2020  . Peripheral neuropathy 01/22/2020  . Diabetes mellitus with complication (HCC) 01/22/2020  . Substance abuse (HCC) 01/22/2020  . Sciatica  01/22/2020  . HLD (hyperlipidemia) 01/22/2020  . Acute respiratory failure with hypoxia (HCC) 01/22/2020  . Noncompliance with medications 01/21/2017  . Acute bronchitis with COPD (HCC) 11/05/2016  . Duodenal ulcer 07/23/2016  . Cocaine abuse (HCC) 12/12/2015  . Pain medication agreement broken 12/12/2015  . Insomnia 10/28/2015  . Pain medication agreement signed 07/23/2015  . Morbid obesity (HCC) 06/26/2015  . Chronic back pain 06/23/2015  . Rupture of ulnar collateral ligament of thumb 03/01/2014  . Memory difficulties 08/15/2013  . Diabetic neuropathy associated with type 2 diabetes mellitus (HCC) 08/15/2013  . GERD (gastroesophageal reflux disease) 07/19/2013  . GAD (generalized anxiety disorder) 07/19/2013  . Depression 07/19/2013  . 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 Postlaminectomy syndrome, lumbar region 07/05/2011  . Hyperlipidemia associated with type 2 diabetes mellitus (HCC) 12/30/2009  . SMOKER 12/30/2009  . Colitis, acute 05/27/2008  . Hypothyroidism 05/23/2008  . Type 2 diabetes mellitus treated with insulin (HCC) 05/23/2008  . Hypertension associated with diabetes (HCC) 05/23/2008  . History of CVA (cerebrovascular accident) 05/23/2008  . FATTY LIVER DISEASE 05/23/2008  . ABDOMINAL PAIN 05/23/2008    Marvell Fuller, PTA 03/28/2020, 9:08 AM  [facility] [address] [place], [place], [address] Phone: [phone]   Fax:  [phone]  Name: LADAMIEN RAMMEL MRN: [id number] Date of Birth: 1965-03-28

## 2020-03-31 ENCOUNTER — Ambulatory Visit: Payer: Medicare PPO | Admitting: Physical Therapy

## 2020-04-01 ENCOUNTER — Ambulatory Visit: Payer: Medicare PPO | Admitting: *Deleted

## 2020-04-01 ENCOUNTER — Other Ambulatory Visit: Payer: Medicare PPO

## 2020-04-01 ENCOUNTER — Other Ambulatory Visit: Payer: Self-pay

## 2020-04-01 ENCOUNTER — Telehealth: Payer: Self-pay

## 2020-04-01 DIAGNOSIS — E118 Type 2 diabetes mellitus with unspecified complications: Secondary | ICD-10-CM | POA: Diagnosis not present

## 2020-04-01 DIAGNOSIS — E039 Hypothyroidism, unspecified: Secondary | ICD-10-CM | POA: Diagnosis not present

## 2020-04-01 DIAGNOSIS — I152 Hypertension secondary to endocrine disorders: Secondary | ICD-10-CM

## 2020-04-01 DIAGNOSIS — R293 Abnormal posture: Secondary | ICD-10-CM | POA: Diagnosis not present

## 2020-04-01 DIAGNOSIS — M545 Low back pain, unspecified: Secondary | ICD-10-CM | POA: Diagnosis not present

## 2020-04-01 DIAGNOSIS — E1169 Type 2 diabetes mellitus with other specified complication: Secondary | ICD-10-CM | POA: Diagnosis not present

## 2020-04-01 DIAGNOSIS — G8929 Other chronic pain: Secondary | ICD-10-CM | POA: Diagnosis not present

## 2020-04-01 DIAGNOSIS — E785 Hyperlipidemia, unspecified: Secondary | ICD-10-CM

## 2020-04-01 DIAGNOSIS — M5441 Lumbago with sciatica, right side: Secondary | ICD-10-CM | POA: Diagnosis not present

## 2020-04-01 DIAGNOSIS — E1159 Type 2 diabetes mellitus with other circulatory complications: Secondary | ICD-10-CM | POA: Diagnosis not present

## 2020-04-01 LAB — BAYER DCA HB A1C WAIVED: HB A1C (BAYER DCA - WAIVED): 7.9 % — ABNORMAL HIGH (ref <7.0)

## 2020-04-01 NOTE — Telephone Encounter (Signed)
Patient states that the prednisone has not helped his sciatica pain.  Patient states he wants something else sent to [facility] pharmacy.

## 2020-04-01 NOTE — Telephone Encounter (Signed)
MR scheduled for 04/09/20. Then we can do referral to Neurosurgeon. I can not given more prednisone. Continue PT.

## 2020-04-01 NOTE — Therapy (Signed)
[facility] [address] [place], [place], [address] Phone: [phone]   Fax:  [phone]  Physical Therapy Treatment  Patient Details  Name: Charles Pennington MRN: [id number] Date of Birth: 1966-01-29 Referring Provider (PT): [name]   Encounter Date: 04/01/2020   PT End of Session - 04/01/20 0949    Visit Number 3    Number of Visits 12    Date for PT Re-Evaluation 04/29/20    PT Start Time 0945    PT Stop Time 1034    PT Time Calculation (min) 49 min           Past Medical History:  Diagnosis Date  . Anxiety   . Asthma   . Diabetes mellitus   . Diabetes mellitus without complication (HCC)   . Hypercholesteremia   . Hypertension   . Hypothyroidism   . Low back pain   . Obesity   . SOB (shortness of breath)   . Stroke [facility]) age 55  . Stroke (HCC)   . Vertigo     Past Surgical History:  Procedure Laterality Date  . BACK SURGERY  2013  . COLONOSCOPY  2009   Inflammatory changes of the cecum and ascending colon most consistent with infectious etiology, NSAID, ischemia. Suspected resolving infection based on symptomatology.  [facility] FOOT SURGERY     Dr Ulice Brilliant  . SHOULDER ARTHROSCOPY Right   . SPINE SURGERY    . WRIST SURGERY      There were no vitals filed for this visit.   Subjective Assessment - 04/01/20 0946    Subjective COVID-19 screen performed prior to patient entering clinic.  Missed my MRI appt. Rescheduled 04-09-20.    Pertinent History DM, HTN, stroke, Back, shoulder, wrist and foot surgery.  Peripheral neuropathy.    How long can you stand comfortably? Varies.    How long can you walk comfortably? Short community distances.    Patient Stated Goals Get out of pain.    Currently in Pain? Yes    Pain Score 6     Pain Location Leg    Pain Orientation Right;Posterior    Pain Descriptors / Indicators Aching;Sharp    Pain Type Chronic pain    Pain Onset More than a month ago                              [facility] Adult PT Treatment/Exercise - 04/01/20 0001      Exercises   Exercises Lumbar;Knee/Hip      Knee/Hip Exercises: Standing   Knee Flexion AROM;Right;3 sets;20 reps   standing RT HS curl   Rocker Board 3 minutes      Knee/Hip Exercises: Supine   Knee Flexion AROM;Right;3 sets;20 reps   HS curl     Modalities   Modalities Electrical Stimulation      Moist Heat Therapy   Number Minutes Moist Heat 15 Minutes    Moist Heat Location --   Buttocks.ischial tuberosity     Electrical Stimulation   Electrical Stimulation Location R buttock/ prox. HS    Electrical Stimulation Action premod    Electrical Stimulation Parameters 80-150 hz  x 15 mins    Electrical Stimulation Goals Tone;Pain      Manual Therapy   Manual Therapy Soft tissue mobilization    Soft tissue mobilization ischemic release and active release performed to RT HS mm origin in prone  PT Long Term Goals - 03/28/20 9628      PT LONG TERM GOAL #1   Title Independent with a HEP.    Time 6    Period Weeks    Status On-going      PT LONG TERM GOAL #2   Title Stand 20 minutes with pain not > 3/10.    Time 6    Period Weeks    Status On-going      PT LONG TERM GOAL #3   Title Sit 30 minutes with pain not > 3/10.    Time 6    Period Weeks    Status On-going      PT LONG TERM GOAL #4   Title Perform ADL's with pain not > 3/10.    Time 6    Period Weeks    Status On-going                 Plan - 04/01/20 1002    Clinical Impression Statement Pt arrived today doing ok 6/10 pain, but complied about increased pain after performing stretching last Rx. Rx today focused on increased blood flow with AROM HS curls in standing and prone and did well. Manual therapy with ischemic release and active release to proximal HS at ischial tuberosity    Personal Factors and Comorbidities Comorbidity 1;Comorbidity 3+;Comorbidity 2    Comorbidities  DM, HTN, stroke, Back, shoulder, wrist and foot surgery, peripheral neuropathy.    Stability/Clinical Decision Making Evolving/Moderate complexity    PT Frequency 2x / week    PT Duration 6 weeks    PT Treatment/Interventions ADLs/Self Care Home Management;Cryotherapy;Electrical Stimulation;Ultrasound;Moist Heat;Therapeutic activities;Therapeutic exercise;Manual techniques;Patient/family education;Passive range of motion    PT Next Visit Plan Combo e'stim/US to right ischial tuberosity region f/b STW/M to this are and proximal hamstrings.  Pain-free hamstring exercises (ie:  start with isometrics).    Consulted and Agree with Plan of Care Patient           Patient will benefit from skilled therapeutic intervention in order to improve the following deficits and impairments:  Abnormal gait,Increased muscle spasms,Decreased activity tolerance,Pain,Postural dysfunction,Decreased range of motion  Visit Diagnosis: Chronic right-sided low back pain with right-sided sciatica  Abnormal posture  Acute right-sided low back pain without sciatica     Problem List Patient Active Problem List   Diagnosis Date Noted  . COPD with acute exacerbation (HCC) 01/23/2020  . Peripheral neuropathy 01/22/2020  . Diabetes mellitus with complication (HCC) 01/22/2020  . Substance abuse (HCC) 01/22/2020  . Sciatica 01/22/2020  . HLD (hyperlipidemia) 01/22/2020  . Acute respiratory failure with hypoxia (HCC) 01/22/2020  . Noncompliance with medications 01/21/2017  . Acute bronchitis with COPD (HCC) 11/05/2016  . Duodenal ulcer 07/23/2016  . Cocaine abuse (HCC) 12/12/2015  . Pain medication agreement broken 12/12/2015  . Insomnia 10/28/2015  . Pain medication agreement signed 07/23/2015  . Morbid obesity (HCC) 06/26/2015  . Chronic back pain 06/23/2015  . Rupture of ulnar collateral ligament of thumb 03/01/2014  . Memory difficulties 08/15/2013  . Diabetic neuropathy associated with type 2 diabetes  mellitus (HCC) 08/15/2013  . GERD (gastroesophageal reflux disease) 07/19/2013  . GAD (generalized anxiety disorder) 07/19/2013  . Depression 07/19/2013  . 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 Postlaminectomy syndrome, lumbar region 07/05/2011  . Hyperlipidemia associated with type 2 diabetes mellitus (HCC) 12/30/2009  . SMOKER 12/30/2009  . Colitis, acute 05/27/2008  . Hypothyroidism 05/23/2008  . Type 2 diabetes mellitus treated with insulin (  HCC) 05/23/2008  . Hypertension associated with diabetes (HCC) 05/23/2008  . History of CVA (cerebrovascular accident) 05/23/2008  . FATTY LIVER DISEASE 05/23/2008  . ABDOMINAL PAIN 05/23/2008    Nathali Vent,CHRIS, PTA 04/01/2020, 10:47 AM  [facility] [address] [place], [place], [address] Phone: [phone]   Fax:  [phone]  Name: LILLIAN TIGGES MRN: [id number] Date of Birth: 1965-03-05

## 2020-04-02 LAB — CMP14+EGFR
ALT: 20 IU/L (ref 0–44)
AST: 20 IU/L (ref 0–40)
Albumin/Globulin Ratio: 2.1 (ref 1.2–2.2)
Albumin: 4.6 g/dL (ref 3.8–4.9)
Alkaline Phosphatase: 85 IU/L (ref 44–121)
BUN/Creatinine Ratio: 17 (ref 9–20)
BUN: 17 mg/dL (ref 6–24)
Bilirubin Total: 0.3 mg/dL (ref 0.0–1.2)
CO2: 24 mmol/L (ref 20–29)
Calcium: 10 mg/dL (ref 8.7–10.2)
Chloride: 97 mmol/L (ref 96–106)
Creatinine, Ser: 1.01 mg/dL (ref 0.76–1.27)
GFR calc Af Amer: 98 mL/min/{1.73_m2} (ref >59)
GFR calc non Af Amer: 85 mL/min/{1.73_m2} (ref >59)
Globulin, Total: 2.2 g/dL (ref 1.5–4.5)
Glucose: 145 mg/dL — ABNORMAL HIGH (ref 65–99)
Potassium: 3.8 mmol/L (ref 3.5–5.2)
Sodium: 141 mmol/L (ref 134–144)
Total Protein: 6.8 g/dL (ref 6.0–8.5)

## 2020-04-02 LAB — CBC WITH DIFFERENTIAL/PLATELET
Basophils Absolute: 0.1 10*3/uL (ref 0.0–0.2)
Basos: 1 %
EOS (ABSOLUTE): 0.3 10*3/uL (ref 0.0–0.4)
Eos: 3 %
Hematocrit: 50.7 % (ref 37.5–51.0)
Hemoglobin: 17.2 g/dL (ref 13.0–17.7)
Immature Grans (Abs): 0 10*3/uL (ref 0.0–0.1)
Immature Granulocytes: 0 %
Lymphocytes Absolute: 4.6 10*3/uL — ABNORMAL HIGH (ref 0.7–3.1)
Lymphs: 40 %
MCH: 31.9 pg (ref 26.6–33.0)
MCHC: 33.9 g/dL (ref 31.5–35.7)
MCV: 94 fL (ref 79–97)
Monocytes Absolute: 0.7 10*3/uL (ref 0.1–0.9)
Monocytes: 6 %
Neutrophils Absolute: 5.8 10*3/uL (ref 1.4–7.0)
Neutrophils: 50 %
Platelets: 225 10*3/uL (ref 150–450)
RBC: 5.4 x10E6/uL (ref 4.14–5.80)
RDW: 13.5 % (ref 11.6–15.4)
WBC: 11.6 10*3/uL — ABNORMAL HIGH (ref 3.4–10.8)

## 2020-04-02 LAB — THYROID PANEL WITH TSH
Free Thyroxine Index: 1.7 (ref 1.2–4.9)
T3 Uptake Ratio: 26 % (ref 24–39)
T4, Total: 6.7 ug/dL (ref 4.5–12.0)
TSH: 2.16 u[IU]/mL (ref 0.450–4.500)

## 2020-04-02 LAB — LIPID PANEL
Chol/HDL Ratio: 3 ratio (ref 0.0–5.0)
Cholesterol, Total: 157 mg/dL (ref 100–199)
HDL: 52 mg/dL (ref >39)
LDL Chol Calc (NIH): 65 mg/dL (ref 0–99)
Triglycerides: 252 mg/dL — ABNORMAL HIGH (ref 0–149)
VLDL Cholesterol Cal: 40 mg/dL (ref 5–40)

## 2020-04-02 NOTE — Telephone Encounter (Signed)
Patient aware and verbalized understanding. °

## 2020-04-07 ENCOUNTER — Telehealth: Payer: Self-pay

## 2020-04-07 NOTE — Telephone Encounter (Signed)
Advised patient that the MRI was of the lower back and they should be able to assess all that area. Patient verbalized understanding

## 2020-04-09 ENCOUNTER — Other Ambulatory Visit: Payer: Self-pay

## 2020-04-09 ENCOUNTER — Ambulatory Visit (HOSPITAL_COMMUNITY)
Admission: RE | Admit: 2020-04-09 | Discharge: 2020-04-09 | Disposition: A | Discharge status: Home / Self Care | Payer: Medicare PPO | Source: Ambulatory Visit | Attending: Family | Admitting: Family

## 2020-04-09 DIAGNOSIS — M5441 Lumbago with sciatica, right side: Secondary | ICD-10-CM | POA: Diagnosis not present

## 2020-04-09 DIAGNOSIS — M5116 Intervertebral disc disorders with radiculopathy, lumbar region: Secondary | ICD-10-CM | POA: Diagnosis not present

## 2020-04-09 DIAGNOSIS — G8929 Other chronic pain: Secondary | ICD-10-CM | POA: Diagnosis not present

## 2020-04-09 DIAGNOSIS — M4316 Spondylolisthesis, lumbar region: Secondary | ICD-10-CM | POA: Diagnosis not present

## 2020-04-09 DIAGNOSIS — M48061 Spinal stenosis, lumbar region without neurogenic claudication: Secondary | ICD-10-CM | POA: Diagnosis not present

## 2020-04-09 DIAGNOSIS — M5117 Intervertebral disc disorders with radiculopathy, lumbosacral region: Secondary | ICD-10-CM | POA: Diagnosis not present

## 2020-04-10 ENCOUNTER — Telehealth: Payer: Self-pay | Admitting: Family Medicine

## 2020-04-10 ENCOUNTER — Telehealth: Payer: Self-pay

## 2020-04-10 NOTE — Telephone Encounter (Signed)
See result note.  

## 2020-04-10 NOTE — Telephone Encounter (Signed)
Ordered by PCP- please review and advise

## 2020-04-11 NOTE — Telephone Encounter (Signed)
Please call patient and tell him to set up a voicemail on his phone or give him number to call Neurosurgeon office to set up appointment.

## 2020-04-11 NOTE — Telephone Encounter (Signed)
Patient aware and states he will call back when he has something to write with to get the neurosurgeon's phone number.

## 2020-04-15 ENCOUNTER — Other Ambulatory Visit: Payer: Self-pay | Admitting: *Deleted

## 2020-04-15 DIAGNOSIS — F411 Generalized anxiety disorder: Secondary | ICD-10-CM

## 2020-04-15 DIAGNOSIS — F331 Major depressive disorder, recurrent, moderate: Secondary | ICD-10-CM

## 2020-04-15 MED ORDER — PAROXETINE HCL 40 MG PO TABS: 40.0000 mg | ORAL_TABLET | Freq: Every day | ORAL | 0 refills | Status: DC

## 2020-04-21 ENCOUNTER — Telehealth: Payer: Self-pay

## 2020-04-21 DIAGNOSIS — Z6832 Body mass index (BMI) 32.0-32.9, adult: Secondary | ICD-10-CM | POA: Diagnosis not present

## 2020-04-21 DIAGNOSIS — F17200 Nicotine dependence, unspecified, uncomplicated: Secondary | ICD-10-CM | POA: Diagnosis not present

## 2020-04-21 DIAGNOSIS — E119 Type 2 diabetes mellitus without complications: Secondary | ICD-10-CM | POA: Diagnosis not present

## 2020-04-21 DIAGNOSIS — E559 Vitamin D deficiency, unspecified: Secondary | ICD-10-CM | POA: Diagnosis not present

## 2020-04-21 DIAGNOSIS — F17210 Nicotine dependence, cigarettes, uncomplicated: Secondary | ICD-10-CM | POA: Diagnosis not present

## 2020-04-21 DIAGNOSIS — F1120 Opioid dependence, uncomplicated: Secondary | ICD-10-CM | POA: Diagnosis not present

## 2020-04-21 DIAGNOSIS — Z79899 Other long term (current) drug therapy: Secondary | ICD-10-CM | POA: Diagnosis not present

## 2020-04-21 DIAGNOSIS — M5136 Other intervertebral disc degeneration, lumbar region: Secondary | ICD-10-CM | POA: Diagnosis not present

## 2020-04-21 DIAGNOSIS — G894 Chronic pain syndrome: Secondary | ICD-10-CM | POA: Diagnosis not present

## 2020-04-21 NOTE — Telephone Encounter (Signed)
REFERRAL REQUEST Telephone Note  Have you been seen at our office for this problem? Yes and also had MRI  (Advise that they may need an appointment with their PCP before a referral can be done)  Reason for Referral: [facility] Referral discussed with patient: yes Best contact number of patient for referral team:   [phone] Has patient been seen by a specialist for this issue before: yes Patient provider preference for referral: Dr. Shon Baton Patient location preference for referral: [facility]    Patient notified that referrals can take up to a week or longer to process. If they haven't heard anything within a week they should call back and speak with the referral department.

## 2020-04-22 ENCOUNTER — Encounter: Payer: Self-pay | Admitting: Neurology

## 2020-04-22 ENCOUNTER — Ambulatory Visit: Payer: Medicare PPO | Admitting: Neurology

## 2020-04-24 ENCOUNTER — Other Ambulatory Visit: Payer: Self-pay | Admitting: Family

## 2020-04-24 DIAGNOSIS — G4700 Insomnia, unspecified: Secondary | ICD-10-CM

## 2020-04-25 DIAGNOSIS — M5459 Other low back pain: Secondary | ICD-10-CM | POA: Diagnosis not present

## 2020-05-06 ENCOUNTER — Other Ambulatory Visit: Payer: Self-pay

## 2020-05-06 ENCOUNTER — Ambulatory Visit (INDEPENDENT_AMBULATORY_CARE_PROVIDER_SITE_OTHER): Payer: Medicare PPO | Admitting: Family

## 2020-05-06 ENCOUNTER — Ambulatory Visit (INDEPENDENT_AMBULATORY_CARE_PROVIDER_SITE_OTHER): Payer: Medicare PPO

## 2020-05-06 ENCOUNTER — Encounter: Payer: Self-pay | Admitting: Family

## 2020-05-06 ENCOUNTER — Telehealth: Payer: Self-pay

## 2020-05-06 VITALS — BP 114/84 | HR 102 | Temp 97.5°F | Ht 67.0 in | Wt 217.0 lb

## 2020-05-06 DIAGNOSIS — Z01818 Encounter for other preprocedural examination: Secondary | ICD-10-CM | POA: Diagnosis not present

## 2020-05-06 DIAGNOSIS — J449 Chronic obstructive pulmonary disease, unspecified: Secondary | ICD-10-CM | POA: Diagnosis not present

## 2020-05-06 DIAGNOSIS — G4700 Insomnia, unspecified: Secondary | ICD-10-CM

## 2020-05-06 LAB — BAYER DCA HB A1C WAIVED: HB A1C (BAYER DCA - WAIVED): 8.5 % — ABNORMAL HIGH (ref <7.0)

## 2020-05-06 MED ORDER — ZOLPIDEM TARTRATE 10 MG PO TABS: 10.0000 mg | ORAL_TABLET | Freq: Every day | ORAL | 5 refills | Status: DC

## 2020-05-06 NOTE — Telephone Encounter (Signed)
  Prescription Request  05/06/2020  What is the name of the medication or equipment? Zolpidem 10 mg Patient had appt with Christy this morning and asked her to call it in and hadn't been called in yet  Have you contacted your pharmacy to request a refill? (if applicable) NO  Which pharmacy would you like this sent to? [facility]   Patient notified that their request is being sent to the clinical staff for review and that they should receive a response within 2 business days.

## 2020-05-06 NOTE — Patient Instructions (Signed)
Spinal Fusion, Adult, Care After This sheet gives you information about how to care for yourself after your procedure. Your health care provider may also give you more specific instructions. If you have problems or questions, contact your health care provider. What can I expect after the procedure? After the procedure, it is common to have:  Back pain and stiffness.  Pain in the incision area. Follow these instructions at home: Medicines  Take over-the-counter and prescription medicines only as told by your health care provider. These include any pain medicines or blood-thinning medicines (anticoagulants).  If you were prescribed an antibiotic medicine, take it as told by your health care provider. Do not stop taking the antibiotic even if you start to feel better.  Ask your health care provider if the medicine prescribed to you: ? Requires you to avoid driving or using machinery. ? Can cause constipation. You may need to take these actions to prevent or treat constipation:  Drink enough fluid to keep your urine pale yellow.  Take over-the-counter or prescription medicines.  Eat foods that are high in fiber, such as beans, whole grains, and fresh fruits and vegetables.  Limit foods that are high in fat and processed sugars, such as fried or sweet foods.  If you are taking blood thinners: ? Talk with your health care provider before you take any medicines that contain aspirin or NSAIDs, such as ibuprofen. These medicines increase your risk for dangerous bleeding. ? Take your medicine exactly as told, at the same time every day. ? Avoid activities that could cause injury or bruising, and follow instructions about how to prevent falls. ? Wear a medical alert bracelet or carry a card that lists what medicines you take.   If you have a brace:  Wear the brace as told by your health care provider. Remove it only as told by your health care provider.  Check the skin around the brace every  day. Tell your doctor if you have any concerns.  Loosen the brace if your legs or toes tingle, become numb, or turn cold and blue.  Keep the brace clean.  If the brace is not waterproof: ? Do not let it get wet. ? Cover it with a watertight covering when you take a bath or shower. Managing pain, stiffness, and swelling  If directed, put ice on the affected area. To do this: ? If you have a removable brace, remove it as told by your health care provider. ? Put ice in a plastic bag. ? Place a towel between your skin and the bag. ? Leave the ice on for 20 minutes, 2-3 times a day. ? Remove the ice if your skin turns bright red. This is very important. If you cannot feel pain, heat, or cold, you have a greater risk of damage to the area. Incision care  Follow instructions from your health care provider about how to take care of your incision. Make sure you: ? Wash your hands with soap and water for at least 20 seconds before and after you change your bandage (dressing). If soap and water are not available, use hand sanitizer. ? Change your dressing as told by your health care provider. ? Leave stitches (sutures), skin glue, or adhesive strips in place. These skin closures may need to be in place for 2 weeks or longer. If adhesive strip edges start to loosen and curl up, you may trim the loose edges. Do not remove adhesive strips completely unless your health care provider  tells you to do that.  Keep your incision clean and dry.  Do not take baths, swim, or use a hot tub until your health care provider approves. Ask your health care provider if you may take showers. You may only be allowed to take sponge baths.  Check your incision area every day for signs of infection. Check for: ? More redness, swelling, or pain. ? Fluid or blood. ? Warmth. ? Pus or a bad smell.   Activity  Rest as told by your health care provider.  Avoid sitting for a long time without moving. Get up to take short  walks every 1-2 hours. This is important to improve blood flow and breathing. Ask for help if you feel weak or unsteady.  Follow instructions from your health care provider about how to move and use good posture to help your spine heal.  Do not lift anything at all, or anything that is heavier than the limit you are told, until your health care provider says that it is safe.  Do not twist or bend at the waist until your health care provider approves.  Protect your back as told by your health care provider. Avoid the following: ? Pushing and pulling motions. ? Lifting anything over your head. ? Sitting or lying down in the same position for long periods of time.  Do exercises as told by your health care provider.   General instructions  Do not drive until your health care provider approves.  Wear compression stockings as told by your health care provider. These stockings help to prevent blood clots and reduce swelling in your legs.  Do not remove your drain. Follow instructions from your health care provider about how to take care of your drain.  Do not use any products that contain nicotine or tobacco, such as cigarettes, e-cigarettes, and chewing tobacco. These can delay bone healing after surgery. If you need help quitting, ask your health care provider.  Keep all follow-up visits. This is important. Contact a health care provider if:  You have pain that gets worse or does not get better with medicine.  Your legs become painful, swollen, red, or warm to the touch.  You have any of these signs of infection: ? More redness, swelling, or pain around your incision. ? Fluid or blood coming from your incision. ? Warmth coming from your incision. ? Pus or a bad smell coming from your incision. ? A fever.  You vomit or feel nauseous.  You have weakness or numbness in your legs that is new or getting worse.  You have trouble controlling urination or bowel movements. Get help right  away if you have:  Severe pain.  A headache that is worse when you are sitting or standing.  Chest pain.  Trouble breathing. These symptoms may represent a serious problem that is an emergency. Do not wait to see if the symptoms will go away. Get medical help right away. Call your local emergency services (911 in the [place] Do not drive yourself to the hospital. Summary  After the procedure, it is common to have back pain and pain in the incision area.  Apply ice and take over-the-counter and prescription medicines as told by your health care provider.  Rest and protect your back as told by your health care provider. Do not twist or bend at the waist. Get up to take short walks every 1-2 hours. This information is not intended to replace advice given to you by your health  care provider. Make sure you discuss any questions you have with your health care provider. Document Revised: 05/23/2019 Document Reviewed: 05/23/2019 Elsevier Patient Education  2021 [employer].

## 2020-05-06 NOTE — Telephone Encounter (Signed)
Pt aware refills were sent in at lunchtime

## 2020-05-06 NOTE — Progress Notes (Signed)
   Subjective:    Patient ID: Charles Pennington, male    DOB: 09-16-1965, 55 y.o.   MRN: [id number]  .No chief complaint on file.   HPI Pt presents to the office today for pre-op exam. He is scheduled to have L4-5 fusion. He reports he is having constant 9 out 10 pain. He is taking Norco without relief.  He has pain that radiated down his right leg that is numb and tingling.   He is a diabetic. States his glucose has been stable and running low 100's.   Review of Systems  Musculoskeletal: Positive for back pain.  All other systems reviewed and are negative.      Objective:   Physical Exam Vitals reviewed.  Constitutional:      General: He is not in acute distress.    Appearance: He is well-developed.  HENT:     Head: Normocephalic.     Right Ear: Tympanic membrane normal.     Left Ear: Tympanic membrane normal.  Eyes:     General:        Right eye: No discharge.        Left eye: No discharge.     Pupils: Pupils are equal, round, and reactive to light.  Neck:     Thyroid: No thyromegaly.  Cardiovascular:     Rate and Rhythm: Normal rate and regular rhythm.     Heart sounds: Normal heart sounds. No murmur heard.   Pulmonary:     Effort: Pulmonary effort is normal. No respiratory distress.     Breath sounds: Normal breath sounds. No wheezing.  Abdominal:     General: Bowel sounds are normal. There is no distension.     Palpations: Abdomen is soft.     Tenderness: There is no abdominal tenderness.  Musculoskeletal:        General: Tenderness present. Normal range of motion.     Cervical back: Normal range of motion and neck supple.     Comments: Pain in lower back with flexion and extension  Skin:    General: Skin is warm and dry.     Findings: No erythema or rash.  Neurological:     Mental Status: He is alert and oriented to person, place, and time.     Cranial Nerves: No cranial nerve deficit.     Deep Tendon Reflexes: Reflexes are normal and symmetric.   Psychiatric:        Behavior: Behavior normal.        Thought Content: Thought content normal.        Judgment: Judgment normal.     BP 114/84   Pulse (!) 102   Temp (!) 97.5 F (36.4 C) (Temporal)   Ht $R'5\' 7"'uV$  (1.702 m)   Wt 217 lb (98.4 kg)   BMI 33.99 kg/m        Assessment & Plan:  Charles Pennington comes in today with chief complaint of Pre-op Exam   Diagnosis and orders addressed:  1. Pre-op exam - CMP14+EGFR - CBC with Differential/Platelet - Bayer DCA Hb A1c Waived - TSH - EKG 12-Lead - DG Chest 2 View; Future   Labs pending Health Maintenance reviewed Diet and exercise encouraged  Follow up plan: Keep chronic follow up   Evelina Dun, FNP

## 2020-05-07 LAB — CBC WITH DIFFERENTIAL/PLATELET
Basophils Absolute: 0.1 10*3/uL (ref 0.0–0.2)
Basos: 1 %
EOS (ABSOLUTE): 0.3 10*3/uL (ref 0.0–0.4)
Eos: 3 %
Hematocrit: 54.2 % — ABNORMAL HIGH (ref 37.5–51.0)
Hemoglobin: 18.5 g/dL — ABNORMAL HIGH (ref 13.0–17.7)
Immature Grans (Abs): 0 10*3/uL (ref 0.0–0.1)
Immature Granulocytes: 0 %
Lymphocytes Absolute: 2.6 10*3/uL (ref 0.7–3.1)
Lymphs: 28 %
MCH: 32.5 pg (ref 26.6–33.0)
MCHC: 34.1 g/dL (ref 31.5–35.7)
MCV: 95 fL (ref 79–97)
Monocytes Absolute: 0.5 10*3/uL (ref 0.1–0.9)
Monocytes: 5 %
Neutrophils Absolute: 5.9 10*3/uL (ref 1.4–7.0)
Neutrophils: 63 %
Platelets: 207 10*3/uL (ref 150–450)
RBC: 5.7 x10E6/uL (ref 4.14–5.80)
RDW: 12.3 % (ref 11.6–15.4)
WBC: 9.3 10*3/uL (ref 3.4–10.8)

## 2020-05-07 LAB — CMP14+EGFR
ALT: 19 IU/L (ref 0–44)
AST: 17 IU/L (ref 0–40)
Albumin/Globulin Ratio: 1.9 (ref 1.2–2.2)
Albumin: 4.2 g/dL (ref 3.8–4.9)
Alkaline Phosphatase: 113 IU/L (ref 44–121)
BUN/Creatinine Ratio: 18 (ref 9–20)
BUN: 17 mg/dL (ref 6–24)
Bilirubin Total: 0.3 mg/dL (ref 0.0–1.2)
CO2: 25 mmol/L (ref 20–29)
Calcium: 9.9 mg/dL (ref 8.7–10.2)
Chloride: 95 mmol/L — ABNORMAL LOW (ref 96–106)
Creatinine, Ser: 0.95 mg/dL (ref 0.76–1.27)
Globulin, Total: 2.2 g/dL (ref 1.5–4.5)
Glucose: 328 mg/dL — ABNORMAL HIGH (ref 65–99)
Potassium: 4 mmol/L (ref 3.5–5.2)
Sodium: 135 mmol/L (ref 134–144)
Total Protein: 6.4 g/dL (ref 6.0–8.5)
eGFR: 96 mL/min/{1.73_m2} (ref >59)

## 2020-05-07 LAB — TSH: TSH: 0.672 u[IU]/mL (ref 0.450–4.500)

## 2020-05-08 NOTE — Telephone Encounter (Signed)
Patient aware and verbalized understanding. °

## 2020-05-08 NOTE — Telephone Encounter (Signed)
Pt does not need to see Ortho, already has neurosurgereon.

## 2020-05-09 ENCOUNTER — Ambulatory Visit: Payer: Medicare PPO | Admitting: Family

## 2020-05-12 ENCOUNTER — Other Ambulatory Visit: Payer: Self-pay | Admitting: Family

## 2020-05-14 ENCOUNTER — Telehealth: Payer: Self-pay

## 2020-05-14 NOTE — Telephone Encounter (Signed)
Pt has an upcoming back surgery. At the surgical clearance appt pts A1C was elevated. Christy wanted pt to recheck in one month to be cleared for surgery.  Pt questions whether it will take one month to see a better sugar level. States that he had three hershey kisses before his labs the day of his surgical clearance appt.  Informed pt that the A1C can be affected by the candy but it really looks at how he has been doing over the past few months. Instructed pt that he should give himself a good month and then re check A1C. Pt understood.

## 2020-05-20 ENCOUNTER — Telehealth: Payer: Self-pay

## 2020-05-20 NOTE — Telephone Encounter (Signed)
Patient aware no call was made

## 2020-05-20 NOTE — Telephone Encounter (Signed)
Do not see anything in the chart- did either of you call patient ?

## 2020-05-22 DIAGNOSIS — F17200 Nicotine dependence, unspecified, uncomplicated: Secondary | ICD-10-CM | POA: Diagnosis not present

## 2020-05-22 DIAGNOSIS — Z79899 Other long term (current) drug therapy: Secondary | ICD-10-CM | POA: Diagnosis not present

## 2020-05-22 DIAGNOSIS — F17210 Nicotine dependence, cigarettes, uncomplicated: Secondary | ICD-10-CM | POA: Diagnosis not present

## 2020-05-22 DIAGNOSIS — E119 Type 2 diabetes mellitus without complications: Secondary | ICD-10-CM | POA: Diagnosis not present

## 2020-05-22 DIAGNOSIS — Z6832 Body mass index (BMI) 32.0-32.9, adult: Secondary | ICD-10-CM | POA: Diagnosis not present

## 2020-05-22 DIAGNOSIS — G894 Chronic pain syndrome: Secondary | ICD-10-CM | POA: Diagnosis not present

## 2020-05-22 DIAGNOSIS — F1120 Opioid dependence, uncomplicated: Secondary | ICD-10-CM | POA: Diagnosis not present

## 2020-05-22 DIAGNOSIS — M5136 Other intervertebral disc degeneration, lumbar region: Secondary | ICD-10-CM | POA: Diagnosis not present

## 2020-05-24 ENCOUNTER — Other Ambulatory Visit: Payer: Self-pay | Admitting: Family

## 2020-06-03 ENCOUNTER — Other Ambulatory Visit: Payer: Self-pay | Admitting: *Deleted

## 2020-06-03 DIAGNOSIS — K219 Gastro-esophageal reflux disease without esophagitis: Secondary | ICD-10-CM

## 2020-06-03 MED ORDER — OMEPRAZOLE 20 MG PO CPDR: DELAYED_RELEASE_CAPSULE | ORAL | 0 refills | Status: DC

## 2020-06-09 ENCOUNTER — Encounter: Payer: Self-pay | Admitting: Family

## 2020-06-09 ENCOUNTER — Ambulatory Visit (INDEPENDENT_AMBULATORY_CARE_PROVIDER_SITE_OTHER): Payer: Medicare PPO | Admitting: Family

## 2020-06-09 ENCOUNTER — Other Ambulatory Visit: Payer: Self-pay

## 2020-06-09 DIAGNOSIS — Z9114 Patient's other noncompliance with medication regimen: Secondary | ICD-10-CM

## 2020-06-09 DIAGNOSIS — K219 Gastro-esophageal reflux disease without esophagitis: Secondary | ICD-10-CM | POA: Diagnosis not present

## 2020-06-09 DIAGNOSIS — G8929 Other chronic pain: Secondary | ICD-10-CM

## 2020-06-09 DIAGNOSIS — E118 Type 2 diabetes mellitus with unspecified complications: Secondary | ICD-10-CM

## 2020-06-09 DIAGNOSIS — M545 Low back pain, unspecified: Secondary | ICD-10-CM

## 2020-06-09 LAB — BAYER DCA HB A1C WAIVED: HB A1C (BAYER DCA - WAIVED): 8.3 % — ABNORMAL HIGH (ref <7.0)

## 2020-06-09 MED ORDER — TRESIBA FLEXTOUCH 200 UNIT/ML ~~LOC~~ SOPN: 40.0000 [IU] | PEN_INJECTOR | Freq: Every evening | SUBCUTANEOUS | 0 refills | Status: DC

## 2020-06-09 MED ORDER — DAPAGLIFLOZIN PROPANEDIOL 10 MG PO TABS
10.0000 mg | ORAL_TABLET | Freq: Every day | ORAL | 3 refills | Status: DC
Start: 2020-06-09 — End: 2021-07-23

## 2020-06-09 MED ORDER — METFORMIN HCL ER 750 MG PO TB24: ORAL_TABLET | ORAL | 2 refills | Status: DC

## 2020-06-09 MED ORDER — LISINOPRIL 20 MG PO TABS: 20.0000 mg | ORAL_TABLET | Freq: Every day | ORAL | 2 refills | Status: DC

## 2020-06-09 MED ORDER — OMEPRAZOLE 20 MG PO CPDR: DELAYED_RELEASE_CAPSULE | ORAL | 0 refills | Status: DC

## 2020-06-09 MED ORDER — LEVOTHYROXINE SODIUM 175 MCG PO TABS: ORAL_TABLET | ORAL | 3 refills | Status: DC

## 2020-06-09 MED ORDER — TRULICITY 3 MG/0.5ML ~~LOC~~ SOAJ: 1.0000 mg | SUBCUTANEOUS | Status: DC

## 2020-06-09 NOTE — Patient Instructions (Signed)
Diabetes Mellitus and Nutrition, Adult When you have diabetes, or diabetes mellitus, it is very important to have healthy eating habits because your blood sugar (glucose) levels are greatly affected by what you eat and drink. Eating healthy foods in the right amounts, at about the same times every day, can help you:  Control your blood glucose.  Lower your risk of heart disease.  Improve your blood pressure.  Reach or maintain a healthy weight. What can affect my meal plan? Every person with diabetes is different, and each person has different needs for a meal plan. Your health care provider may recommend that you work with a dietitian to make a meal plan that is best for you. Your meal plan may vary depending on factors such as:  The calories you need.  The medicines you take.  Your weight.  Your blood glucose, blood pressure, and cholesterol levels.  Your activity level.  Other health conditions you have, such as heart or kidney disease. How do carbohydrates affect me? Carbohydrates, also called carbs, affect your blood glucose level more than any other type of food. Eating carbs naturally raises the amount of glucose in your blood. Carb counting is a method for keeping track of how many carbs you eat. Counting carbs is important to keep your blood glucose at a healthy level, especially if you use insulin or take certain oral diabetes medicines. It is important to know how many carbs you can safely have in each meal. This is different for every person. Your dietitian can help you calculate how many carbs you should have at each meal and for each snack. How does alcohol affect me? Alcohol can cause a sudden decrease in blood glucose (hypoglycemia), especially if you use insulin or take certain oral diabetes medicines. Hypoglycemia can be a life-threatening condition. Symptoms of hypoglycemia, such as sleepiness, dizziness, and confusion, are similar to symptoms of having too much  alcohol.  Do not drink alcohol if: ? Your health care provider tells you not to drink. ? You are pregnant, may be pregnant, or are planning to become pregnant.  If you drink alcohol: ? Do not drink on an empty stomach. ? Limit how much you use to:  0-1 drink a day for women.  0-2 drinks a day for men. ? Be aware of how much alcohol is in your drink. In the [place] one drink equals one 12 oz bottle of beer (355 mL), one 5 oz glass of wine (148 mL), or one 1 oz glass of hard liquor (44 mL). ? Keep yourself hydrated with water, diet soda, or unsweetened iced tea.  Keep in mind that regular soda, juice, and other mixers may contain a lot of sugar and must be counted as carbs. What are tips for following this plan? Reading food labels  Start by checking the serving size on the "Nutrition Facts" label of packaged foods and drinks. The amount of calories, carbs, fats, and other nutrients listed on the label is based on one serving of the item. Many items contain more than one serving per package.  Check the total grams (g) of carbs in one serving. You can calculate the number of servings of carbs in one serving by dividing the total carbs by 15. For example, if a food has 30 g of total carbs per serving, it would be equal to 2 servings of carbs.  Check the number of grams (g) of saturated fats and trans fats in one serving. Choose foods that have   a low amount or none of these fats.  Check the number of milligrams (mg) of salt (sodium) in one serving. Most people should limit total sodium intake to less than 2,300 mg per day.  Always check the nutrition information of foods labeled as "low-fat" or "nonfat." These foods may be higher in added sugar or refined carbs and should be avoided.  Talk to your dietitian to identify your daily goals for nutrients listed on the label. Shopping  Avoid buying canned, pre-made, or processed foods. These foods tend to be high in fat, sodium, and added  sugar.  Shop around the outside edge of the grocery store. This is where you will most often find fresh fruits and vegetables, bulk grains, fresh meats, and fresh dairy. Cooking  Use low-heat cooking methods, such as baking, instead of high-heat cooking methods like deep frying.  Cook using healthy oils, such as olive, canola, or sunflower oil.  Avoid cooking with butter, cream, or high-fat meats. Meal planning  Eat meals and snacks regularly, preferably at the same times every day. Avoid going long periods of time without eating.  Eat foods that are high in fiber, such as fresh fruits, vegetables, beans, and whole grains. Talk with your dietitian about how many servings of carbs you can eat at each meal.  Eat 4-6 oz (112-168 g) of lean protein each day, such as lean meat, chicken, fish, eggs, or tofu. One ounce (oz) of lean protein is equal to: ? 1 oz (28 g) of meat, chicken, or fish. ? 1 egg. ?  cup (62 g) of tofu.  Eat some foods each day that contain healthy fats, such as avocado, nuts, seeds, and fish.   What foods should I eat? Fruits Berries. Apples. Oranges. Peaches. Apricots. Plums. Grapes. Mango. Papaya. Pomegranate. Kiwi. Cherries. Vegetables Lettuce. Spinach. Leafy greens, including kale, chard, collard greens, and mustard greens. Beets. Cauliflower. Cabbage. Broccoli. Carrots. Green beans. Tomatoes. Peppers. Onions. Cucumbers. Brussels sprouts. Grains Whole grains, such as whole-wheat or whole-grain bread, crackers, tortillas, cereal, and pasta. Unsweetened oatmeal. Quinoa. Brown or wild rice. Meats and other proteins Seafood. Poultry without skin. Lean cuts of poultry and beef. Tofu. Nuts. Seeds. Dairy Low-fat or fat-free dairy products such as milk, yogurt, and cheese. The items listed above may not be a complete list of foods and beverages you can eat. Contact a dietitian for more information. What foods should I avoid? Fruits Fruits canned with  syrup. Vegetables Canned vegetables. Frozen vegetables with butter or cream sauce. Grains Refined white flour and flour products such as bread, pasta, snack foods, and cereals. Avoid all processed foods. Meats and other proteins Fatty cuts of meat. Poultry with skin. Breaded or fried meats. Processed meat. Avoid saturated fats. Dairy Full-fat yogurt, cheese, or milk. Beverages Sweetened drinks, such as soda or iced tea. The items listed above may not be a complete list of foods and beverages you should avoid. Contact a dietitian for more information. Questions to ask a health care provider  Do I need to meet with a diabetes educator?  Do I need to meet with a dietitian?  What number can I call if I have questions?  When are the best times to check my blood glucose? Where to find more information:  American Diabetes Association: diabetes.org  Academy of Nutrition and Dietetics: www.eatright.org  National Institute of Diabetes and Digestive and Kidney Diseases: www.niddk.nih.gov  Association of Diabetes Care and Education Specialists: www.diabeteseducator.org Summary  It is important to have healthy eating   habits because your blood sugar (glucose) levels are greatly affected by what you eat and drink.  A healthy meal plan will help you control your blood glucose and maintain a healthy lifestyle.  Your health care provider may recommend that you work with a dietitian to make a meal plan that is best for you.  Keep in mind that carbohydrates (carbs) and alcohol have immediate effects on your blood glucose levels. It is important to count carbs and to use alcohol carefully. This information is not intended to replace advice given to you by your health care provider. Make sure you discuss any questions you have with your health care provider. Document Revised: 01/09/2019 Document Reviewed: 01/09/2019 Elsevier Patient Education  2021 Elsevier Inc.  

## 2020-06-09 NOTE — Progress Notes (Signed)
Subjective:    Patient ID: Charles Pennington, male    DOB: 21-Sep-1965, 55 y.o.   MRN: [id number]  Chief Complaint  Patient presents with  . Diabetes   Pt presents to the office today to recheck A1C so he can be cleared for back surgery. His last A1C was 8.5. He reports he has not taken his [place] in over a month. He is unsure if he is taking Farxiga 10 mg. He is taking part of his mother's metformin, but only 1000 mg BID.  Diabetes He presents for his follow-up diabetic visit. He has type 2 diabetes mellitus. His disease course has been stable. There are no hypoglycemic associated symptoms. Associated symptoms include foot paresthesias. Pertinent negatives for diabetes include no blurred vision. Symptoms are stable. Risk factors for coronary artery disease include dyslipidemia, diabetes mellitus, male sex, hypertension and sedentary lifestyle. He is following a generally unhealthy diet. His overall blood glucose range is 180-200 mg/dl. An ACE inhibitor/angiotensin II receptor blocker is being taken.      Review of Systems  Eyes: Negative for blurred vision.  All other systems reviewed and are negative.      Objective:   Physical Exam Vitals reviewed.  Constitutional:      General: He is not in acute distress.    Appearance: He is well-developed.  HENT:     Head: Normocephalic.  Eyes:     General:        Right eye: No discharge.        Left eye: No discharge.     Pupils: Pupils are equal, round, and reactive to light.  Neck:     Thyroid: No thyromegaly.  Cardiovascular:     Rate and Rhythm: Normal rate and regular rhythm.     Heart sounds: Normal heart sounds. No murmur heard.   Pulmonary:     Effort: Pulmonary effort is normal. No respiratory distress.     Breath sounds: Normal breath sounds. No wheezing.  Abdominal:     General: Bowel sounds are normal. There is no distension.     Palpations: Abdomen is soft.     Tenderness: There is no abdominal tenderness.   Musculoskeletal:        General: No tenderness.     Cervical back: Normal range of motion and neck supple.     Comments: Pain in lumbar with flexion and extension  Skin:    General: Skin is warm and dry.     Findings: Erythema (rash on face) present. No rash.  Neurological:     Mental Status: He is alert and oriented to person, place, and time.     Cranial Nerves: No cranial nerve deficit.     Deep Tendon Reflexes: Reflexes are normal and symmetric.  Psychiatric:        Behavior: Behavior normal.        Thought Content: Thought content normal.        Judgment: Judgment normal.      There were no vitals taken for this visit.      Assessment & Plan:  Charles Pennington comes in today with chief complaint of Diabetes   Diagnosis and orders addressed:  1. Diabetes mellitus with complication (HCC) Pt has not taken [place] in over a month. Will decrease to 40 units daily.  Strict low carb diet RTO in 2 weeks to recheck  - Bayer DCA Hb A1c Waived - metFORMIN (GLUCOPHAGE-XR) 750 MG 24 hr tablet; TAKE 2 TABLET WITH MORNING  MEAL  Dispense: 180 tablet; Refill: 2 - Dulaglutide (TRULICITY) 3 MG/0.5ML SOPN; Inject 1 mg as directed once a week. Discontinue 1.5mg  sq week dose - dapagliflozin propanediol (FARXIGA) 10 MG TABS tablet; Take 1 tablet (10 mg total) by mouth daily.  Dispense: 90 tablet; Refill: 3 - insulin degludec (TRESIBA FLEXTOUCH) 200 UNIT/ML FlexTouch Pen; Inject 40 Units into the skin at bedtime.  Dispense: 18 mL; Refill: 0  2. Gastroesophageal reflux disease - omeprazole (PRILOSEC) 20 MG capsule; TAKE (1) CAPSULE DAILY  Dispense: 90 capsule; Refill: 0  3. Noncompliance with medications    4. Chronic low back pain, unspecified back pain laterality, unspecified whether sciatica present   Can not sign surgical clearance until A1C is improved Discussed in length and went over each medication. He will restart all of his diabetic medications today and follow up in 2  weeks.    [name], FNP

## 2020-06-14 ENCOUNTER — Other Ambulatory Visit: Payer: Self-pay | Admitting: Family

## 2020-06-17 ENCOUNTER — Other Ambulatory Visit: Payer: Self-pay | Admitting: Family

## 2020-06-17 DIAGNOSIS — K219 Gastro-esophageal reflux disease without esophagitis: Secondary | ICD-10-CM

## 2020-06-17 DIAGNOSIS — E118 Type 2 diabetes mellitus with unspecified complications: Secondary | ICD-10-CM

## 2020-06-20 ENCOUNTER — Encounter: Payer: Self-pay | Admitting: Family Medicine

## 2020-06-20 DIAGNOSIS — R030 Elevated blood-pressure reading, without diagnosis of hypertension: Secondary | ICD-10-CM | POA: Diagnosis not present

## 2020-06-20 DIAGNOSIS — F1120 Opioid dependence, uncomplicated: Secondary | ICD-10-CM | POA: Diagnosis not present

## 2020-06-20 DIAGNOSIS — E119 Type 2 diabetes mellitus without complications: Secondary | ICD-10-CM | POA: Diagnosis not present

## 2020-06-20 DIAGNOSIS — G894 Chronic pain syndrome: Secondary | ICD-10-CM | POA: Diagnosis not present

## 2020-06-20 DIAGNOSIS — Z79899 Other long term (current) drug therapy: Secondary | ICD-10-CM | POA: Diagnosis not present

## 2020-06-20 DIAGNOSIS — F17200 Nicotine dependence, unspecified, uncomplicated: Secondary | ICD-10-CM | POA: Diagnosis not present

## 2020-06-20 DIAGNOSIS — M5136 Other intervertebral disc degeneration, lumbar region: Secondary | ICD-10-CM | POA: Diagnosis not present

## 2020-06-20 DIAGNOSIS — Z6832 Body mass index (BMI) 32.0-32.9, adult: Secondary | ICD-10-CM | POA: Diagnosis not present

## 2020-06-20 DIAGNOSIS — F17210 Nicotine dependence, cigarettes, uncomplicated: Secondary | ICD-10-CM | POA: Diagnosis not present

## 2020-06-23 ENCOUNTER — Ambulatory Visit: Payer: Medicare PPO | Admitting: Family

## 2020-06-23 ENCOUNTER — Telehealth: Payer: Self-pay

## 2020-06-23 NOTE — Telephone Encounter (Signed)
Pt called to reschedule his appt that he missed this morning. Said he needs to see Neysa Bonito before he can have back surgery. Explained to pt that Christys first available right now is on 07/11/20. Pt requested to speak with the nurse. I explained to pt that nurse and Neysa Bonito were seeing pts right now but that I could put in a telephone message and a nurse would call him back. Pt got very rude with me and said that he would just call back and speak to someone else who would transfer him to the nurse.

## 2020-06-25 ENCOUNTER — Encounter: Payer: Self-pay | Admitting: Family Medicine

## 2020-06-25 ENCOUNTER — Telehealth: Payer: Self-pay | Admitting: Family Medicine

## 2020-06-25 NOTE — Telephone Encounter (Signed)
Pt wants to speak with [facility] nurse about his back surgery.

## 2020-06-27 ENCOUNTER — Encounter: Payer: Self-pay | Admitting: Family

## 2020-06-30 ENCOUNTER — Other Ambulatory Visit: Payer: Self-pay

## 2020-06-30 ENCOUNTER — Ambulatory Visit (INDEPENDENT_AMBULATORY_CARE_PROVIDER_SITE_OTHER): Payer: Medicare PPO | Admitting: Family

## 2020-06-30 ENCOUNTER — Encounter: Payer: Self-pay | Admitting: Family

## 2020-06-30 VITALS — BP 112/80 | HR 95 | Temp 97.0°F | Ht 67.0 in | Wt 211.4 lb

## 2020-06-30 DIAGNOSIS — E039 Hypothyroidism, unspecified: Secondary | ICD-10-CM | POA: Diagnosis not present

## 2020-06-30 DIAGNOSIS — E1169 Type 2 diabetes mellitus with other specified complication: Secondary | ICD-10-CM

## 2020-06-30 DIAGNOSIS — E118 Type 2 diabetes mellitus with unspecified complications: Secondary | ICD-10-CM | POA: Diagnosis not present

## 2020-06-30 DIAGNOSIS — M5440 Lumbago with sciatica, unspecified side: Secondary | ICD-10-CM

## 2020-06-30 DIAGNOSIS — E1159 Type 2 diabetes mellitus with other circulatory complications: Secondary | ICD-10-CM

## 2020-06-30 DIAGNOSIS — E785 Hyperlipidemia, unspecified: Secondary | ICD-10-CM

## 2020-06-30 DIAGNOSIS — F17200 Nicotine dependence, unspecified, uncomplicated: Secondary | ICD-10-CM

## 2020-06-30 DIAGNOSIS — K219 Gastro-esophageal reflux disease without esophagitis: Secondary | ICD-10-CM

## 2020-06-30 DIAGNOSIS — M961 Postlaminectomy syndrome, not elsewhere classified: Secondary | ICD-10-CM | POA: Diagnosis not present

## 2020-06-30 DIAGNOSIS — Z794 Long term (current) use of insulin: Secondary | ICD-10-CM

## 2020-06-30 DIAGNOSIS — E119 Type 2 diabetes mellitus without complications: Secondary | ICD-10-CM | POA: Diagnosis not present

## 2020-06-30 DIAGNOSIS — I152 Hypertension secondary to endocrine disorders: Secondary | ICD-10-CM

## 2020-06-30 DIAGNOSIS — G8929 Other chronic pain: Secondary | ICD-10-CM

## 2020-06-30 DIAGNOSIS — J441 Chronic obstructive pulmonary disease with (acute) exacerbation: Secondary | ICD-10-CM | POA: Diagnosis not present

## 2020-06-30 DIAGNOSIS — F411 Generalized anxiety disorder: Secondary | ICD-10-CM

## 2020-06-30 DIAGNOSIS — Z9114 Patient's other noncompliance with medication regimen: Secondary | ICD-10-CM

## 2020-06-30 DIAGNOSIS — Z8673 Personal history of transient ischemic attack (TIA), and cerebral infarction without residual deficits: Secondary | ICD-10-CM

## 2020-06-30 DIAGNOSIS — F331 Major depressive disorder, recurrent, moderate: Secondary | ICD-10-CM

## 2020-06-30 LAB — BAYER DCA HB A1C WAIVED: HB A1C (BAYER DCA - WAIVED): 8.7 % — ABNORMAL HIGH (ref <7.0)

## 2020-06-30 MED ORDER — BLOOD GLUCOSE METER KIT: PACK | 0 refills | Status: DC

## 2020-06-30 NOTE — Progress Notes (Signed)
Subjective:    Patient ID: Charles Pennington, male    DOB: [birth year], 55 y.o.   MRN: [id number]  Chief Complaint  Patient presents with  . Diabetes    For surgery    Pt presents to the office today for chronic follow up.He is a poor historian. He is suppose to be scheduled for lumbar surgery, but needs his A1C below 8.   Pt had a CVA when he turned 31 years. PT states he continue to have "balance issues related to the CVA". PT states this is stable.   Pt is noncompliant with his medicationsand takes "most of his pills most days".   [facility] month for chronic back pain and diabetic neuropathy. Diabetes He presents for his follow-up diabetic visit. He has type 2 diabetes mellitus. Hypoglycemia symptoms include nervousness/anxiousness. Associated symptoms include blurred vision and foot paresthesias. Pertinent negatives for diabetes include no fatigue. Symptoms are stable. Diabetic complications include heart disease, nephropathy and peripheral neuropathy. Risk factors for coronary artery disease include dyslipidemia, diabetes mellitus, male sex, hypertension and sedentary lifestyle. He is following a generally unhealthy diet. (Does not check BS at home) Eye exam is not current.  Hypertension This is a chronic problem. The current episode started more than 1 year ago. The problem has been resolved since onset. The problem is controlled. Associated symptoms include blurred vision. Pertinent negatives include no malaise/fatigue, peripheral edema or shortness of breath. Risk factors for coronary artery disease include dyslipidemia, diabetes mellitus, obesity and male gender. The current treatment provides moderate improvement. Identifiable causes of hypertension include a thyroid problem.  Gastroesophageal Reflux He complains of belching and heartburn. This is a chronic problem. The current episode started more than 1 year ago. The problem occurs occasionally. The  symptoms are aggravated by certain foods. Pertinent negatives include no fatigue. Risk factors include obesity. He has tried a PPI for the symptoms. The treatment provided moderate relief.  Hyperlipidemia This is a chronic problem. The current episode started more than 1 year ago. The problem is uncontrolled. Exacerbating diseases include obesity. Pertinent negatives include no shortness of breath. Current antihyperlipidemic treatment includes statins. The current treatment provides moderate improvement of lipids. Risk factors for coronary artery disease include dyslipidemia, diabetes mellitus, male sex and a sedentary lifestyle.  Thyroid Problem Presents for follow-up visit. Symptoms include anxiety. Patient reports no depressed mood, diaphoresis, diarrhea or fatigue. The symptoms have been stable. His past medical history is significant for hyperlipidemia.  Back Pain This is a chronic problem. The current episode started more than 1 year ago. The problem occurs intermittently. The problem has been waxing and waning since onset. The pain is present in the lumbar spine. The pain is at a severity of 8/10. The pain is moderate.  Nicotine Dependence Presents for follow-up visit. Symptoms are negative for fatigue. His urge triggers include company of smokers. The symptoms have been stable. He smokes < 1/2 a pack of cigarettes per day.   COPD  Continues to smoke 1/2 pack a day. Using Trelegy daily.    Review of Systems  Constitutional: Negative for diaphoresis, fatigue and malaise/fatigue.  Eyes: Positive for blurred vision.  Respiratory: Negative for shortness of breath.   Gastrointestinal: Positive for heartburn. Negative for diarrhea.  Musculoskeletal: Positive for back pain.  Psychiatric/Behavioral: The patient is nervous/anxious.   All other systems reviewed and are negative.      Objective:   Physical Exam Vitals reviewed.  Constitutional:  General: He is not in acute distress.     Appearance: He is well-developed.  HENT:     Head: Normocephalic.     Right Ear: Tympanic membrane normal.     Left Ear: Tympanic membrane normal.  Eyes:     General:        Right eye: No discharge.        Left eye: No discharge.     Pupils: Pupils are equal, round, and reactive to light.  Neck:     Thyroid: No thyromegaly.  Cardiovascular:     Rate and Rhythm: Normal rate and regular rhythm.     Heart sounds: Normal heart sounds. No murmur heard.   Pulmonary:     Effort: Pulmonary effort is normal. No respiratory distress.     Breath sounds: Normal breath sounds. No wheezing.  Abdominal:     General: Bowel sounds are normal. There is no distension.     Palpations: Abdomen is soft.     Tenderness: There is no abdominal tenderness.  Musculoskeletal:        General: Tenderness present.     Cervical back: Normal range of motion and neck supple.     Comments: Pain in lumbar in flexion and extension  Skin:    General: Skin is warm and dry.     Findings: No erythema or rash.  Neurological:     Mental Status: He is alert and oriented to person, place, and time.     Cranial Nerves: No cranial nerve deficit.     Deep Tendon Reflexes: Reflexes are normal and symmetric.  Psychiatric:        Behavior: Behavior normal.        Thought Content: Thought content normal.        Judgment: Judgment normal.       BP 112/80   Pulse 95   Temp (!) 97 F (36.1 C) (Temporal)   Ht 5\' 7"  (1.702 m)   Wt 211 lb 6.4 oz (95.9 kg)   BMI 33.11 kg/m      Assessment & Plan:  Charles Pennington comes in today with chief complaint of Diabetes (For surgery )   Diagnosis and orders addressed:  1. Diabetes mellitus with complication (HCC) - Bayer DCA Hb A1c Waived  2. Hypertension associated with diabetes (HCC)  3. COPD with acute exacerbation (HCC)  4. Gastroesophageal reflux disease, unspecified whether esophagitis present  5. Type 2 diabetes mellitus treated with insulin (HCC)  6.  Hypothyroidism, unspecified type  7. Hyperlipidemia associated with type 2 diabetes mellitus (HCC)  8. SMOKER  9. Postlaminectomy syndrome, lumbar region  10. Noncompliance with medications  11. Morbid obesity (HCC)  12. Hyperlipidemia, unspecified hyperlipidemia type  13. History of CVA (cerebrovascular accident)  14. GAD (generalized anxiety disorder)  15. Moderate episode of recurrent major depressive disorder (HCC)  16. Chronic low back pain with sciatica, sciatica laterality unspecified, unspecified back pain laterality   Labs pending Health Maintenance reviewed Diet and exercise encouraged  Follow up plan: 1 month   Elbert Ewings, FNP

## 2020-06-30 NOTE — Patient Instructions (Signed)
Diabetes Mellitus and Nutrition, Adult When you have diabetes, or diabetes mellitus, it is very important to have healthy eating habits because your blood sugar (glucose) levels are greatly affected by what you eat and drink. Eating healthy foods in the right amounts, at about the same times every day, can help you:  Control your blood glucose.  Lower your risk of heart disease.  Improve your blood pressure.  Reach or maintain a healthy weight. What can affect my meal plan? Every person with diabetes is different, and each person has different needs for a meal plan. Your health care provider may recommend that you work with a dietitian to make a meal plan that is best for you. Your meal plan may vary depending on factors such as:  The calories you need.  The medicines you take.  Your weight.  Your blood glucose, blood pressure, and cholesterol levels.  Your activity level.  Other health conditions you have, such as heart or kidney disease. How do carbohydrates affect me? Carbohydrates, also called carbs, affect your blood glucose level more than any other type of food. Eating carbs naturally raises the amount of glucose in your blood. Carb counting is a method for keeping track of how many carbs you eat. Counting carbs is important to keep your blood glucose at a healthy level, especially if you use insulin or take certain oral diabetes medicines. It is important to know how many carbs you can safely have in each meal. This is different for every person. Your dietitian can help you calculate how many carbs you should have at each meal and for each snack. How does alcohol affect me? Alcohol can cause a sudden decrease in blood glucose (hypoglycemia), especially if you use insulin or take certain oral diabetes medicines. Hypoglycemia can be a life-threatening condition. Symptoms of hypoglycemia, such as sleepiness, dizziness, and confusion, are similar to symptoms of having too much  alcohol.  Do not drink alcohol if: ? Your health care provider tells you not to drink. ? You are pregnant, may be pregnant, or are planning to become pregnant.  If you drink alcohol: ? Do not drink on an empty stomach. ? Limit how much you use to:  0-1 drink a day for women.  0-2 drinks a day for men. ? Be aware of how much alcohol is in your drink. In the [place] one drink equals one 12 oz bottle of beer (355 mL), one 5 oz glass of wine (148 mL), or one 1 oz glass of hard liquor (44 mL). ? Keep yourself hydrated with water, diet soda, or unsweetened iced tea.  Keep in mind that regular soda, juice, and other mixers may contain a lot of sugar and must be counted as carbs. What are tips for following this plan? Reading food labels  Start by checking the serving size on the "Nutrition Facts" label of packaged foods and drinks. The amount of calories, carbs, fats, and other nutrients listed on the label is based on one serving of the item. Many items contain more than one serving per package.  Check the total grams (g) of carbs in one serving. You can calculate the number of servings of carbs in one serving by dividing the total carbs by 15. For example, if a food has 30 g of total carbs per serving, it would be equal to 2 servings of carbs.  Check the number of grams (g) of saturated fats and trans fats in one serving. Choose foods that have   a low amount or none of these fats.  Check the number of milligrams (mg) of salt (sodium) in one serving. Most people should limit total sodium intake to less than 2,300 mg per day.  Always check the nutrition information of foods labeled as "low-fat" or "nonfat." These foods may be higher in added sugar or refined carbs and should be avoided.  Talk to your dietitian to identify your daily goals for nutrients listed on the label. Shopping  Avoid buying canned, pre-made, or processed foods. These foods tend to be high in fat, sodium, and added  sugar.  Shop around the outside edge of the grocery store. This is where you will most often find fresh fruits and vegetables, bulk grains, fresh meats, and fresh dairy. Cooking  Use low-heat cooking methods, such as baking, instead of high-heat cooking methods like deep frying.  Cook using healthy oils, such as olive, canola, or sunflower oil.  Avoid cooking with butter, cream, or high-fat meats. Meal planning  Eat meals and snacks regularly, preferably at the same times every day. Avoid going long periods of time without eating.  Eat foods that are high in fiber, such as fresh fruits, vegetables, beans, and whole grains. Talk with your dietitian about how many servings of carbs you can eat at each meal.  Eat 4-6 oz (112-168 g) of lean protein each day, such as lean meat, chicken, fish, eggs, or tofu. One ounce (oz) of lean protein is equal to: ? 1 oz (28 g) of meat, chicken, or fish. ? 1 egg. ?  cup (62 g) of tofu.  Eat some foods each day that contain healthy fats, such as avocado, nuts, seeds, and fish.   What foods should I eat? Fruits Berries. Apples. Oranges. Peaches. Apricots. Plums. Grapes. Mango. Papaya. Pomegranate. Kiwi. Cherries. Vegetables Lettuce. Spinach. Leafy greens, including kale, chard, collard greens, and mustard greens. Beets. Cauliflower. Cabbage. Broccoli. Carrots. Green beans. Tomatoes. Peppers. Onions. Cucumbers. Brussels sprouts. Grains Whole grains, such as whole-wheat or whole-grain bread, crackers, tortillas, cereal, and pasta. Unsweetened oatmeal. Quinoa. Brown or wild rice. Meats and other proteins Seafood. Poultry without skin. Lean cuts of poultry and beef. Tofu. Nuts. Seeds. Dairy Low-fat or fat-free dairy products such as milk, yogurt, and cheese. The items listed above may not be a complete list of foods and beverages you can eat. Contact a dietitian for more information. What foods should I avoid? Fruits Fruits canned with  syrup. Vegetables Canned vegetables. Frozen vegetables with butter or cream sauce. Grains Refined white flour and flour products such as bread, pasta, snack foods, and cereals. Avoid all processed foods. Meats and other proteins Fatty cuts of meat. Poultry with skin. Breaded or fried meats. Processed meat. Avoid saturated fats. Dairy Full-fat yogurt, cheese, or milk. Beverages Sweetened drinks, such as soda or iced tea. The items listed above may not be a complete list of foods and beverages you should avoid. Contact a dietitian for more information. Questions to ask a health care provider  Do I need to meet with a diabetes educator?  Do I need to meet with a dietitian?  What number can I call if I have questions?  When are the best times to check my blood glucose? Where to find more information:  American Diabetes Association: diabetes.org  Academy of Nutrition and Dietetics: www.eatright.org  National Institute of Diabetes and Digestive and Kidney Diseases: www.niddk.nih.gov  Association of Diabetes Care and Education Specialists: www.diabeteseducator.org Summary  It is important to have healthy eating   habits because your blood sugar (glucose) levels are greatly affected by what you eat and drink.  A healthy meal plan will help you control your blood glucose and maintain a healthy lifestyle.  Your health care provider may recommend that you work with a dietitian to make a meal plan that is best for you.  Keep in mind that carbohydrates (carbs) and alcohol have immediate effects on your blood glucose levels. It is important to count carbs and to use alcohol carefully. This information is not intended to replace advice given to you by your health care provider. Make sure you discuss any questions you have with your health care provider. Document Revised: 01/09/2019 Document Reviewed: 01/09/2019 Elsevier Patient Education  2021 Elsevier Inc.  

## 2020-07-01 ENCOUNTER — Other Ambulatory Visit: Payer: Self-pay | Admitting: Family

## 2020-07-01 DIAGNOSIS — J209 Acute bronchitis, unspecified: Secondary | ICD-10-CM

## 2020-07-01 DIAGNOSIS — R062 Wheezing: Secondary | ICD-10-CM

## 2020-07-11 ENCOUNTER — Ambulatory Visit: Payer: Medicare PPO | Admitting: Pharmacist

## 2020-07-15 ENCOUNTER — Telehealth: Payer: Self-pay | Admitting: Family

## 2020-07-15 DIAGNOSIS — F411 Generalized anxiety disorder: Secondary | ICD-10-CM

## 2020-07-15 DIAGNOSIS — F331 Major depressive disorder, recurrent, moderate: Secondary | ICD-10-CM

## 2020-07-15 MED ORDER — PAROXETINE HCL 40 MG PO TABS: 40.0000 mg | ORAL_TABLET | Freq: Every day | ORAL | 0 refills | Status: DC

## 2020-07-15 NOTE — Telephone Encounter (Signed)
  Prescription Request  07/15/2020  What is the name of the medication or equipment? paxil  Have you contacted your pharmacy to request a refill? (if applicable) yes  Which pharmacy would you like this sent to? [facility]   Patient notified that their request is being sent to the clinical staff for review and that they should receive a response within 2 business days.

## 2020-07-15 NOTE — Telephone Encounter (Signed)
Patient aware rx sent to pharmacy.  

## 2020-07-17 ENCOUNTER — Encounter: Payer: Self-pay | Admitting: *Deleted

## 2020-07-19 ENCOUNTER — Other Ambulatory Visit: Payer: Self-pay | Admitting: Family

## 2020-07-21 DIAGNOSIS — F17200 Nicotine dependence, unspecified, uncomplicated: Secondary | ICD-10-CM | POA: Diagnosis not present

## 2020-07-21 DIAGNOSIS — Z79899 Other long term (current) drug therapy: Secondary | ICD-10-CM | POA: Diagnosis not present

## 2020-07-21 DIAGNOSIS — M5136 Other intervertebral disc degeneration, lumbar region: Secondary | ICD-10-CM | POA: Diagnosis not present

## 2020-07-21 DIAGNOSIS — G894 Chronic pain syndrome: Secondary | ICD-10-CM | POA: Diagnosis not present

## 2020-07-21 DIAGNOSIS — E119 Type 2 diabetes mellitus without complications: Secondary | ICD-10-CM | POA: Diagnosis not present

## 2020-07-21 DIAGNOSIS — F1120 Opioid dependence, uncomplicated: Secondary | ICD-10-CM | POA: Diagnosis not present

## 2020-07-21 DIAGNOSIS — F17210 Nicotine dependence, cigarettes, uncomplicated: Secondary | ICD-10-CM | POA: Diagnosis not present

## 2020-07-21 DIAGNOSIS — R030 Elevated blood-pressure reading, without diagnosis of hypertension: Secondary | ICD-10-CM | POA: Diagnosis not present

## 2020-07-24 ENCOUNTER — Telehealth: Payer: Self-pay | Admitting: Family

## 2020-07-24 DIAGNOSIS — Z794 Long term (current) use of insulin: Secondary | ICD-10-CM

## 2020-07-24 DIAGNOSIS — I152 Hypertension secondary to endocrine disorders: Secondary | ICD-10-CM

## 2020-07-24 NOTE — Telephone Encounter (Signed)
Labs ordered.

## 2020-07-24 NOTE — Telephone Encounter (Signed)
Attempted to contact patient - NVM 

## 2020-07-28 ENCOUNTER — Other Ambulatory Visit: Payer: Self-pay | Admitting: Family

## 2020-07-28 DIAGNOSIS — F331 Major depressive disorder, recurrent, moderate: Secondary | ICD-10-CM

## 2020-07-28 DIAGNOSIS — E118 Type 2 diabetes mellitus with unspecified complications: Secondary | ICD-10-CM

## 2020-07-28 DIAGNOSIS — F411 Generalized anxiety disorder: Secondary | ICD-10-CM

## 2020-07-29 DIAGNOSIS — Z79899 Other long term (current) drug therapy: Secondary | ICD-10-CM | POA: Diagnosis not present

## 2020-07-30 ENCOUNTER — Ambulatory Visit (INDEPENDENT_AMBULATORY_CARE_PROVIDER_SITE_OTHER): Payer: Medicare PPO | Admitting: Nurse Practitioner

## 2020-07-30 ENCOUNTER — Encounter: Payer: Self-pay | Admitting: Nurse Practitioner

## 2020-07-30 ENCOUNTER — Other Ambulatory Visit: Payer: Self-pay

## 2020-07-30 VITALS — BP 98/73 | HR 101 | Temp 97.5°F | Ht 67.0 in | Wt 201.8 lb

## 2020-07-30 DIAGNOSIS — L0291 Cutaneous abscess, unspecified: Secondary | ICD-10-CM | POA: Insufficient documentation

## 2020-07-30 MED ORDER — CEPHALEXIN 500 MG PO CAPS: 500.0000 mg | ORAL_CAPSULE | Freq: Two times a day (BID) | ORAL | 0 refills | Status: DC

## 2020-07-30 NOTE — Progress Notes (Signed)
Acute Office Visit  Subjective:    Patient ID: Charles Pennington, male    DOB: 10/08/1965, 55 y.o.   MRN: [id number]  Chief Complaint  Patient presents with   Insect Bite    On back already taken off. Pulled off last weeks     HPI Patient is 55 year old with two open abscess on right upper back from a tick bite. Symptoms present for over 7 days.  Patient reports friend found ticks on his back removed with alcohol swab to eat.  Symptoms of redness, swelling and pruritus are worse.  No fever, or flulike.  Past Medical History:  Diagnosis Date   Anxiety    Asthma    Diabetes mellitus    Diabetes mellitus without complication ([place])    Hypercholesteremia    Hypertension    Hypothyroidism    Low back pain    Obesity    SOB (shortness of breath)    Stroke [facility]) age 72   Stroke [facility])    Vertigo     Past Surgical History:  Procedure Laterality Date   BACK SURGERY  2013   COLONOSCOPY  2009   Inflammatory changes of the cecum and ascending colon most consistent with infectious etiology, NSAID, ischemia. Suspected resolving infection based on symptomatology.   FOOT SURGERY     Dr Irving Shows   SHOULDER ARTHROSCOPY Right    SPINE SURGERY     WRIST SURGERY      Family History  Problem Relation Age of Onset   Hypertension Father    Suicidality Father    Diabetes Father    Diabetes Mother    Colon cancer Neg Hx    Inflammatory bowel disease Neg Hx     Social History   Socioeconomic History   Marital status: Single    Spouse name: Not on file   Number of children: 2   Years of education: Not on file   Highest education level: 8th grade  Occupational History   Occupation: disabled Clinical biochemist  Tobacco Use   Smoking status: Every Day    Packs/day: 0.50    Years: 30.00    Pack years: 15.00    Types: Cigarettes   Smokeless tobacco: Never  Vaping Use   Vaping Use: Never used  Substance and Sexual Activity   Alcohol use: Yes   Drug use: No   Sexual activity: Yes   Other Topics Concern   Not on file  Social History Narrative   ** Merged History Encounter **       Social Determinants of Health   Financial Resource Strain: Not on file  Food Insecurity: Not on file  Transportation Needs: Not on file  Physical Activity: Not on file  Stress: Not on file  Social Connections: Not on file  Intimate Partner Violence: Not on file    Outpatient Medications Prior to Visit  Medication Sig Dispense Refill   albuterol (PROVENTIL) (2.5 MG/3ML) 0.083% nebulizer solution NEBULIZE 1 VIAL EVERY 6 HOURS AS NEEDED FOR WHEEZING OR SHORTNESS OF BREATH 180 mL 0   albuterol (VENTOLIN HFA) 108 (90 Base) MCG/ACT inhaler INHALE 2 PUFFS INTO THE LUNGS EVERY 6 (SIX) HOURS AS NEEDED FOR WHEEZING. 8.5 g 0   atorvastatin (LIPITOR) 80 MG tablet TAKE 1 TABLET DAILY 30 tablet 4   baclofen (LIORESAL) 10 MG tablet Take 10 mg by mouth 2 (two) times daily as needed.     BELBUCA 450 MCG FILM Take 1 Film by mouth in the morning  and at bedtime.     blood glucose meter kit and supplies Dispense based on patient and insurance preference. Test sugar BID and as needed 1 each 0   CONTOUR NEXT TEST test strip Check blood sugar twice daily Dx E11.9 200 strip 3   dapagliflozin propanediol (FARXIGA) 10 MG TABS tablet Take 1 tablet (10 mg total) by mouth daily. 90 tablet 3   diclofenac (VOLTAREN) 75 MG EC tablet Take 1 tablet (75 mg total) by mouth 2 (two) times daily. 30 tablet 0   Dulaglutide (TRULICITY) 3 ZO/1.0RU SOPN Inject 1 mg as directed once a week. Discontinue 1.18m sq week dose     HYDROcodone-acetaminophen (NORCO) 7.5-325 MG tablet Take 1 tablet by mouth 3 (three) times daily as needed.     hydroxypropyl methylcellulose / hypromellose (ISOPTO TEARS / GONIOVISC) 2.5 % ophthalmic solution Place 1 drop into both eyes as needed for dry eyes.     insulin degludec (TRESIBA FLEXTOUCH) 200 UNIT/ML FlexTouch Pen Inject 40 Units into the skin at bedtime. 18 mL 0   Insulin Pen Needle (ULTICARE  MICRO PEN NEEDLES) 32G X 4 MM MISC Use twice daily Dx E11.40 200 each 3   levothyroxine (SYNTHROID) 175 MCG tablet TAKE (1) TABLET DAILY BEFORE BREAKFAST. 90 tablet 3   lisinopril (ZESTRIL) 20 MG tablet Take 1 tablet (20 mg total) by mouth daily. 90 tablet 2   metFORMIN (GLUCOPHAGE-XR) 750 MG 24 hr tablet TAKE 2 TABLETS DAILY WITH MORNING MEAL 180 tablet 0   omeprazole (PRILOSEC) 20 MG capsule TAKE (1) CAPSULE DAILY 90 capsule 0   PARoxetine (PAXIL) 40 MG tablet Take 1 tablet (40 mg total) by mouth daily. 90 tablet 0   pregabalin (LYRICA) 150 MG capsule TAKE 2 CAPSULES EVERY MORNING AND 2 CAPSULES EVERY EVENING 120 capsule 2   TRELEGY ELLIPTA 100-62.5-25 MCG/INH AEPB INHALE 1 ONCE A DAY AS DIRECTED 60 each 2   zolpidem (AMBIEN) 10 MG tablet Take 1 tablet (10 mg total) by mouth at bedtime. 30 tablet 5   No facility-administered medications prior to visit.    Allergies  Allergen Reactions   Hydrocodone-Acetaminophen Itching and Other (See Comments)    Pt is able to tolerate with Benadryl.    Gadolinium Derivatives Nausea And Vomiting    Review of Systems  Constitutional: Negative.   HENT: Negative.    Respiratory: Negative.    Cardiovascular: Negative.   Gastrointestinal: Negative.   Skin:  Positive for rash.  All other systems reviewed and are negative.     Objective:    Physical Exam Vitals reviewed.  Constitutional:      Appearance: Normal appearance.  HENT:     Head: Normocephalic.     Nose: Nose normal.  Eyes:     Conjunctiva/sclera: Conjunctivae normal.  Cardiovascular:     Rate and Rhythm: Normal rate and regular rhythm.     Pulses: Normal pulses.     Heart sounds: Normal heart sounds.  Pulmonary:     Effort: Pulmonary effort is normal.     Breath sounds: Normal breath sounds.  Abdominal:     General: Bowel sounds are normal.  Skin:    General: Skin is warm.     Findings: Abscess present.          Comments: Open abscess   Neurological:     Mental Status:  He is alert and oriented to person, place, and time.    BP 98/73   Pulse (!) 101   Temp (!) 97.5  F (36.4 C) (Temporal)   Ht 5' 7" (1.702 m)   Wt 201 lb 12.8 oz (91.5 kg)   BMI 31.61 kg/m  Wt Readings from Last 3 Encounters:  07/30/20 201 lb 12.8 oz (91.5 kg)  06/30/20 211 lb 6.4 oz (95.9 kg)  05/06/20 217 lb (98.4 kg)    Health Maintenance Due  Topic Date Due   Pneumococcal Vaccine 42-7 Years old (1 - PCV) Never done   Zoster Vaccines- Shingrix (1 of 2) Never done   OPHTHALMOLOGY EXAM  05/02/2019   COVID-19 Vaccine (3 - Moderna risk series) 10/10/2019   FOOT EXAM  06/28/2020    There are no preventive care reminders to display for this patient.   Lab Results  Component Value Date   TSH 0.672 05/06/2020   Lab Results  Component Value Date   WBC 9.3 05/06/2020   HGB 18.5 (H) 05/06/2020   HCT 54.2 (H) 05/06/2020   MCV 95 05/06/2020   PLT 207 05/06/2020   Lab Results  Component Value Date   NA 135 05/06/2020   K 4.0 05/06/2020   CO2 25 05/06/2020   GLUCOSE 328 (H) 05/06/2020   BUN 17 05/06/2020   CREATININE 0.95 05/06/2020   BILITOT 0.3 05/06/2020   ALKPHOS 113 05/06/2020   AST 17 05/06/2020   ALT 19 05/06/2020   PROT 6.4 05/06/2020   ALBUMIN 4.2 05/06/2020   CALCIUM 9.9 05/06/2020   ANIONGAP 8 01/23/2020   EGFR 96 05/06/2020   Lab Results  Component Value Date   CHOL 157 04/01/2020   Lab Results  Component Value Date   HDL 52 04/01/2020   Lab Results  Component Value Date   LDLCALC 65 04/01/2020   Lab Results  Component Value Date   TRIG 252 (H) 04/01/2020   Lab Results  Component Value Date   CHOLHDL 3.0 04/01/2020   Lab Results  Component Value Date   HGBA1C 8.7 (H) 06/30/2020       Assessment & Plan:   Problem List Items Addressed This Visit       Other   Abscess - Primary    Windows open abscess with alcohol, applied triple antibiotic cream covered with Band-Aid.  Started patient on Keflex 500 mg tablet by mouth twice  daily.  Follow-up as needed with worsening symptoms.  Patient cannot remember how long tick was stuck in skin, advised patient to return with flulike symptoms.  Take medication as prescribed follow-up with unresolved symptoms.       Relevant Medications   cephALEXin (KEFLEX) 500 MG capsule     Meds ordered this encounter  Medications   cephALEXin (KEFLEX) 500 MG capsule    Sig: Take 1 capsule (500 mg total) by mouth 2 (two) times daily.    Dispense:  14 capsule    Refill:  0    Order Specific Question:   Supervising Provider    Answer:   Janora Norlander [[id number]]     Ivy Lynn, NP

## 2020-07-30 NOTE — Patient Instructions (Signed)
Skin Abscess ?A skin abscess is an infected area of your skin that contains pus and other material. An abscess can happen in any part of your body. Some abscesses break open (rupture) on their own. Most continue to get worse unless they are treated. The infection can spread deeper into the body and into your blood, which can make you feel sick. ?A skin abscess is caused by germs that enter the skin through a cut or scrape. It can also be caused by blocked oil and sweat glands or infected hair follicles. ?This condition is usually treated by: ?Draining the pus. ?Taking antibiotic medicines. ?Placing a warm, wet washcloth over the abscess. ?Follow these instructions at home: ?Medicines ? ?Take over-the-counter and prescription medicines only as told by your doctor. ?If you were prescribed an antibiotic medicine, take it as told by your doctor. Do not stop taking the antibiotic even if you start to feel better. ?Abscess care ? ?If you have an abscess that has not drained, place a warm, clean, wet washcloth over the abscess several times a day. Do this as told by your doctor. ?Follow instructions from your doctor about how to take care of your abscess. Make sure you: ?Cover the abscess with a bandage (dressing). ?Change your bandage or gauze as told by your doctor. ?Wash your hands with soap and water before you change the bandage or gauze. If you cannot use soap and water, use hand sanitizer. ?Check your abscess every day for signs that the infection is getting worse. Check for: ?More redness, swelling, or pain. ?More fluid or blood. ?Warmth. ?More pus or a bad smell. ?General instructions ?To avoid spreading the infection: ?Do not share personal care items, towels, or hot tubs with others. ?Avoid making skin-to-skin contact with other people. ?Keep all follow-up visits as told by your doctor. This is important. ?Contact a doctor if: ?You have more redness, swelling, or pain around your abscess. ?You have more fluid or  blood coming from your abscess. ?Your abscess feels warm when you touch it. ?You have more pus or a bad smell coming from your abscess. ?You have a fever. ?Your muscles ache. ?You have chills. ?You feel sick. ?Get help right away if: ?You have very bad (severe) pain. ?You see red streaks on your skin spreading away from the abscess. ?Summary ?A skin abscess is an infected area of your skin that contains pus and other material. ?The abscess is caused by germs that enter the skin through a cut or scrape. It can also be caused by blocked oil and sweat glands or infected hair follicles. ?Follow your doctor's instructions on caring for your abscess, taking medicines, preventing infections, and keeping follow-up visits. ?This information is not intended to replace advice given to you by your health care provider. Make sure you discuss any questions you have with your health care provider. ?Document Revised: 09/07/2018 Document Reviewed: 03/17/2017 ?Elsevier Patient Education ? 2022 [employer] ? ?

## 2020-07-31 ENCOUNTER — Other Ambulatory Visit: Payer: Self-pay | Admitting: Family

## 2020-07-31 ENCOUNTER — Ambulatory Visit: Payer: Medicare PPO | Admitting: Family

## 2020-07-31 NOTE — Assessment & Plan Note (Signed)
Windows open abscess with alcohol, applied triple antibiotic cream covered with Band-Aid.  Started patient on Keflex 500 mg tablet by mouth twice daily.  Follow-up as needed with worsening symptoms.  Patient cannot remember how long tick was stuck in skin, advised patient to return with flulike symptoms.  Take medication as prescribed follow-up with unresolved symptoms.

## 2020-08-13 ENCOUNTER — Ambulatory Visit (INDEPENDENT_AMBULATORY_CARE_PROVIDER_SITE_OTHER): Payer: Medicare PPO

## 2020-08-13 VITALS — Ht 67.0 in | Wt 207.0 lb

## 2020-08-13 DIAGNOSIS — Z0000 Encounter for general adult medical examination without abnormal findings: Secondary | ICD-10-CM

## 2020-08-13 NOTE — Progress Notes (Signed)
Subjective:   Charles Pennington is a 55 y.o. male who presents for Medicare Annual/Subsequent preventive examination.  Virtual Visit via Telephone Note  I connected with  Charles Pennington on 08/13/20 at 11:15 AM EDT by telephone and verified that I am speaking with the correct person using two identifiers.  Location: Patient: Home Provider: [facility] Persons participating in the virtual visit: patient/Nurse Health Advisor   I discussed the limitations, risks, security and privacy concerns of performing an evaluation and management service by telephone and the availability of in person appointments. The patient expressed understanding and agreed to proceed.  Interactive audio and video telecommunications were attempted between this nurse and patient, however failed, due to patient having technical difficulties OR patient did not have access to video capability.  We continued and completed visit with audio only.  Some vital signs may be absent or patient reported.   Charles Pennington Charles Jerrico Covello, LPN   Review of Systems     Cardiac Risk Factors include: advanced age (>39mn, >>25women);sedentary lifestyle;obesity (BMI >30kg/m2);smoking/ tobacco exposure;diabetes mellitus;dyslipidemia;hypertension;male gender     Objective:    Today's Vitals   08/13/20 1112 08/13/20 1113  Weight: 207 lb (93.9 kg)   Height: 5' 7"  (1.702 m)   PainSc:  9    Body mass index is 32.42 kg/m.  Advanced Directives 08/13/2020 03/18/2020 01/22/2020 10/31/2019 08/09/2019 08/07/2018 11/29/2017  Does Patient Have a Medical Advance Directive? No No No No No No No  Would patient like information on creating a medical advance directive? No - Patient declined - No - Patient declined - No - Patient declined Yes (MAU/Ambulatory/Procedural Areas - Information given) Yes (ED - Information included in AVS)  Some encounter information is confidential and restricted. Go to Review Flowsheets activity to see all data.    Current Medications  (verified) Outpatient Encounter Medications as of 08/13/2020  Medication Sig   albuterol (PROVENTIL) (2.5 MG/3ML) 0.083% nebulizer solution NEBULIZE 1 VIAL EVERY 6 HOURS AS NEEDED FOR WHEEZING OR SHORTNESS OF BREATH   albuterol (VENTOLIN HFA) 108 (90 Base) MCG/ACT inhaler INHALE 2 PUFFS INTO THE LUNGS EVERY 6 (SIX) HOURS AS NEEDED FOR WHEEZING.   atorvastatin (LIPITOR) 80 MG tablet TAKE 1 TABLET DAILY   baclofen (LIORESAL) 10 MG tablet Take 10 mg by mouth 2 (two) times daily as needed.   BELBUCA 450 MCG FILM Take 1 Film by mouth in the morning and at bedtime.   blood glucose meter kit and supplies Dispense based on patient and insurance preference. Test sugar BID and as needed   cephALEXin (KEFLEX) 500 MG capsule Take 1 capsule (500 mg total) by mouth 2 (two) times daily.   CONTOUR NEXT TEST test strip Check blood sugar twice daily Dx E11.9   dapagliflozin propanediol (FARXIGA) 10 MG TABS tablet Take 1 tablet (10 mg total) by mouth daily.   diclofenac (VOLTAREN) 75 MG EC tablet Take 1 tablet (75 mg total) by mouth 2 (two) times daily.   Dulaglutide (TRULICITY) 3 MIP/3.8SNSOPN Inject 1 mg as directed once a week. Discontinue 1.545msq week dose   HYDROcodone-acetaminophen (NORCO) 7.5-325 MG tablet Take 1 tablet by mouth 3 (three) times daily as needed.   hydroxypropyl methylcellulose / hypromellose (ISOPTO TEARS / GONIOVISC) 2.5 % ophthalmic solution Place 1 drop into both eyes as needed for dry eyes.   insulin degludec (TRESIBA FLEXTOUCH) 200 UNIT/ML FlexTouch Pen Inject 40 Units into the skin at bedtime.   Insulin Pen Needle (ULTICARE MICRO PEN NEEDLES) 32G X  4 MM MISC Use twice daily Dx E11.40   levothyroxine (SYNTHROID) 175 MCG tablet TAKE (1) TABLET DAILY BEFORE BREAKFAST.   lisinopril (ZESTRIL) 20 MG tablet Take 1 tablet (20 mg total) by mouth daily.   metFORMIN (GLUCOPHAGE-XR) 750 MG 24 hr tablet TAKE 2 TABLETS DAILY WITH MORNING MEAL   omeprazole (PRILOSEC) 20 MG capsule TAKE (1) CAPSULE  DAILY   PARoxetine (PAXIL) 40 MG tablet Take 1 tablet (40 mg total) by mouth daily.   pregabalin (LYRICA) 150 MG capsule TAKE 2 CAPSULES EVERY MORNING AND 2 CAPSULES EVERY EVENING   TRELEGY ELLIPTA 100-62.5-25 MCG/INH AEPB INHALE 1 ONCE A DAY AS DIRECTED   zolpidem (AMBIEN) 10 MG tablet Take 1 tablet (10 mg total) by mouth at bedtime.   No facility-administered encounter medications on file as of 08/13/2020.    Allergies (verified) Hydrocodone-acetaminophen and Gadolinium derivatives   History: Past Medical History:  Diagnosis Date   Anxiety    Asthma    Diabetes mellitus    Diabetes mellitus without complication (Charles Pennington)    Hypercholesteremia    Hypertension    Hypothyroidism    Low back pain    Obesity    SOB (shortness of breath)    Stroke Charles Pennington) age 69   Stroke [facility])    Vertigo    Past Surgical History:  Procedure Laterality Date   BACK SURGERY  2013   COLONOSCOPY  2009   Inflammatory changes of the cecum and ascending colon most consistent with infectious etiology, NSAID, ischemia. Suspected resolving infection based on symptomatology.   FOOT SURGERY     Dr Charles Pennington   SHOULDER ARTHROSCOPY Right    SPINE SURGERY     WRIST SURGERY     Family History  Problem Relation Age of Onset   Hypertension Father    Suicidality Father    Diabetes Father    Diabetes Mother    Colon cancer Neg Hx    Inflammatory bowel disease Neg Hx    Social History   Socioeconomic History   Marital status: Single    Spouse name: Not on file   Number of children: 2   Years of education: Not on file   Highest education level: 8th grade  Occupational History   Occupation: disabled Clinical biochemist  Tobacco Use   Smoking status: Every Day    Packs/day: 1.00    Years: 30.00    Pack years: 30.00    Types: Cigarettes   Smokeless tobacco: Never  Vaping Use   Vaping Use: Never used  Substance and Sexual Activity   Alcohol use: Never   Drug use: No   Sexual activity: Yes  Other Topics Concern    Not on file  Social History Narrative   ** Merged History Encounter **    His mom lives with him. Children live nearby   Social Determinants of Health   Financial Resource Strain: High Risk   Difficulty of Paying Living Expenses: Hard  Food Insecurity: Food Insecurity Present   Worried About Charity fundraiser in the Last Year: Often true   Arboriculturist in the Last Year: Sometimes true  Transportation Needs: Not on file  Physical Activity: Inactive   Days of Exercise per Week: 0 days   Minutes of Exercise per Session: 0 min  Stress: No Stress Concern Present   Feeling of Stress : Only a little  Social Connections: Socially Isolated   Frequency of Communication with Friends and Family: More than three times a  week   Frequency of Social Gatherings with Friends and Family: More than three times a week   Attends Religious Services: Never   Marine scientist or Organizations: No   Attends Music therapist: Never   Marital Status: Never married    Tobacco Counseling Ready to quit: Not Answered Counseling given: Not Answered   Clinical Intake:  Pre-visit preparation completed: Yes  Pain : 0-10 Pain Score: 9  Pain Type: Chronic pain Pain Location: Back Pain Descriptors / Indicators: Aching, Sore, Constant Pain Onset: More than a month ago Pain Frequency: Constant     BMI - recorded: 32.42 Nutritional Status: BMI > 30  Obese Nutritional Risks: None Diabetes: Yes CBG done?: No Did pt. bring in CBG monitor from home?: No  How often do you need to have someone help you when you read instructions, pamphlets, or other written materials from your doctor or pharmacy?: 1 - Never  Nutrition Risk Assessment:  Has the patient had any N/V/D within the last 2 months?  No  Does the patient have any non-healing wounds?  No  Has the patient had any unintentional weight loss or weight gain?  Yes   Diabetes:  Is the patient diabetic?  Yes  If diabetic,  was a CBG obtained today?  No  Did the patient bring in their glucometer from home?  No  How often do you monitor your CBG's? Fasting daily when he thinks about it.   Financial Strains and Diabetes Management:  Are you having any financial strains with the device, your supplies or your medication? No .  Does the patient want to be seen by Chronic Care Management for management of their diabetes?  No  Would the patient like to be referred to a Nutritionist or for Diabetic Management?  No   Diabetic Exams:  Diabetic Eye Exam: Completed 05/02/2018. Overdue for diabetic eye exam. Pt has been advised about the importance in completing this exam. He plans to make appt at [facility] soon  Diabetic Foot Exam: Completed 06/29/2019. Pt has been advised about the importance in completing this exam. Pt is scheduled for diabetic foot exam on 08/14/2020.    Interpreter Needed?: No  Information entered by :: Ace Bergfeld, LPN   Activities of Daily Living In your present state of health, do you have any difficulty performing the following activities: 08/13/2020  Hearing? N  Vision? N  Difficulty concentrating or making decisions? Y  Walking or climbing stairs? Y  Dressing or bathing? N  Doing errands, shopping? N  Preparing Food and eating ? N  Using the Toilet? N  In the past six months, have you accidently leaked urine? N  Do you have problems with loss of bowel control? N  Managing your Medications? N  Managing your Finances? N  Housekeeping or managing your Housekeeping? N  Some recent data might be hidden    Patient Care Team: Sharion Balloon, FNP as PCP - General (Family Medicine) Sharion Balloon, FNP (Nurse Practitioner) Ilean China, RN as Registered Nurse [place], Royce Macadamia, [place] (Pharmacist) Celestia Khat, [place] (Optometry) Jeanella Anton, NP as Nurse Practitioner (Pain Medicine)  Indicate any recent Medical Services you may have received from other than [facility] providers in the past  year (date may be approximate).     Assessment:   This is a routine wellness examination for Deven.  Hearing/Vision screen Hearing Screening - Comments:: Denies hearing difficulties  Vision Screening - Comments:: C/o vision worsening - wears  eyeglasses prn - behind on eye exam with Dr Wynetta Emery at [facility] in [place] - plans to make appt soon  Dietary issues and exercise activities discussed: Current Exercise Habits: The patient does not participate in regular exercise at present, Type of exercise: walking, Time (Minutes): 10, Frequency (Times/Week): 7, Weekly Exercise (Minutes/Week): 70, Intensity: Mild, Exercise limited by: orthopedic condition(s);psychological condition(s);respiratory conditions(s)   Goals Addressed               This Visit's Progress     Increase Physical Activity (pt-stated)   Not on track     Decreased physical activity in a patient with diabetes and hypertension.   Current Barriers:  Knowledge Deficits related to ways to safely increase physical activity Cognitive Deficits Shoulder pain  Nurse Case Manager Clinical Goal(s):  Over the next 60 days, patient will work with RN Care Manager to address needs related to physical activity.   Interventions:  Advised patient to stay within comfort zone with physical activity and to stop any activity that causes pain Provided education to patient re: home exercise Resources printed and to be mailed Discussed plans with patient for ongoing care management follow up and provided patient with direct contact information for care management team  Patient Self Care Activities:  Performs ADL's independently Performs IADL's independently  Initial goal documentation        LIFESTYLE - DECREASE FALLS RISK        Still falling with cane - consider using walker - try seated leg raises and balance exercises to strengthen legs and help prevent falls.        Depression Screen PHQ 2/9 Scores 08/13/2020 06/30/2020  06/09/2020 05/06/2020 02/01/2020 01/14/2020 01/08/2020  PHQ - 2 Score 2 4 0 0 0 0 0  PHQ- 9 Score 7 16 - 0 - - -    Fall Risk Fall Risk  08/13/2020 02/01/2020 01/14/2020 01/08/2020 10/16/2019  Falls in the past year? 1 1 0 0 0  Comment too many falls to count, has hurt shoulder - plans to have PT soon - - - -  Number falls in past yr: 1 1 - - -  Injury with Fall? 1 1 - - -  Comment - - - - -  Risk Factor Category  - - - - -  Risk for fall due to : History of fall(s);Impaired balance/gait;Impaired vision;Medication side effect;Orthopedic patient Impaired balance/gait - - -  Follow up Falls prevention discussed;Education provided Education provided - - -    FALL RISK PREVENTION PERTAINING TO THE HOME:  Any stairs in or around the home? Yes  If so, are there any without handrails? No  Home free of loose throw rugs in walkways, pet beds, electrical cords, etc? Yes  Adequate lighting in your home to reduce risk of falls? Yes   ASSISTIVE DEVICES UTILIZED TO PREVENT FALLS:  Life alert? No  Use of a cane, walker or w/c? Yes  Grab bars in the bathroom? Yes  Shower chair or bench in shower? No  Elevated toilet seat or a handicapped toilet? No   TIMED UP AND GO:  Was the test performed? No . Telephonic visit  Cognitive Function:     6CIT Screen 08/13/2020 08/09/2019 08/07/2018  What Year? 0 points 4 points 0 points  What month? 0 points 0 points 0 points  What time? 0 points 0 points 0 points  Count back from 20 0 points 0 points 0 points  Months in reverse 4 points 4 points 4  points  Repeat phrase 4 points 6 points 0 points  Total Score 8 14 4     Immunizations Immunization History  Administered Date(s) Administered   Influenza,inj,Quad PF,6+ Mos 03/15/2014, 01/26/2018   Influenza-Unspecified 03/23/2017   Moderna Sars-Covid-2 Vaccination 08/15/2019, 09/12/2019   Pneumococcal Conjugate-13 12/05/2014, 10/05/2019   Pneumococcal Polysaccharide-23 12/05/2014   Tdap 04/04/2016     TDAP status: Up to date  Flu Vaccine status: Due, Education has been provided regarding the importance of this vaccine. Advised may receive this vaccine at local pharmacy or Health Dept. Aware to provide a copy of the vaccination record if obtained from local pharmacy or Health Dept. Verbalized acceptance and understanding.  Pneumococcal vaccine status: Up to date  Covid-19 vaccine status: Completed vaccines  Qualifies for Shingles Vaccine? Yes   Zostavax completed No   Shingrix Completed?: No.    Education has been provided regarding the importance of this vaccine. Patient has been advised to call insurance company to determine out of pocket expense if they have not yet received this vaccine. Advised may also receive vaccine at local pharmacy or Health Dept. Verbalized acceptance and understanding.  Screening Tests Health Maintenance  Topic Date Due   Pneumococcal Vaccine 61-65 Years old (1 - PCV) Never done   Zoster Vaccines- Shingrix (1 of 2) Never done   OPHTHALMOLOGY EXAM  05/02/2019   COVID-19 Vaccine (3 - Moderna risk series) 10/10/2019   FOOT EXAM  06/28/2020   COLONOSCOPY (Pts 45-48yr Insurance coverage will need to be confirmed)  01/31/2021 (Originally 06/09/2011)   INFLUENZA VACCINE  09/15/2020   HEMOGLOBIN A1C  12/31/2020   TETANUS/TDAP  04/04/2026   PNEUMOCOCCAL POLYSACCHARIDE VACCINE AGE 64-64 HIGH RISK  Completed   Hepatitis C Screening  Completed   HIV Screening  Completed   HPV VACCINES  Aged Out    Health Maintenance  Health Maintenance Due  Topic Date Due   Pneumococcal Vaccine 043623Years old (1 - PCV) Never done   Zoster Vaccines- Shingrix (1 of 2) Never done   OPHTHALMOLOGY EXAM  05/02/2019   COVID-19 Vaccine (3 - Moderna risk series) 10/10/2019   FOOT EXAM  06/28/2020    Colorectal Cancer Screening: Declines Colonoscopy and Cologuard  Lung Cancer Screening: (Low Dose CT Chest recommended if Age 54-80years, 20 pack-year currently smoking OR have  quit w/in 15years.) does qualify.   Lung Cancer Screening Referral: Needs referral: last chest CT showed nodules in 2018 - will discuss with Christy  Additional Screening:  Hepatitis C Screening: does qualify; Completed 10/05/2019  Vision Screening: Recommended annual ophthalmology exams for early detection of glaucoma and other disorders of the eye. Is the patient up to date with their annual eye exam?  No  Who is the provider or what is the name of the office in which the patient attends annual eye exams[place] [facility] [place] not established with a provider, would they like to be referred to a provider to establish care? No .   Dental Screening: Recommended annual dental exams for proper oral hygiene  Community Resource Referral / Chronic Care Management: CRR required this visit?  Yes   CCM required this visit?  No      Plan:     I have personally reviewed and noted the following in the patient's chart:   Medical and social history Use of alcohol, tobacco or illicit drugs  Current medications and supplements including opioid prescriptions. Patient is currently taking opioid prescriptions. Information provided to patient regarding non-opioid  alternatives. Patient advised to discuss non-opioid treatment plan with their provider. Functional ability and status Nutritional status Physical activity Advanced directives List of other physicians Hospitalizations, surgeries, and ER visits in previous 12 months Vitals Screenings to include cognitive, depression, and falls Referrals and appointments  In addition, I have reviewed and discussed with patient certain preventive protocols, quality metrics, and best practice recommendations. A written personalized care plan for preventive services as well as general preventive health recommendations were provided to patient.     Sandrea Hammond, LPN   [id number]   Nurse Notes: Has appt tomorrow and wanted to wait to discuss with PCP:  low dose chest CT, covid booster vaccine and shingles vaccines

## 2020-08-13 NOTE — Patient Instructions (Addendum)
Charles Pennington , Thank you for taking time to come for your Medicare Wellness Visit. I appreciate your ongoing commitment to your health goals. Please review the following plan we discussed and let me know if I can assist you in the future.   Screening recommendations/referrals: Colonoscopy: Declined Recommended yearly ophthalmology/optometry visit for glaucoma screening and checkup Recommended yearly dental visit for hygiene and checkup  Vaccinations: Influenza vaccine: Declined Pneumococcal vaccine: Done 12/05/2014 & 10/05/2019 Tdap vaccine: Done 04/04/2016 - Repeat in 10 years Shingles vaccine: Due. Shingrix discussed. Please contact your pharmacy for coverage information.    Covid-19: Done 08/15/2019 & 09/12/2019  Advanced directives: Advance directive discussed with you today. Even though you declined this today, please call our office should you change your mind, and we can give you the proper paperwork for you to fill out.  Conditions/risks identified: Aim for 30 minutes of exercise or brisk walking each day, drink 6-8 glasses of water and eat lots of fruits and vegetables.  Next appointment: Follow up in one year for your annual wellness visit.   Preventive Care 54 Years and Older, Male  Preventive care refers to lifestyle choices and visits with your health care provider that can promote health and wellness. What does preventive care include? A yearly physical exam. This is also called an annual well check. Dental exams once or twice a year. Routine eye exams. Ask your health care provider how often you should have your eyes checked. Personal lifestyle choices, including: Daily care of your teeth and gums. Regular physical activity. Eating a healthy diet. Avoiding tobacco and drug use. Limiting alcohol use. Practicing safe sex. Taking low doses of aspirin every day. Taking vitamin and mineral supplements as recommended by your health care provider. What happens during an  annual well check? The services and screenings done by your health care provider during your annual well check will depend on your age, overall health, lifestyle risk factors, and family history of disease. Counseling  Your health care provider may ask you questions about your: Alcohol use. Tobacco use. Drug use. Emotional well-being. Home and relationship well-being. Sexual activity. Eating habits. History of falls. Memory and ability to understand (cognition). Work and work Statistician. Screening  You may have the following tests or measurements: Height, weight, and BMI. Blood pressure. Lipid and cholesterol levels. These may be checked every 5 years, or more frequently if you are over 54 years old. Skin check. Lung cancer screening. You may have this screening every year starting at age 54 if you have a 30-pack-year history of smoking and currently smoke or have quit within the past 15 years. Fecal occult blood test (FOBT) of the stool. You may have this test every year starting at age 54. Flexible sigmoidoscopy or colonoscopy. You may have a sigmoidoscopy every 5 years or a colonoscopy every 10 years starting at age 54. Prostate cancer screening. Recommendations will vary depending on your family history and other risks. Hepatitis C blood test. Hepatitis B blood test. Sexually transmitted disease (STD) testing. Diabetes screening. This is done by checking your blood sugar (glucose) after you have not eaten for a while (fasting). You may have this done every 1-3 years. Abdominal aortic aneurysm (AAA) screening. You may need this if you are a current or former smoker. Osteoporosis. You may be screened starting at age 54 if you are at high risk. Talk with your health care provider about your test results, treatment options, and if necessary, the need for more tests. Vaccines  Your health care provider may recommend certain vaccines, such as: Influenza vaccine. This is recommended  every year. Tetanus, diphtheria, and acellular pertussis (Tdap, Td) vaccine. You may need a Td booster every 10 years. Zoster vaccine. You may need this after age 54. Pneumococcal 13-valent conjugate (PCV13) vaccine. One dose is recommended after age 1. Pneumococcal polysaccharide (PPSV23) vaccine. One dose is recommended after age 55. Talk to your health care provider about which screenings and vaccines you need and how often you need them. This information is not intended to replace advice given to you by your health care provider. Make sure you discuss any questions you have with your health care provider. Document Released: 02/28/2015 Document Revised: 10/22/2015 Document Reviewed: 12/03/2014 Elsevier Interactive Patient Education  2017 [place] Prevention in the Home Falls can cause injuries. They can happen to people of all ages. There are many things you can do to make your home safe and to help prevent falls. What can I do on the outside of my home? Regularly fix the edges of walkways and driveways and fix any cracks. Remove anything that might make you trip as you walk through a door, such as a raised step or threshold. Trim any bushes or trees on the path to your home. Use bright outdoor lighting. Clear any walking paths of anything that might make someone trip, such as rocks or tools. Regularly check to see if handrails are loose or broken. Make sure that both sides of any steps have handrails. Any raised decks and porches should have guardrails on the edges. Have any leaves, snow, or ice cleared regularly. Use sand or salt on walking paths during winter. Clean up any spills in your garage right away. This includes oil or grease spills. What can I do in the bathroom? Use night lights. Install grab bars by the toilet and in the tub and shower. Do not use towel bars as grab bars. Use non-skid mats or decals in the tub or shower. If you need to sit down in the shower,  use a plastic, non-slip stool. Keep the floor dry. Clean up any water that spills on the floor as soon as it happens. Remove soap buildup in the tub or shower regularly. Attach bath mats securely with double-sided non-slip rug tape. Do not have throw rugs and other things on the floor that can make you trip. What can I do in the bedroom? Use night lights. Make sure that you have a light by your bed that is easy to reach. Do not use any sheets or blankets that are too big for your bed. They should not hang down onto the floor. Have a firm chair that has side arms. You can use this for support while you get dressed. Do not have throw rugs and other things on the floor that can make you trip. What can I do in the kitchen? Clean up any spills right away. Avoid walking on wet floors. Keep items that you use a lot in easy-to-reach places. If you need to reach something above you, use a strong step stool that has a grab bar. Keep electrical cords out of the way. Do not use floor polish or wax that makes floors slippery. If you must use wax, use non-skid floor wax. Do not have throw rugs and other things on the floor that can make you trip. What can I do with my stairs? Do not leave any items on the stairs. Make sure that there are  handrails on both sides of the stairs and use them. Fix handrails that are broken or loose. Make sure that handrails are as long as the stairways. Check any carpeting to make sure that it is firmly attached to the stairs. Fix any carpet that is loose or worn. Avoid having throw rugs at the top or bottom of the stairs. If you do have throw rugs, attach them to the floor with carpet tape. Make sure that you have a light switch at the top of the stairs and the bottom of the stairs. If you do not have them, ask someone to add them for you. What else can I do to help prevent falls? Wear shoes that: Do not have high heels. Have rubber bottoms. Are comfortable and fit you  well. Are closed at the toe. Do not wear sandals. If you use a stepladder: Make sure that it is fully opened. Do not climb a closed stepladder. Make sure that both sides of the stepladder are locked into place. Ask someone to hold it for you, if possible. Clearly mark and make sure that you can see: Any grab bars or handrails. First and last steps. Where the edge of each step is. Use tools that help you move around (mobility aids) if they are needed. These include: Canes. Walkers. Scooters. Crutches. Turn on the lights when you go into a dark area. Replace any light bulbs as soon as they burn out. Set up your furniture so you have a clear path. Avoid moving your furniture around. If any of your floors are uneven, fix them. If there are any pets around you, be aware of where they are. Review your medicines with your doctor. Some medicines can make you feel dizzy. This can increase your chance of falling. Ask your doctor what other things that you can do to help prevent falls. This information is not intended to replace advice given to you by your health care provider. Make sure you discuss any questions you have with your health care provider. Document Released: 11/28/2008 Document Revised: 07/10/2015 Document Reviewed: 03/08/2014 Elsevier Interactive Patient Education  2017 [employer]  Managing Pain Without Opioids Opioids are strong medicines used to treat moderate to severe pain. For some people, especially those who have long-term (chronic) pain, opioids may not be the best choice for pain management due to: Side effects like nausea, constipation, and sleepiness. The risk of addiction (opioid use disorder). The longer you take opioids, the greater your risk of addiction. Pain that lasts for more than 3 months is called chronic pain. Managing chronic pain usually requires more than one approach and is often provided by a team of health care providers working together  (multidisciplinary approach). Pain management may be done at a pain management center or pain clinic. Types of pain management without opioids Managing pain without opioids can involve: Non-opioid medicines. Exercises to help relieve pain and improve strength and range of motion (physical therapy). Therapy to help with everyday tasks and activities (occupational therapy). Therapy to help you find ways to relieve pain by doing things you enjoy (recreational therapy). Talk therapy (psychotherapy) and other mental health therapies. Medical treatments such as injections or devices. Making lifestyle changes. Pain management options Non-opioid medicines Non-opioid medicines for pain may include medicines taken by mouth (oral medicines), such as: Over-the-counter or prescription NSAIDs. These may be the first medicines used for pain. They work well for muscle and bone pain, and they reduce swelling. Acetaminophen. This over-the-counter medicine may work  well for milder pain but not swelling. Antidepressants. These may be used to treat chronic pain. A certain type of antidepressant (tricyclics) is often used. These medicines are given in lower doses for pain than when used for depression. Anticonvulsants. These are usually used to treat seizures but may also reduce nerve (neuropathic) pain. Muscle relaxants. These relieve pain caused by sudden muscle tightening (spasms). You may also use a type of pain medicine that is applied to the skin as a patch, cream, or gel (topical analgesic), such as a numbing medicine. These may cause fewer side effects than oralmedicines. Therapy Physical therapy involves doing exercises to gain strength and flexibility. A physical therapist may teach you exercises to move and stretch parts of your body that are weak, stiff, or painful. You can learn these exercises at physical therapy visits and practice them at home. Physical therapy may also involve: Massage. Heat wraps  or applying heat or cold to affected areas. Sending electrical signals through the skin to interrupt pain signals (transcutaneous electrical nerve stimulation, TENS). Sending weak lasers through the skin to reduce pain and swelling (low-level laser therapy). Using signals from your body to help you learn to regulate pain (biofeedback). Occupational therapy helps you learn ways to function at home and work withless pain. Recreational therapy may involve trying new activities or hobbies, such asdrawing or a physical activity. Types of mental health therapy for pain include: Cognitive behavioral therapy (CBT) to help you learn coping skills for dealing with pain. Acceptance and commitment therapy (ACT) to change the way you think and react to pain. Relaxation therapies, including muscle relaxation exercises and focusing your mind on the present moment to lower stress (mindfulness-based stress reduction). Pain management counseling. This may be individual, family, or group counseling.  Medical treatments Medical treatments for pain management include: Nerve block injections. These may include a pain blocker and anti-inflammatory medicines. You may have injections: Near the spine to relieve chronic back or neck pain. Into joints to relieve back or joint pain. Into nerve areas that supply a painful area to relieve body pain. Into muscles (trigger point injections) to relieve some painful muscle conditions. A medical device placed near your spine to help block pain signals and relieve nerve pain or chronic back pain (spinal cord stimulation device). Acupuncture. Follow these instructions at home Medicines Take over-the-counter and prescription medicines only as told by your health care provider. If you are taking pain medicine, ask your health care providers about possible side effects to watch out for. Do not drive or use heavy machinery while taking prescription pain medicine. Lifestyle  Do  not use drugs or alcohol to reduce pain. Limit alcohol intake to no more than 1 drink a day for nonpregnant women and 2 drinks a day for men. One drink equals 12 oz of beer, 5 oz of wine, or 1 oz of hard liquor. Do not use any products that contain nicotine or tobacco, such as cigarettes and e-cigarettes. These can delay healing. If you need help quitting, ask your health care provider. Eat a healthy diet and maintain a healthy weight. Poor diet and excess weight may make pain worse. Eat foods that are high in fiber. These include fresh fruits and vegetables, whole grains, and beans. Limit foods that are high in fat and processed sugars, such as fried and sweet foods. Exercise regularly. Exercise lowers stress and may help relieve pain. Ask your health care provider what activities and exercises are safe for you. If your health  care provider approves, join an exercise class that combines movement and stress reduction. Examples include yoga and tai chi. Get enough sleep. Lack of sleep may make pain worse. Lower stress as much as possible. Practice stress reduction techniques as told by your therapist.  General instructions Work with all your pain management providers to find the treatments that work best for you. You are an important member of your pain management team. There are many things you can do to reduce pain on your own. Consider joining an online or in-person support group for people who have chronic pain. Keep all follow-up visits as told by your health care providers. This is important. Where to find more information You can find more information about managing pain without opioids from: American Academy of Pain Medicine: painmed.[place] for Chronic Pain: instituteforchronicpain.org American Chronic Pain Association: theacpa.org Contact a health care provider if: You have side effects from pain medicine. Your pain gets worse or does not get better with treatments or home  care. You are struggling with anxiety or depression. Summary Many types of pain can be managed without opioids. Chronic pain may respond better to pain management without opioids. Pain is best managed with a team of providers working together. Pain management without opioids may include non-opioid medicines, medical treatments, physical therapy, mental health therapy, and lifestyle changes. Tell your health care providers if your pain gets worse or is not being managed well enough. This information is not intended to replace advice given to you by your health care provider. Make sure you discuss any questions you have with your healthcare provider. Document Revised: 11/13/2019 Document Reviewed: 11/15/2019 Elsevier Patient Education  [place].

## 2020-08-14 ENCOUNTER — Encounter: Payer: Self-pay | Admitting: Family

## 2020-08-14 ENCOUNTER — Ambulatory Visit: Payer: Medicare PPO | Admitting: Family

## 2020-08-15 ENCOUNTER — Ambulatory Visit: Payer: Medicare PPO | Admitting: Family

## 2020-08-20 DIAGNOSIS — E119 Type 2 diabetes mellitus without complications: Secondary | ICD-10-CM | POA: Diagnosis not present

## 2020-08-20 DIAGNOSIS — F17200 Nicotine dependence, unspecified, uncomplicated: Secondary | ICD-10-CM | POA: Diagnosis not present

## 2020-08-20 DIAGNOSIS — G894 Chronic pain syndrome: Secondary | ICD-10-CM | POA: Diagnosis not present

## 2020-08-20 DIAGNOSIS — Z79899 Other long term (current) drug therapy: Secondary | ICD-10-CM | POA: Diagnosis not present

## 2020-08-20 DIAGNOSIS — F1120 Opioid dependence, uncomplicated: Secondary | ICD-10-CM | POA: Diagnosis not present

## 2020-08-20 DIAGNOSIS — Z6830 Body mass index (BMI) 30.0-30.9, adult: Secondary | ICD-10-CM | POA: Diagnosis not present

## 2020-08-20 DIAGNOSIS — R030 Elevated blood-pressure reading, without diagnosis of hypertension: Secondary | ICD-10-CM | POA: Diagnosis not present

## 2020-08-20 DIAGNOSIS — M5136 Other intervertebral disc degeneration, lumbar region: Secondary | ICD-10-CM | POA: Diagnosis not present

## 2020-08-20 DIAGNOSIS — F17210 Nicotine dependence, cigarettes, uncomplicated: Secondary | ICD-10-CM | POA: Diagnosis not present

## 2020-08-21 ENCOUNTER — Encounter: Payer: Self-pay | Admitting: Family

## 2020-08-26 ENCOUNTER — Encounter: Payer: Self-pay | Admitting: Family

## 2020-08-26 ENCOUNTER — Ambulatory Visit (INDEPENDENT_AMBULATORY_CARE_PROVIDER_SITE_OTHER): Payer: Medicare PPO | Admitting: Family

## 2020-08-26 ENCOUNTER — Other Ambulatory Visit: Payer: Self-pay

## 2020-08-26 VITALS — BP 114/75 | HR 102 | Temp 98.9°F | Ht 67.0 in | Wt 198.6 lb

## 2020-08-26 DIAGNOSIS — E1159 Type 2 diabetes mellitus with other circulatory complications: Secondary | ICD-10-CM | POA: Diagnosis not present

## 2020-08-26 DIAGNOSIS — E1169 Type 2 diabetes mellitus with other specified complication: Secondary | ICD-10-CM | POA: Diagnosis not present

## 2020-08-26 DIAGNOSIS — J441 Chronic obstructive pulmonary disease with (acute) exacerbation: Secondary | ICD-10-CM | POA: Diagnosis not present

## 2020-08-26 DIAGNOSIS — F1910 Other psychoactive substance abuse, uncomplicated: Secondary | ICD-10-CM

## 2020-08-26 DIAGNOSIS — G4700 Insomnia, unspecified: Secondary | ICD-10-CM | POA: Diagnosis not present

## 2020-08-26 DIAGNOSIS — E039 Hypothyroidism, unspecified: Secondary | ICD-10-CM | POA: Diagnosis not present

## 2020-08-26 DIAGNOSIS — E118 Type 2 diabetes mellitus with unspecified complications: Secondary | ICD-10-CM

## 2020-08-26 DIAGNOSIS — Z794 Long term (current) use of insulin: Secondary | ICD-10-CM

## 2020-08-26 DIAGNOSIS — I152 Hypertension secondary to endocrine disorders: Secondary | ICD-10-CM

## 2020-08-26 DIAGNOSIS — E119 Type 2 diabetes mellitus without complications: Secondary | ICD-10-CM

## 2020-08-26 DIAGNOSIS — F17200 Nicotine dependence, unspecified, uncomplicated: Secondary | ICD-10-CM

## 2020-08-26 DIAGNOSIS — Z9114 Patient's other noncompliance with medication regimen: Secondary | ICD-10-CM

## 2020-08-26 DIAGNOSIS — E785 Hyperlipidemia, unspecified: Secondary | ICD-10-CM

## 2020-08-26 DIAGNOSIS — Z91148 Patient's other noncompliance with medication regimen for other reason: Secondary | ICD-10-CM

## 2020-08-26 LAB — BAYER DCA HB A1C WAIVED: HB A1C (BAYER DCA - WAIVED): 8.9 % — ABNORMAL HIGH (ref <7.0)

## 2020-08-26 MED ORDER — PREGABALIN 150 MG PO CAPS: ORAL_CAPSULE | ORAL | 2 refills | Status: DC

## 2020-08-26 MED ORDER — ZOLPIDEM TARTRATE 10 MG PO TABS: 10.0000 mg | ORAL_TABLET | Freq: Every day | ORAL | 5 refills | Status: DC

## 2020-08-26 NOTE — Progress Notes (Signed)
Subjective:    Patient ID: Charles Pennington, male    DOB: 06-16-1965, 55 y.o.   MRN: [id number]  Chief Complaint  Patient presents with   Medical Management of Chronic Issues   Pt presents to the office today for chronic follow up. He is a poor historian. He is suppose to be scheduled for lumbar surgery, but needs his A1C below 8.    Pt had a CVA when he turned 31 years. PT states he continue to have "balance issues related to the CVA". PT states this is stable.   Pt is noncompliant with his medications and takes "most of his pills most days".     He is followed by Pain Clinic every month for chronic back pain and diabetic neuropathy. Hypertension This is a chronic problem. The current episode started more than 1 year ago. The problem has been resolved since onset. The problem is controlled. Associated symptoms include malaise/fatigue. Pertinent negatives include no blurred vision, peripheral edema or shortness of breath. Risk factors for coronary artery disease include dyslipidemia, diabetes mellitus, obesity, male gender and sedentary lifestyle. The current treatment provides moderate improvement. Identifiable causes of hypertension include a thyroid problem.  Diabetes He presents for his follow-up diabetic visit. He has type 2 diabetes mellitus. Associated symptoms include fatigue. Pertinent negatives for diabetes include no blurred vision. Symptoms are stable. Diabetic complications include heart disease, nephropathy and peripheral neuropathy. Risk factors for coronary artery disease include dyslipidemia, diabetes mellitus, male sex, hypertension and sedentary lifestyle. He is following a generally unhealthy diet. (Has not been checking BS at home) An ACE inhibitor/angiotensin II receptor blocker is being taken.  Gastroesophageal Reflux He complains of belching, heartburn and a hoarse voice. This is a chronic problem. The current episode started more than 1 year ago. The problem occurs  occasionally. Associated symptoms include fatigue. He has tried a PPI for the symptoms. The treatment provided moderate relief.  Thyroid Problem Presents for follow-up visit. Symptoms include fatigue and hoarse voice. The symptoms have been stable. His past medical history is significant for hyperlipidemia.  Back Pain This is a chronic problem. The current episode started more than 1 year ago. The problem occurs intermittently. The problem has been waxing and waning since onset. The pain is present in the lumbar spine. The quality of the pain is described as aching. The pain is at a severity of 7/10. The pain is moderate. He has tried analgesics for the symptoms. The treatment provided moderate relief.  Insomnia Primary symptoms: difficulty falling asleep, frequent awakening, malaise/fatigue.   The current episode started more than one year. The onset quality is gradual. The problem occurs intermittently.  Hyperlipidemia This is a chronic problem. The current episode started more than 1 year ago. Exacerbating diseases include obesity. Pertinent negatives include no shortness of breath. Current antihyperlipidemic treatment includes statins. The current treatment provides moderate improvement of lipids. Risk factors for coronary artery disease include dyslipidemia, diabetes mellitus, hypertension, male sex and a sedentary lifestyle.   COPD Continues to  smoke a pack a day. Having SOB with cough.   Review of Systems  Constitutional:  Positive for fatigue and malaise/fatigue.  HENT:  Positive for hoarse voice.   Eyes:  Negative for blurred vision.  Respiratory:  Negative for shortness of breath.   Gastrointestinal:  Positive for heartburn.  Musculoskeletal:  Positive for back pain.  Psychiatric/Behavioral:  The patient has insomnia.   All other systems reviewed and are negative.     Objective:  Physical Exam Vitals reviewed.  Constitutional:      General: He is not in acute distress.     Appearance: He is well-developed. He is obese.  HENT:     Head: Normocephalic.     Right Ear: Tympanic membrane normal.     Left Ear: Tympanic membrane normal.  Eyes:     General:        Right eye: No discharge.        Left eye: No discharge.     Pupils: Pupils are equal, round, and reactive to light.  Neck:     Thyroid: No thyromegaly.  Cardiovascular:     Rate and Rhythm: Normal rate and regular rhythm.     Heart sounds: Normal heart sounds. No murmur heard. Pulmonary:     Effort: Pulmonary effort is normal. No respiratory distress.     Breath sounds: Rhonchi present. No wheezing.  Abdominal:     General: Bowel sounds are normal. There is no distension.     Palpations: Abdomen is soft.     Tenderness: There is no abdominal tenderness.  Musculoskeletal:        General: No tenderness.     Cervical back: Normal range of motion and neck supple.     Comments: Pain in lumbar with flexion and extension   Skin:    General: Skin is warm and dry.     Findings: No erythema or rash.  Neurological:     Mental Status: He is alert and oriented to person, place, and time.     Cranial Nerves: No cranial nerve deficit.     Deep Tendon Reflexes: Reflexes are normal and symmetric.  Psychiatric:        Behavior: Behavior normal.        Thought Content: Thought content normal.        Judgment: Judgment normal.      BP 114/75   Pulse (!) 102   Temp 98.9 F (37.2 C) (Temporal)   Ht 5' 7"  (1.702 m)   Wt 198 lb 9.6 oz (90.1 kg)   SpO2 97%   BMI 31.11 kg/m      Assessment & Plan:  Charles Pennington comes in today with chief complaint of Medical Management of Chronic Issues   Diagnosis and orders addressed:  1. Type 2 diabetes mellitus treated with insulin ([facility]) - Lipid panel  2. Hypertension associated with diabetes ([place]) - CMP14+EGFR - Lipid panel  3. Diabetes mellitus with complication ([place]) - Bayer DCA Hb A1c Waived - CBC with Differential/Platelet  4. Hypothyroidism,  unspecified type - TSH  5. Insomnia, unspecified type - zolpidem (AMBIEN) 10 MG tablet; Take 1 tablet (10 mg total) by mouth at bedtime.  Dispense: 30 tablet; Refill: 5  6. COPD with acute exacerbation ([place])  7. Hyperlipidemia associated with type 2 diabetes mellitus ([place])  8. Noncompliance with medications  9. Substance abuse ([place])  10. SMOKER    Labs pending Health Maintenance reviewed Diet and exercise encouraged  Follow up plan: 1 month   Evelina Dun, FNP

## 2020-08-26 NOTE — Patient Instructions (Signed)
Diabetes Mellitus and Nutrition, Adult When you have diabetes, or diabetes mellitus, it is very important to have healthy eating habits because your blood sugar (glucose) levels are greatly affected by what you eat and drink. Eating healthy foods in the right amounts, at about the same times every day, can help you:  Control your blood glucose.  Lower your risk of heart disease.  Improve your blood pressure.  Reach or maintain a healthy weight. What can affect my meal plan? Every person with diabetes is different, and each person has different needs for a meal plan. Your health care provider may recommend that you work with a dietitian to make a meal plan that is best for you. Your meal plan may vary depending on factors such as:  The calories you need.  The medicines you take.  Your weight.  Your blood glucose, blood pressure, and cholesterol levels.  Your activity level.  Other health conditions you have, such as heart or kidney disease. How do carbohydrates affect me? Carbohydrates, also called carbs, affect your blood glucose level more than any other type of food. Eating carbs naturally raises the amount of glucose in your blood. Carb counting is a method for keeping track of how many carbs you eat. Counting carbs is important to keep your blood glucose at a healthy level, especially if you use insulin or take certain oral diabetes medicines. It is important to know how many carbs you can safely have in each meal. This is different for every person. Your dietitian can help you calculate how many carbs you should have at each meal and for each snack. How does alcohol affect me? Alcohol can cause a sudden decrease in blood glucose (hypoglycemia), especially if you use insulin or take certain oral diabetes medicines. Hypoglycemia can be a life-threatening condition. Symptoms of hypoglycemia, such as sleepiness, dizziness, and confusion, are similar to symptoms of having too much  alcohol.  Do not drink alcohol if: ? Your health care provider tells you not to drink. ? You are pregnant, may be pregnant, or are planning to become pregnant.  If you drink alcohol: ? Do not drink on an empty stomach. ? Limit how much you use to:  0-1 drink a day for women.  0-2 drinks a day for men. ? Be aware of how much alcohol is in your drink. In the [place] one drink equals one 12 oz bottle of beer (355 mL), one 5 oz glass of wine (148 mL), or one 1 oz glass of hard liquor (44 mL). ? Keep yourself hydrated with water, diet soda, or unsweetened iced tea.  Keep in mind that regular soda, juice, and other mixers may contain a lot of sugar and must be counted as carbs. What are tips for following this plan? Reading food labels  Start by checking the serving size on the "Nutrition Facts" label of packaged foods and drinks. The amount of calories, carbs, fats, and other nutrients listed on the label is based on one serving of the item. Many items contain more than one serving per package.  Check the total grams (g) of carbs in one serving. You can calculate the number of servings of carbs in one serving by dividing the total carbs by 15. For example, if a food has 30 g of total carbs per serving, it would be equal to 2 servings of carbs.  Check the number of grams (g) of saturated fats and trans fats in one serving. Choose foods that have   a low amount or none of these fats.  Check the number of milligrams (mg) of salt (sodium) in one serving. Most people should limit total sodium intake to less than 2,300 mg per day.  Always check the nutrition information of foods labeled as "low-fat" or "nonfat." These foods may be higher in added sugar or refined carbs and should be avoided.  Talk to your dietitian to identify your daily goals for nutrients listed on the label. Shopping  Avoid buying canned, pre-made, or processed foods. These foods tend to be high in fat, sodium, and added  sugar.  Shop around the outside edge of the grocery store. This is where you will most often find fresh fruits and vegetables, bulk grains, fresh meats, and fresh dairy. Cooking  Use low-heat cooking methods, such as baking, instead of high-heat cooking methods like deep frying.  Cook using healthy oils, such as olive, canola, or sunflower oil.  Avoid cooking with butter, cream, or high-fat meats. Meal planning  Eat meals and snacks regularly, preferably at the same times every day. Avoid going long periods of time without eating.  Eat foods that are high in fiber, such as fresh fruits, vegetables, beans, and whole grains. Talk with your dietitian about how many servings of carbs you can eat at each meal.  Eat 4-6 oz (112-168 g) of lean protein each day, such as lean meat, chicken, fish, eggs, or tofu. One ounce (oz) of lean protein is equal to: ? 1 oz (28 g) of meat, chicken, or fish. ? 1 egg. ?  cup (62 g) of tofu.  Eat some foods each day that contain healthy fats, such as avocado, nuts, seeds, and fish.   What foods should I eat? Fruits Berries. Apples. Oranges. Peaches. Apricots. Plums. Grapes. Mango. Papaya. Pomegranate. Kiwi. Cherries. Vegetables Lettuce. Spinach. Leafy greens, including kale, chard, collard greens, and mustard greens. Beets. Cauliflower. Cabbage. Broccoli. Carrots. Green beans. Tomatoes. Peppers. Onions. Cucumbers. Brussels sprouts. Grains Whole grains, such as whole-wheat or whole-grain bread, crackers, tortillas, cereal, and pasta. Unsweetened oatmeal. Quinoa. Brown or wild rice. Meats and other proteins Seafood. Poultry without skin. Lean cuts of poultry and beef. Tofu. Nuts. Seeds. Dairy Low-fat or fat-free dairy products such as milk, yogurt, and cheese. The items listed above may not be a complete list of foods and beverages you can eat. Contact a dietitian for more information. What foods should I avoid? Fruits Fruits canned with  syrup. Vegetables Canned vegetables. Frozen vegetables with butter or cream sauce. Grains Refined white flour and flour products such as bread, pasta, snack foods, and cereals. Avoid all processed foods. Meats and other proteins Fatty cuts of meat. Poultry with skin. Breaded or fried meats. Processed meat. Avoid saturated fats. Dairy Full-fat yogurt, cheese, or milk. Beverages Sweetened drinks, such as soda or iced tea. The items listed above may not be a complete list of foods and beverages you should avoid. Contact a dietitian for more information. Questions to ask a health care provider  Do I need to meet with a diabetes educator?  Do I need to meet with a dietitian?  What number can I call if I have questions?  When are the best times to check my blood glucose? Where to find more information:  American Diabetes Association: diabetes.org  Academy of Nutrition and Dietetics: www.eatright.org  National Institute of Diabetes and Digestive and Kidney Diseases: www.niddk.nih.gov  Association of Diabetes Care and Education Specialists: www.diabeteseducator.org Summary  It is important to have healthy eating   habits because your blood sugar (glucose) levels are greatly affected by what you eat and drink.  A healthy meal plan will help you control your blood glucose and maintain a healthy lifestyle.  Your health care provider may recommend that you work with a dietitian to make a meal plan that is best for you.  Keep in mind that carbohydrates (carbs) and alcohol have immediate effects on your blood glucose levels. It is important to count carbs and to use alcohol carefully. This information is not intended to replace advice given to you by your health care provider. Make sure you discuss any questions you have with your health care provider. Document Revised: 01/09/2019 Document Reviewed: 01/09/2019 Elsevier Patient Education  2021 Elsevier Inc.  

## 2020-08-27 LAB — CMP14+EGFR
ALT: 23 IU/L (ref 0–44)
AST: 19 IU/L (ref 0–40)
Albumin/Globulin Ratio: 1.7 (ref 1.2–2.2)
Albumin: 4 g/dL (ref 3.8–4.9)
Alkaline Phosphatase: 101 IU/L (ref 44–121)
BUN/Creatinine Ratio: 19 (ref 9–20)
BUN: 14 mg/dL (ref 6–24)
Bilirubin Total: 0.3 mg/dL (ref 0.0–1.2)
CO2: 25 mmol/L (ref 20–29)
Calcium: 9.3 mg/dL (ref 8.7–10.2)
Chloride: 99 mmol/L (ref 96–106)
Creatinine, Ser: 0.73 mg/dL — ABNORMAL LOW (ref 0.76–1.27)
Globulin, Total: 2.3 g/dL (ref 1.5–4.5)
Glucose: 201 mg/dL — ABNORMAL HIGH (ref 65–99)
Potassium: 4.5 mmol/L (ref 3.5–5.2)
Sodium: 137 mmol/L (ref 134–144)
Total Protein: 6.3 g/dL (ref 6.0–8.5)
eGFR: 108 mL/min/{1.73_m2} (ref >59)

## 2020-08-27 LAB — LIPID PANEL
Chol/HDL Ratio: 2.3 ratio (ref 0.0–5.0)
Cholesterol, Total: 115 mg/dL (ref 100–199)
HDL: 49 mg/dL (ref >39)
LDL Chol Calc (NIH): 50 mg/dL (ref 0–99)
Triglycerides: 78 mg/dL (ref 0–149)
VLDL Cholesterol Cal: 16 mg/dL (ref 5–40)

## 2020-08-27 LAB — CBC WITH DIFFERENTIAL/PLATELET
Basophils Absolute: 0.1 10*3/uL (ref 0.0–0.2)
Basos: 1 %
EOS (ABSOLUTE): 0.3 10*3/uL (ref 0.0–0.4)
Eos: 3 %
Hematocrit: 46.7 % (ref 37.5–51.0)
Hemoglobin: 16 g/dL (ref 13.0–17.7)
Immature Grans (Abs): 0 10*3/uL (ref 0.0–0.1)
Immature Granulocytes: 0 %
Lymphocytes Absolute: 2.8 10*3/uL (ref 0.7–3.1)
Lymphs: 35 %
MCH: 32.5 pg (ref 26.6–33.0)
MCHC: 34.3 g/dL (ref 31.5–35.7)
MCV: 95 fL (ref 79–97)
Monocytes Absolute: 0.5 10*3/uL (ref 0.1–0.9)
Monocytes: 6 %
Neutrophils Absolute: 4.4 10*3/uL (ref 1.4–7.0)
Neutrophils: 55 %
Platelets: 237 10*3/uL (ref 150–450)
RBC: 4.92 x10E6/uL (ref 4.14–5.80)
RDW: 13 % (ref 11.6–15.4)
WBC: 8.1 10*3/uL (ref 3.4–10.8)

## 2020-08-27 LAB — TSH: TSH: 3.49 u[IU]/mL (ref 0.450–4.500)

## 2020-09-01 DIAGNOSIS — R030 Elevated blood-pressure reading, without diagnosis of hypertension: Secondary | ICD-10-CM | POA: Diagnosis not present

## 2020-09-01 DIAGNOSIS — M5136 Other intervertebral disc degeneration, lumbar region: Secondary | ICD-10-CM | POA: Diagnosis not present

## 2020-09-01 DIAGNOSIS — F17200 Nicotine dependence, unspecified, uncomplicated: Secondary | ICD-10-CM | POA: Diagnosis not present

## 2020-09-01 DIAGNOSIS — G894 Chronic pain syndrome: Secondary | ICD-10-CM | POA: Diagnosis not present

## 2020-09-01 DIAGNOSIS — E119 Type 2 diabetes mellitus without complications: Secondary | ICD-10-CM | POA: Diagnosis not present

## 2020-09-01 DIAGNOSIS — Z6830 Body mass index (BMI) 30.0-30.9, adult: Secondary | ICD-10-CM | POA: Diagnosis not present

## 2020-09-01 DIAGNOSIS — Z79899 Other long term (current) drug therapy: Secondary | ICD-10-CM | POA: Diagnosis not present

## 2020-09-01 DIAGNOSIS — E669 Obesity, unspecified: Secondary | ICD-10-CM | POA: Diagnosis not present

## 2020-09-01 DIAGNOSIS — F17210 Nicotine dependence, cigarettes, uncomplicated: Secondary | ICD-10-CM | POA: Diagnosis not present

## 2020-09-18 ENCOUNTER — Ambulatory Visit: Payer: Medicare PPO | Admitting: Pharmacist

## 2020-09-26 ENCOUNTER — Ambulatory Visit: Payer: Medicare PPO | Admitting: Family

## 2020-10-06 ENCOUNTER — Encounter: Payer: Self-pay | Admitting: Family

## 2020-10-09 ENCOUNTER — Other Ambulatory Visit: Payer: Self-pay | Admitting: Family

## 2020-10-09 DIAGNOSIS — F411 Generalized anxiety disorder: Secondary | ICD-10-CM

## 2020-10-09 DIAGNOSIS — F331 Major depressive disorder, recurrent, moderate: Secondary | ICD-10-CM

## 2020-10-17 ENCOUNTER — Other Ambulatory Visit: Payer: Self-pay | Admitting: Family

## 2020-10-17 DIAGNOSIS — R062 Wheezing: Secondary | ICD-10-CM

## 2020-10-17 DIAGNOSIS — J209 Acute bronchitis, unspecified: Secondary | ICD-10-CM

## 2020-11-04 ENCOUNTER — Other Ambulatory Visit: Payer: Self-pay

## 2020-11-04 ENCOUNTER — Encounter: Payer: Self-pay | Admitting: Nurse Practitioner

## 2020-11-04 ENCOUNTER — Ambulatory Visit (INDEPENDENT_AMBULATORY_CARE_PROVIDER_SITE_OTHER): Payer: Medicare PPO | Admitting: Nurse Practitioner

## 2020-11-04 VITALS — BP 101/68 | HR 88 | Temp 97.8°F | Ht 68.0 in | Wt 197.0 lb

## 2020-11-04 DIAGNOSIS — L03113 Cellulitis of right upper limb: Secondary | ICD-10-CM | POA: Insufficient documentation

## 2020-11-04 MED ORDER — CEPHALEXIN 500 MG PO CAPS: 500.0000 mg | ORAL_CAPSULE | Freq: Two times a day (BID) | ORAL | 0 refills | Status: DC

## 2020-11-04 MED ORDER — CEFTRIAXONE SODIUM 1 G IJ SOLR
1.0000 g | Freq: Once | INTRAMUSCULAR | Status: AC
  Administered 2020-11-04: 1 g via INTRAMUSCULAR

## 2020-11-04 NOTE — Addendum Note (Signed)
Addended byDory Peru on: 11/04/2020 03:54 PM   Modules accepted: Orders

## 2020-11-04 NOTE — Progress Notes (Signed)
Acute Office Visit  Subjective:    Patient ID: Charles Pennington, male    DOB: 07-31-1965, 55 y.o.   MRN: [id number]  Chief Complaint  Patient presents with   Insect Bite    HPI Patient is in today for cellulitis of the right upper arm.  Symptoms started 7 days ago with pain, redness and warmth.  Patient cannot remember if he was bitten by an insect or exactly what happened.  No fever, chills, headache, visual changes associated with worsening symptoms.   Past Medical History:  Diagnosis Date   Anxiety    Asthma    Diabetes mellitus    Diabetes mellitus without complication ([place])    Hypercholesteremia    Hypertension    Hypothyroidism    Low back pain    Obesity    SOB (shortness of breath)    Stroke [facility]) age 33   Stroke [facility])    Vertigo     Past Surgical History:  Procedure Laterality Date   BACK SURGERY  2013   COLONOSCOPY  2009   Inflammatory changes of the cecum and ascending colon most consistent with infectious etiology, NSAID, ischemia. Suspected resolving infection based on symptomatology.   FOOT SURGERY     Dr Irving Shows   SHOULDER ARTHROSCOPY Right    SPINE SURGERY     WRIST SURGERY      Family History  Problem Relation Age of Onset   Hypertension Father    Suicidality Father    Diabetes Father    Diabetes Mother    Colon cancer Neg Hx    Inflammatory bowel disease Neg Hx     Social History   Socioeconomic History   Marital status: Single    Spouse name: Not on file   Number of children: 2   Years of education: Not on file   Highest education level: 8th grade  Occupational History   Occupation: disabled Clinical biochemist  Tobacco Use   Smoking status: Every Day    Packs/day: 1.00    Years: 30.00    Pack years: 30.00    Types: Cigarettes   Smokeless tobacco: Never  Vaping Use   Vaping Use: Never used  Substance and Sexual Activity   Alcohol use: Never   Drug use: No   Sexual activity: Yes  Other Topics Concern   Not on file  Social  History Narrative   ** Merged History Encounter **    His mom lives with him. Children live nearby   Social Determinants of Health   Financial Resource Strain: High Risk   Difficulty of Paying Living Expenses: Hard  Food Insecurity: Food Insecurity Present   Worried About Charity fundraiser in the Last Year: Often true   Arboriculturist in the Last Year: Sometimes true  Transportation Needs: Not on file  Physical Activity: Inactive   Days of Exercise per Week: 0 days   Minutes of Exercise per Session: 0 min  Stress: No Stress Concern Present   Feeling of Stress : Only a little  Social Connections: Socially Isolated   Frequency of Communication with Friends and Family: More than three times a week   Frequency of Social Gatherings with Friends and Family: More than three times a week   Attends Religious Services: Never   Marine scientist or Organizations: No   Attends Archivist Meetings: Never   Marital Status: Never married  Human resources officer Violence: Not At Risk   Fear of Current  or Ex-Partner: No   Emotionally Abused: No   Physically Abused: No   Sexually Abused: No    Outpatient Medications Prior to Visit  Medication Sig Dispense Refill   albuterol (PROVENTIL) (2.5 MG/3ML) 0.083% nebulizer solution NEBULIZE 1 VIAL EVERY 6 HOURS AS NEEDED FOR WHEEZING OR SHORTNESS OF BREATH 180 mL 0   albuterol (VENTOLIN HFA) 108 (90 Base) MCG/ACT inhaler INHALE 2 PUFFS INTO THE LUNGS EVERY 6 (SIX) HOURS AS NEEDED FOR WHEEZING. 8.5 g 0   atorvastatin (LIPITOR) 80 MG tablet TAKE 1 TABLET DAILY 30 tablet 4   baclofen (LIORESAL) 10 MG tablet Take 10 mg by mouth 2 (two) times daily as needed.     BELBUCA 450 MCG FILM Take 1 Film by mouth in the morning and at bedtime.     blood glucose meter kit and supplies Dispense based on patient and insurance preference. Test sugar BID and as needed 1 each 0   CONTOUR NEXT TEST test strip Check blood sugar twice daily Dx E11.9 200 strip 3    dapagliflozin propanediol (FARXIGA) 10 MG TABS tablet Take 1 tablet (10 mg total) by mouth daily. 90 tablet 3   diclofenac (VOLTAREN) 75 MG EC tablet Take 1 tablet (75 mg total) by mouth 2 (two) times daily. 30 tablet 0   Dulaglutide (TRULICITY) 3 MC/9.4BS SOPN Inject 1 mg as directed once a week. Discontinue 1.57m sq week dose     hydroxypropyl methylcellulose / hypromellose (ISOPTO TEARS / GONIOVISC) 2.5 % ophthalmic solution Place 1 drop into both eyes as needed for dry eyes.     insulin degludec (TRESIBA FLEXTOUCH) 200 UNIT/ML FlexTouch Pen Inject 40 Units into the skin at bedtime. 18 mL 0   Insulin Pen Needle (ULTICARE MICRO PEN NEEDLES) 32G X 4 MM MISC Use twice daily Dx E11.40 200 each 3   levothyroxine (SYNTHROID) 175 MCG tablet TAKE (1) TABLET DAILY BEFORE BREAKFAST. 90 tablet 3   lisinopril (ZESTRIL) 20 MG tablet Take 1 tablet (20 mg total) by mouth daily. 90 tablet 2   metFORMIN (GLUCOPHAGE-XR) 750 MG 24 hr tablet TAKE 2 TABLETS DAILY WITH MORNING MEAL 180 tablet 0   omeprazole (PRILOSEC) 20 MG capsule TAKE (1) CAPSULE DAILY 90 capsule 0   PARoxetine (PAXIL) 40 MG tablet TAKE 1 TABLET DAILY 90 tablet 0   pregabalin (LYRICA) 150 MG capsule TAKE 2 CAPSULES EVERY MORNING AND 2 CAPSULES EVERY EVENING 120 capsule 2   TRELEGY ELLIPTA 100-62.5-25 MCG/INH AEPB INHALE 1 ONCE A DAY AS DIRECTED 60 each 2   zolpidem (AMBIEN) 10 MG tablet Take 1 tablet (10 mg total) by mouth at bedtime. 30 tablet 5   No facility-administered medications prior to visit.    Allergies  Allergen Reactions   Hydrocodone-Acetaminophen Itching and Other (See Comments)    Pt is able to tolerate with Benadryl.    Gadolinium Derivatives Nausea And Vomiting    Review of Systems  Constitutional: Negative.   HENT: Negative.    Eyes: Negative.   Cardiovascular: Negative.   Gastrointestinal: Negative.   Musculoskeletal:  Positive for joint swelling.  Skin:  Positive for color change.  All other systems reviewed and  are negative.     Objective:    Physical Exam Vitals and nursing note reviewed.  Constitutional:      Appearance: Normal appearance.  HENT:     Head: Normocephalic.     Nose: Nose normal.     Mouth/Throat:     Mouth: Mucous membranes are moist.  Pharynx: Oropharynx is clear.  Eyes:     Conjunctiva/sclera: Conjunctivae normal.  Cardiovascular:     Rate and Rhythm: Normal rate and regular rhythm.     Pulses: Normal pulses.     Heart sounds: Normal heart sounds.  Pulmonary:     Effort: Pulmonary effort is normal.     Breath sounds: Normal breath sounds.  Abdominal:     General: Bowel sounds are normal.  Musculoskeletal:     Right upper arm: Swelling, edema and tenderness present.  Skin:    General: Skin is warm.     Findings: Erythema present.  Neurological:     Mental Status: He is alert and oriented to person, place, and time.    BP 101/68   Pulse 88   Temp 97.8 F (36.6 C) (Temporal)   Ht _0  (1.727 m)   Wt 197 lb (89.4 kg)   BMI 29.95 kg/m  Wt Readings from Last 3 Encounters:  11/04/20 197 lb (89.4 kg)  08/26/20 198 lb 9.6 oz (90.1 kg)  08/13/20 207 lb (93.9 kg)    Health Maintenance Due  Topic Date Due   Zoster Vaccines- Shingrix (1 of 2) Never done   OPHTHALMOLOGY EXAM  05/02/2019   COVID-19 Vaccine (3 - Moderna risk series) 10/10/2019   FOOT EXAM  06/28/2020   INFLUENZA VACCINE  09/15/2020    There are no preventive care reminders to display for this patient.   Lab Results  Component Value Date   TSH 3.490 08/26/2020   Lab Results  Component Value Date   WBC 8.1 08/26/2020   HGB 16.0 08/26/2020   HCT 46.7 08/26/2020   MCV 95 08/26/2020   PLT 237 08/26/2020   Lab Results  Component Value Date   NA 137 08/26/2020   K 4.5 08/26/2020   CO2 25 08/26/2020   GLUCOSE 201 (H) 08/26/2020   BUN 14 08/26/2020   CREATININE 0.73 (L) 08/26/2020   BILITOT 0.3 08/26/2020   ALKPHOS 101 08/26/2020   AST 19 08/26/2020   ALT 23 08/26/2020    PROT 6.3 08/26/2020   ALBUMIN 4.0 08/26/2020   CALCIUM 9.3 08/26/2020   ANIONGAP 8 01/23/2020   EGFR 108 08/26/2020   Lab Results  Component Value Date   CHOL 115 08/26/2020   Lab Results  Component Value Date   HDL 49 08/26/2020   Lab Results  Component Value Date   LDLCALC 50 08/26/2020   Lab Results  Component Value Date   TRIG 78 08/26/2020   Lab Results  Component Value Date   CHOLHDL 2.3 08/26/2020   Lab Results  Component Value Date   HGBA1C 8.9 (H) 08/26/2020       Assessment & Plan:   Problem List Items Addressed This Visit       Other   Cellulitis of right hand - Primary    Symptoms not well controlled.  1 g Rocephin given in clinic, Keflex 500 mg by mouth twice daily.  Follow-up in 3 days.  Education provided to patient printed handouts given.  Rx sent to pharmacy.      Relevant Medications   cephALEXin (KEFLEX) 500 MG capsule     Meds ordered this encounter  Medications   cephALEXin (KEFLEX) 500 MG capsule    Sig: Take 1 capsule (500 mg total) by mouth 2 (two) times daily.    Dispense:  14 capsule    Refill:  0    Order Specific Question:   Supervising Provider  AnswerJanora Norlander [[id number]]     Ivy Lynn, NP

## 2020-11-04 NOTE — Assessment & Plan Note (Signed)
Symptoms not well controlled.  1 g Rocephin given in clinic, Keflex 500 mg by mouth twice daily.  Follow-up in 3 days.  Education provided to patient printed handouts given.  Rx sent to pharmacy.

## 2020-11-04 NOTE — Patient Instructions (Signed)
Cellulitis, Adult Cellulitis is a skin infection. The infected area is often warm, red, swollen, and sore. It occurs most often in the arms and lower legs. It is very important to get treated for this condition. What are the causes? This condition is caused by bacteria. The bacteria enter through a break in the skin, such as a cut, burn, insect bite, open sore, or crack. What increases the risk? This condition is more likely to occur in people who: Have a weak body defense system (immune system). Have open cuts, burns, bites, or scrapes on the skin. Are older than 54 years of age. Have a blood sugar problem (diabetes). Have a long-lasting (chronic) liver disease (cirrhosis) or kidney disease. Are very overweight (obese). Have a skin problem, such as: Itchy rash (eczema). Slow movement of blood in the veins (venous stasis). Fluid buildup below the skin (edema). Have been treated with high-energy rays (radiation). Use IV drugs. What are the signs or symptoms? Symptoms of this condition include: Skin that is: Red. Streaking. Spotting. Swollen. Sore or painful when you touch it. Warm. A fever. Chills. Blisters. How is this diagnosed? This condition is diagnosed based on: Medical history. Physical exam. Blood tests. Imaging tests. How is this treated? Treatment for this condition may include: Medicines to treat infections or allergies. Home care, such as: Rest. Placing cold or warm cloths (compresses) on the skin. Hospital care, if the condition is very bad. Follow these instructions at home: Medicines Take over-the-counter and prescription medicines only as told by your doctor. If you were prescribed an antibiotic medicine, take it as told by your doctor. Do not stop taking it even if you start to feel better. General instructions  Drink enough fluid to keep your pee (urine) pale yellow. Do not touch or rub the infected area. Raise (elevate) the infected area above the  level of your heart while you are sitting or lying down. Place cold or warm cloths on the area as told by your doctor. Keep all follow-up visits as told by your doctor. This is important. Contact a doctor if: You have a fever. You do not start to get better after 1-2 days of treatment. Your bone or joint under the infected area starts to hurt after the skin has healed. Your infection comes back. This can happen in the same area or another area. You have a swollen bump in the area. You have new symptoms. You feel ill and have muscle aches and pains. Get help right away if: Your symptoms get worse. You feel very sleepy. You throw up (vomit) or have watery poop (diarrhea) for a long time. You see red streaks coming from the area. Your red area gets larger. Your red area turns dark in color. These symptoms may represent a serious problem that is an emergency. Do not wait to see if the symptoms will go away. Get medical help right away. Call your local emergency services (911 in the [place] Do not drive yourself to the hospital. Summary Cellulitis is a skin infection. The area is often warm, red, swollen, and sore. This condition is treated with medicines, rest, and cold and warm cloths. Take all medicines only as told by your doctor. Tell your doctor if symptoms do not start to get better after 1-2 days of treatment. This information is not intended to replace advice given to you by your health care provider. Make sure you discuss any questions you have with your health care provider. Document Revised: 06/23/2017 Document Reviewed:   06/23/2017 Elsevier Patient Education  2022 Elsevier Inc.  

## 2020-11-05 ENCOUNTER — Telehealth: Payer: Self-pay | Admitting: Family

## 2020-11-06 ENCOUNTER — Telehealth: Payer: Self-pay | Admitting: Family

## 2020-11-06 ENCOUNTER — Ambulatory Visit (INDEPENDENT_AMBULATORY_CARE_PROVIDER_SITE_OTHER): Payer: Medicare PPO | Admitting: Family

## 2020-11-06 ENCOUNTER — Encounter: Payer: Self-pay | Admitting: Family

## 2020-11-06 DIAGNOSIS — L03113 Cellulitis of right upper limb: Secondary | ICD-10-CM | POA: Diagnosis not present

## 2020-11-06 DIAGNOSIS — L03119 Cellulitis of unspecified part of limb: Secondary | ICD-10-CM

## 2020-11-06 MED ORDER — CEFTRIAXONE SODIUM 1 G IJ SOLR
1.0000 g | Freq: Once | INTRAMUSCULAR | Status: AC
  Administered 2020-11-07: 1 g via INTRAMUSCULAR

## 2020-11-06 MED ORDER — DOXYCYCLINE HYCLATE 100 MG PO TABS: 100.0000 mg | ORAL_TABLET | Freq: Two times a day (BID) | ORAL | 0 refills | Status: DC

## 2020-11-06 NOTE — Telephone Encounter (Signed)
Patient seen JE yesterday  Treating provider is OFF  Will send to PCP to advise

## 2020-11-06 NOTE — Telephone Encounter (Signed)
Patient aware and verbalized understanding. Appt made 

## 2020-11-06 NOTE — Telephone Encounter (Signed)
NA

## 2020-11-06 NOTE — Telephone Encounter (Signed)
Pt called stating that he was seen yesterday for a spider bite and was given a shot but says today he is in so much pain and really needs an antibiotic or something called in for him today.  Pt scheduled to come in tomorrow but says he cant wait until then.  Please advise and call patient.

## 2020-11-06 NOTE — Telephone Encounter (Signed)
Attempted to call patient multiple times to do telephone visit. If patient calls back please update his current phone number.   [name], FNP

## 2020-11-06 NOTE — Progress Notes (Signed)
   Virtual Visit  Note Due to COVID-19 pandemic this visit was conducted virtually. This visit type was conducted due to national recommendations for restrictions regarding the COVID-19 Pandemic (e.g. social distancing, sheltering in place) in an effort to limit this patient's exposure and mitigate transmission in our community. All issues noted in this document were discussed and addressed.  A physical exam was not performed with this format.  I connected with Charles Pennington on 11/06/20 at 4:51 pm  by telephone and verified that I am speaking with the correct person using two identifiers. Charles Pennington is [place] wash and no one is currently with him during visit. The provider, [name], FNP is located in their office at time of visit.  I discussed the limitations, risks, security and privacy concerns of performing an evaluation and management service by telephone and the availability of in person appointments. I also discussed with the patient that there may be a patient responsible charge related to this service. The patient expressed understanding and agreed to proceed.   History and Present Illness:  HPI Pt calls the office elbow pain and redness that had an insect bite about a week ago. He was seen in the office on 11/04/20 and given Rocephin 1 g and Keflex 500 mg BID. He states he did not get the Keflex.   He states her arm is swollen, red, tender, and having heat.   Review of Systems  Skin:        Wound   All other systems reviewed and are negative.   Observations/Objective: No SOB or distress noted   Assessment and Plan: 1. Cellulitis of elbow Start doxycycline today Need to have good control of glucose Keep follow up tomorrow  - cefTRIAXone (ROCEPHIN) injection 1 g - doxycycline (VIBRA-TABS) 100 MG tablet; Take 1 tablet (100 mg total) by mouth 2 (two) times daily.  Dispense: 20 tablet; Refill: 0   I discussed the assessment and treatment plan  with the patient. The patient was provided an opportunity to ask questions and all were answered. The patient agreed with the plan and demonstrated an understanding of the instructions.   The patient was advised to call back or seek an in-person evaluation if the symptoms worsen or if the condition fails to improve as anticipated.  The above assessment and management plan was discussed with the patient. The patient verbalized understanding of and has agreed to the management plan. Patient is aware to call the clinic if symptoms persist or worsen. Patient is aware when to return to the clinic for a follow-up visit. Patient educated on when it is appropriate to go to the emergency department.   Time call ended:  5:06 pm   I provided 15 minutes of  non face-to-face time during this encounter.    [name], FNP

## 2020-11-07 ENCOUNTER — Ambulatory Visit: Payer: Medicare PPO | Admitting: Nurse Practitioner

## 2020-11-07 ENCOUNTER — Ambulatory Visit (INDEPENDENT_AMBULATORY_CARE_PROVIDER_SITE_OTHER): Payer: Medicare PPO | Admitting: Nurse Practitioner

## 2020-11-07 ENCOUNTER — Other Ambulatory Visit: Payer: Self-pay

## 2020-11-07 ENCOUNTER — Encounter: Payer: Self-pay | Admitting: Nurse Practitioner

## 2020-11-07 VITALS — BP 138/88 | HR 100 | Temp 97.4°F | Ht 68.0 in | Wt 197.0 lb

## 2020-11-07 DIAGNOSIS — Z794 Long term (current) use of insulin: Secondary | ICD-10-CM | POA: Diagnosis not present

## 2020-11-07 DIAGNOSIS — L03113 Cellulitis of right upper limb: Secondary | ICD-10-CM | POA: Diagnosis not present

## 2020-11-07 DIAGNOSIS — E1159 Type 2 diabetes mellitus with other circulatory complications: Secondary | ICD-10-CM

## 2020-11-07 DIAGNOSIS — I152 Hypertension secondary to endocrine disorders: Secondary | ICD-10-CM

## 2020-11-07 DIAGNOSIS — E119 Type 2 diabetes mellitus without complications: Secondary | ICD-10-CM | POA: Diagnosis not present

## 2020-11-07 LAB — BMP8+EGFR
BUN/Creatinine Ratio: 16 (ref 9–20)
BUN: 13 mg/dL (ref 6–24)
CO2: 26 mmol/L (ref 20–29)
Calcium: 9.7 mg/dL (ref 8.7–10.2)
Chloride: 94 mmol/L — ABNORMAL LOW (ref 96–106)
Creatinine, Ser: 0.81 mg/dL (ref 0.76–1.27)
Glucose: 355 mg/dL — ABNORMAL HIGH (ref 65–99)
Potassium: 4 mmol/L (ref 3.5–5.2)
Sodium: 135 mmol/L (ref 134–144)
eGFR: 105 mL/min/{1.73_m2} (ref >59)

## 2020-11-07 LAB — BAYER DCA HB A1C WAIVED: HB A1C (BAYER DCA - WAIVED): 9.4 % — ABNORMAL HIGH (ref 4.8–5.6)

## 2020-11-07 NOTE — Progress Notes (Signed)
Acute Office Visit  Subjective:    Patient ID: Charles Pennington, male    DOB: 05-05-1965, 55 y.o.   MRN: [id number]  Chief Complaint  Patient presents with   Cellulitis    HPI Patient is in today for cellulitis follow-up.  Patient's right arm is a lot better, no redness, swelling is still the same and hard to touch.  Patient did not start antibiotics because he was not picked up from the pharmacy.  Patient reports that he will go to pharmacy after this visit to pick up all medication.  No worsening pain or redness, no fever, nausea or vomiting.   Past Medical History:  Diagnosis Date   Anxiety    Asthma    Diabetes mellitus    Diabetes mellitus without complication ([place])    Hypercholesteremia    Hypertension    Hypothyroidism    Low back pain    Obesity    SOB (shortness of breath)    Stroke [facility]) age 89   Stroke [facility])    Vertigo     Past Surgical History:  Procedure Laterality Date   BACK SURGERY  2013   COLONOSCOPY  2009   Inflammatory changes of the cecum and ascending colon most consistent with infectious etiology, NSAID, ischemia. Suspected resolving infection based on symptomatology.   FOOT SURGERY     Dr Irving Shows   SHOULDER ARTHROSCOPY Right    SPINE SURGERY     WRIST SURGERY      Family History  Problem Relation Age of Onset   Hypertension Father    Suicidality Father    Diabetes Father    Diabetes Mother    Colon cancer Neg Hx    Inflammatory bowel disease Neg Hx     Social History   Socioeconomic History   Marital status: Single    Spouse name: Not on file   Number of children: 2   Years of education: Not on file   Highest education level: 8th grade  Occupational History   Occupation: disabled Clinical biochemist  Tobacco Use   Smoking status: Every Day    Packs/day: 1.00    Years: 30.00    Pack years: 30.00    Types: Cigarettes   Smokeless tobacco: Never  Vaping Use   Vaping Use: Never used  Substance and Sexual Activity   Alcohol use:  Never   Drug use: No   Sexual activity: Yes  Other Topics Concern   Not on file  Social History Narrative   ** Merged History Encounter **    His mom lives with him. Children live nearby   Social Determinants of Health   Financial Resource Strain: High Risk   Difficulty of Paying Living Expenses: Hard  Food Insecurity: Food Insecurity Present   Worried About Charity fundraiser in the Last Year: Often true   Arboriculturist in the Last Year: Sometimes true  Transportation Needs: Not on file  Physical Activity: Inactive   Days of Exercise per Week: 0 days   Minutes of Exercise per Session: 0 min  Stress: No Stress Concern Present   Feeling of Stress : Only a little  Social Connections: Socially Isolated   Frequency of Communication with Friends and Family: More than three times a week   Frequency of Social Gatherings with Friends and Family: More than three times a week   Attends Religious Services: Never   Marine scientist or Organizations: No   Attends Archivist  Meetings: Never   Marital Status: Never married  Human resources officer Violence: Not At Risk   Fear of Current or Ex-Partner: No   Emotionally Abused: No   Physically Abused: No   Sexually Abused: No    Outpatient Medications Prior to Visit  Medication Sig Dispense Refill   albuterol (PROVENTIL) (2.5 MG/3ML) 0.083% nebulizer solution NEBULIZE 1 VIAL EVERY 6 HOURS AS NEEDED FOR WHEEZING OR SHORTNESS OF BREATH 180 mL 0   albuterol (VENTOLIN HFA) 108 (90 Base) MCG/ACT inhaler INHALE 2 PUFFS INTO THE LUNGS EVERY 6 (SIX) HOURS AS NEEDED FOR WHEEZING. 8.5 g 0   atorvastatin (LIPITOR) 80 MG tablet TAKE 1 TABLET DAILY 30 tablet 4   baclofen (LIORESAL) 10 MG tablet Take 10 mg by mouth 2 (two) times daily as needed.     BELBUCA 450 MCG FILM Take 1 Film by mouth in the morning and at bedtime.     blood glucose meter kit and supplies Dispense based on patient and insurance preference. Test sugar BID and as needed 1  each 0   cephALEXin (KEFLEX) 500 MG capsule Take 1 capsule (500 mg total) by mouth 2 (two) times daily. 14 capsule 0   CONTOUR NEXT TEST test strip Check blood sugar twice daily Dx E11.9 200 strip 3   dapagliflozin propanediol (FARXIGA) 10 MG TABS tablet Take 1 tablet (10 mg total) by mouth daily. 90 tablet 3   diclofenac (VOLTAREN) 75 MG EC tablet Take 1 tablet (75 mg total) by mouth 2 (two) times daily. 30 tablet 0   doxycycline (VIBRA-TABS) 100 MG tablet Take 1 tablet (100 mg total) by mouth 2 (two) times daily. 20 tablet 0   Dulaglutide (TRULICITY) 3 TW/6.5KC SOPN Inject 1 mg as directed once a week. Discontinue 1.81m sq week dose     hydroxypropyl methylcellulose / hypromellose (ISOPTO TEARS / GONIOVISC) 2.5 % ophthalmic solution Place 1 drop into both eyes as needed for dry eyes.     insulin degludec (TRESIBA FLEXTOUCH) 200 UNIT/ML FlexTouch Pen Inject 40 Units into the skin at bedtime. 18 mL 0   Insulin Pen Needle (ULTICARE MICRO PEN NEEDLES) 32G X 4 MM MISC Use twice daily Dx E11.40 200 each 3   levothyroxine (SYNTHROID) 175 MCG tablet TAKE (1) TABLET DAILY BEFORE BREAKFAST. 90 tablet 3   lisinopril (ZESTRIL) 20 MG tablet Take 1 tablet (20 mg total) by mouth daily. 90 tablet 2   metFORMIN (GLUCOPHAGE-XR) 750 MG 24 hr tablet TAKE 2 TABLETS DAILY WITH MORNING MEAL 180 tablet 0   omeprazole (PRILOSEC) 20 MG capsule TAKE (1) CAPSULE DAILY 90 capsule 0   PARoxetine (PAXIL) 40 MG tablet TAKE 1 TABLET DAILY 90 tablet 0   pregabalin (LYRICA) 150 MG capsule TAKE 2 CAPSULES EVERY MORNING AND 2 CAPSULES EVERY EVENING 120 capsule 2   TRELEGY ELLIPTA 100-62.5-25 MCG/INH AEPB INHALE 1 ONCE A DAY AS DIRECTED 60 each 2   zolpidem (AMBIEN) 10 MG tablet Take 1 tablet (10 mg total) by mouth at bedtime. 30 tablet 5   No facility-administered medications prior to visit.    Allergies  Allergen Reactions   Hydrocodone-Acetaminophen Itching and Other (See Comments)    Pt is able to tolerate with Benadryl.     Gadolinium Derivatives Nausea And Vomiting    Review of Systems  Constitutional: Negative.   HENT: Negative.    Respiratory: Negative.    Gastrointestinal: Negative.   Skin:  Negative for color change and rash.  All other systems reviewed and are  negative.     Objective:    Physical Exam Vitals reviewed.  HENT:     Head: Normocephalic.     Nose: Nose normal.  Eyes:     Conjunctiva/sclera: Conjunctivae normal.  Cardiovascular:     Rate and Rhythm: Normal rate and regular rhythm.  Pulmonary:     Effort: Pulmonary effort is normal.     Breath sounds: Normal breath sounds.  Abdominal:     General: Bowel sounds are normal.  Musculoskeletal:     Right forearm: Swelling and tenderness present.  Skin:    General: Skin is dry.     Findings: No erythema.  Neurological:     Mental Status: He is oriented to person, place, and time.  Psychiatric:        Behavior: Behavior normal.    BP 138/88   Pulse 100   Temp (!) 97.4 F (36.3 C) (Temporal)   Ht 5' 8"  (1.727 m)   Wt 197 lb (89.4 kg)   SpO2 97%   BMI 29.95 kg/m  Wt Readings from Last 3 Encounters:  11/07/20 197 lb (89.4 kg)  11/04/20 197 lb (89.4 kg)  08/26/20 198 lb 9.6 oz (90.1 kg)    Health Maintenance Due  Topic Date Due   Zoster Vaccines- Shingrix (1 of 2) Never done   OPHTHALMOLOGY EXAM  05/02/2019   COVID-19 Vaccine (3 - Moderna risk series) 10/10/2019   FOOT EXAM  06/28/2020    There are no preventive care reminders to display for this patient.   Lab Results  Component Value Date   TSH 3.490 08/26/2020   Lab Results  Component Value Date   WBC 8.1 08/26/2020   HGB 16.0 08/26/2020   HCT 46.7 08/26/2020   MCV 95 08/26/2020   PLT 237 08/26/2020   Lab Results  Component Value Date   NA 137 08/26/2020   K 4.5 08/26/2020   CO2 25 08/26/2020   GLUCOSE 201 (H) 08/26/2020   BUN 14 08/26/2020   CREATININE 0.73 (L) 08/26/2020   BILITOT 0.3 08/26/2020   ALKPHOS 101 08/26/2020   AST 19  08/26/2020   ALT 23 08/26/2020   PROT 6.3 08/26/2020   ALBUMIN 4.0 08/26/2020   CALCIUM 9.3 08/26/2020   ANIONGAP 8 01/23/2020   EGFR 108 08/26/2020   Lab Results  Component Value Date   CHOL 115 08/26/2020   Lab Results  Component Value Date   HDL 49 08/26/2020   Lab Results  Component Value Date   LDLCALC 50 08/26/2020   Lab Results  Component Value Date   TRIG 78 08/26/2020   Lab Results  Component Value Date   CHOLHDL 2.3 08/26/2020   Lab Results  Component Value Date   HGBA1C 9.4 (H) 11/07/2020       Assessment & Plan:   Problem List Items Addressed This Visit       Cardiovascular and Mediastinum   Hypertension associated with diabetes ([place])     Endocrine   Type 2 diabetes mellitus treated with insulin ([place]) - Primary     Other   Cellulitis of right hand    Patient is following up today for cellulitis.  Right forearm slightly improved with decreased redness and warmth.  Patient is not compliant with medication and has not started Keflex as prescribed.  Patient seemed a little bit sluggish today and a little unsteady on his feet.  Patient reports he will go to pharmacy to pick up medication today.  Advised patient to  follow-up with unresolved symptoms.  Education provided printed handouts given.        No orders of the defined types were placed in this encounter.    Ivy Lynn, NP

## 2020-11-07 NOTE — Assessment & Plan Note (Signed)
Patient is following up today for cellulitis.  Right forearm slightly improved with decreased redness and warmth.  Patient is not compliant with medication and has not started Keflex as prescribed.  Patient seemed a little bit sluggish today and a little unsteady on his feet.  Patient reports he will go to pharmacy to pick up medication today.  Advised patient to follow-up with unresolved symptoms.  Education provided printed handouts given.

## 2020-11-07 NOTE — Patient Instructions (Signed)
Cellulitis, Adult Cellulitis is a skin infection. The infected area is often warm, red, swollen, and sore. It occurs most often in the arms and lower legs. It is very important to get treated for this condition. What are the causes? This condition is caused by bacteria. The bacteria enter through a break in the skin, such as a cut, burn, insect bite, open sore, or crack. What increases the risk? This condition is more likely to occur in people who: Have a weak body defense system (immune system). Have open cuts, burns, bites, or scrapes on the skin. Are older than 54 years of age. Have a blood sugar problem (diabetes). Have a long-lasting (chronic) liver disease (cirrhosis) or kidney disease. Are very overweight (obese). Have a skin problem, such as: Itchy rash (eczema). Slow movement of blood in the veins (venous stasis). Fluid buildup below the skin (edema). Have been treated with high-energy rays (radiation). Use IV drugs. What are the signs or symptoms? Symptoms of this condition include: Skin that is: Red. Streaking. Spotting. Swollen. Sore or painful when you touch it. Warm. A fever. Chills. Blisters. How is this diagnosed? This condition is diagnosed based on: Medical history. Physical exam. Blood tests. Imaging tests. How is this treated? Treatment for this condition may include: Medicines to treat infections or allergies. Home care, such as: Rest. Placing cold or warm cloths (compresses) on the skin. Hospital care, if the condition is very bad. Follow these instructions at home: Medicines Take over-the-counter and prescription medicines only as told by your doctor. If you were prescribed an antibiotic medicine, take it as told by your doctor. Do not stop taking it even if you start to feel better. General instructions  Drink enough fluid to keep your pee (urine) pale yellow. Do not touch or rub the infected area. Raise (elevate) the infected area above the  level of your heart while you are sitting or lying down. Place cold or warm cloths on the area as told by your doctor. Keep all follow-up visits as told by your doctor. This is important. Contact a doctor if: You have a fever. You do not start to get better after 1-2 days of treatment. Your bone or joint under the infected area starts to hurt after the skin has healed. Your infection comes back. This can happen in the same area or another area. You have a swollen bump in the area. You have new symptoms. You feel ill and have muscle aches and pains. Get help right away if: Your symptoms get worse. You feel very sleepy. You throw up (vomit) or have watery poop (diarrhea) for a long time. You see red streaks coming from the area. Your red area gets larger. Your red area turns dark in color. These symptoms may represent a serious problem that is an emergency. Do not wait to see if the symptoms will go away. Get medical help right away. Call your local emergency services (911 in the [place] Do not drive yourself to the hospital. Summary Cellulitis is a skin infection. The area is often warm, red, swollen, and sore. This condition is treated with medicines, rest, and cold and warm cloths. Take all medicines only as told by your doctor. Tell your doctor if symptoms do not start to get better after 1-2 days of treatment. This information is not intended to replace advice given to you by your health care provider. Make sure you discuss any questions you have with your health care provider. Document Revised: 06/23/2017 Document Reviewed:   06/23/2017 Elsevier Patient Education  2022 Elsevier Inc.  

## 2020-11-10 ENCOUNTER — Other Ambulatory Visit: Payer: Self-pay | Admitting: Family

## 2020-11-10 ENCOUNTER — Ambulatory Visit: Payer: Medicare PPO | Admitting: Family

## 2020-11-10 NOTE — Progress Notes (Signed)
Returning nurse call.

## 2020-11-12 ENCOUNTER — Other Ambulatory Visit: Payer: Self-pay | Admitting: Family

## 2020-11-12 DIAGNOSIS — E118 Type 2 diabetes mellitus with unspecified complications: Secondary | ICD-10-CM

## 2020-11-14 ENCOUNTER — Other Ambulatory Visit: Payer: Self-pay

## 2020-11-14 ENCOUNTER — Encounter: Payer: Self-pay | Admitting: Family

## 2020-11-14 ENCOUNTER — Ambulatory Visit (INDEPENDENT_AMBULATORY_CARE_PROVIDER_SITE_OTHER): Payer: Medicare PPO | Admitting: Family

## 2020-11-14 VITALS — BP 153/88 | HR 105 | Temp 97.3°F | Wt 195.0 lb

## 2020-11-14 DIAGNOSIS — R269 Unspecified abnormalities of gait and mobility: Secondary | ICD-10-CM

## 2020-11-14 DIAGNOSIS — Z9114 Patient's other noncompliance with medication regimen: Secondary | ICD-10-CM | POA: Diagnosis not present

## 2020-11-14 DIAGNOSIS — R296 Repeated falls: Secondary | ICD-10-CM | POA: Diagnosis not present

## 2020-11-14 DIAGNOSIS — Z1211 Encounter for screening for malignant neoplasm of colon: Secondary | ICD-10-CM

## 2020-11-14 DIAGNOSIS — Z8673 Personal history of transient ischemic attack (TIA), and cerebral infarction without residual deficits: Secondary | ICD-10-CM | POA: Diagnosis not present

## 2020-11-14 DIAGNOSIS — E118 Type 2 diabetes mellitus with unspecified complications: Secondary | ICD-10-CM | POA: Diagnosis not present

## 2020-11-14 MED ORDER — TRESIBA FLEXTOUCH 200 UNIT/ML ~~LOC~~ SOPN: 20.0000 [IU] | PEN_INJECTOR | Freq: Every evening | SUBCUTANEOUS | 0 refills | Status: DC

## 2020-11-14 NOTE — Telephone Encounter (Signed)
Pt has been seen in office since this telephone encounter, will close. 

## 2020-11-14 NOTE — Progress Notes (Signed)
Subjective:    Patient ID: Charles Pennington, male    DOB: May 17, 1965, 55 y.o.   MRN: [id number]  / Chief Complaint  Patient presents with   Gait Problem    Off balance    Diabetes    For diabetic shoes   PT presents to the office today for diabetic shoes.   He is also complaining of gait changes and feels off balance.  He states he has been holding a lot of medications to see if this helps. He reports he can be walking and will just fall.  Diabetes He presents for his follow-up diabetic visit. He has type 2 diabetes mellitus. Associated symptoms include blurred vision and foot paresthesias. Symptoms are worsening. Diabetic complications include a CVA, heart disease and peripheral neuropathy. Risk factors for coronary artery disease include dyslipidemia, male sex and hypertension. He is following a generally unhealthy diet. His overall blood glucose range is >200 mg/dl. An ACE inhibitor/angiotensin II receptor blocker is being taken.  Nicotine Dependence Presents for follow-up visit. His urge triggers include company of smokers. The symptoms have been stable. He smokes 1 pack of cigarettes per day.     Review of Systems  Eyes:  Positive for blurred vision.      Objective:   Physical Exam Vitals reviewed.  Constitutional:      General: He is not in acute distress.    Appearance: He is well-developed. He is obese.  HENT:     Head: Normocephalic.  Eyes:     General:        Right eye: No discharge.        Left eye: No discharge.     Pupils: Pupils are equal, round, and reactive to light.  Neck:     Thyroid: No thyromegaly.  Cardiovascular:     Rate and Rhythm: Normal rate and regular rhythm.     Heart sounds: Normal heart sounds. No murmur heard. Pulmonary:     Effort: Pulmonary effort is normal. No respiratory distress.     Breath sounds: Normal breath sounds. No wheezing.  Abdominal:     General: Bowel sounds are normal. There is no distension.     Palpations:  Abdomen is soft.     Tenderness: There is no abdominal tenderness.  Musculoskeletal:        General: No tenderness. Normal range of motion.     Cervical back: Normal range of motion and neck supple.  Skin:    General: Skin is warm and dry.     Findings: No erythema or rash.  Neurological:     Mental Status: He is alert and oriented to person, place, and time.     Cranial Nerves: No cranial nerve deficit.     Motor: Weakness present.     Gait: Gait abnormal.     Deep Tendon Reflexes: Reflexes are normal and symmetric.  Psychiatric:        Behavior: Behavior normal.        Thought Content: Thought content normal.        Judgment: Judgment normal.    Diabetic Foot Exam - Simple   Simple Foot Form Diabetic Foot exam was performed with the following findings: Yes 11/14/2020 11:45 AM  Visual Inspection See comments: Yes Sensation Testing See comments: Yes Pulse Check Comments Negative monofilament, he has callus formation on ball of foot and great toe      BP (!) 153/88   Pulse (!) 105   Temp (!) 97.3 F (  36.3 C) (Temporal)   Wt 195 lb (88.5 kg)   BMI 29.65 kg/m      Assessment & Plan:  PASTOR SGRO comes in today with chief complaint of Gait Problem (Off balance falling he wants MRI of brain ) and Diabetes (For diabetic shoes)   Diagnosis and orders addressed:  1. Diabetes mellitus with complication (HCC) Will decrease Tresiba to 20 units daily. Pt has not been taking medications every day and I am unsure if gait changes are related to lows as patient states his gait is worsen when he takes his medications.  Restart other medications  Low carb diet  - insulin degludec (TRESIBA FLEXTOUCH) 200 UNIT/ML FlexTouch Pen; Inject 20 Units into the skin at bedtime.  Dispense: 18 mL; Refill: 0  2. Gait abnormality MRI pending  - MR Brain W Wo Contrast; Future  3. Falls frequently - MR Brain W Wo Contrast; Future  4. Colon cancer screening - Ambulatory referral to  Gastroenterology  5. Noncompliance with medications  6. H/O: CVA (cerebrovascular accident) - MR Brain W Wo Contrast; Future   Labs reviewed Diabetic shoes form completed Health Maintenance reviewed Diet and exercise encouraged  Follow up plan: 2 weeks    [name], FNP

## 2020-11-14 NOTE — Patient Instructions (Signed)
Diabetes Mellitus and Nutrition, Adult When you have diabetes, or diabetes mellitus, it is very important to have healthy eating habits because your blood sugar (glucose) levels are greatly affected by what you eat and drink. Eating healthy foods in the right amounts, at about the same times every day, can help you:  Control your blood glucose.  Lower your risk of heart disease.  Improve your blood pressure.  Reach or maintain a healthy weight. What can affect my meal plan? Every person with diabetes is different, and each person has different needs for a meal plan. Your health care provider may recommend that you work with a dietitian to make a meal plan that is best for you. Your meal plan may vary depending on factors such as:  The calories you need.  The medicines you take.  Your weight.  Your blood glucose, blood pressure, and cholesterol levels.  Your activity level.  Other health conditions you have, such as heart or kidney disease. How do carbohydrates affect me? Carbohydrates, also called carbs, affect your blood glucose level more than any other type of food. Eating carbs naturally raises the amount of glucose in your blood. Carb counting is a method for keeping track of how many carbs you eat. Counting carbs is important to keep your blood glucose at a healthy level, especially if you use insulin or take certain oral diabetes medicines. It is important to know how many carbs you can safely have in each meal. This is different for every person. Your dietitian can help you calculate how many carbs you should have at each meal and for each snack. How does alcohol affect me? Alcohol can cause a sudden decrease in blood glucose (hypoglycemia), especially if you use insulin or take certain oral diabetes medicines. Hypoglycemia can be a life-threatening condition. Symptoms of hypoglycemia, such as sleepiness, dizziness, and confusion, are similar to symptoms of having too much  alcohol.  Do not drink alcohol if: ? Your health care provider tells you not to drink. ? You are pregnant, may be pregnant, or are planning to become pregnant.  If you drink alcohol: ? Do not drink on an empty stomach. ? Limit how much you use to:  0-1 drink a day for women.  0-2 drinks a day for men. ? Be aware of how much alcohol is in your drink. In the [place] one drink equals one 12 oz bottle of beer (355 mL), one 5 oz glass of wine (148 mL), or one 1 oz glass of hard liquor (44 mL). ? Keep yourself hydrated with water, diet soda, or unsweetened iced tea.  Keep in mind that regular soda, juice, and other mixers may contain a lot of sugar and must be counted as carbs. What are tips for following this plan? Reading food labels  Start by checking the serving size on the "Nutrition Facts" label of packaged foods and drinks. The amount of calories, carbs, fats, and other nutrients listed on the label is based on one serving of the item. Many items contain more than one serving per package.  Check the total grams (g) of carbs in one serving. You can calculate the number of servings of carbs in one serving by dividing the total carbs by 15. For example, if a food has 30 g of total carbs per serving, it would be equal to 2 servings of carbs.  Check the number of grams (g) of saturated fats and trans fats in one serving. Choose foods that have   a low amount or none of these fats.  Check the number of milligrams (mg) of salt (sodium) in one serving. Most people should limit total sodium intake to less than 2,300 mg per day.  Always check the nutrition information of foods labeled as "low-fat" or "nonfat." These foods may be higher in added sugar or refined carbs and should be avoided.  Talk to your dietitian to identify your daily goals for nutrients listed on the label. Shopping  Avoid buying canned, pre-made, or processed foods. These foods tend to be high in fat, sodium, and added  sugar.  Shop around the outside edge of the grocery store. This is where you will most often find fresh fruits and vegetables, bulk grains, fresh meats, and fresh dairy. Cooking  Use low-heat cooking methods, such as baking, instead of high-heat cooking methods like deep frying.  Cook using healthy oils, such as olive, canola, or sunflower oil.  Avoid cooking with butter, cream, or high-fat meats. Meal planning  Eat meals and snacks regularly, preferably at the same times every day. Avoid going long periods of time without eating.  Eat foods that are high in fiber, such as fresh fruits, vegetables, beans, and whole grains. Talk with your dietitian about how many servings of carbs you can eat at each meal.  Eat 4-6 oz (112-168 g) of lean protein each day, such as lean meat, chicken, fish, eggs, or tofu. One ounce (oz) of lean protein is equal to: ? 1 oz (28 g) of meat, chicken, or fish. ? 1 egg. ?  cup (62 g) of tofu.  Eat some foods each day that contain healthy fats, such as avocado, nuts, seeds, and fish.   What foods should I eat? Fruits Berries. Apples. Oranges. Peaches. Apricots. Plums. Grapes. Mango. Papaya. Pomegranate. Kiwi. Cherries. Vegetables Lettuce. Spinach. Leafy greens, including kale, chard, collard greens, and mustard greens. Beets. Cauliflower. Cabbage. Broccoli. Carrots. Green beans. Tomatoes. Peppers. Onions. Cucumbers. Brussels sprouts. Grains Whole grains, such as whole-wheat or whole-grain bread, crackers, tortillas, cereal, and pasta. Unsweetened oatmeal. Quinoa. Brown or wild rice. Meats and other proteins Seafood. Poultry without skin. Lean cuts of poultry and beef. Tofu. Nuts. Seeds. Dairy Low-fat or fat-free dairy products such as milk, yogurt, and cheese. The items listed above may not be a complete list of foods and beverages you can eat. Contact a dietitian for more information. What foods should I avoid? Fruits Fruits canned with  syrup. Vegetables Canned vegetables. Frozen vegetables with butter or cream sauce. Grains Refined white flour and flour products such as bread, pasta, snack foods, and cereals. Avoid all processed foods. Meats and other proteins Fatty cuts of meat. Poultry with skin. Breaded or fried meats. Processed meat. Avoid saturated fats. Dairy Full-fat yogurt, cheese, or milk. Beverages Sweetened drinks, such as soda or iced tea. The items listed above may not be a complete list of foods and beverages you should avoid. Contact a dietitian for more information. Questions to ask a health care provider  Do I need to meet with a diabetes educator?  Do I need to meet with a dietitian?  What number can I call if I have questions?  When are the best times to check my blood glucose? Where to find more information:  American Diabetes Association: diabetes.org  Academy of Nutrition and Dietetics: www.eatright.org  National Institute of Diabetes and Digestive and Kidney Diseases: www.niddk.nih.gov  Association of Diabetes Care and Education Specialists: www.diabeteseducator.org Summary  It is important to have healthy eating   habits because your blood sugar (glucose) levels are greatly affected by what you eat and drink.  A healthy meal plan will help you control your blood glucose and maintain a healthy lifestyle.  Your health care provider may recommend that you work with a dietitian to make a meal plan that is best for you.  Keep in mind that carbohydrates (carbs) and alcohol have immediate effects on your blood glucose levels. It is important to count carbs and to use alcohol carefully. This information is not intended to replace advice given to you by your health care provider. Make sure you discuss any questions you have with your health care provider. Document Revised: 01/09/2019 Document Reviewed: 01/09/2019 Elsevier Patient Education  2021 Elsevier Inc.  

## 2020-11-19 ENCOUNTER — Encounter: Payer: Self-pay | Admitting: Internal Medicine

## 2020-11-21 ENCOUNTER — Telehealth: Payer: Self-pay | Admitting: Family

## 2020-11-21 NOTE — Telephone Encounter (Signed)
[facility] [employer] called to let us know that they received signed order for patient but says they need Dr Dettinger to sign off on it as well.  Can resend to fax # [phone]

## 2020-11-21 NOTE — Telephone Encounter (Signed)
Okay already signed it

## 2020-11-24 ENCOUNTER — Telehealth: Payer: Self-pay | Admitting: Family

## 2020-11-24 ENCOUNTER — Encounter (HOSPITAL_COMMUNITY): Payer: Self-pay

## 2020-11-24 ENCOUNTER — Emergency Department (HOSPITAL_COMMUNITY)
Admission: EM | Admit: 2020-11-24 | Discharge: 2020-11-24 | Discharge status: Against Medical Advice | Payer: Medicare PPO | Attending: Emergency Medicine | Admitting: Emergency Medicine

## 2020-11-24 ENCOUNTER — Other Ambulatory Visit: Payer: Self-pay

## 2020-11-24 DIAGNOSIS — M25522 Pain in left elbow: Secondary | ICD-10-CM | POA: Insufficient documentation

## 2020-11-24 DIAGNOSIS — R296 Repeated falls: Secondary | ICD-10-CM | POA: Insufficient documentation

## 2020-11-24 DIAGNOSIS — W1839XA Other fall on same level, initial encounter: Secondary | ICD-10-CM | POA: Diagnosis not present

## 2020-11-24 DIAGNOSIS — R200 Anesthesia of skin: Secondary | ICD-10-CM | POA: Insufficient documentation

## 2020-11-24 DIAGNOSIS — M25521 Pain in right elbow: Secondary | ICD-10-CM | POA: Diagnosis not present

## 2020-11-24 DIAGNOSIS — Z5321 Procedure and treatment not carried out due to patient leaving prior to being seen by health care provider: Secondary | ICD-10-CM | POA: Diagnosis not present

## 2020-11-24 DIAGNOSIS — R109 Unspecified abdominal pain: Secondary | ICD-10-CM | POA: Diagnosis not present

## 2020-11-24 DIAGNOSIS — R9431 Abnormal electrocardiogram [ECG] [EKG]: Secondary | ICD-10-CM | POA: Diagnosis not present

## 2020-11-24 DIAGNOSIS — G8929 Other chronic pain: Secondary | ICD-10-CM

## 2020-11-24 NOTE — ED Notes (Signed)
Called for vitals no response

## 2020-11-24 NOTE — Telephone Encounter (Signed)
Pt currently at ED.   [name], FNP

## 2020-11-24 NOTE — Telephone Encounter (Signed)
Order signed by Dr. Louanne Skye faxed back to [facility]

## 2020-11-24 NOTE — ED Triage Notes (Signed)
Pt reports numbness in his hands and feet for the past week, has been falling multiple times due to the numbness. Hx of stroke. C.o bilateral elbow pain from falls. Pt a.o, ambulatory in lobby. Pt also reports redness around his umbilicus

## 2020-11-24 NOTE — ED Notes (Signed)
Did not response to last call

## 2020-11-24 NOTE — ED Provider Notes (Signed)
Emergency Medicine Provider Triage Evaluation Note  Charles Pennington , a 55 y.o. male  was evaluated in triage.  Pt complains of bilateral hand numbness, feet numbness, generalized fatigue and weakness complains of abdominal pain nausea states he has not pooped in over a week.  Is uncertain whether he is passing gas.  Also complains of cough which is nonproductive denies any hemoptysis.    Review of Systems  Positive: Nausea, abdominal pain Negative: Fever  Physical Exam  BP 122/85 (BP Location: Left Arm)   Pulse 96   Temp 98.4 F (36.9 C) (Oral)   Resp 18   SpO2 99%  Gen:   Awake, uncomfortable, anxious Resp:  Normal effort  MSK:   Moves extremities without difficulty  Other:   Abd is focally very TTP over umbilicus, some redness here, palpable mass consistent with hernia.   Alert and oriented to self, place, time and event.   Speech is fluent, clear without dysarthria or dysphasia.   Strength 5/5 in upper/lower extremities   Sensation intact in upper/lower extremities    Normal finger-to-nose and feet tapping.  CN I not tested  CN II grossly intact visual fields bilaterally. Did not visualize posterior eye.  CN III, IV, VI PERRLA and EOMs intact bilaterally  CN V Intact sensation to sharp and light touch to the face  CN VII facial movements symmetric  CN VIII not tested  CN IX, X no uvula deviation, symmetric rise of soft palate  CN XI 5/5 SCM and trapezius strength bilaterally  CN XII Midline tongue protrusion, symmetric L/R movements    Medical Decision Making  Medically screening exam initiated at 5:03 PM.  Appropriate orders placed.  Elbert Ewings was informed that the remainder of the evaluation will be completed by another provider, this initial triage assessment does not replace that evaluation, and the importance of remaining in the ED until their evaluation is complete.  Patient with numerous complaints.  Was into the ER today with very tender umbilical  area with what feels like a hernia palpated here.  Will obtain CT imaging.  Also is complaining of over a week of balance issues and bilateral upper and lower extremity numbness although he does have good sensation on examination.  Will obtain CT head.   Solon Augusta [place], [place] 11/24/20 1706    Pricilla Loveless, MD 11/25/20 1529

## 2020-11-25 ENCOUNTER — Ambulatory Visit (HOSPITAL_COMMUNITY)
Admission: RE | Admit: 2020-11-25 | Discharge: 2020-11-25 | Disposition: A | Discharge status: Home / Self Care | Payer: Medicare PPO | Source: Ambulatory Visit | Attending: Family Medicine | Admitting: Family Medicine

## 2020-11-25 ENCOUNTER — Encounter: Payer: Self-pay | Admitting: Family Medicine

## 2020-11-25 ENCOUNTER — Other Ambulatory Visit: Payer: Self-pay

## 2020-11-25 ENCOUNTER — Ambulatory Visit (INDEPENDENT_AMBULATORY_CARE_PROVIDER_SITE_OTHER): Payer: Medicare PPO | Admitting: Family Medicine

## 2020-11-25 ENCOUNTER — Ambulatory Visit (INDEPENDENT_AMBULATORY_CARE_PROVIDER_SITE_OTHER): Payer: Medicare PPO

## 2020-11-25 VITALS — BP 114/80 | HR 91 | Temp 98.3°F | Ht 68.0 in | Wt 189.0 lb

## 2020-11-25 DIAGNOSIS — K5909 Other constipation: Secondary | ICD-10-CM

## 2020-11-25 DIAGNOSIS — K439 Ventral hernia without obstruction or gangrene: Secondary | ICD-10-CM

## 2020-11-25 DIAGNOSIS — R1033 Periumbilical pain: Secondary | ICD-10-CM

## 2020-11-25 DIAGNOSIS — R296 Repeated falls: Secondary | ICD-10-CM | POA: Diagnosis not present

## 2020-11-25 DIAGNOSIS — L0291 Cutaneous abscess, unspecified: Secondary | ICD-10-CM

## 2020-11-25 DIAGNOSIS — R269 Unspecified abnormalities of gait and mobility: Secondary | ICD-10-CM | POA: Diagnosis not present

## 2020-11-25 DIAGNOSIS — R42 Dizziness and giddiness: Secondary | ICD-10-CM

## 2020-11-25 DIAGNOSIS — K5900 Constipation, unspecified: Secondary | ICD-10-CM | POA: Diagnosis not present

## 2020-11-25 DIAGNOSIS — R109 Unspecified abdominal pain: Secondary | ICD-10-CM | POA: Diagnosis not present

## 2020-11-25 MED ORDER — POLYETHYLENE GLYCOL 3350 17 GM/SCOOP PO POWD
17.0000 g | Freq: Every day | ORAL | 1 refills | Status: DC
Start: 2020-11-25 — End: 2020-12-09

## 2020-11-25 MED ORDER — SULFAMETHOXAZOLE-TRIMETHOPRIM 800-160 MG PO TABS: 1.0000 | ORAL_TABLET | Freq: Two times a day (BID) | ORAL | 0 refills | Status: AC

## 2020-11-25 MED ORDER — IOHEXOL 300 MG/ML  SOLN
100.0000 mL | Freq: Once | INTRAMUSCULAR | Status: AC | PRN
  Administered 2020-11-25: 100 mL via INTRAVENOUS

## 2020-11-25 NOTE — Addendum Note (Signed)
Addended by: Sonny Masters on: 11/25/2020 04:28 PM   Modules accepted: Orders

## 2020-11-25 NOTE — Progress Notes (Signed)
Pt returned to office after CT abdomen. Willing to let provider assess abdomen more thoroughly at this time without significant pain. States after the CT something busted and started draining in his belly button. Upon exam, pt has open and draining abscess in umbilicus. Will send in antibiotics. Pt aware of CT results and need for miralax clean out. Directions discussed in detail.

## 2020-11-25 NOTE — Telephone Encounter (Signed)
Referral placed.

## 2020-11-25 NOTE — Progress Notes (Signed)
Subjective:  Patient ID: Charles Pennington, male    DOB: Jan 29, 1966, 55 y.o.   MRN: [id number]  Patient Care Team: Sharion Balloon, FNP as PCP - General (Family Medicine) Sharion Balloon, FNP (Nurse Practitioner) Ilean China, RN as Registered Nurse [place], Royce Macadamia, [place] (Pharmacist) Celestia Khat, [place] (Optometry) Jeanella Anton, NP as Nurse Practitioner (Pain Medicine)   Chief Complaint:  balance issues   HPI: Charles Pennington is a 55 y.o. male presenting on 11/25/2020 for balance issues   Pt presents today for evaluation of ongoing dizziness, falling, and gait instability, MRI pending. He also complains of significant abdominal pain with constipation. States he went to the ED last night and got tired of waiting so he left. He states he has not had a bowel movement in over a week.    Relevant past medical, surgical, family, and social history reviewed and updated as indicated.  Allergies and medications reviewed and updated. Data reviewed: Chart in Epic.   Past Medical History:  Diagnosis Date   Anxiety    Asthma    Diabetes mellitus    Diabetes mellitus without complication ([place])    Hypercholesteremia    Hypertension    Hypothyroidism    Low back pain    Obesity    SOB (shortness of breath)    Stroke [facility]) age 26   Stroke [facility])    Vertigo     Past Surgical History:  Procedure Laterality Date   BACK SURGERY  2013   COLONOSCOPY  2009   Inflammatory changes of the cecum and ascending colon most consistent with infectious etiology, NSAID, ischemia. Suspected resolving infection based on symptomatology.   FOOT SURGERY     Dr Irving Shows   SHOULDER ARTHROSCOPY Right    SPINE SURGERY     WRIST SURGERY      Social History   Socioeconomic History   Marital status: Single    Spouse name: Not on file   Number of children: 2   Years of education: Not on file   Highest education level: 8th grade  Occupational History   Occupation: disabled electrician   Tobacco Use   Smoking status: Every Day    Packs/day: 1.00    Years: 30.00    Pack years: 30.00    Types: Cigarettes   Smokeless tobacco: Never  Vaping Use   Vaping Use: Never used  Substance and Sexual Activity   Alcohol use: Never   Drug use: No   Sexual activity: Yes  Other Topics Concern   Not on file  Social History Narrative   ** Merged History Encounter **    His mom lives with him. Children live nearby   Social Determinants of Health   Financial Resource Strain: High Risk   Difficulty of Paying Living Expenses: Hard  Food Insecurity: Food Insecurity Present   Worried About Charity fundraiser in the Last Year: Often true   Arboriculturist in the Last Year: Sometimes true  Transportation Needs: Not on file  Physical Activity: Inactive   Days of Exercise per Week: 0 days   Minutes of Exercise per Session: 0 min  Stress: No Stress Concern Present   Feeling of Stress : Only a little  Social Connections: Socially Isolated   Frequency of Communication with Friends and Family: More than three times a week   Frequency of Social Gatherings with Friends and Family: More than three times a week   Attends Religious  Services: Never   Marine scientist or Organizations: No   Attends Archivist Meetings: Never   Marital Status: Never married  Human resources officer Violence: Not At Risk   Fear of Current or Ex-Partner: No   Emotionally Abused: No   Physically Abused: No   Sexually Abused: No    Outpatient Encounter Medications as of 11/25/2020  Medication Sig   albuterol (PROVENTIL) (2.5 MG/3ML) 0.083% nebulizer solution NEBULIZE 1 VIAL EVERY 6 HOURS AS NEEDED FOR WHEEZING OR SHORTNESS OF BREATH   albuterol (VENTOLIN HFA) 108 (90 Base) MCG/ACT inhaler INHALE 2 PUFFS INTO THE LUNGS EVERY 6 (SIX) HOURS AS NEEDED FOR WHEEZING.   atorvastatin (LIPITOR) 80 MG tablet TAKE 1 TABLET DAILY   blood glucose meter kit and supplies Dispense based on patient and insurance  preference. Test sugar BID and as needed   CONTOUR NEXT TEST test strip Check blood sugar twice daily Dx E11.9   dapagliflozin propanediol (FARXIGA) 10 MG TABS tablet Take 1 tablet (10 mg total) by mouth daily.   Dulaglutide (TRULICITY) 3 QA/8.3MH SOPN Inject 3 mg into the skin once a week.   hydroxypropyl methylcellulose / hypromellose (ISOPTO TEARS / GONIOVISC) 2.5 % ophthalmic solution Place 1 drop into both eyes as needed for dry eyes.   insulin degludec (TRESIBA FLEXTOUCH) 200 UNIT/ML FlexTouch Pen Inject 20 Units into the skin at bedtime.   Insulin Pen Needle (ULTICARE MICRO PEN NEEDLES) 32G X 4 MM MISC Use twice daily Dx E11.40   levothyroxine (SYNTHROID) 175 MCG tablet TAKE (1) TABLET DAILY BEFORE BREAKFAST.   lisinopril (ZESTRIL) 20 MG tablet Take 1 tablet (20 mg total) by mouth daily.   metFORMIN (GLUCOPHAGE-XR) 750 MG 24 hr tablet TAKE 2 TABLETS DAILY WITH MORNING MEAL   omeprazole (PRILOSEC) 20 MG capsule TAKE (1) CAPSULE DAILY   PARoxetine (PAXIL) 40 MG tablet TAKE 1 TABLET DAILY   polyethylene glycol powder (GLYCOLAX/MIRALAX) 17 GM/SCOOP powder Take 17 g by mouth daily.   pregabalin (LYRICA) 150 MG capsule TAKE 2 CAPSULES EVERY MORNING AND 2 CAPSULES EVERY EVENING   TRELEGY ELLIPTA 100-62.5-25 MCG/INH AEPB INHALE 1 ONCE A DAY AS DIRECTED   zolpidem (AMBIEN) 10 MG tablet Take 1 tablet (10 mg total) by mouth at bedtime.   No facility-administered encounter medications on file as of 11/25/2020.    Allergies  Allergen Reactions   Hydrocodone-Acetaminophen Itching and Other (See Comments)    Pt is able to tolerate with Benadryl.    Gadolinium Derivatives Nausea And Vomiting    Review of Systems  Constitutional:  Negative for activity change, appetite change, chills, diaphoresis, fatigue, fever and unexpected weight change.  HENT: Negative.    Eyes: Negative.   Respiratory:  Negative for cough, chest tightness and shortness of breath.   Cardiovascular:  Negative for chest  pain, palpitations and leg swelling.  Gastrointestinal:  Positive for abdominal distention, abdominal pain, constipation and nausea. Negative for anal bleeding, blood in stool, diarrhea, rectal pain and vomiting.  Endocrine: Negative.   Genitourinary:  Negative for decreased urine volume, difficulty urinating, dysuria, frequency and urgency.  Musculoskeletal:  Positive for gait problem. Negative for arthralgias and myalgias.  Skin: Negative.   Allergic/Immunologic: Negative.   Neurological:  Positive for dizziness. Negative for tremors, seizures, syncope, facial asymmetry, speech difficulty, light-headedness, numbness and headaches.  Hematological: Negative.   Psychiatric/Behavioral:  Negative for confusion, hallucinations, sleep disturbance and suicidal ideas.   All other systems reviewed and are negative.      Objective:  BP 114/80   Pulse 91   Temp 98.3 F (36.8 C)   Ht 5' 8"  (1.727 m)   Wt 189 lb (85.7 kg)   SpO2 98%   BMI 28.74 kg/m    Wt Readings from Last 3 Encounters:  11/25/20 189 lb (85.7 kg)  11/14/20 195 lb (88.5 kg)  11/07/20 197 lb (89.4 kg)    Physical Exam Vitals and nursing note reviewed.  Constitutional:      General: He is in acute distress.     Appearance: Normal appearance. He is well-developed and well-groomed. He is not ill-appearing, toxic-appearing or diaphoretic.  HENT:     Head: Normocephalic and atraumatic.     Jaw: There is normal jaw occlusion.     Right Ear: Hearing normal.     Left Ear: Hearing normal.     Nose: Nose normal.     Mouth/Throat:     Lips: Pink.     Mouth: Mucous membranes are moist.     Pharynx: Oropharynx is clear. Uvula midline.  Eyes:     General: Lids are normal.     Extraocular Movements: Extraocular movements intact.     Conjunctiva/sclera: Conjunctivae normal.     Pupils: Pupils are equal, round, and reactive to light.  Neck:     Thyroid: No thyroid mass, thyromegaly or thyroid tenderness.     Vascular: No  carotid bruit or JVD.     Trachea: Trachea and phonation normal.  Cardiovascular:     Rate and Rhythm: Normal rate and regular rhythm.     Chest Wall: PMI is not displaced.     Pulses: Normal pulses.     Heart sounds: Normal heart sounds. No murmur heard.   No friction rub. No gallop.  Pulmonary:     Effort: Pulmonary effort is normal. No respiratory distress.     Breath sounds: Normal breath sounds. No wheezing.  Abdominal:     General: Abdomen is flat. Bowel sounds are normal. There is no distension or abdominal bruit.     Palpations: Abdomen is soft. There is no hepatomegaly or splenomegaly.     Tenderness: There is abdominal tenderness in the epigastric area and periumbilical area. There is guarding and rebound. There is no right CVA tenderness or left CVA tenderness.     Hernia: A hernia is present. Hernia is present in the umbilical area.  Musculoskeletal:        General: Normal range of motion.     Cervical back: Normal range of motion and neck supple.     Right lower leg: No edema.     Left lower leg: No edema.  Lymphadenopathy:     Cervical: No cervical adenopathy.  Skin:    General: Skin is warm and dry.     Capillary Refill: Capillary refill takes less than 2 seconds.     Coloration: Skin is not cyanotic, jaundiced or pale.     Findings: No rash.  Neurological:     General: No focal deficit present.     Mental Status: He is alert and oriented to person, place, and time.     Cranial Nerves: Cranial nerves are intact.     Sensory: Sensation is intact.     Motor: Motor function is intact.     Coordination: Coordination is intact.     Gait: Gait abnormal and tandem walk abnormal.     Deep Tendon Reflexes: Reflexes are normal and symmetric.  Psychiatric:  Attention and Perception: Attention and perception normal.        Mood and Affect: Mood and affect normal.        Speech: Speech normal.        Behavior: Behavior is agitated. Behavior is cooperative.         Thought Content: Thought content normal.        Cognition and Memory: Cognition and memory normal.        Judgment: Judgment normal.    Results for orders placed or performed in visit on 11/07/20  [facility]  Result Value Ref Range   Glucose 355 (H) 65 - 99 mg/dL   BUN 13 6 - 24 mg/dL   Creatinine, Ser 0.81 0.76 - 1.27 mg/dL   eGFR 105 >59 mL/min/1.73   BUN/Creatinine Ratio 16 9 - 20   Sodium 135 134 - 144 mmol/L   Potassium 4.0 3.5 - 5.2 mmol/L   Chloride 94 (L) 96 - 106 mmol/L   CO2 26 20 - 29 mmol/L   Calcium 9.7 8.7 - 10.2 mg/dL  Bayer DCA Hb A1c Waived  Result Value Ref Range   HB A1C (BAYER DCA - WAIVED) 9.4 (H) 4.8 - 5.6 %     X-Ray: KUB: significant stool burden, no obvious obstruction. No acute findings. Preliminary x-ray reading by Monia Pouch, FNP-C, [facility].   Pertinent labs & imaging results that were available during my care of the patient were reviewed by me and considered in my medical decision making.  Assessment & Plan:  Ares was seen today for balance issues.  Diagnoses and all orders for this visit:  Periumbilical abdominal pain Hernia of abdominal wall Other constipation KUB without acute findings, constipation noted. Abdomen very tender to palpation with guarding, umbilical hernia, concerning for incarceration. Will get STAT CT abdomen.  -     DG Abd 1 View -     Ambulatory referral to Gastroenterology -     polyethylene glycol powder (GLYCOLAX/MIRALAX) 17 GM/SCOOP powder; Take 17 g by mouth daily. -     CT Abdomen Pelvis W Contrast; Future  Gait abnormality Falls frequently Dizziness MRI pending. Referral to neurology placed today.  -     Ambulatory referral to Neurology    Continue all other maintenance medications.  Follow up plan: Return in about 1 month (around 12/26/2020), or if symptoms worsen or fail to improve, for with PCP.   Continue healthy lifestyle choices, including diet (rich in fruits, vegetables, and lean proteins, and  low in salt and simple carbohydrates) and exercise (at least 30 minutes of moderate physical activity daily).  Educational handout given for constipation, dizziness  The above assessment and management plan was discussed with the patient. The patient verbalized understanding of and has agreed to the management plan. Patient is aware to call the clinic if they develop any new symptoms or if symptoms persist or worsen. Patient is aware when to return to the clinic for a follow-up visit. Patient educated on when it is appropriate to go to the emergency department.   Monia Pouch, FNP-C [facility] [phone]

## 2020-11-25 NOTE — Patient Instructions (Signed)
Thank you for coming in to clinic today.  1. Your symptoms are consistent with Constipation, likely cause of your General Abdominal Pain / Cramping. 2. Start with Miralax, prescription was sent to pharmacy. First dose 68g (4 capfuls) in 32oz water over 1 to 2 hours for clean out. Next day start 17g or 1 capful daily, may adjust dose up or down by half a capful every few days. Recommend to take this medicine daily for next 1-2 weeks, you may need to use it longer if needed. - Goal is to have soft regular bowel movement 1-3x daily, if too runny or diarrhea, then reduce dose of the medicine to every other day.  Improve water intake, hydration will help Also recommend increased vegetables, fruits, fiber intake Can try daily Metamucil or Fiber supplement at pharmacy over the counter  Follow-up if symptoms are not improving with bowel movements, or if pain worsens, develop fevers, nausea, vomiting.  Please schedule a follow-up appointment with Michelle Ein Rijo, FNP, in 1 month to follow-up Constipation  If you have any other questions or concerns, please feel free to call the clinic to contact me. You may also schedule an earlier appointment if necessary.  However, if your symptoms get significantly worse, please go to the Emergency Department to seek immediate medical attention.  

## 2020-11-26 ENCOUNTER — Telehealth: Payer: Self-pay | Admitting: Family

## 2020-11-26 NOTE — Telephone Encounter (Signed)
Spoke with patient in detailed and advised to take meds that was rx yesterday. If still no relief with constipation try enema. Patient aware and verbalized understanding.

## 2020-11-27 ENCOUNTER — Other Ambulatory Visit: Payer: Self-pay | Admitting: Family

## 2020-11-28 ENCOUNTER — Ambulatory Visit: Payer: Medicare PPO | Admitting: Family

## 2020-12-01 DIAGNOSIS — M545 Low back pain, unspecified: Secondary | ICD-10-CM | POA: Diagnosis not present

## 2020-12-01 DIAGNOSIS — M961 Postlaminectomy syndrome, not elsewhere classified: Secondary | ICD-10-CM | POA: Diagnosis not present

## 2020-12-02 ENCOUNTER — Telehealth: Payer: Self-pay | Admitting: Family

## 2020-12-02 ENCOUNTER — Other Ambulatory Visit: Payer: Self-pay

## 2020-12-02 ENCOUNTER — Ambulatory Visit (HOSPITAL_COMMUNITY)
Admission: RE | Admit: 2020-12-02 | Discharge: 2020-12-02 | Disposition: A | Discharge status: Home / Self Care | Payer: Medicare PPO | Source: Ambulatory Visit | Attending: Family | Admitting: Family

## 2020-12-02 ENCOUNTER — Other Ambulatory Visit: Payer: Self-pay | Admitting: Family

## 2020-12-02 DIAGNOSIS — Z8673 Personal history of transient ischemic attack (TIA), and cerebral infarction without residual deficits: Secondary | ICD-10-CM

## 2020-12-02 DIAGNOSIS — E119 Type 2 diabetes mellitus without complications: Secondary | ICD-10-CM

## 2020-12-02 DIAGNOSIS — R269 Unspecified abnormalities of gait and mobility: Secondary | ICD-10-CM | POA: Diagnosis not present

## 2020-12-02 DIAGNOSIS — Z794 Long term (current) use of insulin: Secondary | ICD-10-CM

## 2020-12-02 DIAGNOSIS — R296 Repeated falls: Secondary | ICD-10-CM | POA: Diagnosis not present

## 2020-12-02 DIAGNOSIS — E1142 Type 2 diabetes mellitus with diabetic polyneuropathy: Secondary | ICD-10-CM

## 2020-12-02 MED ORDER — GADOBUTROL 1 MMOL/ML IV SOLN
8.5000 mL | Freq: Once | INTRAVENOUS | Status: AC | PRN
  Administered 2020-12-02: 8.5 mL via INTRAVENOUS

## 2020-12-02 NOTE — Telephone Encounter (Signed)
Made patient appt.

## 2020-12-09 ENCOUNTER — Other Ambulatory Visit: Payer: Self-pay

## 2020-12-09 ENCOUNTER — Encounter: Payer: Self-pay | Admitting: Family

## 2020-12-09 ENCOUNTER — Ambulatory Visit (INDEPENDENT_AMBULATORY_CARE_PROVIDER_SITE_OTHER): Payer: Medicare PPO | Admitting: Family

## 2020-12-09 VITALS — BP 140/87 | HR 68 | Temp 98.6°F | Ht 68.0 in | Wt 194.0 lb

## 2020-12-09 DIAGNOSIS — Z9114 Patient's other noncompliance with medication regimen: Secondary | ICD-10-CM

## 2020-12-09 DIAGNOSIS — E1159 Type 2 diabetes mellitus with other circulatory complications: Secondary | ICD-10-CM

## 2020-12-09 DIAGNOSIS — E785 Hyperlipidemia, unspecified: Secondary | ICD-10-CM | POA: Diagnosis not present

## 2020-12-09 DIAGNOSIS — F331 Major depressive disorder, recurrent, moderate: Secondary | ICD-10-CM

## 2020-12-09 DIAGNOSIS — E1169 Type 2 diabetes mellitus with other specified complication: Secondary | ICD-10-CM

## 2020-12-09 DIAGNOSIS — J441 Chronic obstructive pulmonary disease with (acute) exacerbation: Secondary | ICD-10-CM | POA: Diagnosis not present

## 2020-12-09 DIAGNOSIS — E119 Type 2 diabetes mellitus without complications: Secondary | ICD-10-CM

## 2020-12-09 DIAGNOSIS — E039 Hypothyroidism, unspecified: Secondary | ICD-10-CM | POA: Diagnosis not present

## 2020-12-09 DIAGNOSIS — M5440 Lumbago with sciatica, unspecified side: Secondary | ICD-10-CM

## 2020-12-09 DIAGNOSIS — K219 Gastro-esophageal reflux disease without esophagitis: Secondary | ICD-10-CM

## 2020-12-09 DIAGNOSIS — R2681 Unsteadiness on feet: Secondary | ICD-10-CM

## 2020-12-09 DIAGNOSIS — E1142 Type 2 diabetes mellitus with diabetic polyneuropathy: Secondary | ICD-10-CM

## 2020-12-09 DIAGNOSIS — I152 Hypertension secondary to endocrine disorders: Secondary | ICD-10-CM

## 2020-12-09 DIAGNOSIS — R296 Repeated falls: Secondary | ICD-10-CM

## 2020-12-09 DIAGNOSIS — F411 Generalized anxiety disorder: Secondary | ICD-10-CM

## 2020-12-09 DIAGNOSIS — Z794 Long term (current) use of insulin: Secondary | ICD-10-CM

## 2020-12-09 DIAGNOSIS — F17200 Nicotine dependence, unspecified, uncomplicated: Secondary | ICD-10-CM

## 2020-12-09 DIAGNOSIS — G8929 Other chronic pain: Secondary | ICD-10-CM

## 2020-12-09 MED ORDER — LISINOPRIL 10 MG PO TABS
10.0000 mg | ORAL_TABLET | Freq: Every day | ORAL | 3 refills | Status: DC
Start: 2020-12-09 — End: 2021-07-23

## 2020-12-09 NOTE — Progress Notes (Signed)
Subjective:    Patient ID: Charles Pennington, male    DOB: [birth year], 55 y.o.   MRN: [id number]  Chief Complaint  Patient presents with   Follow-up   Cough    3 DAYS    PT presents to the office today with chronic follow up. He is noncompliant with his medications. He has brought in his medications and pills are mixed in bottles and only taking some of his medications.   He reports he continues to fall, but has improved. He had a MRI on 12/02/20 that was negative for acute finding.  Cough This is a new problem. The current episode started in the past 7 days. The problem has been waxing and waning. The problem occurs every few minutes. The cough is Productive of sputum. Associated symptoms include nasal congestion, postnasal drip, shortness of breath and wheezing. Pertinent negatives include no ear congestion or ear pain.  Hypertension This is a chronic problem. The current episode started more than 1 year ago. The problem has been waxing and waning since onset. The problem is uncontrolled. Associated symptoms include anxiety, blurred vision, malaise/fatigue, peripheral edema and shortness of breath. Risk factors for coronary artery disease include dyslipidemia, obesity, male gender and diabetes mellitus. Identifiable causes of hypertension include a thyroid problem.  Diabetes He presents for his follow-up diabetic visit. He has type 2 diabetes mellitus. Hypoglycemia symptoms include nervousness/anxiousness. Associated symptoms include blurred vision, fatigue and foot paresthesias. Symptoms are stable. Diabetic complications include heart disease, nephropathy and peripheral neuropathy. Risk factors for coronary artery disease include diabetes mellitus, hypertension, dyslipidemia and male sex. He is following a generally unhealthy diet. (Does not check BS at home) Eye exam is not current.  Thyroid Problem Presents for follow-up visit. Symptoms include anxiety, constipation, depressed mood and  fatigue. Patient reports no diarrhea or dry skin.  Back Pain This is a chronic problem. The current episode started more than 1 year ago. The problem occurs intermittently. The problem has been waxing and waning since onset. The pain is present in the lumbar spine. The quality of the pain is described as aching. The pain is at a severity of 9/10. The pain is moderate.  Anxiety Presents for follow-up visit. Symptoms include depressed mood, excessive worry, irritability, nervous/anxious behavior, restlessness and shortness of breath. Symptoms occur most days. The severity of symptoms is moderate.    Depression        This is a chronic problem.  The current episode started more than 1 year ago.   The problem occurs intermittently.  Associated symptoms include fatigue, helplessness, irritable and restlessness.  Associated symptoms include no hopelessness and no appetite change.  Past treatments include SSRIs - Selective serotonin reuptake inhibitors.  Past medical history includes thyroid problem and anxiety.   COPD Has cough. Smoking less than a pack a day. Intermittent SOB.    Review of Systems  Constitutional:  Positive for fatigue, irritability and malaise/fatigue. Negative for appetite change.  HENT:  Positive for postnasal drip. Negative for ear pain.   Eyes:  Positive for blurred vision.  Respiratory:  Positive for cough, shortness of breath and wheezing.   Gastrointestinal:  Positive for constipation. Negative for diarrhea.  Musculoskeletal:  Positive for back pain.  Psychiatric/Behavioral:  Positive for depression. The patient is nervous/anxious.   All other systems reviewed and are negative.     Objective:   Physical Exam Vitals reviewed.  Constitutional:      General: [name]. He is not in  acute distress.    Appearance: He is well-developed.  HENT:     Head: Normocephalic.     Right Ear: Tympanic membrane normal.     Left Ear: Tympanic membrane normal.  Eyes:      General:        Right eye: No discharge.        Left eye: No discharge.     Pupils: Pupils are equal, round, and reactive to light.  Neck:     Thyroid: No thyromegaly.  Cardiovascular:     Rate and Rhythm: Normal rate and regular rhythm.     Heart sounds: Normal heart sounds. No murmur heard. Pulmonary:     Effort: Pulmonary effort is normal. No respiratory distress.     Breath sounds: Normal breath sounds. No wheezing.  Abdominal:     General: Bowel sounds are normal. There is no distension.     Palpations: Abdomen is soft.     Tenderness: There is no abdominal tenderness.  Musculoskeletal:        General: No tenderness. Normal range of motion.     Cervical back: Normal range of motion and neck supple.  Skin:    General: Skin is warm and dry.     Findings: No erythema or rash.  Neurological:     Mental Status: He is alert and oriented to person, place, and time.     Cranial Nerves: No cranial nerve deficit.     Motor: Weakness present.     Gait: Gait abnormal.     Deep Tendon Reflexes: Reflexes are normal and symmetric.  Psychiatric:        Behavior: Behavior normal.        Thought Content: Thought content normal.        Judgment: Judgment normal.      BP (!) 148/86   Pulse 68   Temp 98.6 F (37 C) (Temporal)   Ht _0  (1.727 m)   Wt 194 lb (88 kg)   SpO2 99%   BMI 29.50 kg/m      Assessment & Plan:  GABREIL YONKERS comes in today with chief complaint of Follow-up and Cough (3 DAYS )   Diagnosis and orders addressed:  1. Hypertension associated with diabetes ([employer]) - CMP14+EGFR - CBC with Differential/Platelet - lisinopril (ZESTRIL) 10 MG tablet; Take 1 tablet (10 mg total) by mouth daily.  Dispense: 90 tablet; Refill: 3  2. COPD with acute exacerbation (HCC) - CMP14+EGFR - CBC with Differential/Platelet  3. Gastroesophageal reflux disease, unspecified whether esophagitis present - CMP14+EGFR - CBC with Differential/Platelet  4. Diabetic  polyneuropathy associated with type 2 diabetes mellitus (HCC) - CMP14+EGFR - CBC with Differential/Platelet  5. Hypothyroidism, unspecified type - CMP14+EGFR - CBC with Differential/Platelet - TSH  6. Hyperlipidemia associated with type 2 diabetes mellitus (HCC) - CMP14+EGFR - CBC with Differential/Platelet  7. Type 2 diabetes mellitus treated with insulin (HCC) - CMP14+EGFR - CBC with Differential/Platelet  8. Hyperlipidemia, unspecified hyperlipidemia type - CMP14+EGFR - CBC with Differential/Platelet  9. Noncompliance with medications - CMP14+EGFR - CBC with Differential/Platelet  10. GAD (generalized anxiety disorder) - CMP14+EGFR - CBC with Differential/Platelet  11. Moderate episode of recurrent major depressive disorder (HCC) - CMP14+EGFR - CBC with Differential/Platelet  12. Morbid obesity ([place]) - CMP14+EGFR - CBC with Differential/Platelet  13. Chronic low back pain with sciatica, sciatica laterality unspecified, unspecified back pain laterality - CMP14+EGFR - CBC with Differential/Platelet  14. SMOKER - CMP14+EGFR - CBC with Differential/Platelet  15.  Frequent falls  16. Unstable gait Keep Neurologists follow up   Labs pending Went through patient's medications and discarded old medications. Went through list and labeled each medication and why he takes it.  Health Maintenance reviewed Diet and exercise encouraged  Follow up plan: 1 month   Evelina Dun, FNP

## 2020-12-09 NOTE — Patient Instructions (Signed)
Diabetes Mellitus and Nutrition, Adult When you have diabetes, or diabetes mellitus, it is very important to have healthy eating habits because your blood sugar (glucose) levels are greatly affected by what you eat and drink. Eating healthy foods in the right amounts, at about the same times every day, can help you:  Control your blood glucose.  Lower your risk of heart disease.  Improve your blood pressure.  Reach or maintain a healthy weight. What can affect my meal plan? Every person with diabetes is different, and each person has different needs for a meal plan. Your health care provider may recommend that you work with a dietitian to make a meal plan that is best for you. Your meal plan may vary depending on factors such as:  The calories you need.  The medicines you take.  Your weight.  Your blood glucose, blood pressure, and cholesterol levels.  Your activity level.  Other health conditions you have, such as heart or kidney disease. How do carbohydrates affect me? Carbohydrates, also called carbs, affect your blood glucose level more than any other type of food. Eating carbs naturally raises the amount of glucose in your blood. Carb counting is a method for keeping track of how many carbs you eat. Counting carbs is important to keep your blood glucose at a healthy level, especially if you use insulin or take certain oral diabetes medicines. It is important to know how many carbs you can safely have in each meal. This is different for every person. Your dietitian can help you calculate how many carbs you should have at each meal and for each snack. How does alcohol affect me? Alcohol can cause a sudden decrease in blood glucose (hypoglycemia), especially if you use insulin or take certain oral diabetes medicines. Hypoglycemia can be a life-threatening condition. Symptoms of hypoglycemia, such as sleepiness, dizziness, and confusion, are similar to symptoms of having too much  alcohol.  Do not drink alcohol if: ? Your health care provider tells you not to drink. ? You are pregnant, may be pregnant, or are planning to become pregnant.  If you drink alcohol: ? Do not drink on an empty stomach. ? Limit how much you use to:  0-1 drink a day for women.  0-2 drinks a day for men. ? Be aware of how much alcohol is in your drink. In the [place] one drink equals one 12 oz bottle of beer (355 mL), one 5 oz glass of wine (148 mL), or one 1 oz glass of hard liquor (44 mL). ? Keep yourself hydrated with water, diet soda, or unsweetened iced tea.  Keep in mind that regular soda, juice, and other mixers may contain a lot of sugar and must be counted as carbs. What are tips for following this plan? Reading food labels  Start by checking the serving size on the "Nutrition Facts" label of packaged foods and drinks. The amount of calories, carbs, fats, and other nutrients listed on the label is based on one serving of the item. Many items contain more than one serving per package.  Check the total grams (g) of carbs in one serving. You can calculate the number of servings of carbs in one serving by dividing the total carbs by 15. For example, if a food has 30 g of total carbs per serving, it would be equal to 2 servings of carbs.  Check the number of grams (g) of saturated fats and trans fats in one serving. Choose foods that have   a low amount or none of these fats.  Check the number of milligrams (mg) of salt (sodium) in one serving. Most people should limit total sodium intake to less than 2,300 mg per day.  Always check the nutrition information of foods labeled as "low-fat" or "nonfat." These foods may be higher in added sugar or refined carbs and should be avoided.  Talk to your dietitian to identify your daily goals for nutrients listed on the label. Shopping  Avoid buying canned, pre-made, or processed foods. These foods tend to be high in fat, sodium, and added  sugar.  Shop around the outside edge of the grocery store. This is where you will most often find fresh fruits and vegetables, bulk grains, fresh meats, and fresh dairy. Cooking  Use low-heat cooking methods, such as baking, instead of high-heat cooking methods like deep frying.  Cook using healthy oils, such as olive, canola, or sunflower oil.  Avoid cooking with butter, cream, or high-fat meats. Meal planning  Eat meals and snacks regularly, preferably at the same times every day. Avoid going long periods of time without eating.  Eat foods that are high in fiber, such as fresh fruits, vegetables, beans, and whole grains. Talk with your dietitian about how many servings of carbs you can eat at each meal.  Eat 4-6 oz (112-168 g) of lean protein each day, such as lean meat, chicken, fish, eggs, or tofu. One ounce (oz) of lean protein is equal to: ? 1 oz (28 g) of meat, chicken, or fish. ? 1 egg. ?  cup (62 g) of tofu.  Eat some foods each day that contain healthy fats, such as avocado, nuts, seeds, and fish.   What foods should I eat? Fruits Berries. Apples. Oranges. Peaches. Apricots. Plums. Grapes. Mango. Papaya. Pomegranate. Kiwi. Cherries. Vegetables Lettuce. Spinach. Leafy greens, including kale, chard, collard greens, and mustard greens. Beets. Cauliflower. Cabbage. Broccoli. Carrots. Green beans. Tomatoes. Peppers. Onions. Cucumbers. Brussels sprouts. Grains Whole grains, such as whole-wheat or whole-grain bread, crackers, tortillas, cereal, and pasta. Unsweetened oatmeal. Quinoa. Brown or wild rice. Meats and other proteins Seafood. Poultry without skin. Lean cuts of poultry and beef. Tofu. Nuts. Seeds. Dairy Low-fat or fat-free dairy products such as milk, yogurt, and cheese. The items listed above may not be a complete list of foods and beverages you can eat. Contact a dietitian for more information. What foods should I avoid? Fruits Fruits canned with  syrup. Vegetables Canned vegetables. Frozen vegetables with butter or cream sauce. Grains Refined white flour and flour products such as bread, pasta, snack foods, and cereals. Avoid all processed foods. Meats and other proteins Fatty cuts of meat. Poultry with skin. Breaded or fried meats. Processed meat. Avoid saturated fats. Dairy Full-fat yogurt, cheese, or milk. Beverages Sweetened drinks, such as soda or iced tea. The items listed above may not be a complete list of foods and beverages you should avoid. Contact a dietitian for more information. Questions to ask a health care provider  Do I need to meet with a diabetes educator?  Do I need to meet with a dietitian?  What number can I call if I have questions?  When are the best times to check my blood glucose? Where to find more information:  American Diabetes Association: diabetes.org  Academy of Nutrition and Dietetics: www.eatright.org  National Institute of Diabetes and Digestive and Kidney Diseases: www.niddk.nih.gov  Association of Diabetes Care and Education Specialists: www.diabeteseducator.org Summary  It is important to have healthy eating   habits because your blood sugar (glucose) levels are greatly affected by what you eat and drink.  A healthy meal plan will help you control your blood glucose and maintain a healthy lifestyle.  Your health care provider may recommend that you work with a dietitian to make a meal plan that is best for you.  Keep in mind that carbohydrates (carbs) and alcohol have immediate effects on your blood glucose levels. It is important to count carbs and to use alcohol carefully. This information is not intended to replace advice given to you by your health care provider. Make sure you discuss any questions you have with your health care provider. Document Revised: 01/09/2019 Document Reviewed: 01/09/2019 Elsevier Patient Education  2021 Elsevier Inc.  

## 2020-12-10 LAB — CMP14+EGFR
ALT: 21 IU/L (ref 0–44)
AST: 22 IU/L (ref 0–40)
Albumin/Globulin Ratio: 1.7 (ref 1.2–2.2)
Albumin: 4.3 g/dL (ref 3.8–4.9)
Alkaline Phosphatase: 90 IU/L (ref 44–121)
BUN/Creatinine Ratio: 10 (ref 9–20)
BUN: 8 mg/dL (ref 6–24)
Bilirubin Total: 0.3 mg/dL (ref 0.0–1.2)
CO2: 25 mmol/L (ref 20–29)
Calcium: 9.9 mg/dL (ref 8.7–10.2)
Chloride: 99 mmol/L (ref 96–106)
Creatinine, Ser: 0.82 mg/dL (ref 0.76–1.27)
Globulin, Total: 2.6 g/dL (ref 1.5–4.5)
Glucose: 122 mg/dL — ABNORMAL HIGH (ref 70–99)
Potassium: 4.6 mmol/L (ref 3.5–5.2)
Sodium: 138 mmol/L (ref 134–144)
Total Protein: 6.9 g/dL (ref 6.0–8.5)
eGFR: 104 mL/min/{1.73_m2} (ref >59)

## 2020-12-10 LAB — CBC WITH DIFFERENTIAL/PLATELET
Basophils Absolute: 0.1 10*3/uL (ref 0.0–0.2)
Basos: 1 %
EOS (ABSOLUTE): 0.2 10*3/uL (ref 0.0–0.4)
Eos: 2 %
Hematocrit: 48.9 % (ref 37.5–51.0)
Hemoglobin: 16.4 g/dL (ref 13.0–17.7)
Immature Grans (Abs): 0 10*3/uL (ref 0.0–0.1)
Immature Granulocytes: 0 %
Lymphocytes Absolute: 4.2 10*3/uL — ABNORMAL HIGH (ref 0.7–3.1)
Lymphs: 49 %
MCH: 31.9 pg (ref 26.6–33.0)
MCHC: 33.5 g/dL (ref 31.5–35.7)
MCV: 95 fL (ref 79–97)
Monocytes Absolute: 0.5 10*3/uL (ref 0.1–0.9)
Monocytes: 6 %
Neutrophils Absolute: 3.6 10*3/uL (ref 1.4–7.0)
Neutrophils: 42 %
Platelets: 258 10*3/uL (ref 150–450)
RBC: 5.14 x10E6/uL (ref 4.14–5.80)
RDW: 12.1 % (ref 11.6–15.4)
WBC: 8.6 10*3/uL (ref 3.4–10.8)

## 2020-12-10 LAB — TSH: TSH: 14.2 u[IU]/mL — ABNORMAL HIGH (ref 0.450–4.500)

## 2020-12-11 ENCOUNTER — Encounter: Payer: Self-pay | Admitting: *Deleted

## 2020-12-11 ENCOUNTER — Telehealth: Payer: Self-pay | Admitting: Family

## 2020-12-11 DIAGNOSIS — E119 Type 2 diabetes mellitus without complications: Secondary | ICD-10-CM | POA: Diagnosis not present

## 2020-12-11 NOTE — Telephone Encounter (Signed)
Appt made with Tiffany

## 2020-12-12 ENCOUNTER — Ambulatory Visit: Payer: Medicare PPO | Admitting: Family Medicine

## 2020-12-15 ENCOUNTER — Other Ambulatory Visit: Payer: Self-pay | Admitting: Family

## 2020-12-15 MED ORDER — LEVOTHYROXINE SODIUM 200 MCG PO TABS: 200.0000 ug | ORAL_TABLET | Freq: Every day | ORAL | 2 refills | Status: DC

## 2020-12-25 DIAGNOSIS — M545 Low back pain, unspecified: Secondary | ICD-10-CM | POA: Diagnosis not present

## 2020-12-31 ENCOUNTER — Ambulatory Visit: Payer: Medicare PPO

## 2021-01-01 DIAGNOSIS — M545 Low back pain, unspecified: Secondary | ICD-10-CM | POA: Diagnosis not present

## 2021-01-09 ENCOUNTER — Other Ambulatory Visit: Payer: Self-pay | Admitting: Family

## 2021-01-09 DIAGNOSIS — F331 Major depressive disorder, recurrent, moderate: Secondary | ICD-10-CM

## 2021-01-09 DIAGNOSIS — F411 Generalized anxiety disorder: Secondary | ICD-10-CM

## 2021-01-16 ENCOUNTER — Other Ambulatory Visit: Payer: Self-pay | Admitting: Family

## 2021-01-16 DIAGNOSIS — J209 Acute bronchitis, unspecified: Secondary | ICD-10-CM

## 2021-01-16 DIAGNOSIS — R062 Wheezing: Secondary | ICD-10-CM

## 2021-01-20 ENCOUNTER — Ambulatory Visit (INDEPENDENT_AMBULATORY_CARE_PROVIDER_SITE_OTHER): Payer: Medicare PPO | Admitting: Family

## 2021-01-20 ENCOUNTER — Encounter: Payer: Self-pay | Admitting: Family

## 2021-01-20 VITALS — BP 147/97 | HR 103 | Temp 97.2°F | Ht 68.0 in | Wt 201.6 lb

## 2021-01-20 DIAGNOSIS — F411 Generalized anxiety disorder: Secondary | ICD-10-CM

## 2021-01-20 DIAGNOSIS — E11621 Type 2 diabetes mellitus with foot ulcer: Secondary | ICD-10-CM

## 2021-01-20 DIAGNOSIS — E1142 Type 2 diabetes mellitus with diabetic polyneuropathy: Secondary | ICD-10-CM

## 2021-01-20 DIAGNOSIS — J441 Chronic obstructive pulmonary disease with (acute) exacerbation: Secondary | ICD-10-CM

## 2021-01-20 DIAGNOSIS — L97419 Non-pressure chronic ulcer of right heel and midfoot with unspecified severity: Secondary | ICD-10-CM

## 2021-01-20 DIAGNOSIS — Z794 Long term (current) use of insulin: Secondary | ICD-10-CM

## 2021-01-20 DIAGNOSIS — E1169 Type 2 diabetes mellitus with other specified complication: Secondary | ICD-10-CM | POA: Diagnosis not present

## 2021-01-20 DIAGNOSIS — I152 Hypertension secondary to endocrine disorders: Secondary | ICD-10-CM | POA: Diagnosis not present

## 2021-01-20 DIAGNOSIS — F331 Major depressive disorder, recurrent, moderate: Secondary | ICD-10-CM

## 2021-01-20 DIAGNOSIS — F1910 Other psychoactive substance abuse, uncomplicated: Secondary | ICD-10-CM

## 2021-01-20 DIAGNOSIS — E1159 Type 2 diabetes mellitus with other circulatory complications: Secondary | ICD-10-CM

## 2021-01-20 DIAGNOSIS — E119 Type 2 diabetes mellitus without complications: Secondary | ICD-10-CM

## 2021-01-20 DIAGNOSIS — E785 Hyperlipidemia, unspecified: Secondary | ICD-10-CM

## 2021-01-20 DIAGNOSIS — E118 Type 2 diabetes mellitus with unspecified complications: Secondary | ICD-10-CM | POA: Diagnosis not present

## 2021-01-20 DIAGNOSIS — Z9114 Patient's other noncompliance with medication regimen: Secondary | ICD-10-CM

## 2021-01-20 DIAGNOSIS — E039 Hypothyroidism, unspecified: Secondary | ICD-10-CM | POA: Diagnosis not present

## 2021-01-20 LAB — BAYER DCA HB A1C WAIVED: HB A1C (BAYER DCA - WAIVED): 9.9 % — ABNORMAL HIGH (ref 4.8–5.6)

## 2021-01-20 MED ORDER — DOXYCYCLINE HYCLATE 100 MG PO TABS: 100.0000 mg | ORAL_TABLET | Freq: Two times a day (BID) | ORAL | 0 refills | Status: DC

## 2021-01-20 NOTE — Progress Notes (Signed)
Subjective:    Patient ID: Charles Pennington, male    DOB: January 26, 1966, 55 y.o.   MRN: [id number]  Chief Complaint  Patient presents with   Medical Management of Chronic Issues   Wound Check    FOOT   PT presents to the office today with chronic follow up. He is noncompliant with his medications.   He is followed by Neurosurgeon for chronic back pain. He is suppose to have back surgery, but has not been able to get his blood glucose stable.   He is complaining of right foot pain and wound on the ball of his foot that started a month ago after he got new shoes.  Wound Check He was originally treated 5 to 10 days ago.  Diabetes He presents for his follow-up diabetic visit. He has type 2 diabetes mellitus. Associated symptoms include foot paresthesias. Pertinent negatives for diabetes include no blurred vision. Symptoms are stable. Diabetic complications include heart disease and peripheral neuropathy. Risk factors for coronary artery disease include dyslipidemia, diabetes mellitus, male sex, hypertension and sedentary lifestyle. His overall blood glucose range is 180-200 mg/dl.  Hypertension This is a chronic problem. The current episode started more than 1 year ago. The problem has been waxing and waning since onset. The problem is uncontrolled. Associated symptoms include malaise/fatigue and peripheral edema. Pertinent negatives include no blurred vision or shortness of breath. Risk factors for coronary artery disease include diabetes mellitus, dyslipidemia, obesity and male gender. The current treatment provides moderate improvement.  Back Pain This is a chronic problem. The current episode started more than 1 year ago. The problem occurs intermittently. The problem has been waxing and waning since onset. The pain is present in the lumbar spine. The quality of the pain is described as aching. The pain is at a severity of 10/10. The pain is moderate.     Review of Systems   Constitutional:  Positive for malaise/fatigue.  Eyes:  Negative for blurred vision.  Respiratory:  Negative for shortness of breath.   Musculoskeletal:  Positive for back pain.  All other systems reviewed and are negative.     Objective:   Physical Exam Vitals reviewed.  Constitutional:      General: He is not in acute distress.    Appearance: He is well-developed. He is obese.  HENT:     Head: Normocephalic.     Right Ear: Tympanic membrane normal.     Left Ear: Tympanic membrane normal.  Eyes:     General:        Right eye: No discharge.        Left eye: No discharge.     Pupils: Pupils are equal, round, and reactive to light.  Neck:     Thyroid: No thyromegaly.  Cardiovascular:     Rate and Rhythm: Normal rate and regular rhythm.     Heart sounds: Normal heart sounds. No murmur heard. Pulmonary:     Effort: Pulmonary effort is normal. No respiratory distress.     Breath sounds: No wheezing or rhonchi.  Abdominal:     General: Bowel sounds are normal. There is no distension.     Palpations: Abdomen is soft.     Tenderness: There is no abdominal tenderness.  Musculoskeletal:        General: No tenderness. Normal range of motion.     Cervical back: Normal range of motion and neck supple.  Skin:    General: Skin is warm and dry.  Findings: No erythema or rash.     Comments: Right foot wound fissure on right ball of foot  Neurological:     Mental Status: He is alert and oriented to person, place, and time.     Cranial Nerves: No cranial nerve deficit.     Deep Tendon Reflexes: Reflexes are normal and symmetric.  Psychiatric:        Mood and Affect: Mood is anxious.        Behavior: Behavior is agitated, aggressive and hyperactive.        Thought Content: Thought content normal.        Judgment: Judgment normal.      BP (!) 147/97   Pulse (!) 103   Temp (!) 97.2 F (36.2 C) (Temporal)   Ht _0  (1.727 m)   Wt 201 lb 9.6 oz (91.4 kg)   BMI 30.65 kg/m       Assessment & Plan:  JDYN PARKERSON comes in today with chief complaint of Medical Management of Chronic Issues and Wound Check (FOOT)   Diagnosis and orders addressed:  1. Hypertension associated with diabetes ([place]) - CBC with Differential/Platelet - CMP14+EGFR - Lipid panel - Bayer DCA Hb A1c Waived  2. Type 2 diabetes mellitus treated with insulin ([facility]) - Bayer DCA Hb A1c Waived  3. COPD with acute exacerbation ([place])  4. Diabetic polyneuropathy associated with type 2 diabetes mellitus ([place])  5. Hyperlipidemia associated with type 2 diabetes mellitus ([place])  6. Hypothyroidism, unspecified type  7. Diabetes mellitus with complication ([place])  8. Substance abuse ([place])  9. Noncompliance with medications  10. Hyperlipidemia, unspecified hyperlipidemia type  11. Moderate episode of recurrent major depressive disorder ([place])  12. GAD (generalized anxiety disorder)  13. Morbid obesity ([place])  14. Diabetic ulcer of right midfoot associated with type 2 diabetes mellitus, unspecified ulcer stage ([facility]) Start doxycyline  - doxycycline (VIBRA-TABS) 100 MG tablet; Take 1 tablet (100 mg total) by mouth 2 (two) times daily.  Dispense: 20 tablet; Refill: 0   Labs pending Health Maintenance reviewed Diet and exercise encouraged  Follow up plan: 1 month   Evelina Dun, FNP

## 2021-01-20 NOTE — Patient Instructions (Signed)

## 2021-01-21 LAB — CMP14+EGFR
ALT: 17 IU/L (ref 0–44)
AST: 24 IU/L (ref 0–40)
Albumin/Globulin Ratio: 2.1 (ref 1.2–2.2)
Albumin: 4.4 g/dL (ref 3.8–4.9)
Alkaline Phosphatase: 87 IU/L (ref 44–121)
BUN/Creatinine Ratio: 15 (ref 9–20)
BUN: 13 mg/dL (ref 6–24)
Bilirubin Total: 0.3 mg/dL (ref 0.0–1.2)
CO2: 27 mmol/L (ref 20–29)
Calcium: 9.4 mg/dL (ref 8.7–10.2)
Chloride: 98 mmol/L (ref 96–106)
Creatinine, Ser: 0.85 mg/dL (ref 0.76–1.27)
Globulin, Total: 2.1 g/dL (ref 1.5–4.5)
Glucose: 188 mg/dL — ABNORMAL HIGH (ref 70–99)
Potassium: 3.9 mmol/L (ref 3.5–5.2)
Sodium: 137 mmol/L (ref 134–144)
Total Protein: 6.5 g/dL (ref 6.0–8.5)
eGFR: 103 mL/min/{1.73_m2} (ref >59)

## 2021-01-21 LAB — CBC WITH DIFFERENTIAL/PLATELET
Basophils Absolute: 0.1 10*3/uL (ref 0.0–0.2)
Basos: 1 %
EOS (ABSOLUTE): 0.4 10*3/uL (ref 0.0–0.4)
Eos: 5 %
Hematocrit: 42.3 % (ref 37.5–51.0)
Hemoglobin: 14.5 g/dL (ref 13.0–17.7)
Immature Grans (Abs): 0 10*3/uL (ref 0.0–0.1)
Immature Granulocytes: 0 %
Lymphocytes Absolute: 3.9 10*3/uL — ABNORMAL HIGH (ref 0.7–3.1)
Lymphs: 46 %
MCH: 31.5 pg (ref 26.6–33.0)
MCHC: 34.3 g/dL (ref 31.5–35.7)
MCV: 92 fL (ref 79–97)
Monocytes Absolute: 0.4 10*3/uL (ref 0.1–0.9)
Monocytes: 5 %
Neutrophils Absolute: 3.6 10*3/uL (ref 1.4–7.0)
Neutrophils: 43 %
Platelets: 273 10*3/uL (ref 150–450)
RBC: 4.61 x10E6/uL (ref 4.14–5.80)
RDW: 12.8 % (ref 11.6–15.4)
WBC: 8.4 10*3/uL (ref 3.4–10.8)

## 2021-01-21 LAB — LIPID PANEL
Chol/HDL Ratio: 2.5 ratio (ref 0.0–5.0)
Cholesterol, Total: 139 mg/dL (ref 100–199)
HDL: 56 mg/dL (ref >39)
LDL Chol Calc (NIH): 64 mg/dL (ref 0–99)
Triglycerides: 104 mg/dL (ref 0–149)
VLDL Cholesterol Cal: 19 mg/dL (ref 5–40)

## 2021-01-27 ENCOUNTER — Other Ambulatory Visit: Payer: Self-pay | Admitting: Family

## 2021-01-27 DIAGNOSIS — E118 Type 2 diabetes mellitus with unspecified complications: Secondary | ICD-10-CM

## 2021-01-27 MED ORDER — TRESIBA FLEXTOUCH 200 UNIT/ML ~~LOC~~ SOPN: 25.0000 [IU] | PEN_INJECTOR | Freq: Every evening | SUBCUTANEOUS | 0 refills | Status: DC

## 2021-01-29 ENCOUNTER — Other Ambulatory Visit: Payer: Self-pay | Admitting: Family

## 2021-01-30 ENCOUNTER — Encounter: Payer: Self-pay | Admitting: Family Medicine

## 2021-02-03 ENCOUNTER — Ambulatory Visit (INDEPENDENT_AMBULATORY_CARE_PROVIDER_SITE_OTHER): Payer: Medicare PPO

## 2021-02-03 ENCOUNTER — Telehealth: Payer: Self-pay | Admitting: Family

## 2021-02-03 ENCOUNTER — Ambulatory Visit (INDEPENDENT_AMBULATORY_CARE_PROVIDER_SITE_OTHER): Payer: Medicare PPO | Admitting: Family Medicine

## 2021-02-03 ENCOUNTER — Encounter: Payer: Self-pay | Admitting: Family Medicine

## 2021-02-03 VITALS — BP 94/58 | HR 96 | Temp 97.5°F | Ht 68.0 in | Wt 209.0 lb

## 2021-02-03 DIAGNOSIS — M79641 Pain in right hand: Secondary | ICD-10-CM

## 2021-02-03 MED ORDER — DICLOFENAC SODIUM 75 MG PO TBEC: 75.0000 mg | DELAYED_RELEASE_TABLET | Freq: Two times a day (BID) | ORAL | 0 refills | Status: DC

## 2021-02-03 NOTE — Telephone Encounter (Signed)
Patient aware he walked in that he will need to get his A1c down.

## 2021-02-03 NOTE — Telephone Encounter (Signed)
Wants to know if you will clear him for surgery ?

## 2021-02-03 NOTE — Patient Instructions (Signed)
Hand Pain °Many things can cause hand pain. Some common causes are: °An injury. °Repeating the same movement with your hand over and over (overuse). °Osteoporosis. °Arthritis. °Lumps in the tendons or joints of the hand and wrist (ganglion cysts). °Nerve compression syndromes (carpal tunnel syndrome). °Inflammation of the tendons (tendinitis). °Infection. °Follow these instructions at home: °Pay attention to any changes in your symptoms. Take these actions to help with your discomfort: °Managing pain, stiffness, and swelling ° °Take over-the-counter and prescription medicines only as told by your health care provider. °Wear a hand splint or support as told by your health care provider. °If directed, put ice on the affected area: °Put ice in a plastic bag. °Place a towel between your skin and the bag. °Leave the ice on for 20 minutes, 2-3 times a day. °Activity °Take breaks from repetitive activity often. °Avoid activities that make your pain worse. °Minimize stress on your hands and wrists as much as possible. °Do stretches or exercises as told by your health care provider. °Do not do activities that make your pain worse. °Contact a health care provider if: °Your pain does not get better after a few days of self-care. °Your pain gets worse. °Your pain affects your ability to do your daily activities. °Get help right away if: °Your hand becomes warm, red, or swollen. °Your hand is numb or tingling. °Your hand is extremely swollen or deformed. °Your hand or fingers turn white or blue. °You cannot move your hand, wrist, or fingers. °Summary °Many things can cause hand pain. °Contact your health care provider if your pain does not get better after a few days of self care. °Minimize stress on your hands and wrists as much as possible. °Do not do activities that make your pain worse. °This information is not intended to replace advice given to you by your health care provider. Make sure you discuss any questions you have  with your health care provider. °Document Revised: 05/22/2020 Document Reviewed: 05/22/2020 °Elsevier Patient Education © 2022 [employer] ° °

## 2021-02-03 NOTE — Progress Notes (Signed)
Acute Office Visit  Subjective:    Patient ID: Charles Pennington, male    DOB: 12-Sep-1965, 55 y.o.   MRN: [id number]  Chief Complaint  Patient presents with   Hand Pain    HPI Patient is in today for right hand pain x 2 month. The pain is in the MCP and PIP of his 3rd and 4th fingers. The pain is achy and constant. It has gotten worse. It is worse at night. It does feel stiff in the morning. He has some mild swelling. He has decreased ROM and strength in these fingers. He has tried tylenol, advil, salon pas, tramadol. He denies numbness or tingling. Denies erythema, fever, injury, or wound.  Past Medical History:  Diagnosis Date   Anxiety    Asthma    Diabetes mellitus    Diabetes mellitus without complication ([place])    Hypercholesteremia    Hypertension    Hypothyroidism    Low back pain    Obesity    SOB (shortness of breath)    Stroke [facility]) age 48   Stroke [facility])    Vertigo     Past Surgical History:  Procedure Laterality Date   BACK SURGERY  2013   COLONOSCOPY  2009   Inflammatory changes of the cecum and ascending colon most consistent with infectious etiology, NSAID, ischemia. Suspected resolving infection based on symptomatology.   FOOT SURGERY     Dr Irving Shows   SHOULDER ARTHROSCOPY Right    SPINE SURGERY     WRIST SURGERY      Family History  Problem Relation Age of Onset   Hypertension Father    Suicidality Father    Diabetes Father    Diabetes Mother    Colon cancer Neg Hx    Inflammatory bowel disease Neg Hx     Social History   Socioeconomic History   Marital status: Single    Spouse name: Not on file   Number of children: 2   Years of education: Not on file   Highest education level: 8th grade  Occupational History   Occupation: disabled Clinical biochemist  Tobacco Use   Smoking status: Every Day    Packs/day: 1.00    Years: 30.00    Pack years: 30.00    Types: Cigarettes   Smokeless tobacco: Never  Vaping Use   Vaping Use: Never used   Substance and Sexual Activity   Alcohol use: Never   Drug use: No   Sexual activity: Yes  Other Topics Concern   Not on file  Social History Narrative   ** Merged History Encounter **    His mom lives with him. Children live nearby   Social Determinants of Health   Financial Resource Strain: High Risk   Difficulty of Paying Living Expenses: Hard  Food Insecurity: Food Insecurity Present   Worried About Charity fundraiser in the Last Year: Often true   Arboriculturist in the Last Year: Sometimes true  Transportation Needs: Not on file  Physical Activity: Inactive   Days of Exercise per Week: 0 days   Minutes of Exercise per Session: 0 min  Stress: No Stress Concern Present   Feeling of Stress : Only a little  Social Connections: Socially Isolated   Frequency of Communication with Friends and Family: More than three times a week   Frequency of Social Gatherings with Friends and Family: More than three times a week   Attends Religious Services: Never   Active Member  of Clubs or Organizations: No   Attends Archivist Meetings: Never   Marital Status: Never married  Human resources officer Violence: Not At Risk   Fear of Current or Ex-Partner: No   Emotionally Abused: No   Physically Abused: No   Sexually Abused: No    Outpatient Medications Prior to Visit  Medication Sig Dispense Refill   albuterol (PROVENTIL) (2.5 MG/3ML) 0.083% nebulizer solution NEBULIZE 1 VIAL EVERY 6 HOURS AS NEEDED FOR WHEEZING OR SHORTNESS OF BREATH 180 mL 0   albuterol (VENTOLIN HFA) 108 (90 Base) MCG/ACT inhaler INHALE 2 PUFFS INTO THE LUNGS EVERY 6 (SIX) HOURS AS NEEDED FOR WHEEZING. 8.5 g 0   atorvastatin (LIPITOR) 80 MG tablet TAKE 1 TABLET DAILY 30 tablet 4   blood glucose meter kit and supplies Dispense based on patient and insurance preference. Test sugar BID and as needed 1 each 0   CONTOUR NEXT TEST test strip CHECK BLOOD SUGAR UP TO TWICE DAILY 100 strip 3   dapagliflozin propanediol  (FARXIGA) 10 MG TABS tablet Take 1 tablet (10 mg total) by mouth daily. 90 tablet 3   Dulaglutide (TRULICITY) 3 ZW/2.5EN SOPN Inject 3 mg into the skin once a week. 6 mL 0   insulin degludec (TRESIBA FLEXTOUCH) 200 UNIT/ML FlexTouch Pen Inject 26 Units into the skin at bedtime. 18 mL 0   Insulin Pen Needle (ULTICARE MICRO PEN NEEDLES) 32G X 4 MM MISC USE AS DIRECTED TWICE A DAY 100 each 3   levothyroxine (SYNTHROID) 200 MCG tablet Take 1 tablet (200 mcg total) by mouth daily. 90 tablet 2   lisinopril (ZESTRIL) 10 MG tablet Take 1 tablet (10 mg total) by mouth daily. 90 tablet 3   metFORMIN (GLUCOPHAGE-XR) 750 MG 24 hr tablet TAKE 2 TABLETS DAILY WITH MORNING MEAL 180 tablet 0   omeprazole (PRILOSEC) 20 MG capsule TAKE (1) CAPSULE DAILY 90 capsule 0   PARoxetine (PAXIL) 40 MG tablet TAKE 1 TABLET DAILY 90 tablet 0   pregabalin (LYRICA) 150 MG capsule TAKE 2 CAPSULES EVERY MORNING & 1 CAPSULES EVERY EVENING 108 capsule 2   TRELEGY ELLIPTA 100-62.5-25 MCG/ACT AEPB INHALE 1 ONCE A DAY AS DIRECTED 60 each 0   zolpidem (AMBIEN) 10 MG tablet Take 1 tablet (10 mg total) by mouth at bedtime. 30 tablet 5   doxycycline (VIBRA-TABS) 100 MG tablet Take 1 tablet (100 mg total) by mouth 2 (two) times daily. 20 tablet 0   No facility-administered medications prior to visit.    Allergies  Allergen Reactions   Hydrocodone-Acetaminophen Itching and Other (See Comments)    Pt is able to tolerate with Benadryl.    Gadolinium Derivatives Nausea And Vomiting    Gadavist given on 12/02/2020 without incident.  Patient stated he never had a reaction to Gadolinium before.    Review of Systems As per HPI.     Objective:    Physical Exam Vitals and nursing note reviewed.  Constitutional:      General: He is not in acute distress.    Appearance: He is not ill-appearing, toxic-appearing or diaphoretic.  Pulmonary:     Effort: Pulmonary effort is normal. No respiratory distress.  Musculoskeletal:     Right  hand: Swelling (3rd and 4th finger) and bony tenderness (MCP of 3rd and 4th finger) present. Decreased range of motion. Decreased strength. Normal sensation. Normal capillary refill.  Skin:    General: Skin is warm and dry.  Neurological:     Mental Status: He is alert  and oriented to person, place, and time.  Psychiatric:        Mood and Affect: Mood normal.        Behavior: Behavior normal.    BP (!) 94/58    Pulse 96    Temp (!) 97.5 F (36.4 C) (Temporal)    Ht _0  (1.727 m)    Wt 209 lb (94.8 kg)    BMI 31.78 kg/m  Wt Readings from Last 3 Encounters:  02/03/21 209 lb (94.8 kg)  01/20/21 201 lb 9.6 oz (91.4 kg)  12/09/20 194 lb (88 kg)    Health Maintenance Due  Topic Date Due   Zoster Vaccines- Shingrix (1 of 2) Never done   COLONOSCOPY (Pts 45-21yr Insurance coverage will need to be confirmed)  Never done   OPHTHALMOLOGY EXAM  05/02/2019   COVID-19 Vaccine (3 - Moderna risk series) 10/10/2019   Pneumococcal Vaccine [id number] old (3 - PPSV23 if available, else PCV20) 10/04/2020    There are no preventive care reminders to display for this patient.   Lab Results  Component Value Date   TSH 14.200 (H) 12/09/2020   Lab Results  Component Value Date   WBC 8.4 01/20/2021   HGB 14.5 01/20/2021   HCT 42.3 01/20/2021   MCV 92 01/20/2021   PLT 273 01/20/2021   Lab Results  Component Value Date   NA 137 01/20/2021   K 3.9 01/20/2021   CO2 27 01/20/2021   GLUCOSE 188 (H) 01/20/2021   BUN 13 01/20/2021   CREATININE 0.85 01/20/2021   BILITOT 0.3 01/20/2021   ALKPHOS 87 01/20/2021   AST 24 01/20/2021   ALT 17 01/20/2021   PROT 6.5 01/20/2021   ALBUMIN 4.4 01/20/2021   CALCIUM 9.4 01/20/2021   ANIONGAP 8 01/23/2020   EGFR 103 01/20/2021   Lab Results  Component Value Date   CHOL 139 01/20/2021   Lab Results  Component Value Date   HDL 56 01/20/2021   Lab Results  Component Value Date   LDLCALC 64 01/20/2021   Lab Results  Component Value Date    TRIG 104 01/20/2021   Lab Results  Component Value Date   CHOLHDL 2.5 01/20/2021   Lab Results  Component Value Date   HGBA1C 9.9 (H) 01/20/2021       Assessment & Plan:   MFrederikwas seen today for hand pain.  Diagnoses and all orders for this visit:  Right hand pain Xray today in office, report pending. Likely arthritis. Will notify patient of results. Voltaren as below. Recent GFR was normal. Heat, ice, stretching.  -     DG Hand Complete Right; Future -     diclofenac (VOLTAREN) 75 MG EC tablet; Take 1 tablet (75 mg total) by mouth 2 (two) times daily.  Return to office for new or worsening symptoms.  The patient indicates understanding of these issues and agrees with the plan.   TGwenlyn Perking FNP

## 2021-02-05 ENCOUNTER — Other Ambulatory Visit: Payer: Self-pay | Admitting: Family

## 2021-02-05 DIAGNOSIS — E118 Type 2 diabetes mellitus with unspecified complications: Secondary | ICD-10-CM

## 2021-02-06 ENCOUNTER — Telehealth: Payer: Self-pay | Admitting: Family

## 2021-02-06 NOTE — Telephone Encounter (Signed)
Do not see where anyone had tried to call him today- did you or your nurse try to call patient ?

## 2021-02-11 ENCOUNTER — Telehealth: Payer: Self-pay | Admitting: Family

## 2021-02-11 DIAGNOSIS — M5440 Lumbago with sciatica, unspecified side: Secondary | ICD-10-CM

## 2021-02-11 NOTE — Telephone Encounter (Signed)
REFERRAL REQUEST Telephone Note  Have you been seen at our office for this problem? yes (Advise that they may need an appointment with their PCP before a referral can be done)  Reason for Referral: sciatic nerve pain Referral discussed with patient: yes   Best contact number of patient for referral team: [phone]    Has patient been seen by a specialist for this issue before: yes  Patient provider preference for referral: physical therapy Patient location preference for referral: physical therapy office next door   Patient notified that referrals can take up to a week or longer to process. If they haven't heard anything within a week they should call back and speak with the referral department.

## 2021-02-12 NOTE — Telephone Encounter (Signed)
Referral placed.

## 2021-02-18 ENCOUNTER — Other Ambulatory Visit: Payer: Self-pay | Admitting: Family Medicine

## 2021-02-18 DIAGNOSIS — M79641 Pain in right hand: Secondary | ICD-10-CM

## 2021-02-19 ENCOUNTER — Other Ambulatory Visit: Payer: Self-pay

## 2021-02-19 ENCOUNTER — Ambulatory Visit: Payer: Medicare PPO | Attending: Family | Admitting: Physical Therapy

## 2021-02-19 DIAGNOSIS — M5440 Lumbago with sciatica, unspecified side: Secondary | ICD-10-CM | POA: Insufficient documentation

## 2021-02-19 DIAGNOSIS — G8929 Other chronic pain: Secondary | ICD-10-CM | POA: Diagnosis not present

## 2021-02-19 DIAGNOSIS — M5441 Lumbago with sciatica, right side: Secondary | ICD-10-CM | POA: Insufficient documentation

## 2021-02-19 DIAGNOSIS — R293 Abnormal posture: Secondary | ICD-10-CM | POA: Insufficient documentation

## 2021-02-19 NOTE — Therapy (Signed)
[facility] [place], [place], [address] Phone: [phone]   Fax:  [phone]  Physical Therapy Evaluation  Patient Details  Name: Charles Pennington MRN: [id number] Date of Birth: 1965-12-03 Referring Provider (PT): Evelina Dun   Encounter Date: 02/19/2021   PT End of Session - 02/19/21 1212     Visit Number 1    Number of Visits 4    Date for PT Re-Evaluation 03/19/21    PT Start Time 1118    PT Stop Time 1203    PT Time Calculation (min) 45 min    Activity Tolerance Patient tolerated treatment well    Behavior During Therapy [facility] for tasks assessed/performed             Past Medical History:  Diagnosis Date   Anxiety    Asthma    Diabetes mellitus    Diabetes mellitus without complication ([place])    Hypercholesteremia    Hypertension    Hypothyroidism    Low back pain    Obesity    SOB (shortness of breath)    Stroke [facility]) age 56   Stroke [facility])    Vertigo     Past Surgical History:  Procedure Laterality Date   BACK SURGERY  2013   COLONOSCOPY  2009   Inflammatory changes of the cecum and ascending colon most consistent with infectious etiology, NSAID, ischemia. Suspected resolving infection based on symptomatology.   FOOT SURGERY     Dr Irving Shows   SHOULDER ARTHROSCOPY Right    SPINE SURGERY     WRIST SURGERY      There were no vitals filed for this visit.    Subjective Assessment - 02/19/21 1219     Subjective COVID-19 screen performed prior to patient entering clinic.  The patient returns to PT today with continued c/o low back pain with radiation into his right buttocks.  His pain is rated at a 9/10 today.  He states he is unable to walk long distances or stand long due to pain.  He states he is expected to have lumbar surgery in the very near future and is trying to get his A1C down prior.  He reports ~15 falls over the last 6 months but none over the last 2 months since walking with a walking  stick.  He would like to have a few therapy sessions prior to his surgery.    Pertinent History DM, HTN, stroke, [id number]), shoulder, wrist and foot surgery.  Peripheral neuropathy.    How long can you stand comfortably? Less than 5 minutes.    How long can you walk comfortably? Very limited community distances.    Patient Stated Goals Have surgery and walk better.    Currently in Pain? Yes    Pain Score 9     Pain Location Back    Pain Orientation Right;Posterior    Pain Descriptors / Indicators Aching;Sharp    Pain Type Chronic pain    Pain Onset More than a month ago    Pain Frequency Constant    Aggravating Factors  Walking/standing.    Pain Relieving Factors Electrical stimulation.                [facility] PT Assessment - 02/19/21 0001       Assessment   Medical Diagnosis Chronic low back pain with sciatica.    Referring Provider (PT) Evelina Dun      Precautions   Precautions Fall    Precaution Comments  Supervised gait at all times.  Patientr has been using a walking stick.      Restrictions   Weight Bearing Restrictions No      Balance Screen   Has the patient fallen in the past 6 months Yes    How many times? 15    Has the patient had a decrease in activity level because of a fear of falling?  Yes    Is the patient reluctant to leave their home because of a fear of falling?  No      Home Environment   Living Environment Private residence      Prior Function   Level of Independence Independent      Posture/Postural Control   Posture/Postural Control Postural limitations    Postural Limitations Rounded Shoulders;Forward head;Decreased lumbar lordosis      Deep Tendon Reflexes   DTR Assessment Site Patella;Achilles    Patella DTR --   Unable to elicit right Patellar reflex, left is normal.   Achilles DTR 0      ROM / Strength   AROM / PROM / Strength AROM      AROM   Overall AROM Comments Active trunk flexion limited by 50% and active extension is 10  degrees.      Strength   Overall Strength Comments Essentially normal LE strength.      Palpation   Palpation comment Patient reporting tenderness at L2 to L4 bilaterally with c/o radiation into the right buttock region.      Special Tests   Other special tests (+) Romberg.      Ambulation/Gait   Gait Pattern Shuffle;Scissoring;Ataxic;Lateral trunk lean to right;Poor foot clearance - left;Poor foot clearance - right      Standardized Balance Assessment   Standardized Balance Assessment Berg Balance Test      Berg Balance Test   Sit to Stand Able to stand  independently using hands    Standing Unsupported Needs several tries to stand 30 seconds unsupported    Sitting with Back Unsupported but Feet Supported on Floor or Stool Able to sit safely and securely 2 minutes    Stand to Sit Controls descent by using hands    Transfers Able to transfer safely, definite need of hands    Standing Unsupported with Eyes Closed Needs help to keep from falling    Standing Unsupported with Feet Together Needs help to attain position and unable to hold for 15 seconds    From Standing, Reach Forward with Outstretched Arm Can reach forward >12 cm safely (5")    From Standing Position, Pick up Object from Floor Able to pick up shoe, needs supervision    From Standing Position, Turn to Look Behind Over each Shoulder Turn sideways only but maintains balance    Turn 360 Degrees Needs close supervision or verbal cueing    Standing Unsupported, Alternately Place Feet on Step/Stool Able to complete 4 steps without aid or supervision    Standing Unsupported, One Foot in Front Needs help to step but can hold 15 seconds    Standing on One Leg Tries to lift leg/unable to hold 3 seconds but remains standing independently    Total Score 27                        Objective measurements completed on examination: See above findings.       [facility] PT Treatment/Exercise - 02/19/21 0001  Modalities   Modalities Electrical Stimulation;Moist Heat      Moist Heat Therapy   Number Minutes Moist Heat 15 Minutes      Electrical Stimulation   Electrical Stimulation Location Upper lumbar    Electrical Stimulation Action IFC at 80-150 Hz.    Electrical Stimulation Parameters 40% scan x 15 minutes.    Electrical Stimulation Goals Tone;Pain                          PT Long Term Goals - 02/19/21 1249       PT LONG TERM GOAL #1   Title Independent with a HEP.    Time 4    Period Weeks    Status New      PT LONG TERM GOAL #2   Title Stand 20 minutes with pain not > 5/10.    Baseline Pain to 10/10 within 5 minutes of standing.    Time 4    Period Weeks    Status New      PT LONG TERM GOAL #3   Title Ambulate 500 feet with walking stick with  low back pain-level not > 5/10.    Time 4    Period Weeks    Status New                    Plan - 02/19/21 1237     Clinical Impression Statement The patient presents to OPPT with continued c/o low back pain with radiation into his right buttock.  He states he to have surgery in the very near future.  He is quite tender to palpation at L2 to L4 bilaterally.  He has mutiple gait deviations and is now ambulating wiht a walking stick due to multiple falls.  His Berg score is 27/56.    Personal Factors and Comorbidities Comorbidity 1;Comorbidity 3+;Comorbidity 2    Comorbidities DM, HTN, stroke, Back, shoulder, wrist and foot surgery, peripheral neuropathy.    Examination-Activity Limitations Locomotion Level;Other;Sit;Stand    Examination-Participation Restrictions Other    Stability/Clinical Decision Making Evolving/Moderate complexity    Clinical Decision Making Low    Rehab Potential Fair    PT Frequency 1x / week    PT Duration 4 weeks    PT Treatment/Interventions ADLs/Self Care Home Management;Cryotherapy;Electrical Stimulation;Ultrasound;Moist Heat;Therapeutic activities;Therapeutic exercise;Manual  techniques;Patient/family education;Passive range of motion    PT Next Visit Plan Core exercise progression.  Modalities as needed.    Consulted and Agree with Plan of Care Patient             Patient will benefit from skilled therapeutic intervention in order to improve the following deficits and impairments:  Abnormal gait, Increased muscle spasms, Decreased activity tolerance, Pain, Postural dysfunction, Decreased range of motion  Visit Diagnosis: Abnormal posture - Plan: PT plan of care cert/re-cert  Chronic bilateral low back pain with right-sided sciatica - Plan: PT plan of care cert/re-cert     Problem List Patient Active Problem List   Diagnosis Date Noted   Unstable gait 12/09/2020   COPD with acute exacerbation ([place]) 01/23/2020   Peripheral neuropathy 01/22/2020   Diabetes mellitus with complication ([place]) 99991111   Substance abuse ([place]) 01/22/2020   Sciatica 01/22/2020   HLD (hyperlipidemia) 01/22/2020   Noncompliance with medications 01/21/2017   Duodenal ulcer 07/23/2016   Cocaine abuse ([place]) 12/12/2015   Pain medication agreement broken 12/12/2015   Insomnia 10/28/2015   Pain medication agreement signed 07/23/2015   Morbid  obesity ([place]) 06/26/2015   Chronic back pain 06/23/2015   Rupture of ulnar collateral ligament of thumb 03/01/2014   Memory difficulties 08/15/2013   Diabetic neuropathy associated with type 2 diabetes mellitus ([place]) 08/15/2013   GERD (gastroesophageal reflux disease) 07/19/2013   GAD (generalized anxiety disorder) 07/19/2013   Depression 07/19/2013   Ataxia, late effect of cerebrovascular disease 07/05/2011   Postlaminectomy syndrome, lumbar region 07/05/2011   Hyperlipidemia associated with type 2 diabetes mellitus ([place]) 12/30/2009   SMOKER 12/30/2009   Colitis, acute 05/27/2008   Hypothyroidism 05/23/2008   Type 2 diabetes mellitus treated with insulin ([place]) 05/23/2008   Hypertension associated with diabetes ([place]) 05/23/2008    History of CVA (cerebrovascular accident) 05/23/2008   FATTY LIVER DISEASE 05/23/2008   ABDOMINAL PAIN 05/23/2008    An Lannan, [place], PT 02/19/2021, 12:58 PM  [facility] [address] [place], [place], [address] Phone: [phone]   Fax:  [phone]  Name: Charles Pennington MRN: [id number] Date of Birth: September 23, 1965

## 2021-02-20 ENCOUNTER — Ambulatory Visit: Payer: Medicare PPO | Admitting: Family

## 2021-02-20 ENCOUNTER — Other Ambulatory Visit: Payer: Self-pay | Admitting: Family

## 2021-02-20 DIAGNOSIS — J209 Acute bronchitis, unspecified: Secondary | ICD-10-CM

## 2021-02-20 DIAGNOSIS — R062 Wheezing: Secondary | ICD-10-CM

## 2021-02-23 ENCOUNTER — Encounter: Payer: Self-pay | Admitting: Family

## 2021-02-25 ENCOUNTER — Ambulatory Visit: Payer: Medicare PPO

## 2021-02-25 ENCOUNTER — Other Ambulatory Visit: Payer: Self-pay

## 2021-02-25 ENCOUNTER — Encounter: Payer: Self-pay | Admitting: Family

## 2021-02-25 ENCOUNTER — Ambulatory Visit (INDEPENDENT_AMBULATORY_CARE_PROVIDER_SITE_OTHER): Payer: Medicare PPO | Admitting: Family

## 2021-02-25 VITALS — BP 107/82 | HR 85 | Temp 97.4°F | Ht 68.0 in | Wt 206.0 lb

## 2021-02-25 DIAGNOSIS — Z794 Long term (current) use of insulin: Secondary | ICD-10-CM

## 2021-02-25 DIAGNOSIS — M79641 Pain in right hand: Secondary | ICD-10-CM | POA: Diagnosis not present

## 2021-02-25 DIAGNOSIS — E119 Type 2 diabetes mellitus without complications: Secondary | ICD-10-CM

## 2021-02-25 DIAGNOSIS — M5440 Lumbago with sciatica, unspecified side: Secondary | ICD-10-CM | POA: Diagnosis not present

## 2021-02-25 DIAGNOSIS — M5441 Lumbago with sciatica, right side: Secondary | ICD-10-CM | POA: Diagnosis not present

## 2021-02-25 DIAGNOSIS — R293 Abnormal posture: Secondary | ICD-10-CM

## 2021-02-25 DIAGNOSIS — J441 Chronic obstructive pulmonary disease with (acute) exacerbation: Secondary | ICD-10-CM

## 2021-02-25 DIAGNOSIS — G8929 Other chronic pain: Secondary | ICD-10-CM | POA: Diagnosis not present

## 2021-02-25 MED ORDER — DICLOFENAC SODIUM 75 MG PO TBEC: 75.0000 mg | DELAYED_RELEASE_TABLET | Freq: Two times a day (BID) | ORAL | 1 refills | Status: DC

## 2021-02-25 MED ORDER — DICLOFENAC SODIUM 1 % EX GEL: 4.0000 g | Freq: Four times a day (QID) | CUTANEOUS | 2 refills | Status: DC

## 2021-02-25 NOTE — Therapy (Signed)
[facility] [place], [place], [address] Phone: [phone]   Fax:  [phone]  Physical Therapy Treatment  Patient Details  Name: Charles Pennington MRN: [id number] Date of Birth: [birth year] Referring Provider (PT): Evelina Dun   Encounter Date: 02/25/2021   PT End of Session - 02/25/21 0954     Visit Number 2    Number of Visits 4    Date for PT Re-Evaluation 03/19/21    PT Start Time 0954    PT Stop Time 1030    PT Time Calculation (min) 36 min    Activity Tolerance Patient tolerated treatment well    Behavior During Therapy [facility] for tasks assessed/performed             Past Medical History:  Diagnosis Date   Anxiety    Asthma    Diabetes mellitus    Diabetes mellitus without complication ([address])    Hypercholesteremia    Hypertension    Hypothyroidism    Low back pain    Obesity    SOB (shortness of breath)    Stroke [facility]) age 33   Stroke [facility])    Vertigo     Past Surgical History:  Procedure Laterality Date   BACK SURGERY  2013   COLONOSCOPY  2009   Inflammatory changes of the cecum and ascending colon most consistent with infectious etiology, NSAID, ischemia. Suspected resolving infection based on symptomatology.   FOOT SURGERY     Dr Irving Shows   SHOULDER ARTHROSCOPY Right    SPINE SURGERY     WRIST SURGERY      There were no vitals filed for this visit.   Subjective Assessment - 02/25/21 0953     Subjective COVID-19 screen performed prior to patient entering clinic.  Patient reports that his back is not doing to good today. He notes that he has to go do bloodwork today.    Pertinent History DM, HTN, stroke, [id number]), shoulder, wrist and foot surgery.  Peripheral neuropathy.    How long can you stand comfortably? Less than 5 minutes.    How long can you walk comfortably? Very limited community distances.    Patient Stated Goals Have surgery and walk better.    Currently in Pain? Yes     Pain Score 9     Pain Location Back    Pain Onset More than a month ago                               [facility] Adult PT Treatment/Exercise - 02/25/21 0001       Lumbar Exercises: Stretches   Lower Trunk Rotation --   20 reps     Lumbar Exercises: Aerobic   Nustep L2 x 9 minutes   limited by right hip and knee pain     Lumbar Exercises: Supine   Other Supine Lumbar Exercises March   20 reps each     Modalities   Modalities Electrical Stimulation;Moist Heat      Moist Heat Therapy   Number Minutes Moist Heat 15 Minutes    Moist Heat Location Lumbar Spine      Electrical Stimulation   Electrical Stimulation Location R lumbar paraspinals and QL    Electrical Stimulation Action IFC 80-150 Hz    Electrical Stimulation Parameters 40% scan x 15 minutes    Electrical Stimulation Goals Tone;Pain  PT Long Term Goals - 02/19/21 1249       PT LONG TERM GOAL #1   Title Independent with a HEP.    Time 4    Period Weeks    Status New      PT LONG TERM GOAL #2   Title Stand 20 minutes with pain not > 5/10.    Baseline Pain to 10/10 within 5 minutes of standing.    Time 4    Period Weeks    Status New      PT LONG TERM GOAL #3   Title Ambulate 500 feet with walking stick with  low back pain-level not > 5/10.    Time 4    Period Weeks    Status New                   Plan - 02/25/21 0955     Clinical Impression Statement Patient presented to treatment late which limited his ability to be introduced to new interventions. He reported increased low back today as "my sciatic nerve was acting up today." He was introduced to multiple supine interventions for improved lumbar mobility and stability. He required continuous cuing with these activities for slow and controlled mobility as he attempted to rush through these interventions. Electrical stimulation with moist heat was the most effective at reducing his familiar  symptoms as he reported feeling better upon the conclusion of treatment. He continues to require skilled physical therapy to address his remaining impairments to maximize his functional mobility.    Personal Factors and Comorbidities Comorbidity 1;Comorbidity 3+;Comorbidity 2    Comorbidities DM, HTN, stroke, Back, shoulder, wrist and foot surgery, peripheral neuropathy.    Examination-Activity Limitations Locomotion Level;Other;Sit;Stand    Examination-Participation Restrictions Other    Stability/Clinical Decision Making Evolving/Moderate complexity    Rehab Potential Fair    PT Frequency 1x / week    PT Duration 4 weeks    PT Treatment/Interventions ADLs/Self Care Home Management;Cryotherapy;Electrical Stimulation;Ultrasound;Moist Heat;Therapeutic activities;Therapeutic exercise;Manual techniques;Patient/family education;Passive range of motion    PT Next Visit Plan Core exercise progression.  Modalities as needed.    Consulted and Agree with Plan of Care Patient             Patient will benefit from skilled therapeutic intervention in order to improve the following deficits and impairments:  Abnormal gait, Increased muscle spasms, Decreased activity tolerance, Pain, Postural dysfunction, Decreased range of motion  Visit Diagnosis: Abnormal posture  Chronic bilateral low back pain with right-sided sciatica     Problem List Patient Active Problem List   Diagnosis Date Noted   Unstable gait 12/09/2020   COPD with acute exacerbation ([place]) 01/23/2020   Peripheral neuropathy 01/22/2020   Diabetes mellitus with complication ([place]) 99991111   Substance abuse ([place]) 01/22/2020   Sciatica 01/22/2020   HLD (hyperlipidemia) 01/22/2020   Noncompliance with medications 01/21/2017   Duodenal ulcer 07/23/2016   Cocaine abuse ([place]) 12/12/2015   Pain medication agreement broken 12/12/2015   Insomnia 10/28/2015   Pain medication agreement signed 07/23/2015   Morbid obesity ([place])  06/26/2015   Chronic back pain 06/23/2015   Rupture of ulnar collateral ligament of thumb 03/01/2014   Memory difficulties 08/15/2013   Diabetic neuropathy associated with type 2 diabetes mellitus ([place]) 08/15/2013   GERD (gastroesophageal reflux disease) 07/19/2013   GAD (generalized anxiety disorder) 07/19/2013   Depression 07/19/2013   Ataxia, late effect of cerebrovascular disease 07/05/2011   Postlaminectomy syndrome, lumbar region 07/05/2011   Hyperlipidemia  associated with type 2 diabetes mellitus ([place]) 12/30/2009   SMOKER 12/30/2009   Colitis, acute 05/27/2008   Hypothyroidism 05/23/2008   Type 2 diabetes mellitus treated with insulin ([place]) 05/23/2008   Hypertension associated with diabetes ([place]) 05/23/2008   History of CVA (cerebrovascular accident) 05/23/2008   FATTY LIVER DISEASE 05/23/2008   ABDOMINAL PAIN 05/23/2008    Darlin Coco, PT 02/25/2021, 2:47 PM  [facility] [address] [place], [place], [address] Phone: [phone]   Fax:  [phone]  Name: ZOLLIE HERRIOTT MRN: [id number] Date of Birth: [birth year]

## 2021-02-25 NOTE — Patient Instructions (Signed)
Hand Pain °Many things can cause hand pain. Some common causes are: °An injury. °Repeating the same movement with your hand over and over (overuse). °Osteoporosis. °Arthritis. °Lumps in the tendons or joints of the hand and wrist (ganglion cysts). °Nerve compression syndromes (carpal tunnel syndrome). °Inflammation of the tendons (tendinitis). °Infection. °Follow these instructions at home: °Pay attention to any changes in your symptoms. Take these actions to help with your discomfort: °Managing pain, stiffness, and swelling ° °Take over-the-counter and prescription medicines only as told by your health care provider. °Wear a hand splint or support as told by your health care provider. °If directed, put ice on the affected area: °Put ice in a plastic bag. °Place a towel between your skin and the bag. °Leave the ice on for 20 minutes, 2-3 times a day. °Activity °Take breaks from repetitive activity often. °Avoid activities that make your pain worse. °Minimize stress on your hands and wrists as much as possible. °Do stretches or exercises as told by your health care provider. °Do not do activities that make your pain worse. °Contact a health care provider if: °Your pain does not get better after a few days of self-care. °Your pain gets worse. °Your pain affects your ability to do your daily activities. °Get help right away if: °Your hand becomes warm, red, or swollen. °Your hand is numb or tingling. °Your hand is extremely swollen or deformed. °Your hand or fingers turn white or blue. °You cannot move your hand, wrist, or fingers. °Summary °Many things can cause hand pain. °Contact your health care provider if your pain does not get better after a few days of self care. °Minimize stress on your hands and wrists as much as possible. °Do not do activities that make your pain worse. °This information is not intended to replace advice given to you by your health care provider. Make sure you discuss any questions you have  with your health care provider. °Document Revised: 05/22/2020 Document Reviewed: 05/22/2020 °Elsevier Patient Education © 2022 [employer] ° °

## 2021-02-25 NOTE — Progress Notes (Signed)
Subjective:    Patient ID: Charles Pennington, male    DOB: Jun 28, 1965, 56 y.o.   MRN: [id number]  Chief Complaint  Patient presents with   Follow-up   Hand Pain    Right hand times 1 mth    Pt presents to the office today with right hand pain that started a month ago. Denies any injury. He is taking Trelegy as needed for his COPD.  Hand Pain  The incident occurred more than 1 week ago. There was no injury mechanism. Pain location: right hand. The quality of the pain is described as aching. The pain radiates to the right hand. The pain is at a severity of 8/10. The pain is mild. The pain has been Intermittent since the incident. Pertinent negatives include no chest pain, muscle weakness, numbness or tingling. Nothing aggravates the symptoms. He has tried rest and NSAIDs for the symptoms. The treatment provided mild relief.  Diabetes He presents for his follow-up diabetic visit. He has type 2 diabetes mellitus. Associated symptoms include foot paresthesias. Pertinent negatives for diabetes include no blurred vision and no chest pain. Symptoms are stable. Diabetic complications include nephropathy and peripheral neuropathy. Risk factors for coronary artery disease include dyslipidemia, diabetes mellitus, hypertension and sedentary lifestyle. He is following a generally unhealthy diet. His overall blood glucose range is 180-200 mg/dl.     Review of Systems  Eyes:  Negative for blurred vision.  Cardiovascular:  Negative for chest pain.  Neurological:  Negative for tingling and numbness.  All other systems reviewed and are negative.     Objective:   Physical Exam Vitals reviewed.  Constitutional:      General: He is not in acute distress.    Appearance: He is well-developed.  HENT:     Head: Normocephalic.     Right Ear: Tympanic membrane normal.     Left Ear: Tympanic membrane normal.  Eyes:     General:        Right eye: No discharge.        Left eye: No discharge.     Pupils:  Pupils are equal, round, and reactive to light.  Neck:     Thyroid: No thyromegaly.  Cardiovascular:     Rate and Rhythm: Normal rate and regular rhythm.     Heart sounds: Normal heart sounds. No murmur heard. Pulmonary:     Effort: Pulmonary effort is normal. No respiratory distress.     Breath sounds: Wheezing present.  Abdominal:     General: Bowel sounds are normal. There is no distension.     Palpations: Abdomen is soft.     Tenderness: There is no abdominal tenderness.  Musculoskeletal:        General: Tenderness present. Normal range of motion.     Cervical back: Normal range of motion and neck supple.     Comments: Pain in hand with flexion and making fist  Skin:    General: Skin is warm and dry.     Findings: No erythema or rash.  Neurological:     Mental Status: He is alert and oriented to person, place, and time.     Cranial Nerves: No cranial nerve deficit.     Deep Tendon Reflexes: Reflexes are normal and symmetric.  Psychiatric:        Behavior: Behavior normal.        Thought Content: Thought content normal.        Judgment: Judgment normal.      BP  107/82    Pulse 85    Temp (!) 97.4 F (36.3 C) (Temporal)    Ht 5\' 8"  (1.727 m)    Wt 206 lb (93.4 kg)    BMI 31.32 kg/m      Assessment & Plan:  Charles Pennington comes in today with chief complaint of Follow-up and Hand Pain (Right hand times 1 mth )   Diagnosis and orders addressed:  1. Right hand pain Continue diclofenac BID with food  No other NSAID's  Start Diclofenac gel  - diclofenac (VOLTAREN) 75 MG EC tablet; Take 1 tablet (75 mg total) by mouth 2 (two) times daily.  Dispense: 60 tablet; Refill: 1 - diclofenac Sodium (VOLTAREN) 1 % GEL; Apply 4 g topically 4 (four) times daily.  Dispense: 350 g; Refill: 2  2. COPD with acute exacerbation (HCC) Smoking cessation discussed  Continue Trelegy daily  3. Type 2 diabetes mellitus treated with insulin (HCC) Continue medications Low carb diet      Health Maintenance reviewed Diet and exercise encouraged  Follow up plan: 1 months    Elbert Ewings, FNP

## 2021-02-25 NOTE — Telephone Encounter (Signed)
Will close encounter

## 2021-02-28 ENCOUNTER — Other Ambulatory Visit: Payer: Self-pay | Admitting: Family

## 2021-03-04 ENCOUNTER — Other Ambulatory Visit: Payer: Self-pay | Admitting: Family

## 2021-03-04 ENCOUNTER — Other Ambulatory Visit: Payer: Self-pay

## 2021-03-04 ENCOUNTER — Ambulatory Visit: Payer: Medicare PPO | Admitting: Physical Therapy

## 2021-03-04 DIAGNOSIS — M5441 Lumbago with sciatica, right side: Secondary | ICD-10-CM

## 2021-03-04 DIAGNOSIS — R293 Abnormal posture: Secondary | ICD-10-CM

## 2021-03-04 DIAGNOSIS — M5440 Lumbago with sciatica, unspecified side: Secondary | ICD-10-CM | POA: Diagnosis not present

## 2021-03-04 DIAGNOSIS — G8929 Other chronic pain: Secondary | ICD-10-CM | POA: Diagnosis not present

## 2021-03-04 NOTE — Therapy (Addendum)
[facility] [place], [place], [address] Phone: [phone]   Fax:  [phone]  Physical Therapy Treatment  Patient Details  Name: Charles Pennington MRN: [id number] Date of Birth: [birth year] Referring Provider (PT): Evelina Dun   Encounter Date: 03/04/2021   PT End of Session - 03/04/21 1158     Visit Number 3    Number of Visits 4    Date for PT Re-Evaluation 03/19/21    PT Start Time 1120    PT Stop Time 1207    PT Time Calculation (min) 47 min    Activity Tolerance Patient tolerated treatment well    Behavior During Therapy [facility] for tasks assessed/performed             Past Medical History:  Diagnosis Date   Anxiety    Asthma    Diabetes mellitus    Diabetes mellitus without complication ([place])    Hypercholesteremia    Hypertension    Hypothyroidism    Low back pain    Obesity    SOB (shortness of breath)    Stroke [facility]) age 13   Stroke [facility])    Vertigo     Past Surgical History:  Procedure Laterality Date   BACK SURGERY  2013   COLONOSCOPY  2009   Inflammatory changes of the cecum and ascending colon most consistent with infectious etiology, NSAID, ischemia. Suspected resolving infection based on symptomatology.   FOOT SURGERY     Dr Irving Shows   SHOULDER ARTHROSCOPY Right    SPINE SURGERY     WRIST SURGERY      There were no vitals filed for this visit.   Subjective Assessment - 03/04/21 1159     Subjective COVID-19 screen performed prior to patient entering clinic.  About the same.    Pertinent History DM, HTN, stroke, PVXY(8016), shoulder, wrist and foot surgery.  Peripheral neuropathy.    How long can you stand comfortably? Less than 5 minutes.    How long can you walk comfortably? Very limited community distances.    Patient Stated Goals Have surgery and walk better.    Currently in Pain? Yes    Pain Score 9     Pain Orientation Right;Posterior    Pain Descriptors / Indicators  Aching;Sharp    Pain Type Chronic pain    Pain Onset More than a month ago                               [facility] Adult PT Treatment/Exercise - 03/04/21 0001       Exercises   Exercises Knee/Hip      Lumbar Exercises: Aerobic   Nustep Level 3 x 6 minutes.      Modalities   Modalities Electrical Stimulation;Moist Heat      Moist Heat Therapy   Number Minutes Moist Heat 15 Minutes    Moist Heat Location Lumbar Spine      Electrical Stimulation   Electrical Stimulation Location Right lower lumbar/upper gluteal region.    Electrical Stimulation Action IFC at 80-150 Hz.    Electrical Stimulation Parameters 40% scan x 15 minutes.    Electrical Stimulation Goals Tone;Pain      Manual Therapy   Manual Therapy Soft tissue mobilization    Soft tissue mobilization Left sdly position with folded pillow between knees for comfort:  STW/M x 17 minutes to patient's right lumbar and upper gluteal musculature.  PT Long Term Goals - 02/19/21 1249       PT LONG TERM GOAL #1   Title Independent with a HEP.    Time 4    Period Weeks    Status New      PT LONG TERM GOAL #2   Title Stand 20 minutes with pain not > 5/10.    Baseline Pain to 10/10 within 5 minutes of standing.    Time 4    Period Weeks    Status New      PT LONG TERM GOAL #3   Title Ambulate 500 feet with walking stick with  low back pain-level not > 5/10.    Time 4    Period Weeks    Status New                   Plan - 03/04/21 1228     Clinical Impression Statement Patient did well with treatment today and felt better following.  He continues to report high low back pain-levels and pain shooting into right buttock.    Personal Factors and Comorbidities Comorbidity 1;Comorbidity 3+;Comorbidity 2    Comorbidities DM, HTN, stroke, Back, shoulder, wrist and foot surgery, peripheral neuropathy.    Examination-Activity Limitations Locomotion  Level;Other;Sit;Stand    Examination-Participation Restrictions Other    Stability/Clinical Decision Making Evolving/Moderate complexity    Rehab Potential Fair    PT Frequency 1x / week    PT Duration 4 weeks    PT Treatment/Interventions ADLs/Self Care Home Management;Cryotherapy;Electrical Stimulation;Ultrasound;Moist Heat;Therapeutic activities;Therapeutic exercise;Manual techniques;Patient/family education;Passive range of motion    PT Next Visit Plan 1 visit remaining.             Patient will benefit from skilled therapeutic intervention in order to improve the following deficits and impairments:  Abnormal gait, Increased muscle spasms, Decreased activity tolerance, Pain, Postural dysfunction, Decreased range of motion  Visit Diagnosis: Abnormal posture  Chronic right-sided low back pain with right-sided sciatica     Problem List Patient Active Problem List   Diagnosis Date Noted   Unstable gait 12/09/2020   COPD with acute exacerbation ([place]) 01/23/2020   Peripheral neuropathy 01/22/2020   Diabetes mellitus with complication ([place]) 95/10/3265   Substance abuse ([place]) 01/22/2020   Sciatica 01/22/2020   HLD (hyperlipidemia) 01/22/2020   Noncompliance with medications 01/21/2017   Duodenal ulcer 07/23/2016   Cocaine abuse ([place]) 12/12/2015   Pain medication agreement broken 12/12/2015   Insomnia 10/28/2015   Pain medication agreement signed 07/23/2015   Morbid obesity ([place]) 06/26/2015   Chronic back pain 06/23/2015   Rupture of ulnar collateral ligament of thumb 03/01/2014   Memory difficulties 08/15/2013   Diabetic neuropathy associated with type 2 diabetes mellitus ([place]) 08/15/2013   GERD (gastroesophageal reflux disease) 07/19/2013   GAD (generalized anxiety disorder) 07/19/2013   Depression 07/19/2013   Ataxia, late effect of cerebrovascular disease 07/05/2011   Postlaminectomy syndrome, lumbar region 07/05/2011   Hyperlipidemia associated with type 2 diabetes  mellitus ([place]) 12/30/2009   SMOKER 12/30/2009   Colitis, acute 05/27/2008   Hypothyroidism 05/23/2008   Type 2 diabetes mellitus treated with insulin ([place]) 05/23/2008   Hypertension associated with diabetes ([place]) 05/23/2008   History of CVA (cerebrovascular accident) 05/23/2008   FATTY LIVER DISEASE 05/23/2008   ABDOMINAL PAIN 05/23/2008    Abbigaile Rockman, [place], PT 03/04/2021, 12:31 PM  [facility] [address] [place], [place], [address] Phone: [phone]   Fax:  [phone]  Name: Charles Pennington  MRN: [id number] Date of Birth: 1966/02/07  PHYSICAL THERAPY DISCHARGE SUMMARY  Visits from Start of Care: 3.  Current functional level related to goals / functional outcomes: See above.   Remaining deficits: Continued pain.   Education / Equipment: HEP.   Patient agrees to discharge. Patient goals were not met. Patient is being discharged due to not returning since the last visit.    [place]ali Aaleeyah Bias MPT

## 2021-03-06 ENCOUNTER — Other Ambulatory Visit: Payer: Self-pay | Admitting: Family

## 2021-03-06 DIAGNOSIS — K219 Gastro-esophageal reflux disease without esophagitis: Secondary | ICD-10-CM

## 2021-03-06 DIAGNOSIS — G4700 Insomnia, unspecified: Secondary | ICD-10-CM

## 2021-03-10 ENCOUNTER — Telehealth: Payer: Self-pay | Admitting: Family

## 2021-03-10 DIAGNOSIS — G4700 Insomnia, unspecified: Secondary | ICD-10-CM

## 2021-03-10 MED ORDER — ZOLPIDEM TARTRATE 10 MG PO TABS: 10.0000 mg | ORAL_TABLET | Freq: Every day | ORAL | 2 refills | Status: DC

## 2021-03-10 NOTE — Telephone Encounter (Signed)
I scheduled him 03/12/21 at 11:55. He is completely out of his Remus Loffler, can a 2 day supply be sent in or does he need to wait?

## 2021-03-10 NOTE — Telephone Encounter (Signed)
PT has been seen multiple times, I will refill without an appointment. He should keep his follow up to discuss his other chronic follow up.

## 2021-03-10 NOTE — Telephone Encounter (Signed)
VM box not set up across

## 2021-03-10 NOTE — Telephone Encounter (Signed)
Pt wants to know if Neysa Bonito can work him in today to be seen so that he can get refills on his Ambien Rx because he is completely out.

## 2021-03-11 ENCOUNTER — Ambulatory Visit: Payer: Medicare PPO | Admitting: Physical Therapy

## 2021-03-12 ENCOUNTER — Ambulatory Visit (INDEPENDENT_AMBULATORY_CARE_PROVIDER_SITE_OTHER): Payer: Medicare PPO | Admitting: Family

## 2021-03-12 ENCOUNTER — Encounter: Payer: Self-pay | Admitting: Family

## 2021-03-12 VITALS — BP 132/88 | HR 85 | Temp 97.7°F | Ht 68.0 in | Wt 205.0 lb

## 2021-03-12 DIAGNOSIS — F331 Major depressive disorder, recurrent, moderate: Secondary | ICD-10-CM

## 2021-03-12 DIAGNOSIS — E118 Type 2 diabetes mellitus with unspecified complications: Secondary | ICD-10-CM | POA: Diagnosis not present

## 2021-03-12 DIAGNOSIS — E669 Obesity, unspecified: Secondary | ICD-10-CM

## 2021-03-12 DIAGNOSIS — E1169 Type 2 diabetes mellitus with other specified complication: Secondary | ICD-10-CM | POA: Diagnosis not present

## 2021-03-12 DIAGNOSIS — G4700 Insomnia, unspecified: Secondary | ICD-10-CM

## 2021-03-12 DIAGNOSIS — K219 Gastro-esophageal reflux disease without esophagitis: Secondary | ICD-10-CM

## 2021-03-12 DIAGNOSIS — E1142 Type 2 diabetes mellitus with diabetic polyneuropathy: Secondary | ICD-10-CM | POA: Diagnosis not present

## 2021-03-12 DIAGNOSIS — E1159 Type 2 diabetes mellitus with other circulatory complications: Secondary | ICD-10-CM

## 2021-03-12 DIAGNOSIS — J441 Chronic obstructive pulmonary disease with (acute) exacerbation: Secondary | ICD-10-CM | POA: Diagnosis not present

## 2021-03-12 DIAGNOSIS — I152 Hypertension secondary to endocrine disorders: Secondary | ICD-10-CM

## 2021-03-12 DIAGNOSIS — F1910 Other psychoactive substance abuse, uncomplicated: Secondary | ICD-10-CM

## 2021-03-12 DIAGNOSIS — E785 Hyperlipidemia, unspecified: Secondary | ICD-10-CM | POA: Diagnosis not present

## 2021-03-12 DIAGNOSIS — E039 Hypothyroidism, unspecified: Secondary | ICD-10-CM | POA: Diagnosis not present

## 2021-03-12 DIAGNOSIS — F411 Generalized anxiety disorder: Secondary | ICD-10-CM

## 2021-03-12 DIAGNOSIS — Z8673 Personal history of transient ischemic attack (TIA), and cerebral infarction without residual deficits: Secondary | ICD-10-CM

## 2021-03-12 NOTE — Progress Notes (Signed)
Subjective:    Patient ID: Charles Pennington, male    DOB: [birth year], 56 y.o.   MRN: [id number]  Chief Complaint  Patient presents with   Medical Management of Chronic Issues   PT presents to the office today with chronic follow up. He is noncompliant with his medications.    He is followed by Neurosurgeon for chronic back pain. He is suppose to have back surgery, but has not been able to get his blood glucose stable. He has an appointment with Pain Clinic next week.   He has COPD and continues to smoke a pack a day. Has SOB with exertion.   He has a hx of substance abuse and admits to taking adderall at times.  Hypertension This is a chronic problem. The current episode started more than 1 year ago. The problem is controlled. Associated symptoms include anxiety, blurred vision and malaise/fatigue. Pertinent negatives include no peripheral edema or shortness of breath. Risk factors for coronary artery disease include dyslipidemia, diabetes mellitus, obesity, male gender, sedentary lifestyle and smoking/tobacco exposure. The current treatment provides moderate improvement. Hypertensive end-organ damage includes kidney disease and CAD/MI. There is no history of heart failure. Identifiable causes of hypertension include a thyroid problem.  Gastroesophageal Reflux He complains of belching, heartburn and a hoarse voice. This is a chronic problem. The current episode started more than 1 year ago. The problem occurs occasionally. The problem has been waxing and waning. Associated symptoms include fatigue. Risk factors include obesity. He has tried a PPI for the symptoms. The treatment provided moderate relief.  Diabetes He presents for his follow-up diabetic visit. He has type 2 diabetes mellitus. Hypoglycemia symptoms include nervousness/anxiousness. Associated symptoms include blurred vision, fatigue and foot paresthesias. Symptoms are stable. Diabetic complications include heart disease,  nephropathy and peripheral neuropathy. Risk factors for coronary artery disease include dyslipidemia, diabetes mellitus, male sex, hypertension and sedentary lifestyle. (Not checking BS at home) Eye exam is not current.  Hyperlipidemia This is a chronic problem. The current episode started more than 1 year ago. Exacerbating diseases include obesity. Pertinent negatives include no shortness of breath. Current antihyperlipidemic treatment includes statins. The current treatment provides moderate improvement of lipids. Risk factors for coronary artery disease include dyslipidemia, diabetes mellitus, male sex, hypertension and a sedentary lifestyle.  Anxiety Presents for follow-up visit. Symptoms include depressed mood, excessive worry, irritability, nervous/anxious behavior and restlessness. Patient reports no shortness of breath. Symptoms occur most days. The severity of symptoms is moderate.    Depression        This is a chronic problem.  The current episode started more than 1 year ago.   Associated symptoms include fatigue, helplessness, hopelessness, irritable, restlessness and sad.  Past medical history includes thyroid problem and anxiety.   Thyroid Problem Presents for follow-up visit. Symptoms include anxiety, depressed mood, fatigue and hoarse voice. The symptoms have been stable. His past medical history is significant for hyperlipidemia. There is no history of heart failure.     Review of Systems  Constitutional:  Positive for fatigue, irritability and malaise/fatigue.  HENT:  Positive for hoarse voice.   Eyes:  Positive for blurred vision.  Respiratory:  Negative for shortness of breath.   Gastrointestinal:  Positive for heartburn.  Psychiatric/Behavioral:  Positive for depression. The patient is nervous/anxious.   All other systems reviewed and are negative.     Objective:   Physical Exam Vitals reviewed.  Constitutional:      General: [name]. He is not  in acute  distress.    Appearance: He is well-developed.  HENT:     Head: Normocephalic.     Right Ear: Tympanic membrane normal.     Left Ear: Tympanic membrane normal.  Eyes:     General:        Right eye: No discharge.        Left eye: No discharge.     Pupils: Pupils are equal, round, and reactive to light.  Neck:     Thyroid: No thyromegaly.  Cardiovascular:     Rate and Rhythm: Normal rate and regular rhythm.     Heart sounds: Normal heart sounds. No murmur heard. Pulmonary:     Effort: Pulmonary effort is normal. No respiratory distress.     Breath sounds: Normal breath sounds. No wheezing.  Abdominal:     General: Bowel sounds are normal. There is no distension.     Palpations: Abdomen is soft.     Tenderness: There is no abdominal tenderness.  Musculoskeletal:        General: No tenderness. Normal range of motion.     Cervical back: Normal range of motion and neck supple.  Skin:    General: Skin is warm and dry.     Findings: No erythema or rash.  Neurological:     Mental Status: He is alert and oriented to person, place, and time.     Cranial Nerves: No cranial nerve deficit.     Deep Tendon Reflexes: Reflexes are normal and symmetric.  Psychiatric:        Mood and Affect: Affect is inappropriate.        Behavior: Behavior is hyperactive.        Thought Content: Thought content normal.        Judgment: Judgment normal.      BP 132/88    Pulse 85    Temp 97.7 F (36.5 C) (Temporal)    Ht 5' 8"  (1.727 m)    Wt 205 lb (93 kg)    BMI 31.17 kg/m      Assessment & Plan:  DOMANIQUE LUCKETT comes in today with chief complaint of Medical Management of Chronic Issues   Diagnosis and orders addressed:  1. Hypertension associated with diabetes ([place]) - CMP14+EGFR - CBC with Differential/Platelet  2. COPD with acute exacerbation (HCC) - CMP14+EGFR - CBC with Differential/Platelet  3. Gastroesophageal reflux disease, unspecified whether esophagitis present -  CMP14+EGFR - CBC with Differential/Platelet  4. Diabetes mellitus with complication (HCC) - OTL57+WIOM - CBC with Differential/Platelet  5. Diabetic polyneuropathy associated with type 2 diabetes mellitus (HCC) - CMP14+EGFR - CBC with Differential/Platelet  6. Hyperlipidemia associated with type 2 diabetes mellitus (HCC)  - CMP14+EGFR - CBC with Differential/Platelet  7. Hypothyroidism, unspecified type - CMP14+EGFR - CBC with Differential/Platelet - TSH  8. Moderate episode of recurrent major depressive disorder (HCC)  - CMP14+EGFR - CBC with Differential/Platelet  9. GAD (generalized anxiety disorder) - CMP14+EGFR - CBC with Differential/Platelet  10. History of CVA (cerebrovascular accident) - CMP14+EGFR - CBC with Differential/Platelet  11. Hyperlipidemia, unspecified hyperlipidemia type - CMP14+EGFR - CBC with Differential/Platelet  12. Insomnia, unspecified type - CMP14+EGFR - CBC with Differential/Platelet  13. Obesity (BMI 30-39.9) - CMP14+EGFR - CBC with Differential/Platelet  14. Substance abuse ([place]) - CMP14+EGFR - CBC with Differential/Platelet   Labs pending Health Maintenance reviewed Diet and exercise encouraged  Follow up plan: 3 months    Evelina Dun, FNP

## 2021-03-12 NOTE — Patient Instructions (Signed)

## 2021-03-13 LAB — CBC WITH DIFFERENTIAL/PLATELET
Basophils Absolute: 0.1 10*3/uL (ref 0.0–0.2)
Basos: 1 %
EOS (ABSOLUTE): 0.4 10*3/uL (ref 0.0–0.4)
Eos: 5 %
Hematocrit: 49.1 % (ref 37.5–51.0)
Hemoglobin: 16.7 g/dL (ref 13.0–17.7)
Immature Grans (Abs): 0 10*3/uL (ref 0.0–0.1)
Immature Granulocytes: 0 %
Lymphocytes Absolute: 2.7 10*3/uL (ref 0.7–3.1)
Lymphs: 38 %
MCH: 32 pg (ref 26.6–33.0)
MCHC: 34 g/dL (ref 31.5–35.7)
MCV: 94 fL (ref 79–97)
Monocytes Absolute: 0.3 10*3/uL (ref 0.1–0.9)
Monocytes: 5 %
Neutrophils Absolute: 3.6 10*3/uL (ref 1.4–7.0)
Neutrophils: 51 %
Platelets: 210 10*3/uL (ref 150–450)
RBC: 5.22 x10E6/uL (ref 4.14–5.80)
RDW: 12.4 % (ref 11.6–15.4)
WBC: 7.2 10*3/uL (ref 3.4–10.8)

## 2021-03-13 LAB — CMP14+EGFR
ALT: 23 IU/L (ref 0–44)
AST: 16 IU/L (ref 0–40)
Albumin/Globulin Ratio: 2 (ref 1.2–2.2)
Albumin: 4.2 g/dL (ref 3.8–4.9)
Alkaline Phosphatase: 90 IU/L (ref 44–121)
BUN/Creatinine Ratio: 22 — ABNORMAL HIGH (ref 9–20)
BUN: 17 mg/dL (ref 6–24)
Bilirubin Total: 0.4 mg/dL (ref 0.0–1.2)
CO2: 28 mmol/L (ref 20–29)
Calcium: 9.7 mg/dL (ref 8.7–10.2)
Chloride: 94 mmol/L — ABNORMAL LOW (ref 96–106)
Creatinine, Ser: 0.79 mg/dL (ref 0.76–1.27)
Globulin, Total: 2.1 g/dL (ref 1.5–4.5)
Glucose: 408 mg/dL (ref 70–99)
Potassium: 4.3 mmol/L (ref 3.5–5.2)
Sodium: 136 mmol/L (ref 134–144)
Total Protein: 6.3 g/dL (ref 6.0–8.5)
eGFR: 106 mL/min/{1.73_m2} (ref >59)

## 2021-03-13 LAB — TSH: TSH: 0.601 u[IU]/mL (ref 0.450–4.500)

## 2021-03-17 ENCOUNTER — Other Ambulatory Visit: Payer: Self-pay | Admitting: Family

## 2021-03-17 DIAGNOSIS — M545 Low back pain, unspecified: Secondary | ICD-10-CM | POA: Diagnosis not present

## 2021-03-17 DIAGNOSIS — M79604 Pain in right leg: Secondary | ICD-10-CM | POA: Diagnosis not present

## 2021-03-17 DIAGNOSIS — M5431 Sciatica, right side: Secondary | ICD-10-CM | POA: Diagnosis not present

## 2021-03-17 DIAGNOSIS — M25511 Pain in right shoulder: Secondary | ICD-10-CM | POA: Diagnosis not present

## 2021-03-17 DIAGNOSIS — Z79891 Long term (current) use of opiate analgesic: Secondary | ICD-10-CM | POA: Diagnosis not present

## 2021-03-26 ENCOUNTER — Other Ambulatory Visit: Payer: Self-pay | Admitting: Family

## 2021-03-26 DIAGNOSIS — J209 Acute bronchitis, unspecified: Secondary | ICD-10-CM

## 2021-03-26 DIAGNOSIS — R062 Wheezing: Secondary | ICD-10-CM

## 2021-03-27 ENCOUNTER — Ambulatory Visit: Payer: Self-pay | Admitting: Orthopedic Surgery

## 2021-03-27 DIAGNOSIS — Z01818 Encounter for other preprocedural examination: Secondary | ICD-10-CM

## 2021-03-30 ENCOUNTER — Ambulatory Visit: Payer: Medicare PPO | Admitting: Family

## 2021-03-30 ENCOUNTER — Encounter: Payer: Self-pay | Admitting: Family

## 2021-03-30 NOTE — Progress Notes (Unsigned)
Primary Care Physician:  Pennington, Charles A, FNP ° °Primary Gastroenterologist:   ° °No chief complaint on file. ° ° °HPI:  Charles Pennington is a 55 y.o. male here at the request of Charles Pennington for consideration of a colonoscopy. He complained of hernia and constipation.  ° ° ° °Patient has back surgery scheduled for 04/16/2021.  ° ° ° °Last colonoscopy 2009: patchy erythema with erosions and ulcerations in the proximal ascending colon as well as cecum. Normal TI. Biopsies most c/w ischemic injury vs NSAIDs vs infectious etiology.  ° °Current Outpatient Medications  °Medication Sig Dispense Refill  ° albuterol (PROVENTIL) (2.5 MG/3ML) 0.083% nebulizer solution NEBULIZE 1 VIAL EVERY 6 HOURS AS NEEDED FOR WHEEZING OR SHORTNESS OF BREATH 180 mL 0  ° albuterol (VENTOLIN HFA) 108 (90 Base) MCG/ACT inhaler INHALE 2 PUFFS INTO THE LUNGS EVERY 6 (SIX) HOURS AS NEEDED FOR WHEEZING. 8.5 g 0  ° atorvastatin (LIPITOR) 80 MG tablet TAKE 1 TABLET DAILY 90 tablet 1  ° blood glucose meter kit and supplies Dispense based on patient and insurance preference. Test sugar BID and as needed 1 each 0  ° CONTOUR NEXT TEST test strip CHECK BLOOD SUGAR UP TO TWICE DAILY 100 strip 3  ° dapagliflozin propanediol (FARXIGA) 10 MG TABS tablet Take 1 tablet (10 mg total) by mouth daily. 90 tablet 3  ° diclofenac (VOLTAREN) 75 MG EC tablet Take 1 tablet (75 mg total) by mouth 2 (two) times daily. 60 tablet 1  ° diclofenac Sodium (VOLTAREN) 1 % GEL Apply 4 g topically 4 (four) times daily. 350 g 2  ° insulin degludec (TRESIBA FLEXTOUCH) 200 UNIT/ML FlexTouch Pen Inject 26 Units into the skin at bedtime. 18 mL 0  ° Insulin Pen Needle (ULTICARE MICRO PEN NEEDLES) 32G X 4 MM MISC USE AS DIRECTED TWICE A DAY 100 each 3  ° levothyroxine (SYNTHROID) 200 MCG tablet Take 1 tablet (200 mcg total) by mouth daily. 90 tablet 2  ° lisinopril (ZESTRIL) 10 MG tablet Take 1 tablet (10 mg total) by mouth daily. 90 tablet 3  ° metFORMIN (GLUCOPHAGE-XR) 750 MG 24 hr  tablet TAKE 2 TABLETS DAILY WITH MORNING MEAL 180 tablet 0  ° omeprazole (PRILOSEC) 20 MG capsule TAKE (1) CAPSULE DAILY 90 capsule 0  ° PARoxetine (PAXIL) 40 MG tablet TAKE 1 TABLET DAILY 90 tablet 0  ° pregabalin (LYRICA) 150 MG capsule TAKE UP TO 2 CAPSULES EVERY MORNING & 1 CAPSULE EVERY EVENING 108 capsule 2  ° TRELEGY ELLIPTA 100-62.5-25 MCG/ACT AEPB INHALE 1 ONCE A DAY AS DIRECTED 60 each 0  ° TRULICITY 3 MG/0.5ML SOPN INJECT 3MG (0.5ML) ONCE WEEKLY AS DIRECTED 6 mL 0  ° zolpidem (AMBIEN) 10 MG tablet Take 1 tablet (10 mg total) by mouth at bedtime. 30 tablet 2  ° °No current facility-administered medications for this visit.  ° ° °Allergies as of 03/31/2021 - Review Complete 03/12/2021  °Allergen Reaction Noted  ° Hydrocodone-acetaminophen Itching and Other (See Comments) 07/29/2011  ° Gadolinium derivatives Nausea And Vomiting 03/09/2018  ° ° °Past Medical History:  °Diagnosis Date  ° Anxiety   ° Asthma   ° Diabetes mellitus   ° Diabetes mellitus without complication (HCC)   ° Hypercholesteremia   ° Hypertension   ° Hypothyroidism   ° Low back pain   ° Obesity   ° SOB (shortness of breath)   ° Stroke (HCC) age 31  ° Stroke (HCC)   ° Vertigo   ° ° °Past Surgical   History:  Procedure Laterality Date   BACK SURGERY  2013   COLONOSCOPY  2009   Inflammatory changes of the cecum and ascending colon most consistent with infectious etiology, NSAID, ischemia. Suspected resolving infection based on symptomatology.   FOOT SURGERY     Dr Irving Shows   SHOULDER ARTHROSCOPY Right    SPINE SURGERY     WRIST SURGERY      Family History  Problem Relation Age of Onset   Hypertension Father    Suicidality Father    Diabetes Father    Diabetes Mother    Colon cancer Neg Hx    Inflammatory bowel disease Neg Hx     Social History   Socioeconomic History   Marital status: Single    Spouse name: Not on file   Number of children: 2   Years of education: Not on file   Highest education level: 8th grade   Occupational History   Occupation: disabled Clinical biochemist  Tobacco Use   Smoking status: Every Day    Packs/day: 1.00    Years: 30.00    Pack years: 30.00    Types: Cigarettes   Smokeless tobacco: Never  Vaping Use   Vaping Use: Never used  Substance and Sexual Activity   Alcohol use: Never   Drug use: No   Sexual activity: Yes  Other Topics Concern   Not on file  Social History Narrative   ** Merged History Encounter **    His mom lives with him. Children live nearby   Social Determinants of Health   Financial Resource Strain: High Risk   Difficulty of Paying Living Expenses: Hard  Food Insecurity: Food Insecurity Present   Worried About Charity fundraiser in the Last Year: Often true   Arboriculturist in the Last Year: Sometimes true  Transportation Needs: Not on file  Physical Activity: Inactive   Days of Exercise per Week: 0 days   Minutes of Exercise per Session: 0 min  Stress: No Stress Concern Present   Feeling of Stress : Only a little  Social Connections: Socially Isolated   Frequency of Communication with Friends and Family: More than three times a week   Frequency of Social Gatherings with Friends and Family: More than three times a week   Attends Religious Services: Never   Marine scientist or Organizations: No   Attends Music therapist: Never   Marital Status: Never married  Human resources officer Violence: Not At Risk   Fear of Current or Ex-Partner: No   Emotionally Abused: No   Physically Abused: No   Sexually Abused: No      ROS:  General: Negative for anorexia, weight loss, fever, chills, fatigue, weakness. Eyes: Negative for vision changes.  ENT: Negative for hoarseness, difficulty swallowing , nasal congestion. CV: Negative for chest pain, angina, palpitations, dyspnea on exertion, peripheral edema.  Respiratory: Negative for dyspnea at rest, dyspnea on exertion, cough, sputum, wheezing.  GI: See history of present  illness. GU:  Negative for dysuria, hematuria, urinary incontinence, urinary frequency, nocturnal urination.  MS: Negative for joint pain, low back pain.  Derm: Negative for rash or itching.  Neuro: Negative for weakness, abnormal sensation, seizure, frequent headaches, memory loss, confusion.  Psych: Negative for anxiety, depression, suicidal ideation, hallucinations.  Endo: Negative for unusual weight change.  Heme: Negative for bruising or bleeding. Allergy: Negative for rash or hives.    Physical Examination:  There were no vitals taken for this visit.  General: Well-nourished, well-developed in no acute distress.  °Head: Normocephalic, atraumatic.   °Eyes: Conjunctiva pink, no icterus. °Mouth: Oropharyngeal mucosa moist and pink , no lesions erythema or exudate. °Neck: Supple without thyromegaly, masses, or lymphadenopathy.  °Lungs: Clear to auscultation bilaterally.  °Heart: Regular rate and rhythm, no murmurs rubs or gallops.  °Abdomen: Bowel sounds are normal, nontender, nondistended, no hepatosplenomegaly or masses, no abdominal bruits or    hernia , no rebound or guarding.   °Rectal: *** °Extremities: No lower extremity edema. No clubbing or deformities.  °Neuro: Alert and oriented x 4 , grossly normal neurologically.  °Skin: Warm and dry, no rash or jaundice.   °Psych: Alert and cooperative, normal mood and affect. ° °Labs: °*** ° °Imaging Studies: °No results found. ° ° °Assessment: ° ° ° ° ° ° °Plan: ° ° °

## 2021-03-31 ENCOUNTER — Ambulatory Visit: Payer: Medicare PPO | Admitting: Gastroenterology

## 2021-03-31 ENCOUNTER — Encounter: Payer: Self-pay | Admitting: Internal Medicine

## 2021-04-06 ENCOUNTER — Ambulatory Visit: Payer: Self-pay | Admitting: Orthopedic Surgery

## 2021-04-06 ENCOUNTER — Other Ambulatory Visit: Payer: Self-pay

## 2021-04-06 ENCOUNTER — Telehealth: Payer: Self-pay | Admitting: Family

## 2021-04-06 NOTE — Telephone Encounter (Signed)
PA would like to speak with you about patient medication is he suppose to be on blood thinner cause it looks like he had hx of stroke. Patient has appointment for next Monday she is just trying to get everything straight from what he told her. Advised you were of today and would be back tomorrow. She is aware we can not pass him to his A1c is down. But really has question about blood thinner and stroke. Do not see on medication list blood thinner.

## 2021-04-06 NOTE — Telephone Encounter (Signed)
Lmtcb.

## 2021-04-06 NOTE — H&P (Signed)
Subjective:   Past medical history: Smoker, history of stroke (no blood thinners), poorly controlled diabetic (Last A1C >10). Previous lumbar fusion L3-4 by Dr. Rolena Infante (XLIF) Chief complaint: Back pain and radicular Right leg pain with trace EHL and anterior tib weakness Scheduled for OLIF L4-5 PSFI  Patient Active Problem List   Diagnosis Date Noted   Unstable gait 12/09/2020   COPD with acute exacerbation ([place]) 01/23/2020   Peripheral neuropathy 01/22/2020   Diabetes mellitus with complication ([place]) 00/17/4944   Substance abuse ([place]) 01/22/2020   Sciatica 01/22/2020   HLD (hyperlipidemia) 01/22/2020   Noncompliance with medications 01/21/2017   Duodenal ulcer 07/23/2016   Cocaine abuse ([place]) 12/12/2015   Pain medication agreement broken 12/12/2015   Insomnia 10/28/2015   Pain medication agreement signed 07/23/2015   Obesity (BMI 30-39.9) 06/26/2015   Chronic back pain 06/23/2015   Rupture of ulnar collateral ligament of thumb 03/01/2014   Memory difficulties 08/15/2013   Diabetic neuropathy associated with type 2 diabetes mellitus ([place]) 08/15/2013   GERD (gastroesophageal reflux disease) 07/19/2013   GAD (generalized anxiety disorder) 07/19/2013   Depression 07/19/2013   Ataxia, late effect of cerebrovascular disease 07/05/2011   Postlaminectomy syndrome, lumbar region 07/05/2011   Hyperlipidemia associated with type 2 diabetes mellitus ([place]) 12/30/2009   SMOKER 12/30/2009   Colitis, acute 05/27/2008   Hypothyroidism 05/23/2008   Type 2 diabetes mellitus treated with insulin ([place]) 05/23/2008   Hypertension associated with diabetes ([place]) 05/23/2008   History of CVA (cerebrovascular accident) 05/23/2008   FATTY LIVER DISEASE 05/23/2008   Past Medical History:  Diagnosis Date   Anxiety    Asthma    Diabetes mellitus    Diabetes mellitus without complication ([place])    Hypercholesteremia    Hypertension    Hypothyroidism    Low back pain    Obesity    SOB (shortness  of breath)    Stroke [facility]) age 56   Stroke [facility])    Vertigo     Past Surgical History:  Procedure Laterality Date   BACK SURGERY  2013   COLONOSCOPY  2009   Inflammatory changes of the cecum and ascending colon most consistent with infectious etiology, NSAID, ischemia. Suspected resolving infection based on symptomatology.   FOOT SURGERY     Dr Irving Shows   SHOULDER ARTHROSCOPY Right    SPINE SURGERY     WRIST SURGERY      Current Outpatient Medications  Medication Sig Dispense Refill Last Dose   albuterol (PROVENTIL) (2.5 MG/3ML) 0.083% nebulizer solution NEBULIZE 1 VIAL EVERY 6 HOURS AS NEEDED FOR WHEEZING OR SHORTNESS OF BREATH 180 mL 0    albuterol (VENTOLIN HFA) 108 (90 Base) MCG/ACT inhaler INHALE 2 PUFFS INTO THE LUNGS EVERY 6 (SIX) HOURS AS NEEDED FOR WHEEZING. 8.5 g 0    atorvastatin (LIPITOR) 80 MG tablet TAKE 1 TABLET DAILY 90 tablet 1    blood glucose meter kit and supplies Dispense based on patient and insurance preference. Test sugar BID and as needed 1 each 0    CONTOUR NEXT TEST test strip CHECK BLOOD SUGAR UP TO TWICE DAILY 100 strip 3    dapagliflozin propanediol (FARXIGA) 10 MG TABS tablet Take 1 tablet (10 mg total) by mouth daily. 90 tablet 3    diclofenac (VOLTAREN) 75 MG EC tablet Take 1 tablet (75 mg total) by mouth 2 (two) times daily. 60 tablet 1    diclofenac Sodium (VOLTAREN) 1 % GEL Apply 4 g topically 4 (four) times daily. 350 g  2    DULoxetine (CYMBALTA) 30 MG capsule Take by mouth.      insulin degludec (TRESIBA FLEXTOUCH) 200 UNIT/ML FlexTouch Pen Inject 26 Units into the skin at bedtime. 18 mL 0    Insulin Pen Needle (ULTICARE MICRO PEN NEEDLES) 32G X 4 MM MISC USE AS DIRECTED TWICE A DAY 100 each 3    levothyroxine (SYNTHROID) 175 MCG tablet Take by mouth.      levothyroxine (SYNTHROID) 200 MCG tablet Take 1 tablet (200 mcg total) by mouth daily. 90 tablet 2    lisinopril (ZESTRIL) 10 MG tablet Take 1 tablet (10 mg total) by mouth daily. 90 tablet 3     metFORMIN (GLUCOPHAGE-XR) 750 MG 24 hr tablet TAKE 2 TABLETS DAILY WITH MORNING MEAL 180 tablet 0    omeprazole (PRILOSEC) 20 MG capsule TAKE (1) CAPSULE DAILY 90 capsule 0    PARoxetine (PAXIL) 40 MG tablet TAKE 1 TABLET DAILY 90 tablet 0    pregabalin (LYRICA) 150 MG capsule TAKE UP TO 2 CAPSULES EVERY MORNING & 1 CAPSULE EVERY EVENING 108 capsule 2    traMADol (ULTRAM) 50 MG tablet Take by mouth.      TRELEGY ELLIPTA 100-62.5-25 MCG/ACT AEPB INHALE 1 ONCE A DAY AS DIRECTED 60 each 0    TRULICITY 3 DE/0.8XK SOPN INJECT 3MG (0.5ML) ONCE WEEKLY AS DIRECTED 6 mL 0    zolpidem (AMBIEN) 10 MG tablet Take 1 tablet (10 mg total) by mouth at bedtime. 30 tablet 2    No current facility-administered medications for this visit.   Allergies  Allergen Reactions   Hydrocodone-Acetaminophen Itching and Other (See Comments)    Pt is able to tolerate with Benadryl.    Gadolinium Derivatives Nausea And Vomiting    Gadavist given on 12/02/2020 without incident.  Patient stated he never had a reaction to Gadolinium before.    Social History   Tobacco Use   Smoking status: Every Day    Packs/day: 1.00    Years: 30.00    Pack years: 30.00    Types: Cigarettes   Smokeless tobacco: Never  Substance Use Topics   Alcohol use: Never    Family History  Problem Relation Age of Onset   Hypertension Father    Suicidality Father    Diabetes Father    Diabetes Mother    Colon cancer Neg Hx    Inflammatory bowel disease Neg Hx     Review of Systems Pertinent items are noted in HPI.  Objective:   Vitals: Ht: 5 ft 8 in 04/06/2021 01:18 pm BP: 98/62 04/06/2021 01:21 pm  Clinical exam: Charles Pennington is a pleasant individual, who appears younger than their stated age. He is alert and orientated 3. No shortness of breath, chest pain.  Heart: Regular rate and rhythm, no rubs, murmurs, or gallops  Lungs: Clear to auscultation bilaterally  Abdomen is soft and non-tender, negative loss of bowel and bladder  control, no rebound tenderness. Bowel sounds 4  Negative: skin lesions abrasions contusions Peripheral pulses: 2+ dorsalis pedis/posterior tibialis pulses bilaterally. Compartment soft and nontender. Gait pattern: Altered gait pattern slightly unstable. Significant back pain with right radicular leg pain causing his altered gait pattern. Assistive devices: Cane Neuro: Trace weakness of his EHL/tibialis anterior right side. Negative straight leg raise test. Remainder of his lower extremity strength is 5/5. Positive numbness and dysesthesias into the L5 and L4 dermatome bilaterally. No clonus, negative Babinski test, negative Hoffman test. Musculoskeletal: Severe low back pain with palpation and range of  motion. Well-healed surgical scars from his prior L3-L5 decompression, and L3-4 XLIF with PSFI. Lumbar x-rays taken today in the office (AP/lateral/spot lateral) were reviewed: Demonstrate degenerative lumbar disc disease at L4-5 with slight listhesis noted. Positive facet arthrosis is noted. Mild degenerative changes at L2-3 and L5-S1. No scoliosis.  Lumbar MRI: completed on 12/25/2020 was reviewed with the patient. It was completed at [facility]; I have independently reviewed the images as well as the radiology report. Prior laminectomies L3-4 and L4-5. Minimal anterior listhesis at L3-4 with mild foraminal narrowing and no central stenosis. Severe foraminal narrowing at L4-5 with slight anterior listhesis. No significant central stenosis.  Assessment:   Charles Pennington returns today for pre-op H&P. His overall quality of life is continued to deteriorate. He is developed adjacent segment foraminal disease and has severe radicular pain. He has had prior lumbar decompression at L4-5 which were successful in managing his central stenosis but unfortunately the foraminal stenosis has progressed. In addition, he has anterior listhesis at L4/5 further contributing to his foraminal disease.  At this point he is  expressed a desire to move forward with surgery to address his pain. I would recommend an OLIF at L4-5 to address the listhesis and indirectly decompress the foramen. This would avoid the need to do a revision decompression through significant scar formation from his prior lumbar surgery at this level. In order to aid in stabilizing the OLIF cage I would do a minimally invasive pedicle screws insertion at L5 and then extend the current L3-4 construct down to the L5 level. I have gone over the surgery in great detail with the patient and all of his questions were addressed.  Plan:   OLIF/XLIF risks, benefits of surgery were reviewed with the patient. These include: infection, bleeding, death, stroke, paralysis, ongoing or worse pain, need for additional surgery, injury to the lumbar plexus resulting in hip flexor weakness and difficulty walking without assistive devices. Adjacent segment degenerative disease, need for additional surgery including fusing other levels, leak of spinal fluid, Nonunion, hardware failure, breakage, or mal-position. Deep venous thrombosis (DVT) requiring additional treatment such as filter, and/or medications. Injury to abdominal contents, loss in bowel and bladder control.  Risks and benefits of spinal fusion: Infection, bleeding, death, stroke, paralysis, ongoing or worse pain, need for additional surgery, nonunion, leak of spinal fluid, adjacent segment degeneration requiring additional fusion surgery, Injury to abdominal vessels that can require anterior surgery to stop bleeding. Malposition of the cage and/or pedicle screws that could require additional surgery. Loss of bowel and bladder control. Postoperative hematoma causing neurologic compression that could require urgent or emergent re-operation.  We do not yet have preoperative medical clearance. Per the patient his last A1c was over 10. I have personally called his primary care office that he does have an appointment  scheduled for Monday with a repeat A1c. We will follow up on this.  He also has appointment with vascular surgeon and PT for LSO brace fitting scheduled.  We have also discussed the post-operative recovery period to include: bathing/showering restrictions, wound healing, activity (and driving) restrictions, medications/pain mangement. He is not currently T any narcotic pain medication. Dr. Rolena Infante did previously prescribed him a short course of tramadol which the patient discontinued due to constipation. The patient is supposed to be seeing a pain management specialist in the near future as well.  We have also discussed post-operative redflags to include: signs and symptoms of postoperative infection, DVT/PE.  Discharge instructions were reviewed with the patient he was  given a copy of the discharge instructions at today's office visit.

## 2021-04-07 ENCOUNTER — Other Ambulatory Visit: Payer: Self-pay

## 2021-04-07 ENCOUNTER — Ambulatory Visit (INDEPENDENT_AMBULATORY_CARE_PROVIDER_SITE_OTHER): Payer: Medicare PPO | Admitting: Vascular Surgery

## 2021-04-07 ENCOUNTER — Encounter: Payer: Self-pay | Admitting: Vascular Surgery

## 2021-04-07 DIAGNOSIS — G8929 Other chronic pain: Secondary | ICD-10-CM | POA: Diagnosis not present

## 2021-04-07 DIAGNOSIS — I6930 Unspecified sequelae of cerebral infarction: Secondary | ICD-10-CM | POA: Diagnosis not present

## 2021-04-07 DIAGNOSIS — M961 Postlaminectomy syndrome, not elsewhere classified: Secondary | ICD-10-CM | POA: Diagnosis not present

## 2021-04-07 DIAGNOSIS — M5431 Sciatica, right side: Secondary | ICD-10-CM | POA: Diagnosis not present

## 2021-04-07 DIAGNOSIS — F1419 Cocaine abuse with unspecified cocaine-induced disorder: Secondary | ICD-10-CM | POA: Diagnosis not present

## 2021-04-07 DIAGNOSIS — F1510 Other stimulant abuse, uncomplicated: Secondary | ICD-10-CM | POA: Diagnosis not present

## 2021-04-07 DIAGNOSIS — M545 Low back pain, unspecified: Secondary | ICD-10-CM | POA: Diagnosis not present

## 2021-04-07 DIAGNOSIS — M79604 Pain in right leg: Secondary | ICD-10-CM | POA: Diagnosis not present

## 2021-04-07 DIAGNOSIS — M25511 Pain in right shoulder: Secondary | ICD-10-CM | POA: Diagnosis not present

## 2021-04-07 DIAGNOSIS — M549 Dorsalgia, unspecified: Secondary | ICD-10-CM | POA: Insufficient documentation

## 2021-04-07 NOTE — Telephone Encounter (Signed)
Ok, patient scheduled for Surgical clearance on Monday.

## 2021-04-07 NOTE — Progress Notes (Signed)
Patient name: Charles Pennington MRN: [id number] DOB: 1965-03-21 Sex: male  REASON FOR CONSULT: Evaluate for abdominal exposure for L4-L5 OLIF  HPI: Charles Pennington is a 56 y.o. male, with history of hypertension, hyperlipidemia, diabetes, and tobacco abuse that presents for evaluation of abdominal exposure at L4-L5 for OLIF.  Patient has had chronic lower back pain for years.  He has previously undergone L3-L4 posterior spinal fixation with lumbar decompression by Dr. Rolena Infante.  States he was doing well until about 8 months ago when he reinjured his back after lifting heavy piece redwood.  He does smoke about a pack a day.  He denies any previous abdominal surgery.  Past Medical History:  Diagnosis Date   Anxiety    Asthma    Diabetes mellitus    Diabetes mellitus without complication ([place])    Hypercholesteremia    Hypertension    Hypothyroidism    Low back pain    Obesity    SOB (shortness of breath)    Stroke [facility]) age 77   Stroke [facility])    Vertigo     Past Surgical History:  Procedure Laterality Date   BACK SURGERY  2013   COLONOSCOPY  2009   Inflammatory changes of the cecum and ascending colon most consistent with infectious etiology, NSAID, ischemia. Suspected resolving infection based on symptomatology.   FOOT SURGERY     Dr Irving Shows   SHOULDER ARTHROSCOPY Right    SPINE SURGERY     WRIST SURGERY      Family History  Problem Relation Age of Onset   Hypertension Father    Suicidality Father    Diabetes Father    Diabetes Mother    Colon cancer Neg Hx    Inflammatory bowel disease Neg Hx     SOCIAL HISTORY: Social History   Socioeconomic History   Marital status: Single    Spouse name: Not on file   Number of children: 2   Years of education: Not on file   Highest education level: 8th grade  Occupational History   Occupation: disabled Clinical biochemist  Tobacco Use   Smoking status: Every Day    Packs/day: 1.00    Years: 30.00    Pack years: 30.00     Types: Cigarettes   Smokeless tobacco: Never  Vaping Use   Vaping Use: Never used  Substance and Sexual Activity   Alcohol use: Never   Drug use: No   Sexual activity: Yes  Other Topics Concern   Not on file  Social History Narrative   ** Merged History Encounter **    His mom lives with him. Children live nearby   Social Determinants of Health   Financial Resource Strain: High Risk   Difficulty of Paying Living Expenses: Hard  Food Insecurity: Food Insecurity Present   Worried About Charity fundraiser in the Last Year: Often true   Arboriculturist in the Last Year: Sometimes true  Transportation Needs: Not on file  Physical Activity: Inactive   Days of Exercise per Week: 0 days   Minutes of Exercise per Session: 0 min  Stress: No Stress Concern Present   Feeling of Stress : Only a little  Social Connections: Socially Isolated   Frequency of Communication with Friends and Family: More than three times a week   Frequency of Social Gatherings with Friends and Family: More than three times a week   Attends Religious Services: Never   Marine scientist or  Organizations: No   Attends Music therapist: Never   Marital Status: Never married  Human resources officer Violence: Not At Risk   Fear of Current or Ex-Partner: No   Emotionally Abused: No   Physically Abused: No   Sexually Abused: No    Allergies  Allergen Reactions   Hydrocodone-Acetaminophen Itching and Other (See Comments)    Pt is able to tolerate with Benadryl.    Gadolinium Derivatives Nausea And Vomiting    Gadavist given on 12/02/2020 without incident.  Patient stated he never had a reaction to Gadolinium before.    Current Outpatient Medications  Medication Sig Dispense Refill   albuterol (PROVENTIL) (2.5 MG/3ML) 0.083% nebulizer solution NEBULIZE 1 VIAL EVERY 6 HOURS AS NEEDED FOR WHEEZING OR SHORTNESS OF BREATH 180 mL 0   albuterol (VENTOLIN HFA) 108 (90 Base) MCG/ACT inhaler INHALE 2 PUFFS  INTO THE LUNGS EVERY 6 (SIX) HOURS AS NEEDED FOR WHEEZING. 8.5 g 0   atorvastatin (LIPITOR) 80 MG tablet TAKE 1 TABLET DAILY 90 tablet 1   blood glucose meter kit and supplies Dispense based on patient and insurance preference. Test sugar BID and as needed 1 each 0   CONTOUR NEXT TEST test strip CHECK BLOOD SUGAR UP TO TWICE DAILY 100 strip 3   dapagliflozin propanediol (FARXIGA) 10 MG TABS tablet Take 1 tablet (10 mg total) by mouth daily. 90 tablet 3   DULoxetine (CYMBALTA) 30 MG capsule Take by mouth.     insulin degludec (TRESIBA FLEXTOUCH) 200 UNIT/ML FlexTouch Pen Inject 26 Units into the skin at bedtime. 18 mL 0   Insulin Pen Needle (ULTICARE MICRO PEN NEEDLES) 32G X 4 MM MISC USE AS DIRECTED TWICE A DAY 100 each 3   levothyroxine (SYNTHROID) 175 MCG tablet Take by mouth.     levothyroxine (SYNTHROID) 200 MCG tablet Take 1 tablet (200 mcg total) by mouth daily. 90 tablet 2   lisinopril (ZESTRIL) 10 MG tablet Take 1 tablet (10 mg total) by mouth daily. 90 tablet 3   metFORMIN (GLUCOPHAGE-XR) 750 MG 24 hr tablet TAKE 2 TABLETS DAILY WITH MORNING MEAL 180 tablet 0   omeprazole (PRILOSEC) 20 MG capsule TAKE (1) CAPSULE DAILY 90 capsule 0   PARoxetine (PAXIL) 40 MG tablet TAKE 1 TABLET DAILY 90 tablet 0   pregabalin (LYRICA) 150 MG capsule TAKE UP TO 2 CAPSULES EVERY MORNING & 1 CAPSULE EVERY EVENING 108 capsule 2   traMADol (ULTRAM) 50 MG tablet Take by mouth.     TRELEGY ELLIPTA 100-62.5-25 MCG/ACT AEPB INHALE 1 ONCE A DAY AS DIRECTED 60 each 0   TRULICITY 3 EH/2.1YY SOPN INJECT 3MG (0.5ML) ONCE WEEKLY AS DIRECTED 6 mL 0   zolpidem (AMBIEN) 10 MG tablet Take 1 tablet (10 mg total) by mouth at bedtime. 30 tablet 2   diclofenac (VOLTAREN) 75 MG EC tablet Take 1 tablet (75 mg total) by mouth 2 (two) times daily. (Patient not taking: Reported on 04/07/2021) 60 tablet 1   diclofenac Sodium (VOLTAREN) 1 % GEL Apply 4 g topically 4 (four) times daily. (Patient not taking: Reported on 04/07/2021) 350 g  2   No current facility-administered medications for this visit.    REVIEW OF SYSTEMS:  [X]  denotes positive finding, [ ]  denotes negative finding Cardiac  Comments:  Chest pain or chest pressure:    Shortness of breath upon exertion:    Short of breath when lying flat:    Irregular heart rhythm:        Vascular  Pain in calf, thigh, or hip brought on by ambulation:    Pain in feet at night that wakes you up from your sleep:     Blood clot in your veins:    Leg swelling:         Pulmonary    Oxygen at home:    Productive cough:     Wheezing:         Neurologic    Sudden weakness in arms or legs:     Sudden numbness in arms or legs:     Sudden onset of difficulty speaking or slurred speech:    Temporary loss of vision in one eye:     Problems with dizziness:         Gastrointestinal    Blood in stool:     Vomited blood:         Genitourinary    Burning when urinating:     Blood in urine:        Psychiatric    Major depression:         Hematologic    Bleeding problems:    Problems with blood clotting too easily:        Skin    Rashes or ulcers:        Constitutional    Fever or chills:      PHYSICAL EXAM: Vitals:   04/07/21 0907  BP: 120/81  Pulse: 76  Resp: 18  Temp: (!) 97.5 F (36.4 C)  TempSrc: Temporal  Weight: 223 lb (101.2 kg)  Height: 5' 7"  (1.702 m)    GENERAL: The patient is a well-nourished male, in no acute distress. The vital signs are documented above. CARDIAC: There is a regular rate and rhythm.  VASCULAR:  Palpable femoral pulses bilaterally Palpable AT pulses bilaterally with his shoes on PULMONARY: No respiratory distress. ABDOMEN: Soft and non-tender. MUSCULOSKELETAL: There are no major deformities or cyanosis. NEUROLOGIC: No focal weakness or paresthesias are detected. SKIN: There are no ulcers or rashes noted. PSYCHIATRIC: The patient has a normal affect.  DATA:   MRI reviewed  04/09/20     Assessment/Plan:  56 year old male with chronic lower back pain that presents for evaluation of abdominal exposure at L4-L5 for OLIF with Dr. Rolena Infante.  He has had a prior lumbar decompression with posterior spinal fixation at L3-L4.  I discussed him being in the right lateral position and then a oblique incision over the left oblique muscles entering the retroperitoneum to mobilize peritoneum and left ureter away from the disc space.  We discussed mobilizing the iliac vessels as well as the left psoas muscle to expose the L4-L5 disc space.  Discussed risk and injury to the above structures.  Look forward to assisting Dr. Rolena Infante.  All questions answered.   Marty Heck, MD [facility] Office: [phone]

## 2021-04-10 ENCOUNTER — Other Ambulatory Visit: Payer: Self-pay

## 2021-04-10 NOTE — Pre-Procedure Instructions (Signed)
Surgical Instructions    Your procedure is scheduled on Thursday, March 11th.  Report to [facility] Main Entrance "A" at 5:30 A.M., then check in with the Admitting office.  Call this number if you have problems the morning of surgery:  [phone]   If you have any questions prior to your surgery date call [phone]: Open Monday-Friday 8am-4pm    Remember:  Do not eat after midnight the night before your surgery  You may drink clear liquids until 4:30 a.m. the morning of your surgery.   Clear liquids allowed are: Water, Non-Citrus Juices (without pulp), Carbonated Beverages, Clear Tea, Black Coffee Only (NO MILK, CREAM OR POWDERED CREAMER of any kind), and Gatorade.    Take these medicines the morning of surgery with A SIP OF WATER  atorvastatin (LIPITOR)  DULoxetine (CYMBALTA) levothyroxine (SYNTHROID)  omeprazole (PRILOSEC) PARoxetine (PAXIL) pregabalin (LYRICA)  TRELEGY ELLIPTA  As needed: albuterol (PROVENTIL)  albuterol (VENTOLIN HFA)   As of today, STOP taking any Aspirin (unless otherwise instructed by your surgeon) Aleve, Naproxen, Ibuprofen, Motrin, Advil, Goody's, BC's, all herbal medications, fish oil, and all vitamins.  WHAT DO I DO ABOUT MY DIABETES MEDICATION?   Do not take dapagliflozin propanediol (FARXIGA) or metFORMIN (GLUCOPHAGE-XR) the morning of surgery.  HOLD dapagliflozin propanediol (FARXIGA) the day before surgery Wednesday, (04/15/21).    THE MORNING OF SURGERY, take 20-25 units of insulin degludec (TRESIBA FLEXTOUCH) insulin.  The day of surgery, do not take other diabetes injectables, including Trulicity (dulaglutide).   HOW TO MANAGE YOUR DIABETES BEFORE AND AFTER SURGERY  Why is it important to control my blood sugar before and after surgery? Improving blood sugar levels before and after surgery helps healing and can limit problems. A way of improving blood sugar control is eating a healthy diet by:  Eating less sugar and  carbohydrates  Increasing activity/exercise  Talking with your doctor about reaching your blood sugar goals High blood sugars (greater than 180 mg/dL) can raise your risk of infections and slow your recovery, so you will need to focus on controlling your diabetes during the weeks before surgery. Make sure that the doctor who takes care of your diabetes knows about your planned surgery including the date and location.  How do I manage my blood sugar before surgery? Check your blood sugar at least 4 times a day, starting 2 days before surgery, to make sure that the level is not too high or low.  Check your blood sugar the morning of your surgery when you wake up and every 2 hours until you get to the Short Stay unit.  If your blood sugar is less than 70 mg/dL, you will need to treat for low blood sugar: Do not take insulin. Treat a low blood sugar (less than 70 mg/dL) with  cup of clear juice (cranberry or apple), 4 glucose tablets, OR glucose gel. Recheck blood sugar in 15 minutes after treatment (to make sure it is greater than 70 mg/dL). If your blood sugar is not greater than 70 mg/dL on recheck, call [phone] for further instructions. Report your blood sugar to the short stay nurse when you get to Short Stay.  If you are admitted to the hospital after surgery: Your blood sugar will be checked by the staff and you will probably be given insulin after surgery (instead of oral diabetes medicines) to make sure you have good blood sugar levels. The goal for blood sugar control after surgery is 80-180 mg/dL.  Do NOT Smoke (Tobacco/Vaping) for 24 hours prior to your procedure.  If you use a CPAP at night, you may bring your mask/headgear for your overnight stay.   Contacts, glasses, piercing's, hearing aid's, dentures or partials may not be worn into surgery, please bring cases for these belongings.    For patients admitted to the hospital, discharge time will be  determined by your treatment team.   Patients discharged the day of surgery will not be allowed to drive home, and someone needs to stay with them for 24 hours.  NO VISITORS WILL BE ALLOWED IN PRE-OP WHERE PATIENTS ARE PREPPED FOR SURGERY.  ONLY 1 SUPPORT PERSON MAY BE PRESENT IN THE WAITING ROOM WHILE YOU ARE IN SURGERY.  IF YOU ARE TO BE ADMITTED, ONCE YOU ARE IN YOUR ROOM YOU WILL BE ALLOWED TWO (2) VISITORS. (1) VISITOR MAY STAY OVERNIGHT BUT MUST ARRIVE TO THE ROOM BY 8pm.  Minor children may have two parents present. Special consideration for safety and communication needs will be reviewed on a case by case basis.   Special instructions:   [place]- Preparing For Surgery  Before surgery, you can play an important role. Because skin is not sterile, your skin needs to be as free of germs as possible. You can reduce the number of germs on your skin by washing with CHG (chlorahexidine gluconate) Soap before surgery.  CHG is an antiseptic cleaner which kills germs and bonds with the skin to continue killing germs even after washing.    Oral Hygiene is also important to reduce your risk of infection.  Remember - BRUSH YOUR TEETH THE MORNING OF SURGERY WITH YOUR REGULAR TOOTHPASTE  Please do not use if you have an allergy to CHG or antibacterial soaps. If your skin becomes reddened/irritated stop using the CHG.  Do not shave (including legs and underarms) for at least 48 hours prior to first CHG shower. It is OK to shave your face.  Please follow these instructions carefully.   Shower the NIGHT BEFORE SURGERY and the MORNING OF SURGERY  If you chose to wash your hair, wash your hair first as usual with your normal shampoo.  After you shampoo, rinse your hair and body thoroughly to remove the shampoo.  Use CHG Soap as you would any other liquid soap. You can apply CHG directly to the skin and wash gently with a scrungie or a clean washcloth.   Apply the CHG Soap to your body ONLY FROM THE  NECK DOWN.  Do not use on open wounds or open sores. Avoid contact with your eyes, ears, mouth and genitals (private parts). Wash Face and genitals (private parts)  with your normal soap.   Wash thoroughly, paying special attention to the area where your surgery will be performed.  Thoroughly rinse your body with warm water from the neck down.  DO NOT shower/wash with your normal soap after using and rinsing off the CHG Soap.  Pat yourself dry with a CLEAN TOWEL.  Wear CLEAN PAJAMAS to bed the night before surgery  Place CLEAN SHEETS on your bed the night before your surgery  DO NOT SLEEP WITH PETS.   Day of Surgery: Shower with CHG soap. Do not wear jewelry. Do not wear lotions, powders, colognes, or deodorant. Men may shave face and neck. Do not bring valuables to the hospital. [facility] is not responsible for any belongings or valuables. Wear Clean/Comfortable clothing the morning of surgery Remember to brush your teeth WITH YOUR REGULAR  TOOTHPASTE.   Please read over the following fact sheets that you were given.   3 days prior to your procedure or After your COVID test   You are not required to quarantine however you are required to wear a well-fitting mask when you are out and around people not in your household. If your mask becomes wet or soiled, replace with a new one.   Wash your hands often with soap and water for 20 seconds or clean your hands with an alcohol-based hand sanitizer that contains at least 60% alcohol.   Do not share personal items.   Notify your provider:  o if you are in close contact with someone who has COVID  o or if you develop a fever of 100.4 or greater, sneezing, cough, sore throat, shortness of breath or body aches. ;a

## 2021-04-13 ENCOUNTER — Telehealth: Payer: Self-pay | Admitting: Family

## 2021-04-13 ENCOUNTER — Other Ambulatory Visit: Payer: Self-pay | Admitting: Family Medicine

## 2021-04-13 ENCOUNTER — Encounter (HOSPITAL_COMMUNITY): Payer: Self-pay

## 2021-04-13 ENCOUNTER — Encounter (HOSPITAL_COMMUNITY)
Admission: RE | Admit: 2021-04-13 | Discharge: 2021-04-13 | Disposition: A | Discharge status: Home / Self Care | Payer: Medicare PPO | Source: Ambulatory Visit | Attending: Orthopedic Surgery | Admitting: Orthopedic Surgery

## 2021-04-13 ENCOUNTER — Other Ambulatory Visit: Payer: Self-pay

## 2021-04-13 ENCOUNTER — Ambulatory Visit (INDEPENDENT_AMBULATORY_CARE_PROVIDER_SITE_OTHER): Payer: Medicare PPO | Admitting: Family

## 2021-04-13 ENCOUNTER — Encounter: Payer: Self-pay | Admitting: Family

## 2021-04-13 ENCOUNTER — Encounter (HOSPITAL_COMMUNITY): Payer: Self-pay | Admitting: Physician Assistant

## 2021-04-13 VITALS — BP 113/73 | HR 102 | Temp 98.2°F | Resp 18 | Ht 68.0 in | Wt 211.5 lb

## 2021-04-13 DIAGNOSIS — G8929 Other chronic pain: Secondary | ICD-10-CM | POA: Diagnosis not present

## 2021-04-13 DIAGNOSIS — E119 Type 2 diabetes mellitus without complications: Secondary | ICD-10-CM

## 2021-04-13 DIAGNOSIS — I152 Hypertension secondary to endocrine disorders: Secondary | ICD-10-CM

## 2021-04-13 DIAGNOSIS — Z9114 Patient's other noncompliance with medication regimen: Secondary | ICD-10-CM

## 2021-04-13 DIAGNOSIS — Z20822 Contact with and (suspected) exposure to covid-19: Secondary | ICD-10-CM | POA: Diagnosis not present

## 2021-04-13 DIAGNOSIS — Z01818 Encounter for other preprocedural examination: Secondary | ICD-10-CM

## 2021-04-13 DIAGNOSIS — Z794 Long term (current) use of insulin: Secondary | ICD-10-CM | POA: Diagnosis not present

## 2021-04-13 DIAGNOSIS — E1159 Type 2 diabetes mellitus with other circulatory complications: Secondary | ICD-10-CM | POA: Diagnosis not present

## 2021-04-13 DIAGNOSIS — Z01812 Encounter for preprocedural laboratory examination: Secondary | ICD-10-CM | POA: Diagnosis not present

## 2021-04-13 DIAGNOSIS — M5440 Lumbago with sciatica, unspecified side: Secondary | ICD-10-CM | POA: Diagnosis not present

## 2021-04-13 DIAGNOSIS — E118 Type 2 diabetes mellitus with unspecified complications: Secondary | ICD-10-CM | POA: Diagnosis not present

## 2021-04-13 DIAGNOSIS — I2510 Atherosclerotic heart disease of native coronary artery without angina pectoris: Secondary | ICD-10-CM

## 2021-04-13 LAB — BASIC METABOLIC PANEL
Anion gap: 10 (ref 5–15)
BUN: 24 mg/dL — ABNORMAL HIGH (ref 6–20)
CO2: 28 mmol/L (ref 22–32)
Calcium: 9.1 mg/dL (ref 8.9–10.3)
Chloride: 97 mmol/L — ABNORMAL LOW (ref 98–111)
Creatinine, Ser: 0.87 mg/dL (ref 0.61–1.24)
GFR, Estimated: >60 mL/min (ref >60)
Glucose, Bld: 155 mg/dL — ABNORMAL HIGH (ref 70–99)
Potassium: 4.4 mmol/L (ref 3.5–5.1)
Sodium: 135 mmol/L (ref 135–145)

## 2021-04-13 LAB — GLUCOSE, CAPILLARY: Glucose-Capillary: 159 mg/dL — ABNORMAL HIGH (ref 70–99)

## 2021-04-13 LAB — URINALYSIS, ROUTINE W REFLEX MICROSCOPIC
Bacteria, UA: NONE SEEN
Bilirubin Urine: NEGATIVE
Glucose, UA: >=500 mg/dL — AB
Hgb urine dipstick: NEGATIVE
Ketones, ur: NEGATIVE mg/dL
Leukocytes,Ua: NEGATIVE
Nitrite: NEGATIVE
Protein, ur: NEGATIVE mg/dL
Specific Gravity, Urine: 1.03 (ref 1.005–1.030)
pH: 5 (ref 5.0–8.0)

## 2021-04-13 LAB — CBC
HCT: 46.8 % (ref 39.0–52.0)
Hemoglobin: 15.2 g/dL (ref 13.0–17.0)
MCH: 31 pg (ref 26.0–34.0)
MCHC: 32.5 g/dL (ref 30.0–36.0)
MCV: 95.3 fL (ref 80.0–100.0)
Platelets: 223 10*3/uL (ref 150–400)
RBC: 4.91 MIL/uL (ref 4.22–5.81)
RDW: 12.7 % (ref 11.5–15.5)
WBC: 9.3 10*3/uL (ref 4.0–10.5)
nRBC: 0 % (ref 0.0–0.2)

## 2021-04-13 LAB — TYPE AND SCREEN
ABO/RH(D): A POS
Antibody Screen: NEGATIVE

## 2021-04-13 LAB — BAYER DCA HB A1C WAIVED: HB A1C (BAYER DCA - WAIVED): 9.5 % — ABNORMAL HIGH (ref 4.8–5.6)

## 2021-04-13 LAB — SARS CORONAVIRUS 2 BY RT PCR (HOSPITAL ORDER, PERFORMED IN ~~LOC~~ HOSPITAL LAB): SARS Coronavirus 2: NEGATIVE

## 2021-04-13 LAB — SURGICAL PCR SCREEN
MRSA, PCR: POSITIVE — AB
Staphylococcus aureus: POSITIVE — AB

## 2021-04-13 MED ORDER — FREESTYLE LIBRE SENSOR SYSTEM MISC: 1.0000 | Freq: Four times a day (QID) | 1 refills | Status: DC

## 2021-04-13 NOTE — Progress Notes (Signed)
PCP - Evelina Dun, FNP Cardiologist - denies  PPM/ICD - n/a  Chest x-ray - 05/07/20 EKG - 11/25/20 Stress Test - 2011 ECHO - 2011 Cardiac Cath -denies   Sleep Study - denies CPAP - denies  Fasting Blood Sugar - 111-147 Checks Blood Sugar 2-3 times a day A1C today, 9.5.   Blood Thinner Instructions: n/a Aspirin Instructions: n/a  ERAS Protcol -Clear liquids until 0530 DOS. PRE-SURGERY Ensure or G2- none ordered.  COVID TEST- 04/13/21, done in PAT  Anesthesia review: Yes, per order by surgeon.  Patient denies shortness of breath, fever, cough and chest pain at PAT appointment   All instructions explained to the patient, with a verbal understanding of the material. Patient agrees to go over the instructions while at home for a better understanding. Patient also instructed to self quarantine after being tested for COVID-19. The opportunity to ask questions was provided.

## 2021-04-13 NOTE — Telephone Encounter (Signed)
Called and spoke with Stew he is going to send in for the 3 the newest one if this does not work he will try for the 2.

## 2021-04-13 NOTE — Progress Notes (Signed)
Virtual Visit  Note Due to COVID-19 pandemic this visit was conducted virtually. This visit type was conducted due to national recommendations for restrictions regarding the COVID-19 Pandemic (e.g. social distancing, sheltering in place) in an effort to limit this patient's exposure and mitigate transmission in our community. All issues noted in this document were discussed and addressed.  A physical exam was not performed with this format.  I connected with Charles Pennington on 04/13/21 at  by 1:30 pm telephone and verified that I am speaking with the correct person using two identifiers. Charles Pennington is currently located at car and no one is currently with him during visit. The provider, [name], FNP is located in their office at time of visit.  I discussed the limitations, risks, security and privacy concerns of performing an evaluation and management service by telephone and the availability of in person appointments. I also discussed with the patient that there may be a patient responsible charge related to this service. The patient expressed understanding and agreed to proceed.  Charles Pennington, Charles Pennington are scheduled for a virtual visit with your provider today.    Just as we do with appointments in the office, we must obtain your consent to participate.  Your consent will be active for this visit and any virtual visit you may have with one of our providers in the next 365 days.    If you have a MyChart account, I can also send a copy of this consent to you electronically.  All virtual visits are billed to your insurance company just like a traditional visit in the office.  As this is a virtual visit, video technology does not allow for your provider to perform a traditional examination.  This may limit your provider's ability to fully assess your condition.  If your provider identifies any concerns that need to be evaluated in person or the need to arrange testing such as labs, EKG, etc,  we will make arrangements to do so.    Although advances in technology are sophisticated, we cannot ensure that it will always work on either your end or our end.  If the connection with a video visit is poor, we may have to switch to a telephone visit.  With either a video or telephone visit, we are not always able to ensure that we have a secure connection.   I need to obtain your verbal consent now.   Are you willing to proceed with your visit today?   Charles Pennington has provided verbal consent on 04/13/2021 for a virtual visit (video or telephone).   [name], [place] 04/13/2021  3:12 PM    History and Present Illness:  Pt calls the office today to discuss diabetes. He came by and had his A1C drawn today that is elevated. He does report he has been taking his medication for the last few weeks and his glucose has been as low 111.  He is scheduled for oblique lumbar fusion on 04/16/21. Diabetes He presents for his follow-up diabetic visit. He has type 2 diabetes mellitus. Hypoglycemia symptoms include confusion. Associated symptoms include blurred vision and foot paresthesias. Symptoms are stable. Diabetic complications include a CVA, heart disease, nephropathy and peripheral neuropathy. Risk factors for coronary artery disease include dyslipidemia, diabetes mellitus, male sex, hypertension and sedentary lifestyle. He is following a generally unhealthy diet. His overall blood glucose range is 110-130 mg/dl.  Hypertension This is a chronic problem. The current episode started more than 1 year  ago. The problem has been resolved since onset. Associated symptoms include blurred vision, malaise/fatigue and shortness of breath. Pertinent negatives include no peripheral edema. Risk factors for coronary artery disease include dyslipidemia, diabetes mellitus, obesity and male gender. The current treatment provides moderate improvement. Hypertensive end-organ damage includes CVA.    Review of Systems   Constitutional:  Positive for malaise/fatigue.  Eyes:  Positive for blurred vision.  Respiratory:  Positive for shortness of breath.   Psychiatric/Behavioral:  Positive for confusion.   All other systems reviewed and are negative.   Observations/Objective: No SOB or distress noted   Assessment and Plan: 1. Hypertension associated with diabetes (HCC)  2. Type 2 diabetes mellitus treated with insulin (HCC) Libre Prescription sent to pharmacy.  Strict low carb diet Need to take insulin  - Continuous Blood Gluc Sensor (FREESTYLE LIBRE SENSOR SYSTEM) MISC; 1 Device by Does not apply route 4 (four) times daily.  Dispense: 1 each; Refill: 1  3. Noncompliance with medications Need to take medications  4. Chronic low back pain with sciatica, sciatica laterality unspecified, unspecified back pain laterality Keep follow up with Ortho       I discussed the assessment and treatment plan with the patient. The patient was provided an opportunity to ask questions and all were answered. The patient agreed with the plan and demonstrated an understanding of the instructions.   The patient was advised to call back or seek an in-person evaluation if the symptoms worsen or if the condition fails to improve as anticipated.  The above assessment and management plan was discussed with the patient. The patient verbalized understanding of and has agreed to the management plan. Patient is aware to call the clinic if symptoms persist or worsen. Patient is aware when to return to the clinic for a follow-up visit. Patient educated on when it is appropriate to go to the emergency department.   Time call ended:  1:42 pm   I provided 12 minutes of  non face-to-face time during this encounter.    [name], FNP

## 2021-04-14 ENCOUNTER — Ambulatory Visit: Payer: Self-pay | Admitting: Orthopedic Surgery

## 2021-04-14 NOTE — Progress Notes (Signed)
Left message for Cordelia Pen at Dr. Shon Baton' office notifying them that pt tested positive for MRSA.

## 2021-04-16 ENCOUNTER — Inpatient Hospital Stay (HOSPITAL_COMMUNITY): Admission: RE | Admit: 2021-04-16 | Payer: Medicare Other | Source: Ambulatory Visit | Admitting: Orthopedic Surgery

## 2021-04-16 ENCOUNTER — Other Ambulatory Visit: Payer: Self-pay | Admitting: Family

## 2021-04-16 DIAGNOSIS — F411 Generalized anxiety disorder: Secondary | ICD-10-CM

## 2021-04-16 DIAGNOSIS — F331 Major depressive disorder, recurrent, moderate: Secondary | ICD-10-CM

## 2021-04-16 SURGERY — OBLIQUE LUMBAR INTERBODY FUSION 1 LEVEL WITH PERCUTANEOUS SCREWS
Anesthesia: General

## 2021-04-20 ENCOUNTER — Telehealth: Payer: Self-pay | Admitting: Family

## 2021-04-20 MED ORDER — SULFAMETHOXAZOLE-TRIMETHOPRIM 800-160 MG PO TABS: 1.0000 | ORAL_TABLET | Freq: Two times a day (BID) | ORAL | 0 refills | Status: DC

## 2021-04-20 NOTE — Telephone Encounter (Signed)
Patient has been trying to fix his toenails and they are now bleeding. ? ?What should he do? ? ?Afraid of an infection. ? ?Please call. ?

## 2021-04-20 NOTE — Telephone Encounter (Signed)
Bactrim Prescription sent to pharmacy. Needs to keep appt.  ?

## 2021-04-20 NOTE — Telephone Encounter (Signed)
Patient has appt tomorrow for you to look at them but is very concerned that he is going to lose a toe asking if you will send in abx today. Please advise ?

## 2021-04-20 NOTE — Telephone Encounter (Signed)
VM box not set up

## 2021-04-21 ENCOUNTER — Encounter: Payer: Self-pay | Admitting: Family

## 2021-04-21 ENCOUNTER — Ambulatory Visit: Payer: Medicare Other | Admitting: Family

## 2021-04-24 NOTE — Telephone Encounter (Signed)
Patient never returned call and No showed for appt encounter will be closed  ?

## 2021-05-07 ENCOUNTER — Other Ambulatory Visit: Payer: Self-pay | Admitting: Family

## 2021-05-07 ENCOUNTER — Encounter: Payer: Self-pay | Admitting: Family

## 2021-05-07 DIAGNOSIS — J209 Acute bronchitis, unspecified: Secondary | ICD-10-CM

## 2021-05-07 DIAGNOSIS — R062 Wheezing: Secondary | ICD-10-CM

## 2021-05-07 NOTE — Telephone Encounter (Signed)
[name]. NTBS in April for 3 mos FU. Refill SENT to pharmacy ?

## 2021-05-07 NOTE — Telephone Encounter (Signed)
Letter sent.

## 2021-05-19 ENCOUNTER — Other Ambulatory Visit: Payer: Self-pay | Admitting: Family

## 2021-05-19 DIAGNOSIS — E118 Type 2 diabetes mellitus with unspecified complications: Secondary | ICD-10-CM

## 2021-07-23 ENCOUNTER — Other Ambulatory Visit: Payer: Self-pay | Admitting: Family

## 2021-07-23 ENCOUNTER — Ambulatory Visit (INDEPENDENT_AMBULATORY_CARE_PROVIDER_SITE_OTHER): Payer: Medicare Other | Admitting: Family

## 2021-07-23 ENCOUNTER — Encounter: Payer: Self-pay | Admitting: Family

## 2021-07-23 VITALS — BP 120/83 | HR 110 | Temp 98.7°F | Ht 68.0 in | Wt 195.0 lb

## 2021-07-23 DIAGNOSIS — E118 Type 2 diabetes mellitus with unspecified complications: Secondary | ICD-10-CM

## 2021-07-23 DIAGNOSIS — Z794 Long term (current) use of insulin: Secondary | ICD-10-CM

## 2021-07-23 DIAGNOSIS — J440 Chronic obstructive pulmonary disease with acute lower respiratory infection: Secondary | ICD-10-CM

## 2021-07-23 DIAGNOSIS — E1169 Type 2 diabetes mellitus with other specified complication: Secondary | ICD-10-CM | POA: Diagnosis not present

## 2021-07-23 DIAGNOSIS — K219 Gastro-esophageal reflux disease without esophagitis: Secondary | ICD-10-CM

## 2021-07-23 DIAGNOSIS — J209 Acute bronchitis, unspecified: Secondary | ICD-10-CM

## 2021-07-23 DIAGNOSIS — E119 Type 2 diabetes mellitus without complications: Secondary | ICD-10-CM

## 2021-07-23 DIAGNOSIS — E1142 Type 2 diabetes mellitus with diabetic polyneuropathy: Secondary | ICD-10-CM

## 2021-07-23 DIAGNOSIS — E039 Hypothyroidism, unspecified: Secondary | ICD-10-CM | POA: Diagnosis not present

## 2021-07-23 DIAGNOSIS — I152 Hypertension secondary to endocrine disorders: Secondary | ICD-10-CM

## 2021-07-23 DIAGNOSIS — J441 Chronic obstructive pulmonary disease with (acute) exacerbation: Secondary | ICD-10-CM

## 2021-07-23 DIAGNOSIS — F17200 Nicotine dependence, unspecified, uncomplicated: Secondary | ICD-10-CM

## 2021-07-23 DIAGNOSIS — R062 Wheezing: Secondary | ICD-10-CM

## 2021-07-23 DIAGNOSIS — E1159 Type 2 diabetes mellitus with other circulatory complications: Secondary | ICD-10-CM | POA: Diagnosis not present

## 2021-07-23 DIAGNOSIS — M5440 Lumbago with sciatica, unspecified side: Secondary | ICD-10-CM

## 2021-07-23 DIAGNOSIS — F411 Generalized anxiety disorder: Secondary | ICD-10-CM

## 2021-07-23 DIAGNOSIS — Z91148 Patient's other noncompliance with medication regimen for other reason: Secondary | ICD-10-CM | POA: Diagnosis not present

## 2021-07-23 DIAGNOSIS — G8929 Other chronic pain: Secondary | ICD-10-CM

## 2021-07-23 DIAGNOSIS — E663 Overweight: Secondary | ICD-10-CM | POA: Diagnosis not present

## 2021-07-23 DIAGNOSIS — F331 Major depressive disorder, recurrent, moderate: Secondary | ICD-10-CM

## 2021-07-23 DIAGNOSIS — E785 Hyperlipidemia, unspecified: Secondary | ICD-10-CM

## 2021-07-23 DIAGNOSIS — G4700 Insomnia, unspecified: Secondary | ICD-10-CM | POA: Diagnosis not present

## 2021-07-23 DIAGNOSIS — Z8673 Personal history of transient ischemic attack (TIA), and cerebral infarction without residual deficits: Secondary | ICD-10-CM

## 2021-07-23 LAB — BAYER DCA HB A1C WAIVED: HB A1C (BAYER DCA - WAIVED): 14 % — ABNORMAL HIGH (ref 4.8–5.6)

## 2021-07-23 MED ORDER — LEVOTHYROXINE SODIUM 200 MCG PO TABS: 200.0000 ug | ORAL_TABLET | Freq: Every day | ORAL | 2 refills | Status: DC

## 2021-07-23 MED ORDER — PREGABALIN 150 MG PO CAPS: ORAL_CAPSULE | ORAL | 2 refills | Status: AC

## 2021-07-23 MED ORDER — LEVOTHYROXINE SODIUM 175 MCG PO TABS: 175.0000 ug | ORAL_TABLET | Freq: Every day | ORAL | 1 refills | Status: DC

## 2021-07-23 MED ORDER — FREESTYLE LIBRE SENSOR SYSTEM MISC: 1.0000 | Freq: Four times a day (QID) | 1 refills | Status: DC

## 2021-07-23 MED ORDER — ULTICARE MICRO PEN NEEDLES 32G X 4 MM MISC: 3 refills | Status: AC

## 2021-07-23 MED ORDER — LISINOPRIL 10 MG PO TABS: 10.0000 mg | ORAL_TABLET | Freq: Every day | ORAL | 3 refills | Status: DC

## 2021-07-23 MED ORDER — ATORVASTATIN CALCIUM 80 MG PO TABS: 80.0000 mg | ORAL_TABLET | Freq: Every day | ORAL | 1 refills | Status: DC

## 2021-07-23 MED ORDER — TRULICITY 3 MG/0.5ML ~~LOC~~ SOAJ: SUBCUTANEOUS | 0 refills | Status: DC

## 2021-07-23 MED ORDER — METFORMIN HCL ER 750 MG PO TB24: 1500.0000 mg | ORAL_TABLET | Freq: Every day | ORAL | 1 refills | Status: DC

## 2021-07-23 MED ORDER — DAPAGLIFLOZIN PROPANEDIOL 10 MG PO TABS: 10.0000 mg | ORAL_TABLET | Freq: Every day | ORAL | 3 refills | Status: DC

## 2021-07-23 MED ORDER — TRESIBA FLEXTOUCH 200 UNIT/ML ~~LOC~~ SOPN: 25.0000 [IU] | PEN_INJECTOR | Freq: Every evening | SUBCUTANEOUS | 0 refills | Status: DC

## 2021-07-23 MED ORDER — TRELEGY ELLIPTA 100-62.5-25 MCG/ACT IN AEPB: INHALATION_SPRAY | RESPIRATORY_TRACT | 2 refills | Status: DC

## 2021-07-23 MED ORDER — PAROXETINE HCL 40 MG PO TABS: 40.0000 mg | ORAL_TABLET | Freq: Every day | ORAL | 1 refills | Status: DC

## 2021-07-23 NOTE — Progress Notes (Signed)
Subjective:    Patient ID: Charles Pennington, male    DOB: 02/28/1965, 56 y.o.   MRN: [id number]  Chief Complaint  Patient presents with   Blood Sugar Problem    BG meter states it is reading high , feels really bad , light headed    PT presents to the office today with chronic follow up. He is noncompliant with his medications. He has not taken his medications in the last 90 days because he was in jail.    He is followed by Neurosurgeon for chronic back pain. He is suppose to have back surgery, but has not been able to get his blood glucose stable.   He has COPD and continues to smoke a pack a day. Has SOB with exertion.    He has a hx of substance abuse and admits to taking adderall at times.  Diabetes He presents for his follow-up diabetic visit. He has type 2 diabetes mellitus. Hypoglycemia symptoms include nervousness/anxiousness. Associated symptoms include blurred vision, fatigue and foot paresthesias. Symptoms are stable. Diabetic complications include heart disease, nephropathy and peripheral neuropathy. Risk factors for coronary artery disease include dyslipidemia, diabetes mellitus, male sex, hypertension and sedentary lifestyle. He is following a generally healthy diet. His overall blood glucose range is >200 mg/dl.  Hypertension This is a chronic problem. The current episode started more than 1 year ago. The problem has been resolved since onset. The problem is controlled. Associated symptoms include anxiety, blurred vision and malaise/fatigue. Pertinent negatives include no peripheral edema or shortness of breath. Risk factors for coronary artery disease include dyslipidemia, diabetes mellitus, obesity, male gender, smoking/tobacco exposure and sedentary lifestyle. Identifiable causes of hypertension include a thyroid problem.  Gastroesophageal Reflux He complains of belching, heartburn and a hoarse voice. This is a chronic problem. The current episode started more than 1 year  ago. The problem occurs occasionally. The symptoms are aggravated by certain foods. Associated symptoms include fatigue. He has tried a diet change for the symptoms. The treatment provided moderate relief.  Depression        This is a chronic problem.  The current episode started more than 1 year ago.   The onset quality is gradual.   The problem occurs intermittently.  Associated symptoms include fatigue, irritable and sad.  Associated symptoms include no helplessness and no hopelessness.  Past treatments include nothing.  Past medical history includes thyroid problem and anxiety.   Anxiety Presents for follow-up visit. Symptoms include depressed mood, excessive worry, irritability and nervous/anxious behavior. Patient reports no shortness of breath. Symptoms occur most days.    Thyroid Problem Presents for follow-up visit. Symptoms include anxiety, depressed mood, fatigue and hoarse voice. The symptoms have been worsening.      Review of Systems  Constitutional:  Positive for fatigue, irritability and malaise/fatigue.  HENT:  Positive for hoarse voice.   Eyes:  Positive for blurred vision.  Respiratory:  Negative for shortness of breath.   Gastrointestinal:  Positive for heartburn.  Psychiatric/Behavioral:  Positive for depression. The patient is nervous/anxious.   All other systems reviewed and are negative.      Objective:   Physical Exam Vitals reviewed.  Constitutional:      General: [name]. He is not in acute distress.    Appearance: He is well-developed.  HENT:     Head: Normocephalic.     Right Ear: Tympanic membrane normal.     Left Ear: Tympanic membrane normal.  Eyes:  General:        Right eye: No discharge.        Left eye: No discharge.     Pupils: Pupils are equal, round, and reactive to light.  Neck:     Thyroid: No thyromegaly.  Cardiovascular:     Rate and Rhythm: Normal rate and regular rhythm.     Heart sounds: Normal heart sounds. No murmur  heard. Pulmonary:     Effort: Pulmonary effort is normal. No respiratory distress.     Breath sounds: Normal breath sounds. No wheezing.  Abdominal:     General: Bowel sounds are normal. There is no distension.     Palpations: Abdomen is soft.     Tenderness: There is no abdominal tenderness.  Musculoskeletal:        General: No tenderness. Normal range of motion.     Cervical back: Normal range of motion and neck supple.  Skin:    General: Skin is warm and dry.     Findings: No erythema or rash.  Neurological:     Mental Status: He is alert and oriented to person, place, and time.     Cranial Nerves: No cranial nerve deficit.     Deep Tendon Reflexes: Reflexes are normal and symmetric.  Psychiatric:        Behavior: Behavior normal.        Thought Content: Thought content normal.        Judgment: Judgment normal.       BP 120/83   Pulse (!) 110   Temp 98.7 F (37.1 C)   Ht 5\' 8"  (1.727 m)   Wt 195 lb (88.5 kg)   SpO2 95%   BMI 29.65 kg/m      Assessment & Plan:  Charles Pennington comes in today with chief complaint of Blood Sugar Problem (BG meter states it is reading high , feels really bad , light headed )   Diagnosis and orders addressed:  1. Diabetic polyneuropathy associated with type 2 diabetes mellitus (HCC) - Insulin Pen Needle (ULTICARE MICRO PEN NEEDLES) 32G X 4 MM MISC; USE AS DIRECTED TWICE A DAY  Dispense: 100 each; Refill: 3  2. Type 2 diabetes mellitus treated with insulin (HCC) - Insulin Pen Needle (ULTICARE MICRO PEN NEEDLES) 32G X 4 MM MISC; USE AS DIRECTED TWICE A DAY  Dispense: 100 each; Refill: 3 - Bayer DCA Hb A1c Waived - Continuous Blood Gluc Sensor (FREESTYLE LIBRE SENSOR SYSTEM) MISC; 1 Device by Does not apply route 4 (four) times daily.  Dispense: 1 each; Refill: 1 - pregabalin (LYRICA) 150 MG capsule; TAKE UP TO 2 CAPSULES EVERY MORNING & 1 CAPSULE EVERY EVENING  Dispense: 90 capsule; Refill: 2  3. Hypertension associated with  diabetes (HCC)  - lisinopril (ZESTRIL) 10 MG tablet; Take 1 tablet (10 mg total) by mouth daily.  Dispense: 90 tablet; Refill: 3  4. Diabetes mellitus with complication (HCC) - metFORMIN (GLUCOPHAGE-XR) 750 MG 24 hr tablet; Take 2 tablets (1,500 mg total) by mouth daily with breakfast.  Dispense: 180 tablet; Refill: 1 - dapagliflozin propanediol (FARXIGA) 10 MG TABS tablet; Take 1 tablet (10 mg total) by mouth daily.  Dispense: 90 tablet; Refill: 3 - insulin degludec (TRESIBA FLEXTOUCH) 200 UNIT/ML FlexTouch Pen; Inject 26 Units into the skin at bedtime.  Dispense: 18 mL; Refill: 0 - Dulaglutide (TRULICITY) 3 MG/0.5ML SOPN; INJECT 3MG  (0.5ML) ONCE WEEKLY AS DIRECTED  Dispense: 6 mL; Refill: 0  5. Gastroesophageal reflux disease  6.  Hypothyroidism, unspecified type - levothyroxine (SYNTHROID) 175 MCG tablet; Take 1 tablet (175 mcg total) by mouth daily before breakfast.  Dispense: 90 tablet; Refill: 1 - levothyroxine (SYNTHROID) 200 MCG tablet; Take 1 tablet (200 mcg total) by mouth daily.  Dispense: 90 tablet; Refill: 2  7. Hyperlipidemia associated with type 2 diabetes mellitus (HCC) - atorvastatin (LIPITOR) 80 MG tablet; Take 1 tablet (80 mg total) by mouth daily.  Dispense: 90 tablet; Refill: 1  8. Gastroesophageal reflux disease, unspecified whether esophagitis present  9. COPD with acute exacerbation (HCC)  - Fluticasone-Umeclidin-Vilant (TRELEGY ELLIPTA) 100-62.5-25 MCG/ACT AEPB; INHALE 1 ONCE A DAY AS DIRECTED  Dispense: 60 each; Refill: 2  10. SMOKER  11. Noncompliance with medications  12. Overweight (BMI 25.0-29.9)  13. Insomnia, unspecified type  14. Hyperlipidemia, unspecified hyperlipidemia type  15. Moderate episode of recurrent major depressive disorder (HCC)  - PARoxetine (PAXIL) 40 MG tablet; Take 1 tablet (40 mg total) by mouth daily.  Dispense: 90 tablet; Refill: 1  16. History of CVA (cerebrovascular accident)  101. GAD (generalized anxiety disorder) -  PARoxetine (PAXIL) 40 MG tablet; Take 1 tablet (40 mg total) by mouth daily.  Dispense: 90 tablet; Refill: 1  18. Chronic low back pain with sciatica, sciatica laterality unspecified, unspecified back pain laterality  19. Acute bronchitis with COPD (HCC) - Fluticasone-Umeclidin-Vilant (TRELEGY ELLIPTA) 100-62.5-25 MCG/ACT AEPB; INHALE 1 ONCE A DAY AS DIRECTED  Dispense: 60 each; Refill: 2  20. Wheezing - Fluticasone-Umeclidin-Vilant (TRELEGY ELLIPTA) 100-62.5-25 MCG/ACT AEPB; INHALE 1 ONCE A DAY AS DIRECTED  Dispense: 60 each; Refill: 2   Labs pending Health Maintenance reviewed Diet and exercise encouraged  Follow up plan: 2-4 weeks to recheck DM.    [name], FNP

## 2021-07-23 NOTE — Patient Instructions (Signed)

## 2021-07-24 ENCOUNTER — Telehealth: Payer: Self-pay | Admitting: Family

## 2021-07-24 ENCOUNTER — Other Ambulatory Visit: Payer: Self-pay | Admitting: Family

## 2021-07-24 DIAGNOSIS — E039 Hypothyroidism, unspecified: Secondary | ICD-10-CM

## 2021-07-24 DIAGNOSIS — E1169 Type 2 diabetes mellitus with other specified complication: Secondary | ICD-10-CM

## 2021-07-24 DIAGNOSIS — E119 Type 2 diabetes mellitus without complications: Secondary | ICD-10-CM

## 2021-07-24 NOTE — Telephone Encounter (Signed)
NA/NVM need name of company for [place] order

## 2021-07-29 MED ORDER — FREESTYLE LIBRE 2 READER DEVI: 0 refills | Status: DC

## 2021-07-29 MED ORDER — FREESTYLE LIBRE 2 SENSOR MISC: 11 refills | Status: DC

## 2021-07-29 NOTE — Telephone Encounter (Signed)
Attempting to send Libre CGM to local pharmacy  [facility] medicare/caid on insulin should be able to go to local

## 2021-08-07 ENCOUNTER — Other Ambulatory Visit: Payer: Medicare Other

## 2021-08-07 DIAGNOSIS — Z794 Long term (current) use of insulin: Secondary | ICD-10-CM | POA: Diagnosis not present

## 2021-08-07 DIAGNOSIS — E785 Hyperlipidemia, unspecified: Secondary | ICD-10-CM | POA: Diagnosis not present

## 2021-08-07 DIAGNOSIS — E1169 Type 2 diabetes mellitus with other specified complication: Secondary | ICD-10-CM | POA: Diagnosis not present

## 2021-08-07 DIAGNOSIS — E039 Hypothyroidism, unspecified: Secondary | ICD-10-CM | POA: Diagnosis not present

## 2021-08-07 DIAGNOSIS — E119 Type 2 diabetes mellitus without complications: Secondary | ICD-10-CM | POA: Diagnosis not present

## 2021-08-10 LAB — CBC WITH DIFFERENTIAL/PLATELET
Basophils Absolute: 0.1 10*3/uL (ref 0.0–0.2)
Basos: 1 %
EOS (ABSOLUTE): 0.2 10*3/uL (ref 0.0–0.4)
Eos: 3 %
Hematocrit: 46.7 % (ref 37.5–51.0)
Hemoglobin: 15 g/dL (ref 13.0–17.7)
Immature Grans (Abs): 0 10*3/uL (ref 0.0–0.1)
Immature Granulocytes: 0 %
Lymphocytes Absolute: 2.2 10*3/uL (ref 0.7–3.1)
Lymphs: 32 %
MCH: 30.4 pg (ref 26.6–33.0)
MCHC: 32.1 g/dL (ref 31.5–35.7)
MCV: 95 fL (ref 79–97)
Monocytes Absolute: 0.4 10*3/uL (ref 0.1–0.9)
Monocytes: 5 %
Neutrophils Absolute: 4.1 10*3/uL (ref 1.4–7.0)
Neutrophils: 59 %
Platelets: 257 10*3/uL (ref 150–450)
RBC: 4.94 x10E6/uL (ref 4.14–5.80)
RDW: 12.8 % (ref 11.6–15.4)
WBC: 7 10*3/uL (ref 3.4–10.8)

## 2021-08-10 LAB — CMP14+EGFR
ALT: 28 IU/L (ref 0–44)
AST: 24 IU/L (ref 0–40)
Albumin/Globulin Ratio: 1.9 (ref 1.2–2.2)
Albumin: 4.2 g/dL (ref 3.8–4.9)
Alkaline Phosphatase: 100 IU/L (ref 44–121)
BUN/Creatinine Ratio: 12 (ref 9–20)
BUN: 11 mg/dL (ref 6–24)
Bilirubin Total: 0.3 mg/dL (ref 0.0–1.2)
CO2: 25 mmol/L (ref 20–29)
Calcium: 9.5 mg/dL (ref 8.7–10.2)
Chloride: 95 mmol/L — ABNORMAL LOW (ref 96–106)
Creatinine, Ser: 0.89 mg/dL (ref 0.76–1.27)
Globulin, Total: 2.2 g/dL (ref 1.5–4.5)
Glucose: 424 mg/dL (ref 70–99)
Potassium: 3.9 mmol/L (ref 3.5–5.2)
Sodium: 136 mmol/L (ref 134–144)
Total Protein: 6.4 g/dL (ref 6.0–8.5)
eGFR: 101 mL/min/{1.73_m2} (ref >59)

## 2021-08-10 LAB — LIPID PANEL
Chol/HDL Ratio: 2.2 ratio (ref 0.0–5.0)
Cholesterol, Total: 110 mg/dL (ref 100–199)
HDL: 49 mg/dL (ref >39)
LDL Chol Calc (NIH): 41 mg/dL (ref 0–99)
Triglycerides: 111 mg/dL (ref 0–149)
VLDL Cholesterol Cal: 20 mg/dL (ref 5–40)

## 2021-08-10 LAB — TSH: TSH: 1.5 u[IU]/mL (ref 0.450–4.500)

## 2021-08-11 ENCOUNTER — Ambulatory Visit: Payer: Medicare Other | Admitting: Family

## 2021-08-12 ENCOUNTER — Telehealth: Payer: Medicare Other

## 2021-08-14 ENCOUNTER — Ambulatory Visit: Payer: Medicare Other

## 2021-08-19 ENCOUNTER — Telehealth: Payer: Medicare Other

## 2021-08-24 ENCOUNTER — Ambulatory Visit: Payer: Self-pay | Admitting: *Deleted

## 2021-08-24 NOTE — Chronic Care Management (AMB) (Signed)
  Chronic Care Management   Note  08/24/2021 Name: Charles Pennington MRN: [id number] DOB: 10/22/1965   Patient has not recently engaged with the Chronic Care Management RN Care Manager. Removing RN Care Manager from Care Team and closing RN Care Management Care Plans. If patient is currently engaged with another CCM team member I will forward this encounter to inform them of my case closure. Patient may be eligible for re-engagement with RN Care Manager in the future if necessary and can discuss this with their PCP.  Demetrios Loll, BSN, RN-BC Embedded Chronic Care Manager [facility] Care Management Direct Dial: [phone]

## 2021-08-26 ENCOUNTER — Other Ambulatory Visit: Payer: Self-pay | Admitting: Family

## 2021-08-26 DIAGNOSIS — G4700 Insomnia, unspecified: Secondary | ICD-10-CM

## 2021-08-28 ENCOUNTER — Ambulatory Visit: Payer: Medicare Other | Admitting: Family

## 2021-09-04 ENCOUNTER — Ambulatory Visit: Payer: Medicare Other | Admitting: Family

## 2021-09-09 ENCOUNTER — Ambulatory Visit: Payer: Medicare Other | Admitting: Pharmacist

## 2021-09-09 DIAGNOSIS — E119 Type 2 diabetes mellitus without complications: Secondary | ICD-10-CM

## 2021-09-09 NOTE — Progress Notes (Signed)
  PharmD Medication Management      09/09/2021 Name: Charles Pennington MRN: [id number] DOB: 1966/01/27   Referred by: Junie Spencer, FNP Reason for referral : Diabetes/medication management   An unsuccessful telephone outreach was attempted today. The patient was referred to the case management team for assistance with care management and care coordination.  Patient was able to fill Libre 2 CGM at the local pharmacy.  I encouraged him to bring this to appt to set up.   Follow Up Plan:  unable to leave a VM; will call in 3-5 business days; additional PharmD appt made for 10/01/21      Kieth Brightly, PharmD, BCPS Clinical Pharmacist, [facility]  II Phone [phone]

## 2021-09-21 ENCOUNTER — Telehealth: Payer: Self-pay | Admitting: Family

## 2021-09-21 DIAGNOSIS — F411 Generalized anxiety disorder: Secondary | ICD-10-CM

## 2021-09-21 DIAGNOSIS — R062 Wheezing: Secondary | ICD-10-CM

## 2021-09-21 DIAGNOSIS — J441 Chronic obstructive pulmonary disease with (acute) exacerbation: Secondary | ICD-10-CM

## 2021-09-21 DIAGNOSIS — G4700 Insomnia, unspecified: Secondary | ICD-10-CM

## 2021-09-21 DIAGNOSIS — E1169 Type 2 diabetes mellitus with other specified complication: Secondary | ICD-10-CM

## 2021-09-21 DIAGNOSIS — E039 Hypothyroidism, unspecified: Secondary | ICD-10-CM

## 2021-09-21 DIAGNOSIS — Z794 Long term (current) use of insulin: Secondary | ICD-10-CM

## 2021-09-21 DIAGNOSIS — Z8673 Personal history of transient ischemic attack (TIA), and cerebral infarction without residual deficits: Secondary | ICD-10-CM

## 2021-09-21 DIAGNOSIS — F331 Major depressive disorder, recurrent, moderate: Secondary | ICD-10-CM

## 2021-09-21 DIAGNOSIS — K219 Gastro-esophageal reflux disease without esophagitis: Secondary | ICD-10-CM

## 2021-09-21 DIAGNOSIS — J209 Acute bronchitis, unspecified: Secondary | ICD-10-CM

## 2021-09-21 DIAGNOSIS — E118 Type 2 diabetes mellitus with unspecified complications: Secondary | ICD-10-CM

## 2021-09-21 DIAGNOSIS — Z91148 Patient's other noncompliance with medication regimen for other reason: Secondary | ICD-10-CM

## 2021-09-21 DIAGNOSIS — E1159 Type 2 diabetes mellitus with other circulatory complications: Secondary | ICD-10-CM

## 2021-09-21 NOTE — Telephone Encounter (Signed)
Spoke to patient has a lot going on with the law so he has and can not make his appointments anytime soon. FYI

## 2021-09-22 NOTE — Telephone Encounter (Signed)
Attempted to contact patient, will try later.   [name], FNP

## 2021-09-23 ENCOUNTER — Other Ambulatory Visit: Payer: Self-pay | Admitting: Family

## 2021-09-23 DIAGNOSIS — G4700 Insomnia, unspecified: Secondary | ICD-10-CM

## 2021-09-24 ENCOUNTER — Ambulatory Visit (INDEPENDENT_AMBULATORY_CARE_PROVIDER_SITE_OTHER): Payer: Medicare PPO | Admitting: Family

## 2021-09-24 ENCOUNTER — Other Ambulatory Visit (INDEPENDENT_AMBULATORY_CARE_PROVIDER_SITE_OTHER): Payer: Medicare Other | Admitting: Family

## 2021-09-24 DIAGNOSIS — E785 Hyperlipidemia, unspecified: Secondary | ICD-10-CM | POA: Diagnosis not present

## 2021-09-24 DIAGNOSIS — E039 Hypothyroidism, unspecified: Secondary | ICD-10-CM

## 2021-09-24 DIAGNOSIS — E1169 Type 2 diabetes mellitus with other specified complication: Secondary | ICD-10-CM | POA: Diagnosis not present

## 2021-09-24 DIAGNOSIS — I152 Hypertension secondary to endocrine disorders: Secondary | ICD-10-CM | POA: Diagnosis not present

## 2021-09-24 DIAGNOSIS — J441 Chronic obstructive pulmonary disease with (acute) exacerbation: Secondary | ICD-10-CM

## 2021-09-24 DIAGNOSIS — E118 Type 2 diabetes mellitus with unspecified complications: Secondary | ICD-10-CM | POA: Diagnosis not present

## 2021-09-24 DIAGNOSIS — E1159 Type 2 diabetes mellitus with other circulatory complications: Secondary | ICD-10-CM | POA: Diagnosis not present

## 2021-09-24 DIAGNOSIS — K219 Gastro-esophageal reflux disease without esophagitis: Secondary | ICD-10-CM

## 2021-09-24 DIAGNOSIS — E1142 Type 2 diabetes mellitus with diabetic polyneuropathy: Secondary | ICD-10-CM

## 2021-09-24 MED ORDER — TRULICITY 3 MG/0.5ML ~~LOC~~ SOAJ: SUBCUTANEOUS | 0 refills | Status: DC

## 2021-09-24 MED ORDER — LISINOPRIL 10 MG PO TABS: 10.0000 mg | ORAL_TABLET | Freq: Every day | ORAL | 3 refills | Status: DC

## 2021-09-24 MED ORDER — PAROXETINE HCL 40 MG PO TABS: 40.0000 mg | ORAL_TABLET | Freq: Every day | ORAL | 1 refills | Status: DC

## 2021-09-24 MED ORDER — TRELEGY ELLIPTA 100-62.5-25 MCG/ACT IN AEPB
INHALATION_SPRAY | RESPIRATORY_TRACT | 2 refills | Status: DC
Start: 2021-09-24 — End: 2022-04-27

## 2021-09-24 MED ORDER — LEVOTHYROXINE SODIUM 200 MCG PO TABS: 200.0000 ug | ORAL_TABLET | Freq: Every day | ORAL | 2 refills | Status: DC

## 2021-09-24 MED ORDER — DAPAGLIFLOZIN PROPANEDIOL 10 MG PO TABS
10.0000 mg | ORAL_TABLET | Freq: Every day | ORAL | 3 refills | Status: DC
Start: 2021-09-24 — End: 2022-04-27

## 2021-09-24 MED ORDER — METFORMIN HCL ER 750 MG PO TB24: 1500.0000 mg | ORAL_TABLET | Freq: Every day | ORAL | 1 refills | Status: DC

## 2021-09-24 MED ORDER — TRESIBA FLEXTOUCH 200 UNIT/ML ~~LOC~~ SOPN: 25.0000 [IU] | PEN_INJECTOR | Freq: Every evening | SUBCUTANEOUS | 0 refills | Status: DC

## 2021-09-24 MED ORDER — ATORVASTATIN CALCIUM 80 MG PO TABS: 80.0000 mg | ORAL_TABLET | Freq: Every day | ORAL | 1 refills | Status: DC

## 2021-09-24 NOTE — Addendum Note (Signed)
Addended by: [name] A on: 09/24/2021 01:45 PM   Modules accepted: Orders

## 2021-09-24 NOTE — Progress Notes (Signed)
Virtual Visit  Note Due to COVID-19 pandemic this visit was conducted virtually. This visit type was conducted due to national recommendations for restrictions regarding the COVID-19 Pandemic (e.g. social distancing, sheltering in place) in an effort to limit this patient's exposure and mitigate transmission in our community. All issues noted in this document were discussed and addressed.  A physical exam was not performed with this format.  I connected with Charles Pennington on 09/24/21 at 1:30 pm by telephone and verified that I am speaking with the correct person using two identifiers. Charles Pennington is currently located at  home and no one is currently with him during visit. The provider, [name], FNP is located in their office at time of visit.  I discussed the limitations, risks, security and privacy concerns of performing an evaluation and management service by telephone and the availability of in person appointments. I also discussed with the patient that there may be a patient responsible charge related to this service. The patient expressed understanding and agreed to proceed.   History and Present Illness:  Diabetes He presents for his follow-up diabetic visit. He has type 2 diabetes mellitus. Associated symptoms include blurred vision and foot paresthesias. Symptoms are stable. Risk factors for coronary artery disease include diabetes mellitus, dyslipidemia, male sex, hypertension and sedentary lifestyle. He is following a generally unhealthy diet. His overall blood glucose range is 180-200 mg/dl.  Hypertension This is a chronic problem. The current episode started more than 1 year ago. The problem has been waxing and waning since onset. The problem is uncontrolled. Associated symptoms include blurred vision, malaise/fatigue, peripheral edema and shortness of breath. Risk factors for coronary artery disease include dyslipidemia, diabetes mellitus, obesity and male gender. The  current treatment provides moderate improvement.  Gastroesophageal Reflux He complains of belching and heartburn. This is a chronic problem. The current episode started more than 1 year ago. The problem occurs occasionally. Risk factors include obesity. He has tried a PPI for the symptoms. The treatment provided moderate relief.  Hyperlipidemia This is a chronic problem. The current episode started more than 1 year ago. Exacerbating diseases include obesity. Associated symptoms include shortness of breath. The current treatment provides moderate improvement of lipids. Risk factors for coronary artery disease include dyslipidemia, hypertension and a sedentary lifestyle.      Review of Systems  Constitutional:  Positive for malaise/fatigue.  Eyes:  Positive for blurred vision.  Respiratory:  Positive for shortness of breath.   Gastrointestinal:  Positive for heartburn.     Observations/Objective: No SOB or distress noted, anxious. Intermittent cough.   Assessment and Plan: 1. Hypertension associated with diabetes (HCC)  2. COPD with acute exacerbation (HCC)  3. Gastroesophageal reflux disease, unspecified whether esophagitis present  4. Diabetes mellitus with complication (HCC)  5. Diabetic polyneuropathy associated with type 2 diabetes mellitus (HCC)  6. Hyperlipidemia associated with type 2 diabetes mellitus (HCC)  7. Hypothyroidism, unspecified type   All medications except controlled refilled today.  He has several warrants out for him and was scared to come into office and has been out of his medications. He will call and make an appointment to be seen for labs and refilled medications.  Discussed importance of taking medications every day!!! Strict low carb diet.       I discussed the assessment and treatment plan with the patient. The patient was provided an opportunity to ask questions and all were answered. The patient agreed with the plan and demonstrated an  understanding of the instructions.   The patient was advised to call back or seek an in-person evaluation if the symptoms worsen or if the condition fails to improve as anticipated.  The above assessment and management plan was discussed with the patient. The patient verbalized understanding of and has agreed to the management plan. Patient is aware to call the clinic if symptoms persist or worsen. Patient is aware when to return to the clinic for a follow-up visit. Patient educated on when it is appropriate to go to the emergency department.   Time call ended:  1:44 pm   I provided 14 minutes of  non face-to-face time during this encounter.    [name], FNP

## 2021-09-24 NOTE — Progress Notes (Signed)
See previous note

## 2021-10-01 ENCOUNTER — Ambulatory Visit: Payer: Medicaid Other | Admitting: Pharmacist

## 2021-10-05 ENCOUNTER — Telehealth: Payer: Self-pay | Admitting: Family

## 2021-10-05 NOTE — Telephone Encounter (Signed)
Patient is unsure if he needs another appointment and if so, when. He stated that he is not able to drive and would like to know if Neysa Bonito can do a televisit with him. Please call back and advise.

## 2021-10-05 NOTE — Telephone Encounter (Signed)
Appt made

## 2021-10-05 NOTE — Telephone Encounter (Signed)
Ok for telephone or video visit.

## 2021-10-05 NOTE — Telephone Encounter (Signed)
Patient's last visit with you was on 07/23/21.  He does not have a follow up scheduled.  Can we do a tele visit with him?

## 2021-10-06 ENCOUNTER — Telehealth (INDEPENDENT_AMBULATORY_CARE_PROVIDER_SITE_OTHER): Payer: Medicare Other | Admitting: Family

## 2021-10-06 ENCOUNTER — Encounter: Payer: Self-pay | Admitting: Family

## 2021-10-06 DIAGNOSIS — E118 Type 2 diabetes mellitus with unspecified complications: Secondary | ICD-10-CM

## 2021-10-06 DIAGNOSIS — I152 Hypertension secondary to endocrine disorders: Secondary | ICD-10-CM

## 2021-10-06 DIAGNOSIS — E039 Hypothyroidism, unspecified: Secondary | ICD-10-CM

## 2021-10-06 DIAGNOSIS — E1159 Type 2 diabetes mellitus with other circulatory complications: Secondary | ICD-10-CM | POA: Diagnosis not present

## 2021-10-06 DIAGNOSIS — Z794 Long term (current) use of insulin: Secondary | ICD-10-CM | POA: Diagnosis not present

## 2021-10-06 DIAGNOSIS — E119 Type 2 diabetes mellitus without complications: Secondary | ICD-10-CM | POA: Diagnosis not present

## 2021-10-06 DIAGNOSIS — E1142 Type 2 diabetes mellitus with diabetic polyneuropathy: Secondary | ICD-10-CM

## 2021-10-06 MED ORDER — TRESIBA FLEXTOUCH 200 UNIT/ML ~~LOC~~ SOPN: 25.0000 [IU] | PEN_INJECTOR | Freq: Every evening | SUBCUTANEOUS | 0 refills | Status: DC

## 2021-10-06 MED ORDER — TRULICITY 3 MG/0.5ML ~~LOC~~ SOAJ: SUBCUTANEOUS | 0 refills | Status: DC

## 2021-10-06 NOTE — Progress Notes (Signed)
Virtual Visit  Note Due to COVID-19 pandemic this visit was conducted virtually. This visit type was conducted due to national recommendations for restrictions regarding the COVID-19 Pandemic (e.g. social distancing, sheltering in place) in an effort to limit this patient's exposure and mitigate transmission in our community. All issues noted in this document were discussed and addressed.  A physical exam was not performed with this format.  I connected with Charles Pennington on 10/06/21 at 11:18 AM by telephone and verified that I am speaking with the correct person using two identifiers. Charles Pennington is currently located at home and no one is currently with him during visit. The provider, Evelina Dun, FNP is located in their office at time of visit.  I discussed the limitations, risks, security and privacy concerns of performing an evaluation and management service by telephone and the availability of in person appointments. I also discussed with the patient that there may be a patient responsible charge related to this service. The patient expressed understanding and agreed to proceed.   Charles Pennington, Charles Pennington are scheduled for a virtual visit with your provider today.    Just as we do with appointments in the office, we must obtain your consent to participate.  Your consent will be active for this visit and any virtual visit you may have with one of our providers in the next 365 days.    If you have a MyChart account, I can also send a copy of this consent to you electronically.  All virtual visits are billed to your insurance company just like a traditional visit in the office.  As this is a virtual visit, video technology does not allow for your provider to perform a traditional examination.  This may limit your provider's ability to fully assess your condition.  If your provider identifies any concerns that need to be evaluated in person or the need to arrange testing such as labs, EKG,  etc, we will make arrangements to do so.    Although advances in technology are sophisticated, we cannot ensure that it will always work on either your end or our end.  If the connection with a video visit is poor, we may have to switch to a telephone visit.  With either a video or telephone visit, we are not always able to ensure that we have a secure connection.   I need to obtain your verbal consent now.   Are you willing to proceed with your visit today?   Charles Pennington has provided verbal consent on 10/06/2021 for a virtual visit (video or telephone).   Evelina Dun, [place] 10/06/2021  11:21 AM   History and Present Illness:  PT calls the office today for refill medications. He states he is having some legal issues and having a hard time getting here to appointments.  Diabetes He presents for his follow-up diabetic visit. He has type 2 diabetes mellitus. Associated symptoms include blurred vision, fatigue and foot paresthesias. Risk factors for coronary artery disease include dyslipidemia, male sex and hypertension. He is following a generally unhealthy diet. (Does not check) An ACE inhibitor/angiotensin II receptor blocker is being taken.  Depression        This is a chronic problem.  The current episode started more than 1 year ago.   The onset quality is gradual.   The problem occurs intermittently.  Associated symptoms include fatigue, helplessness, hopelessness, irritable and restlessness.  Past treatments include SSRIs - Selective serotonin reuptake inhibitors. Hypertension This  is a chronic problem. The current episode started more than 1 year ago. The problem has been resolved since onset. The problem is controlled. Associated symptoms include blurred vision and malaise/fatigue. Pertinent negatives include no peripheral edema or shortness of breath. Risk factors for coronary artery disease include dyslipidemia, diabetes mellitus, obesity, male gender and smoking/tobacco exposure.   Hyperlipidemia This is a chronic problem. The current episode started more than 1 year ago. The problem is uncontrolled. Factors aggravating his hyperlipidemia include smoking. Pertinent negatives include no shortness of breath. Current antihyperlipidemic treatment includes statins. The current treatment provides moderate improvement of lipids. Risk factors for coronary artery disease include dyslipidemia, male sex, hypertension and a sedentary lifestyle.      Review of Systems  Constitutional:  Positive for fatigue and malaise/fatigue.  Eyes:  Positive for blurred vision.  Respiratory:  Negative for shortness of breath.   Psychiatric/Behavioral:  Positive for depression.   All other systems reviewed and are negative.    Observations/Objective: No SOB or distress noted   Assessment and Plan: 1. Hypertension associated with diabetes ([place]) - CMP14+EGFR; Future  2. Diabetic polyneuropathy associated with type 2 diabetes mellitus (HCC) - CMP14+EGFR; Future - Dulaglutide (TRULICITY) 3 MW/1.0UV SOPN; INJECT 3MG (0.5ML) ONCE WEEKLY AS DIRECTED  Dispense: 6 mL; Refill: 0 - insulin degludec (TRESIBA FLEXTOUCH) 200 UNIT/ML FlexTouch Pen; Inject 26 Units into the skin at bedtime.  Dispense: 18 mL; Refill: 0  3. Hypothyroidism, unspecified type - CMP14+EGFR; Future - TSH; Future  4. Type 2 diabetes mellitus treated with insulin (HCC) - CMP14+EGFR; Future - Bayer DCA Hb A1c Waived; Future  5. Diabetes mellitus with complication (HCC) - Dulaglutide (TRULICITY) 3 OZ/3.6UY SOPN; INJECT 3MG (0.5ML) ONCE WEEKLY AS DIRECTED  Dispense: 6 mL; Refill: 0 - insulin degludec (TRESIBA FLEXTOUCH) 200 UNIT/ML FlexTouch Pen; Inject 26 Units into the skin at bedtime.  Dispense: 18 mL; Refill: 0  Pt will come in office for labs Long discussion about importance of taking medications daily Will call and get insurance corrected so he can get all of his medications  Follow up in 2 months    I discussed  the assessment and treatment plan with the patient. The patient was provided an opportunity to ask questions and all were answered. The patient agreed with the plan and demonstrated an understanding of the instructions.   The patient was advised to call back or seek an in-person evaluation if the symptoms worsen or if the condition fails to improve as anticipated.  The above assessment and management plan was discussed with the patient. The patient verbalized understanding of and has agreed to the management plan. Patient is aware to call the clinic if symptoms persist or worsen. Patient is aware when to return to the clinic for a follow-up visit. Patient educated on when it is appropriate to go to the emergency department.     I provided 23 minutes of  non face-to-face time during this encounter.    Evelina Dun, FNP

## 2021-10-06 NOTE — Patient Instructions (Signed)

## 2021-10-12 ENCOUNTER — Ambulatory Visit: Payer: Medicare PPO

## 2021-10-28 ENCOUNTER — Telehealth: Payer: Self-pay | Admitting: Family

## 2021-10-28 NOTE — Telephone Encounter (Signed)
No answer unable to leave a message for patient to call back and schedule Medicare Annual Wellness Visit (AWV) to be completed by video or phone.   Last AWV: 08/13/2020  Please schedule at anytime with [facility] Nurse Health Advisor     45 minute appointment  Any questions, please contact me at [phone]

## 2021-11-05 ENCOUNTER — Ambulatory Visit (INDEPENDENT_AMBULATORY_CARE_PROVIDER_SITE_OTHER): Payer: Medicare Other | Admitting: Family

## 2021-11-05 ENCOUNTER — Encounter: Payer: Self-pay | Admitting: Family

## 2021-11-05 DIAGNOSIS — I69993 Ataxia following unspecified cerebrovascular disease: Secondary | ICD-10-CM

## 2021-11-05 DIAGNOSIS — Z91148 Patient's other noncompliance with medication regimen for other reason: Secondary | ICD-10-CM | POA: Diagnosis not present

## 2021-11-05 DIAGNOSIS — E118 Type 2 diabetes mellitus with unspecified complications: Secondary | ICD-10-CM | POA: Diagnosis not present

## 2021-11-05 DIAGNOSIS — R2689 Other abnormalities of gait and mobility: Secondary | ICD-10-CM

## 2021-11-05 NOTE — Progress Notes (Signed)
Virtual Visit  Note Due to COVID-19 pandemic this visit was conducted virtually. This visit type was conducted due to national recommendations for restrictions regarding the COVID-19 Pandemic (e.g. social distancing, sheltering in place) in an effort to limit this patient's exposure and mitigate transmission in our community. All issues noted in this document were discussed and addressed.  A physical exam was not performed with this format.  I connected with Elbert Ewings on 11/05/21 at 11: 55 AM by telephone and verified that I am speaking with the correct person using two identifiers. Elbert Ewings is currently located at home and no one is currently with him during visit. The provider, [name], FNP is located in their office at time of visit.  I discussed the limitations, risks, security and privacy concerns of performing an evaluation and management service by telephone and the availability of in person appointments. I also discussed with the patient that there may be a patient responsible charge related to this service. The patient expressed understanding and agreed to proceed.  Mr. anvay, tennis are scheduled for a virtual visit with your provider today.    Just as we do with appointments in the office, we must obtain your consent to participate.  Your consent will be active for this visit and any virtual visit you may have with one of our providers in the next 365 days.    If you have a MyChart account, I can also send a copy of this consent to you electronically.  All virtual visits are billed to your insurance company just like a traditional visit in the office.  As this is a virtual visit, video technology does not allow for your provider to perform a traditional examination.  This may limit your provider's ability to fully assess your condition.  If your provider identifies any concerns that need to be evaluated in person or the need to arrange testing such as labs, EKG, etc,  we will make arrangements to do so.    Although advances in technology are sophisticated, we cannot ensure that it will always work on either your end or our end.  If the connection with a video visit is poor, we may have to switch to a telephone visit.  With either a video or telephone visit, we are not always able to ensure that we have a secure connection.   I need to obtain your verbal consent now.   Are you willing to proceed with your visit today?   KYLLE LALL has provided verbal consent on 11/05/2021 for a virtual visit (video or telephone).   [name], [place] 11/05/2021  11:58 AM   History and Present Illness:  Pt calls the office today with increased balance issue. He has hx of CVA and uncontrolled DM. Reports his glucose was >400 yesterday, but today is 282.  Diabetes He presents for his follow-up diabetic visit. He has type 2 diabetes mellitus. Associated symptoms include blurred vision and foot paresthesias. Symptoms are worsening. Diabetic complications include a CVA and nephropathy. Risk factors for coronary artery disease include dyslipidemia, diabetes mellitus, hypertension, male sex and sedentary lifestyle. His overall blood glucose range is >200 mg/dl.       Review of Systems  Eyes:  Positive for blurred vision.     Observations/Objective: No SOB or distress noted  Assessment and Plan: 1. Diabetes mellitus with complication (HCC) - MR Brain Wo Contrast; Future  2. Noncompliance with medications - MR Brain Wo Contrast; Future  3.  Ataxia, late effect of cerebrovascular disease - MR Brain Wo Contrast; Future  4. Balance problem - MR Brain Wo Contrast; Future  PT will come in and get labs drawn.  If balance issue continues or gets worse go to ED Start taking insulin daily!!! Strict low carb diet    I discussed the assessment and treatment plan with the patient. The patient was provided an opportunity to ask questions and all were answered. The  patient agreed with the plan and demonstrated an understanding of the instructions.   The patient was advised to call back or seek an in-person evaluation if the symptoms worsen or if the condition fails to improve as anticipated.  The above assessment and management plan was discussed with the patient. The patient verbalized understanding of and has agreed to the management plan. Patient is aware to call the clinic if symptoms persist or worsen. Patient is aware when to return to the clinic for a follow-up visit. Patient educated on when it is appropriate to go to the emergency department.   Time call ended:  12:18 pm   I provided 23 minutes of  non face-to-face time during this encounter.    Evelina Dun, FNP

## 2021-11-20 ENCOUNTER — Encounter: Payer: Self-pay | Admitting: Family

## 2021-11-20 ENCOUNTER — Other Ambulatory Visit: Payer: Self-pay | Admitting: Family

## 2021-11-20 ENCOUNTER — Ambulatory Visit (INDEPENDENT_AMBULATORY_CARE_PROVIDER_SITE_OTHER): Payer: Medicaid Other | Admitting: Family

## 2021-11-20 ENCOUNTER — Telehealth: Payer: Self-pay | Admitting: Family

## 2021-11-20 DIAGNOSIS — Z91199 Patient's noncompliance with other medical treatment and regimen due to unspecified reason: Secondary | ICD-10-CM

## 2021-11-20 DIAGNOSIS — G4700 Insomnia, unspecified: Secondary | ICD-10-CM

## 2021-11-20 NOTE — Telephone Encounter (Signed)
APPT MADE WITH [name]

## 2021-11-20 NOTE — Progress Notes (Signed)
Tried to contact patient multiple times. Not able to connect with patient, will no show patient.   Evelina Dun, FNP

## 2021-11-20 NOTE — Telephone Encounter (Signed)
Patient calling to let Alyse Low know that he has insurance now and wants to talk to nurse about medicine. Please call back.

## 2021-11-23 ENCOUNTER — Telehealth: Payer: Self-pay | Admitting: Family

## 2021-11-23 DIAGNOSIS — G8929 Other chronic pain: Secondary | ICD-10-CM

## 2021-11-23 NOTE — Telephone Encounter (Signed)
Pt called stating that he made an appt to see an orthopedic tomorrow and wanted to know if PCP has to refer him first.  Explained to pt that he would need a referral first and offered to make him an appt. Pt declined and told me to send PCP a message to call him.

## 2021-11-23 NOTE — Telephone Encounter (Signed)
Referral placed.

## 2021-11-25 DIAGNOSIS — M25511 Pain in right shoulder: Secondary | ICD-10-CM | POA: Diagnosis not present

## 2021-11-30 ENCOUNTER — Ambulatory Visit (INDEPENDENT_AMBULATORY_CARE_PROVIDER_SITE_OTHER): Payer: Medicare HMO | Admitting: Family Medicine

## 2021-11-30 ENCOUNTER — Ambulatory Visit (HOSPITAL_COMMUNITY): Admission: RE | Admit: 2021-11-30 | Payer: Medicare HMO | Source: Ambulatory Visit

## 2021-11-30 DIAGNOSIS — L237 Allergic contact dermatitis due to plants, except food: Secondary | ICD-10-CM | POA: Diagnosis not present

## 2021-11-30 MED ORDER — HYDROXYZINE HCL 10 MG PO TABS: 10.0000 mg | ORAL_TABLET | Freq: Three times a day (TID) | ORAL | 0 refills | Status: DC | PRN

## 2021-11-30 MED ORDER — PREDNISONE 20 MG PO TABS: ORAL_TABLET | ORAL | 0 refills | Status: DC

## 2021-11-30 NOTE — Progress Notes (Signed)
Telephone visit  Subjective: [id number] oak PCP: Junie Spencer, FNP Charles Pennington is a 56 y.o. male calls for telephone consult today. Patient provides verbal consent for consult held via phone.  Due to COVID-19 pandemic this visit was conducted virtually. This visit type was conducted due to national recommendations for restrictions regarding the COVID-19 Pandemic (e.g. social distancing, sheltering in place) in an effort to limit this patient's exposure and mitigate transmission in our community. All issues noted in this document were discussed and addressed.  A physical exam was not performed with this format.   Location of patient: home Location of provider: [facility] Others present for call: none  1. Poison oak Patient reports that he developed a rash after falling on a cedar tree and now both of his arms and legs are broke out.  He has taken a medicated bath but it didn't help much.  He has been using Benadryl for the diffuse itching.  He notes his blood sugars have been running 1 17-1 70 since he has been back on his medicines.   ROS: Per HPI  Allergies  Allergen Reactions   Hydrocodone-Acetaminophen Itching and Other (See Comments)    Pt is able to tolerate with Benadryl.    Gadolinium Derivatives Nausea And Vomiting    Gadavist given on 12/02/2020 without incident.  Patient stated he never had a reaction to Gadolinium before.   Past Medical History:  Diagnosis Date   Anxiety    Asthma    Diabetes mellitus    Diabetes mellitus without complication (HCC)    Hypercholesteremia    Hypertension    Hypothyroidism    Low back pain    Obesity    SOB (shortness of breath)    Stroke [facility]) age 3   Stroke (HCC)    Vertigo     Current Outpatient Medications:    albuterol (PROVENTIL) (2.5 MG/3ML) 0.083% nebulizer solution, NEBULIZE 1 VIAL EVERY 6 HOURS AS NEEDED FOR WHEEZING OR SHORTNESS OF BREATH, Disp: 180 mL, Rfl: 0   albuterol (VENTOLIN HFA) 108 (90 Base) MCG/ACT  inhaler, INHALE 2 PUFFS INTO THE LUNGS EVERY 6 (SIX) HOURS AS NEEDED FOR WHEEZING., Disp: 8.5 g, Rfl: 0   atorvastatin (LIPITOR) 80 MG tablet, Take 1 tablet (80 mg total) by mouth daily., Disp: 90 tablet, Rfl: 1   Continuous Blood Gluc Receiver (FREESTYLE LIBRE 2 READER) DEVI, Use to test blood sugar continuously. DX: E11.65, Disp: 1 each, Rfl: 0   Continuous Blood Gluc Sensor (FREESTYLE LIBRE 2 SENSOR) MISC, Use to test blood sugar continuously. DX: E11.65, Disp: 2 each, Rfl: 11   CONTOUR NEXT TEST test strip, CHECK BLOOD SUGAR UP TO TWICE DAILY, Disp: 100 strip, Rfl: 3   dapagliflozin propanediol (FARXIGA) 10 MG TABS tablet, Take 1 tablet (10 mg total) by mouth daily., Disp: 90 tablet, Rfl: 3   Dulaglutide (TRULICITY) 3 MG/0.5ML SOPN, INJECT 3MG  (0.5ML) ONCE WEEKLY AS DIRECTED, Disp: 6 mL, Rfl: 0   Fluticasone-Umeclidin-Vilant (TRELEGY ELLIPTA) 100-62.5-25 MCG/ACT AEPB, INHALE 1 ONCE A DAY AS DIRECTED, Disp: 60 each, Rfl: 2   insulin degludec (TRESIBA FLEXTOUCH) 200 UNIT/ML FlexTouch Pen, Inject 26 Units into the skin at bedtime., Disp: 18 mL, Rfl: 0   Insulin Pen Needle (ULTICARE MICRO PEN NEEDLES) 32G X 4 MM MISC, USE AS DIRECTED TWICE A DAY, Disp: 100 each, Rfl: 3   levothyroxine (SYNTHROID) 200 MCG tablet, Take 1 tablet (200 mcg total) by mouth daily., Disp: 90 tablet, Rfl: 2   lisinopril (ZESTRIL)  10 MG tablet, Take 1 tablet (10 mg total) by mouth daily., Disp: 90 tablet, Rfl: 3   metFORMIN (GLUCOPHAGE-XR) 750 MG 24 hr tablet, Take 2 tablets (1,500 mg total) by mouth daily with breakfast., Disp: 180 tablet, Rfl: 1   omeprazole (PRILOSEC) 20 MG capsule, TAKE (1) CAPSULE DAILY, Disp: 90 capsule, Rfl: 0   PARoxetine (PAXIL) 40 MG tablet, Take 1 tablet (40 mg total) by mouth daily., Disp: 90 tablet, Rfl: 1   pregabalin (LYRICA) 150 MG capsule, TAKE UP TO 2 CAPSULES EVERY MORNING & 1 CAPSULE EVERY EVENING, Disp: 90 capsule, Rfl: 2   zolpidem (AMBIEN) 10 MG tablet, Take 1 tablet (10 mg total) by  mouth at bedtime., Disp: 30 tablet, Rfl: 2  Assessment/ Plan: 56 y.o. male   Allergic contact dermatitis due to plants, except food - Plan: predniSONE (DELTASONE) 20 MG tablet, hydrOXYzine (ATARAX) 10 MG tablet  We will give oral prednisone given diffuse nature of rash but discussed that this would likely raise his blood sugar.  In efforts not to have blood sugar go out of control again I have advised him to go ahead and go up to 28 units of Tresiba nightly and contact our office if blood sugars remain elevated  Start time: 11:29am End time: 11:34a  Total time spent on patient care (including telephone call/ virtual visit): 5 minutes  [place], [place] [phone]

## 2021-12-07 ENCOUNTER — Telehealth: Payer: Self-pay | Admitting: Family

## 2021-12-07 NOTE — Telephone Encounter (Signed)
Patient has appt tomorrow needs Ambien refill.

## 2021-12-08 ENCOUNTER — Ambulatory Visit: Payer: Medicare HMO | Admitting: Family

## 2021-12-08 ENCOUNTER — Encounter: Payer: Self-pay | Admitting: Family

## 2021-12-14 ENCOUNTER — Telehealth: Payer: Self-pay | Admitting: Family

## 2021-12-14 NOTE — Telephone Encounter (Signed)
APPT MADE

## 2021-12-14 NOTE — Telephone Encounter (Signed)
Patient wants to talk to [facility] nurse about his arm. No other info given. Please call back and advise.

## 2021-12-17 ENCOUNTER — Ambulatory Visit (INDEPENDENT_AMBULATORY_CARE_PROVIDER_SITE_OTHER): Payer: Medicare HMO | Admitting: Family

## 2021-12-17 ENCOUNTER — Encounter: Payer: Self-pay | Admitting: Family

## 2021-12-17 VITALS — BP 113/76 | HR 101 | Temp 98.0°F | Ht 68.0 in | Wt 199.0 lb

## 2021-12-17 DIAGNOSIS — J441 Chronic obstructive pulmonary disease with (acute) exacerbation: Secondary | ICD-10-CM

## 2021-12-17 DIAGNOSIS — F22 Delusional disorders: Secondary | ICD-10-CM

## 2021-12-17 DIAGNOSIS — E039 Hypothyroidism, unspecified: Secondary | ICD-10-CM

## 2021-12-17 DIAGNOSIS — Z794 Long term (current) use of insulin: Secondary | ICD-10-CM

## 2021-12-17 DIAGNOSIS — M5440 Lumbago with sciatica, unspecified side: Secondary | ICD-10-CM

## 2021-12-17 DIAGNOSIS — E1159 Type 2 diabetes mellitus with other circulatory complications: Secondary | ICD-10-CM

## 2021-12-17 DIAGNOSIS — M545 Low back pain, unspecified: Secondary | ICD-10-CM

## 2021-12-17 DIAGNOSIS — E669 Obesity, unspecified: Secondary | ICD-10-CM

## 2021-12-17 DIAGNOSIS — E1142 Type 2 diabetes mellitus with diabetic polyneuropathy: Secondary | ICD-10-CM | POA: Diagnosis not present

## 2021-12-17 DIAGNOSIS — I152 Hypertension secondary to endocrine disorders: Secondary | ICD-10-CM | POA: Diagnosis not present

## 2021-12-17 DIAGNOSIS — K219 Gastro-esophageal reflux disease without esophagitis: Secondary | ICD-10-CM

## 2021-12-17 DIAGNOSIS — E1169 Type 2 diabetes mellitus with other specified complication: Secondary | ICD-10-CM

## 2021-12-17 DIAGNOSIS — Z91148 Patient's other noncompliance with medication regimen for other reason: Secondary | ICD-10-CM

## 2021-12-17 DIAGNOSIS — F17200 Nicotine dependence, unspecified, uncomplicated: Secondary | ICD-10-CM | POA: Diagnosis not present

## 2021-12-17 DIAGNOSIS — Z8673 Personal history of transient ischemic attack (TIA), and cerebral infarction without residual deficits: Secondary | ICD-10-CM | POA: Diagnosis not present

## 2021-12-17 DIAGNOSIS — F331 Major depressive disorder, recurrent, moderate: Secondary | ICD-10-CM

## 2021-12-17 DIAGNOSIS — E119 Type 2 diabetes mellitus without complications: Secondary | ICD-10-CM | POA: Diagnosis not present

## 2021-12-17 DIAGNOSIS — G4700 Insomnia, unspecified: Secondary | ICD-10-CM

## 2021-12-17 DIAGNOSIS — E785 Hyperlipidemia, unspecified: Secondary | ICD-10-CM

## 2021-12-17 DIAGNOSIS — G8929 Other chronic pain: Secondary | ICD-10-CM

## 2021-12-17 DIAGNOSIS — F411 Generalized anxiety disorder: Secondary | ICD-10-CM | POA: Diagnosis not present

## 2021-12-17 DIAGNOSIS — L089 Local infection of the skin and subcutaneous tissue, unspecified: Secondary | ICD-10-CM

## 2021-12-17 DIAGNOSIS — F1910 Other psychoactive substance abuse, uncomplicated: Secondary | ICD-10-CM

## 2021-12-17 LAB — BAYER DCA HB A1C WAIVED: HB A1C (BAYER DCA - WAIVED): 12.7 % — ABNORMAL HIGH (ref 4.8–5.6)

## 2021-12-17 MED ORDER — SULFAMETHOXAZOLE-TRIMETHOPRIM 800-160 MG PO TABS: 1.0000 | ORAL_TABLET | Freq: Two times a day (BID) | ORAL | 0 refills | Status: DC

## 2021-12-17 MED ORDER — ZOLPIDEM TARTRATE 10 MG PO TABS: 10.0000 mg | ORAL_TABLET | Freq: Every day | ORAL | 2 refills | Status: DC

## 2021-12-17 NOTE — Patient Instructions (Signed)
Folliculitis  Folliculitis occurs when hair follicles become inflamed. A hair follicle is a tiny opening in your skin where your hair grows from. This condition often occurs on the scalp, thighs, legs, back, and buttocks but can happen anywhere on the body. What are the causes? A common cause of this condition is an infection from bacteria. The type of folliculitis caused by bacteria can last a long time or go away and come back. The bacteria can live anywhere on your skin. They are often found in the nostrils. Other causes may include: An infection from a fungus. An infection from a virus. Your skin touching some chemicals, such as oils and tars. Shaving or waxing. Greasy ointments or creams put on the skin. What increases the risk? You are more likely to develop this condition if: Your body has a weak disease-fighting system (immune system). You have diabetes. You are obese. What are the signs or symptoms? Symptoms of this condition include: Redness. Soreness. Swelling. Itching. Small white or yellow, itchy spots filled with pus (pustules) that appear over a red area. If the infection goes deep into the follicle, these may turn into a boil (furuncle). A group of boils (carbuncle). These tend to form in hairy, sweaty areas of the body. How is this diagnosed? This condition is diagnosed with a skin exam. Your health care provider may take a sample of one of the pustules or boils to test in a lab. How is this treated? This condition may be treated by: Putting a warm, wet cloth (warm compress) on the affected areas. Taking antibiotics or applying them to the skin. Applying or bathing with a solution that kills germs (antiseptic). Taking an over-the-counter medicine. This can help with itching. Having a procedure to drain pustules or boils. This may be done if a pustule or boil contains a lot of pus or fluid. Having laser hair removal. This may be done when the condition lasts for a  long time. Follow these instructions at home: Managing pain and swelling  If directed, apply heat to the affected area as often as told by your health care provider. Use the heat source that your health care provider recommends, such as a moist heat pack or a heating pad. Place a towel between your skin and the heat source. Leave the heat on for 20-30 minutes. If your skin turns bright red, remove the heat right away to prevent burns. The risk of burns is higher if you cannot feel pain, heat, or cold. General instructions Take over-the-counter and prescription medicines only as told by your health care provider. If you were prescribed antibiotics, take or apply them as told by your health care provider. Do not stop using the antibiotic even if you start to feel better. Check your irritated area every day for signs of infection. Check for: More redness, swelling, or pain. Fluid or blood. Warmth. Pus or a bad smell. Do not shave irritated skin. Keep all follow-up visits. Your health care provider will check if the treatments are helping. Contact a health care provider if: You have a fever. You have any signs of infection. Red streaks are spreading from the affected area. This information is not intended to replace advice given to you by your health care provider. Make sure you discuss any questions you have with your health care provider. Document Revised: 07/07/2021 Document Reviewed: 07/07/2021 Elsevier Patient Education  2023 Elsevier Inc.  

## 2021-12-17 NOTE — Progress Notes (Signed)
Subjective:    Patient ID: Charles Pennington, male    DOB: 1965-11-07, 56 y.o.   MRN: [id number]  Chief Complaint  Patient presents with   Medical Management of Chronic Issues   Fall    Arm pain   PT presents to the office today with chronic follow up. He is noncompliant with his medications. He has not taken his medications in regularly  because he was "running from the law".    He is followed by Neurosurgeon for chronic back pain. He is suppose to have back surgery, but has not been able to get his blood glucose stable.    He has COPD and continues to smoke a pack a day. Has SOB with exertion.    He has a hx of substance abuse and admits to taking adderall.   He reports a pruritic rash with "small white worms" coming out. He has picked the skin on his arms and neck. He reports he has been sleeping on the ground and outside a lot.  Diabetes He presents for his follow-up diabetic visit. He has type 2 diabetes mellitus. Hypoglycemia symptoms include nervousness/anxiousness. Associated symptoms include fatigue. Pertinent negatives for diabetes include no blurred vision and no foot paresthesias. Symptoms are stable. Diabetic complications include a CVA, heart disease and nephropathy. Risk factors for coronary artery disease include dyslipidemia, diabetes mellitus, hypertension, male sex and sedentary lifestyle. He is following a generally unhealthy diet. His overall blood glucose range is >200 mg/dl.  Hypertension This is a chronic problem. The current episode started more than 1 year ago. The problem has been resolved since onset. Associated symptoms include anxiety and malaise/fatigue. Pertinent negatives include no blurred vision, peripheral edema or shortness of breath. Risk factors for coronary artery disease include dyslipidemia, diabetes mellitus, obesity, male gender, smoking/tobacco exposure and sedentary lifestyle. The current treatment provides moderate improvement. Hypertensive  end-organ damage includes CVA. Identifiable causes of hypertension include a thyroid problem.  Gastroesophageal Reflux He complains of belching, heartburn and a hoarse voice. This is a chronic problem. The current episode started more than 1 year ago. The problem occurs occasionally. Associated symptoms include fatigue. Risk factors include obesity. He has tried a PPI for the symptoms. The treatment provided moderate relief.  Thyroid Problem Presents for follow-up visit. Symptoms include anxiety, depressed mood, fatigue and hoarse voice. Patient reports no constipation or dry skin. The symptoms have been stable. His past medical history is significant for hyperlipidemia.  Hyperlipidemia This is a chronic problem. The current episode started more than 1 year ago. The problem is controlled. Recent lipid tests were reviewed and are normal. Exacerbating diseases include obesity. Pertinent negatives include no shortness of breath. Current antihyperlipidemic treatment includes statins. The current treatment provides moderate improvement of lipids. Risk factors for coronary artery disease include dyslipidemia, diabetes mellitus, male sex, hypertension and a sedentary lifestyle.  Anxiety Presents for follow-up visit. Symptoms include decreased concentration, depressed mood, excessive worry, irritability and nervous/anxious behavior. Patient reports no shortness of breath. Symptoms occur constantly. The severity of symptoms is causing significant distress and moderate.    Depression        This is a chronic problem.  The current episode started more than 1 year ago.   The onset quality is gradual.   The problem occurs intermittently.  Associated symptoms include decreased concentration, fatigue, helplessness, hopelessness and sad.  Past treatments include SSRIs - Selective serotonin reuptake inhibitors.  Past medical history includes thyroid problem and anxiety.   Back Pain  This is a chronic problem. The  current episode started more than 1 year ago. The problem occurs intermittently. The problem has been waxing and waning since onset. The pain is present in the lumbar spine. The quality of the pain is described as aching. The pain is moderate.      Review of Systems  Constitutional:  Positive for fatigue, irritability and malaise/fatigue.  HENT:  Positive for hoarse voice.   Eyes:  Negative for blurred vision.  Respiratory:  Negative for shortness of breath.   Gastrointestinal:  Positive for heartburn. Negative for constipation.  Musculoskeletal:  Positive for back pain.  Psychiatric/Behavioral:  Positive for decreased concentration and depression. The patient is nervous/anxious.        Objective:   Physical Exam Vitals reviewed.  Constitutional:      General: He is not in acute distress.    Appearance: He is well-developed.  HENT:     Head: Normocephalic.     Right Ear: Tympanic membrane normal.     Left Ear: Tympanic membrane normal.  Eyes:     General:        Right eye: No discharge.        Left eye: No discharge.     Pupils: Pupils are equal, round, and reactive to light.  Neck:     Thyroid: No thyromegaly.  Cardiovascular:     Rate and Rhythm: Normal rate and regular rhythm.     Heart sounds: Normal heart sounds. No murmur heard. Pulmonary:     Effort: Pulmonary effort is normal. No respiratory distress.     Breath sounds: Rhonchi present. No wheezing.  Abdominal:     General: Bowel sounds are normal. There is no distension.     Palpations: Abdomen is soft.     Tenderness: There is no abdominal tenderness.  Musculoskeletal:        General: No tenderness. Normal range of motion.     Cervical back: Normal range of motion and neck supple.  Skin:    General: Skin is warm and dry.     Findings: No erythema or rash.          Comments: Multiple scabbed areas on bilateral arms and neck  Neurological:     Mental Status: He is alert and oriented to person, place, and  time.     Cranial Nerves: No cranial nerve deficit.     Deep Tendon Reflexes: Reflexes are normal and symmetric.  Psychiatric:        Mood and Affect: Mood is anxious.        Behavior: Behavior is hyperactive.        Thought Content: Thought content is paranoid.        Judgment: Judgment is impulsive and inappropriate.       BP 113/76   Pulse (!) 101   Temp 98 F (36.7 C) (Temporal)   Ht _0  (1.727 m)   Wt 199 lb (90.3 kg)   BMI 30.26 kg/m      Assessment & Plan:   Charles Pennington comes in today with chief complaint of Medical Management of Chronic Issues and Fall (Arm pain)   Diagnosis and orders addressed:  1. Hypothyroidism, unspecified type - TSH - CMP14+EGFR - CBC with Differential/Platelet  2. Type 2 diabetes mellitus treated with insulin (HCC)  - Bayer DCA Hb A1c Waived - CMP14+EGFR - Microalbumin / creatinine urine ratio - CBC with Differential/Platelet  3. Hypertension associated with diabetes ([place]) - CMP14+EGFR -  CBC with Differential/Platelet  4. Diabetic polyneuropathy associated with type 2 diabetes mellitus (HCC) - CMP14+EGFR - CBC with Differential/Platelet  5. Hyperlipidemia associated with type 2 diabetes mellitus (HCC) - CBC with Differential/Platelet  6. SMOKER - CBC with Differential/Platelet  7. History of CVA (cerebrovascular accident) - CBC with Differential/Platelet  8. Gastroesophageal reflux disease, unspecified whether esophagitis present - CBC with Differential/Platelet  9. GAD (generalized anxiety disorder) - CBC with Differential/Platelet  10. Moderate episode of recurrent major depressive disorder (HCC) - CBC with Differential/Platelet  11. Chronic low back pain with sciatica, sciatica laterality unspecified, unspecified back pain laterality - CBC with Differential/Platelet  12. Obesity (BMI 30-39.9) - CBC with Differential/Platelet  13. Insomnia, unspecified type - CBC with Differential/Platelet  14.  Noncompliance with medications - CBC with Differential/Platelet  15. Substance abuse ([place])  - CBC with Differential/Platelet  16. Hyperlipidemia, unspecified hyperlipidemia type - CBC with Differential/Platelet  17. COPD with acute exacerbation (HCC) - CBC with Differential/Platelet  18. Chronic midline low back pain, unspecified whether sciatica present - CBC with Differential/Platelet  19. Skin infection Bactrim BID Quit picking area, keep clean and dry - sulfamethoxazole-trimethoprim (BACTRIM DS) 800-160 MG tablet; Take 1 tablet by mouth 2 (two) times daily.  Dispense: 14 tablet; Refill: 0  20. Paranoid ([place])   Labs pending Health Maintenance reviewed Diet and exercise encouraged  Follow up plan: 1 month   Evelina Dun, FNP

## 2021-12-18 ENCOUNTER — Other Ambulatory Visit: Payer: Self-pay | Admitting: Family

## 2021-12-18 DIAGNOSIS — E118 Type 2 diabetes mellitus with unspecified complications: Secondary | ICD-10-CM

## 2021-12-18 DIAGNOSIS — E1142 Type 2 diabetes mellitus with diabetic polyneuropathy: Secondary | ICD-10-CM

## 2021-12-18 LAB — MICROALBUMIN / CREATININE URINE RATIO
Creatinine, Urine: 26.2 mg/dL
Microalb/Creat Ratio: <11 mg/g creat (ref 0–29)
Microalbumin, Urine: <3 ug/mL

## 2021-12-18 LAB — CMP14+EGFR
ALT: 24 IU/L (ref 0–44)
AST: 22 IU/L (ref 0–40)
Albumin/Globulin Ratio: 1.8 (ref 1.2–2.2)
Albumin: 4.1 g/dL (ref 3.8–4.9)
Alkaline Phosphatase: 84 IU/L (ref 44–121)
BUN/Creatinine Ratio: 13 (ref 9–20)
BUN: 10 mg/dL (ref 6–24)
Bilirubin Total: 0.4 mg/dL (ref 0.0–1.2)
CO2: 29 mmol/L (ref 20–29)
Calcium: 9.1 mg/dL (ref 8.7–10.2)
Chloride: 97 mmol/L (ref 96–106)
Creatinine, Ser: 0.77 mg/dL (ref 0.76–1.27)
Globulin, Total: 2.3 g/dL (ref 1.5–4.5)
Glucose: 231 mg/dL — ABNORMAL HIGH (ref 70–99)
Potassium: 4.2 mmol/L (ref 3.5–5.2)
Sodium: 140 mmol/L (ref 134–144)
Total Protein: 6.4 g/dL (ref 6.0–8.5)
eGFR: 106 mL/min/{1.73_m2} (ref >59)

## 2021-12-18 LAB — CBC WITH DIFFERENTIAL/PLATELET
Basophils Absolute: 0.1 10*3/uL (ref 0.0–0.2)
Basos: 1 %
EOS (ABSOLUTE): 0.3 10*3/uL (ref 0.0–0.4)
Eos: 3 %
Hematocrit: 47.4 % (ref 37.5–51.0)
Hemoglobin: 16.1 g/dL (ref 13.0–17.7)
Immature Grans (Abs): 0 10*3/uL (ref 0.0–0.1)
Immature Granulocytes: 0 %
Lymphocytes Absolute: 2.8 10*3/uL (ref 0.7–3.1)
Lymphs: 30 %
MCH: 31.9 pg (ref 26.6–33.0)
MCHC: 34 g/dL (ref 31.5–35.7)
MCV: 94 fL (ref 79–97)
Monocytes Absolute: 0.5 10*3/uL (ref 0.1–0.9)
Monocytes: 5 %
Neutrophils Absolute: 5.8 10*3/uL (ref 1.4–7.0)
Neutrophils: 61 %
Platelets: 229 10*3/uL (ref 150–450)
RBC: 5.05 x10E6/uL (ref 4.14–5.80)
RDW: 12.5 % (ref 11.6–15.4)
WBC: 9.6 10*3/uL (ref 3.4–10.8)

## 2021-12-18 LAB — TSH: TSH: 7.57 u[IU]/mL — ABNORMAL HIGH (ref 0.450–4.500)

## 2021-12-18 MED ORDER — TRESIBA FLEXTOUCH 200 UNIT/ML ~~LOC~~ SOPN: 30.0000 [IU] | PEN_INJECTOR | Freq: Every evening | SUBCUTANEOUS | 1 refills | Status: DC

## 2021-12-23 ENCOUNTER — Telehealth: Payer: Self-pay | Admitting: Family

## 2021-12-23 NOTE — Telephone Encounter (Signed)
Bactrim is a pretty good antibiotic.  If he is not doing better then he may need to come in and be seen, it may look different or may be something different.  Still finish the full course of the antibiotic

## 2021-12-23 NOTE — Telephone Encounter (Signed)
Please see office visit note from 12/17/21 appointment with [facility].  Patient was given Bactrim.

## 2021-12-23 NOTE — Telephone Encounter (Signed)
Patient is still breaking out on his arm. Wants to know what else he can do to treat. Seen 11/2 for this issue. Please call back and advise,

## 2021-12-24 ENCOUNTER — Other Ambulatory Visit: Payer: Self-pay | Admitting: *Deleted

## 2021-12-24 MED ORDER — ACCU-CHEK SOFTCLIX LANCETS MISC: 3 refills | Status: AC

## 2021-12-24 MED ORDER — ACCU-CHEK GUIDE VI STRP: ORAL_STRIP | 3 refills | Status: AC

## 2021-12-24 MED ORDER — ACCU-CHEK GUIDE CONTROL VI LIQD: 1 refills | Status: AC

## 2021-12-24 MED ORDER — ACCU-CHEK GUIDE ME W/DEVICE KIT: PACK | 0 refills | Status: AC

## 2021-12-24 MED ORDER — BD SWAB SINGLE USE REGULAR PADS: MEDICATED_PAD | 3 refills | Status: AC

## 2021-12-25 NOTE — Telephone Encounter (Signed)
Patient aware and verbalized understanding. °

## 2022-01-05 DIAGNOSIS — M25511 Pain in right shoulder: Secondary | ICD-10-CM | POA: Diagnosis not present

## 2022-01-06 ENCOUNTER — Telehealth: Payer: Self-pay | Admitting: Family

## 2022-01-06 NOTE — Telephone Encounter (Signed)
Lmtcb.

## 2022-01-06 NOTE — Telephone Encounter (Signed)
Pt needs antibiotic refill sent in to pharmacy. Says he feels about 80% better but not 100%.

## 2022-01-11 ENCOUNTER — Telehealth: Payer: Self-pay | Admitting: Family

## 2022-01-11 DIAGNOSIS — E119 Type 2 diabetes mellitus without complications: Secondary | ICD-10-CM

## 2022-01-11 NOTE — Telephone Encounter (Signed)
Pt returned missed call. Please call pt back 

## 2022-01-11 NOTE — Telephone Encounter (Signed)
Patient wants to talk to [facility] or nurse about getting the meter to check his blood sugar. Would like the one that sticks to his arm. Please call back and advise.

## 2022-01-11 NOTE — Telephone Encounter (Signed)
Called and spoke with patient gave him [place] recommendations.

## 2022-01-12 MED ORDER — FREESTYLE LIBRE 2 READER DEVI: 0 refills | Status: AC

## 2022-01-12 MED ORDER — FREESTYLE LIBRE 2 SENSOR MISC: 11 refills | Status: AC

## 2022-01-12 NOTE — Telephone Encounter (Signed)
NA

## 2022-01-12 NOTE — Telephone Encounter (Signed)
Patient advised in another phone call

## 2022-01-12 NOTE — Telephone Encounter (Signed)
Libre Prescription sent to pharmacy    

## 2022-01-13 ENCOUNTER — Telehealth: Payer: Self-pay | Admitting: Family Medicine

## 2022-01-13 ENCOUNTER — Ambulatory Visit (INDEPENDENT_AMBULATORY_CARE_PROVIDER_SITE_OTHER): Payer: Medicare HMO | Admitting: Family Medicine

## 2022-01-13 ENCOUNTER — Encounter: Payer: Self-pay | Admitting: Family Medicine

## 2022-01-13 VITALS — BP 120/63 | HR 84 | Temp 96.0°F | Ht 68.0 in | Wt 202.7 lb

## 2022-01-13 DIAGNOSIS — L0291 Cutaneous abscess, unspecified: Secondary | ICD-10-CM

## 2022-01-13 DIAGNOSIS — L0211 Cutaneous abscess of neck: Secondary | ICD-10-CM | POA: Diagnosis not present

## 2022-01-13 MED ORDER — SULFAMETHOXAZOLE-TRIMETHOPRIM 800-160 MG PO TABS: 1.0000 | ORAL_TABLET | Freq: Two times a day (BID) | ORAL | 0 refills | Status: AC

## 2022-01-13 NOTE — Progress Notes (Signed)
Subjective:    Patient ID: Charles Pennington, male    DOB: 09/30/1965, 56 y.o.   MRN: [id number]   Chief Complaint: Cyst (Back of neck x 2 weeks )   Pt presents today for bump on back of neck that popped up 2 weeks ago; pt reports he had someone try to cut it open with a strait blade; denies fever or chills. Also has bump on R knee.       Review of Systems  Constitutional:  Negative for activity change, chills, diaphoresis, fatigue, fever and unexpected weight change.  Respiratory:  Negative for cough and shortness of breath.   Cardiovascular:  Negative for chest pain.  Genitourinary:  Negative for decreased urine volume and difficulty urinating.  Skin:  Positive for color change and wound.       bump on back of neck and R knee  Neurological:  Negative for weakness.  Psychiatric/Behavioral:  Negative for confusion.   All other systems reviewed and are negative.      Objective:   Physical Exam Vitals and nursing note reviewed.  Constitutional:      Appearance: He is not toxic-appearing.  HENT:     Head: Normocephalic and atraumatic.     Mouth/Throat:     Mouth: Mucous membranes are moist.  Eyes:     Conjunctiva/sclera: Conjunctivae normal.     Pupils: Pupils are equal, round, and reactive to light.  Cardiovascular:     Rate and Rhythm: Normal rate and regular rhythm.     Heart sounds: Normal heart sounds.  Pulmonary:     Effort: Pulmonary effort is normal. No respiratory distress.     Breath sounds: Normal breath sounds.  Musculoskeletal:        General: Normal range of motion.  Skin:    General: Skin is warm and dry.     Capillary Refill: Capillary refill takes less than 2 seconds.     Findings: Abscess present.          Comments: 2cm round, erythematous abscess to mid posterior neck with surrounding erythema and swelling; partially scabbed in center, tender to palpation, no warmth noted  2.5cm round, erythematous abscess to anterior R knee with surrounding  erythema and swelling; tender to palpation, no warmth noted   Neurological:     General: No focal deficit present.     Mental Status: He is alert and oriented to person, place, and time.  Psychiatric:        Mood and Affect: Mood normal.        Behavior: Behavior normal. Behavior is cooperative.      I & D  Date/Time: 01/13/2022 8:59 AM  Performed by: Charles Masters, FNP Authorized by: Charles Masters, FNP   Consent:    Consent obtained:  Written   Consent given by:  Patient   Risks, benefits, and alternatives were discussed: yes     Risks discussed:  Bleeding, incomplete drainage, pain and infection Location:    Type:  Abscess   Size:  2cm in diameter   Location:  Neck   Neck location: mid posterior. Pre-procedure details:    Skin preparation:  Povidone-iodine Sedation:    Sedation type:  None Anesthesia:    Anesthesia method:  Topical application   Topical anesthesia: spray lidocaine. Procedure type:    Complexity:  Simple Procedure details:    Needle aspiration: no     Incision types:  Single straight   Incision depth:  Dermal  Scalpel blade:  11   Wound management:  Probed and deloculated and irrigated with saline   Drainage:  Bloody and purulent   Drainage amount:  Copious   Wound treatment:  Wound left open   Packing materials:  None Post-procedure details:    Procedure completion:  Tolerated well, no immediate complications I & D  Date/Time: 01/13/2022 9:04 AM  Performed by: Charles Gouty, FNP Authorized by: Charles Gouty, FNP   Consent:    Consent obtained:  Written   Consent given by:  Patient   Risks, benefits, and alternatives were discussed: yes     Risks discussed:  Bleeding, incomplete drainage, pain and infection   Alternatives discussed:  No treatment Location:    Type:  Abscess   Size:  25cm in diameter   Location:  Lower extremity   Lower extremity location:  Knee   Knee location:  R knee Pre-procedure details:    Skin  preparation:  Povidone-iodine Sedation:    Sedation type:  None Anesthesia:    Anesthesia method:  Topical application   Topical anesthesia: spray lidocaine. Procedure type:    Complexity:  Simple Procedure details:    Needle aspiration: no     Incision types:  Single straight   Incision depth:  Dermal   Scalpel blade:  11   Wound management:  Probed and deloculated and irrigated with saline   Drainage:  Bloody and purulent   Drainage amount:  Copious   Wound treatment:  Wound left open   Packing materials:  None Post-procedure details:    Procedure completion:  Tolerated well, no immediate complications  BP 123456   Pulse 84   Temp (!) 96 F (35.6 C) (Temporal)   Ht 5\' 8"  (1.727 m)   Wt 202 lb 11.2 oz (91.9 kg)   SpO2 97%   BMI 30.82 kg/m       Assessment & Plan:   Charles Pennington in today with chief complaint of Abscess (Neck x 2 weeks)   1. Abscess Abscess on neck and R knee as described above.  I&D in office; pt tolerated well. Wound care discussed in detail; antibiotics as prescribed. - sulfamethoxazole-trimethoprim (BACTRIM DS) 800-160 MG tablet; Take 1 tablet by mouth 2 (two) times daily for 10 days.  Dispense: 20 tablet; Refill: 0    The above assessment and management plan was discussed with the patient. The patient verbalized understanding of and has agreed to the management plan. Patient is aware to call the clinic if symptoms persist or worsen. Patient is aware when to return to the clinic for a follow-up visit. Patient educated on when it is appropriate to go to the emergency department.   Charles Leyden, FNP student   I personally was present during the history, physical exam, and medical decision-making activities of this visit and have verified that the services and findings are accurately documented in the nurse practitioner student's note.  Charles Pouch, FNP-C [facility] [address] [place], [place]  [id number] [phone]

## 2022-01-13 NOTE — Telephone Encounter (Signed)
[employer] is processing your PA request and will respond shortly with next steps. You may close this dialog, return to your dashboard, and perform other tasks. To check for an update later, open this request again from your dashboard.  If you need assistance, please chat with [employer]eds or call us at [phone].

## 2022-01-14 ENCOUNTER — Telehealth: Payer: Self-pay | Admitting: Family

## 2022-01-14 NOTE — Telephone Encounter (Signed)
PA was done patient aware it was sent in. Waiting on PA done

## 2022-01-19 DIAGNOSIS — M25511 Pain in right shoulder: Secondary | ICD-10-CM | POA: Diagnosis not present

## 2022-01-19 DIAGNOSIS — M75121 Complete rotator cuff tear or rupture of right shoulder, not specified as traumatic: Secondary | ICD-10-CM | POA: Diagnosis not present

## 2022-01-21 NOTE — Telephone Encounter (Signed)
NA response.

## 2022-01-22 ENCOUNTER — Ambulatory Visit: Payer: Medicare HMO | Admitting: Family Medicine

## 2022-02-04 ENCOUNTER — Telehealth: Payer: Self-pay | Admitting: *Deleted

## 2022-02-04 NOTE — Telephone Encounter (Signed)
Unable to reach pt. Received ppw from [employer] for Diabetic shoes. Trying to reach pt, he will NTBS he has not had a detailed diabetic foot exam for provider to sign paperwork. I faxed ppw back with this information on it.

## 2022-02-24 ENCOUNTER — Other Ambulatory Visit: Payer: Self-pay | Admitting: Family

## 2022-03-15 ENCOUNTER — Ambulatory Visit (INDEPENDENT_AMBULATORY_CARE_PROVIDER_SITE_OTHER): Payer: Medicare HMO | Admitting: Family

## 2022-03-15 ENCOUNTER — Encounter: Payer: Self-pay | Admitting: Family

## 2022-03-15 VITALS — BP 102/67 | HR 117 | Temp 98.4°F | Ht 68.0 in | Wt 202.0 lb

## 2022-03-15 DIAGNOSIS — Z794 Long term (current) use of insulin: Secondary | ICD-10-CM | POA: Diagnosis not present

## 2022-03-15 DIAGNOSIS — I4892 Unspecified atrial flutter: Secondary | ICD-10-CM

## 2022-03-15 DIAGNOSIS — F411 Generalized anxiety disorder: Secondary | ICD-10-CM

## 2022-03-15 DIAGNOSIS — E1159 Type 2 diabetes mellitus with other circulatory complications: Secondary | ICD-10-CM

## 2022-03-15 DIAGNOSIS — E785 Hyperlipidemia, unspecified: Secondary | ICD-10-CM

## 2022-03-15 DIAGNOSIS — E039 Hypothyroidism, unspecified: Secondary | ICD-10-CM | POA: Diagnosis not present

## 2022-03-15 DIAGNOSIS — E119 Type 2 diabetes mellitus without complications: Secondary | ICD-10-CM | POA: Diagnosis not present

## 2022-03-15 DIAGNOSIS — J441 Chronic obstructive pulmonary disease with (acute) exacerbation: Secondary | ICD-10-CM

## 2022-03-15 DIAGNOSIS — E1169 Type 2 diabetes mellitus with other specified complication: Secondary | ICD-10-CM | POA: Diagnosis not present

## 2022-03-15 DIAGNOSIS — E118 Type 2 diabetes mellitus with unspecified complications: Secondary | ICD-10-CM

## 2022-03-15 DIAGNOSIS — R2689 Other abnormalities of gait and mobility: Secondary | ICD-10-CM

## 2022-03-15 DIAGNOSIS — F331 Major depressive disorder, recurrent, moderate: Secondary | ICD-10-CM | POA: Diagnosis not present

## 2022-03-15 DIAGNOSIS — I152 Hypertension secondary to endocrine disorders: Secondary | ICD-10-CM | POA: Diagnosis not present

## 2022-03-15 DIAGNOSIS — R55 Syncope and collapse: Secondary | ICD-10-CM | POA: Diagnosis not present

## 2022-03-15 DIAGNOSIS — E1142 Type 2 diabetes mellitus with diabetic polyneuropathy: Secondary | ICD-10-CM | POA: Diagnosis not present

## 2022-03-15 DIAGNOSIS — R0902 Hypoxemia: Secondary | ICD-10-CM

## 2022-03-15 LAB — BAYER DCA HB A1C WAIVED: HB A1C (BAYER DCA - WAIVED): 5.5 % (ref 4.8–5.6)

## 2022-03-15 MED ORDER — TRULICITY 3 MG/0.5ML ~~LOC~~ SOAJ: SUBCUTANEOUS | 0 refills | Status: DC

## 2022-03-15 MED ORDER — TRESIBA FLEXTOUCH 200 UNIT/ML ~~LOC~~ SOPN: 30.0000 [IU] | PEN_INJECTOR | Freq: Every evening | SUBCUTANEOUS | 1 refills | Status: DC

## 2022-03-15 MED ORDER — PAROXETINE HCL 40 MG PO TABS: 40.0000 mg | ORAL_TABLET | Freq: Every day | ORAL | 1 refills | Status: DC

## 2022-03-15 NOTE — Patient Instructions (Signed)
Dizziness Dizziness is a common problem. It is a feeling of unsteadiness or light-headedness. You may feel like you are about to faint. Dizziness can lead to injury if you stumble or fall. Anyone can become dizzy, but dizziness is more common in older adults. This condition can be caused by a number of things, including medicines, dehydration, or illness. Follow these instructions at home: Eating and drinking  Drink enough fluid to keep your urine pale yellow. This helps to keep you from becoming dehydrated. Try to drink more clear fluids, such as water. Do not drink alcohol. Limit your caffeine intake if told to do so by your health care provider. Check ingredients and nutrition facts to see if a food or beverage contains caffeine. Limit your salt (sodium) intake if told to do so by your health care provider. Check ingredients and nutrition facts to see if a food or beverage contains sodium. Activity  Avoid making quick movements. Rise slowly from chairs and steady yourself until you feel okay. In the morning, first sit up on the side of the bed. When you feel okay, stand slowly while you hold onto something until you know that your balance is good. If you need to stand in one place for a long time, move your legs often. Tighten and relax the muscles in your legs while you are standing. Do not drive or use machinery if you feel dizzy. Avoid bending down if you feel dizzy. Place items in your home so that they are easy for you to reach without leaning over. Lifestyle Do not use any products that contain nicotine or tobacco. These products include cigarettes, chewing tobacco, and vaping devices, such as e-cigarettes. If you need help quitting, ask your health care provider. Try to reduce your stress level by using methods such as yoga or meditation. Talk with your health care provider if you need help to manage your stress. General instructions Watch your dizziness for any changes. Take  over-the-counter and prescription medicines only as told by your health care provider. Talk with your health care provider if you think that your dizziness is caused by a medicine that you are taking. Tell a friend or a family member that you are feeling dizzy. If he or she notices any changes in your behavior, have this person call your health care provider. Keep all follow-up visits. This is important. Contact a health care provider if: Your dizziness does not go away or you have new symptoms. Your dizziness or light-headedness gets worse. You feel nauseous. You have reduced hearing. You have a fever. You have neck pain or a stiff neck. Your dizziness leads to an injury or a fall. Get help right away if: You vomit or have diarrhea and are unable to eat or drink anything. You have problems talking, walking, swallowing, or using your arms, hands, or legs. You feel generally weak. You have any bleeding. You are not thinking clearly or you have trouble forming sentences. It may take a friend or family member to notice this. You have chest pain, abdominal pain, shortness of breath, or sweating. Your vision changes or you develop a severe headache. These symptoms may represent a serious problem that is an emergency. Do not wait to see if the symptoms will go away. Get medical help right away. Call your local emergency services (911 in the [place] Do not drive yourself to the hospital. Summary Dizziness is a feeling of unsteadiness or light-headedness. This condition can be caused by a number of   things, including medicines, dehydration, or illness. Anyone can become dizzy, but dizziness is more common in older adults. Drink enough fluid to keep your urine pale yellow. Do not drink alcohol. Avoid making quick movements if you feel dizzy. Monitor your dizziness for any changes. This information is not intended to replace advice given to you by your health care provider. Make sure you discuss any  questions you have with your health care provider. Document Revised: 01/07/2020 Document Reviewed: 01/07/2020 Elsevier Patient Education  2023 Elsevier Inc.  

## 2022-03-15 NOTE — Progress Notes (Signed)
Subjective:    Patient ID: Charles Pennington, male    DOB: 05/06/1965, 57 y.o.   MRN: [id number]  Chief Complaint  Patient presents with   Loss of Consciousness   PT presents to the office today with chronic follow up. He is noncompliant with his medications. He has not taken his medications in regularly. Has not had any insulin in several weeks.   Reports he has been dizzy and "passing out".   She reports his mother passed away and found her in the bathroom floor.    He is followed by Neurosurgeon for chronic back pain. He is suppose to have back surgery, but has not been able to get his blood glucose stable.    He has COPD and continues to smoke a pack a day. Has SOB with exertion.    He has a hx of substance abuse and admits to taking adderall.  Loss of Consciousness This is a new problem. The current episode started more than 1 month ago. The problem occurs intermittently. There was no loss of consciousness. The symptoms are aggravated by standing. Associated symptoms include back pain, dizziness, malaise/fatigue and weakness. Pertinent negatives include no abdominal pain or vomiting. He has tried bed rest for the symptoms. The treatment provided moderate relief. His past medical history is significant for DM.  Hypertension This is a chronic problem. The current episode started more than 1 year ago. The problem has been resolved since onset. The problem is controlled. Associated symptoms include anxiety, blurred vision, malaise/fatigue, peripheral edema and shortness of breath. Risk factors for coronary artery disease include dyslipidemia, diabetes mellitus, male gender, smoking/tobacco exposure and sedentary lifestyle. The current treatment provides moderate improvement. There is no history of heart failure.  Gastroesophageal Reflux He complains of belching, heartburn and a hoarse voice. He reports no abdominal pain. This is a chronic problem. The current episode started more than 1  year ago. The problem occurs occasionally. Associated symptoms include fatigue. He has tried a PPI for the symptoms. The treatment provided moderate relief.  Diabetes He presents for his follow-up diabetic visit. He has type 2 diabetes mellitus. Hypoglycemia symptoms include dizziness and nervousness/anxiousness. Associated symptoms include blurred vision, fatigue, foot paresthesias and weakness. Symptoms are stable. Diabetic complications include nephropathy and peripheral neuropathy. Risk factors for coronary artery disease include diabetes mellitus, hypertension, male sex, dyslipidemia and sedentary lifestyle. He is following a generally unhealthy diet. (Not checking at home ) An ACE inhibitor/angiotensin II receptor blocker is being taken.  Hyperlipidemia This is a chronic problem. The current episode started more than 1 year ago. The problem is uncontrolled. Exacerbating diseases include obesity. Associated symptoms include shortness of breath. Current antihyperlipidemic treatment includes statins. The current treatment provides moderate improvement of lipids. Risk factors for coronary artery disease include dyslipidemia, diabetes mellitus, hypertension, male sex and a sedentary lifestyle.  Nicotine Dependence Presents for follow-up visit. Symptoms include fatigue and irritability. Symptoms are negative for insomnia. His urge triggers include company of smokers. The symptoms have been stable. He smokes 1 pack of cigarettes per day.  Anxiety Presents for follow-up visit. Symptoms include depressed mood, dizziness, excessive worry, irritability, nervous/anxious behavior, panic, restlessness and shortness of breath. Patient reports no insomnia. Symptoms occur occasionally. The severity of symptoms is moderate.    Depression        This is a chronic problem.  The current episode started more than 1 year ago.   The onset quality is gradual.   The problem occurs intermittently.  Associated symptoms  include fatigue, helplessness, hopelessness, restlessness and sad.  Associated symptoms include does not have insomnia.  Past treatments include SSRIs - Selective serotonin reuptake inhibitors.  Past medical history includes anxiety.   Back Pain This is a chronic problem. The current episode started more than 1 year ago. The problem occurs intermittently. The problem has been waxing and waning since onset. The pain is present in the lumbar spine. The quality of the pain is described as aching. The pain is at a severity of 8/10. The pain is moderate. The symptoms are aggravated by twisting and bending. Associated symptoms include weakness. Pertinent negatives include no abdominal pain. He has tried NSAIDs and muscle relaxant for the symptoms. The treatment provided moderate relief.      Review of Systems  Constitutional:  Positive for fatigue, irritability and malaise/fatigue.  HENT:  Positive for hoarse voice.   Eyes:  Positive for blurred vision.  Respiratory:  Positive for shortness of breath.   Cardiovascular:  Positive for syncope.  Gastrointestinal:  Positive for heartburn. Negative for abdominal pain and vomiting.  Musculoskeletal:  Positive for back pain.  Neurological:  Positive for dizziness and weakness.  Psychiatric/Behavioral:  Positive for depression. The patient is nervous/anxious. The patient does not have insomnia.   All other systems reviewed and are negative.  Family History  Problem Relation Age of Onset   Hypertension Father    Suicidality Father    Diabetes Father    Diabetes Mother    Colon cancer Neg Hx    Inflammatory bowel disease Neg Hx    Social History   Socioeconomic History   Marital status: Single    Spouse name: Not on file   Number of children: 2   Years of education: Not on file   Highest education level: 8th grade  Occupational History   Occupation: disabled Personnel officer  Tobacco Use   Smoking status: Every Day    Packs/day: 1.00    Years:  30.00    Total pack years: 30.00    Types: Cigarettes   Smokeless tobacco: Never  Vaping Use   Vaping Use: Never used  Substance and Sexual Activity   Alcohol use: Never   Drug use: No   Sexual activity: Yes  Other Topics Concern   Not on file  Social History Narrative   ** Merged History Encounter **    His mom lives with him. Children live nearby   Social Determinants of Health   Financial Resource Strain: High Risk (08/13/2020)   Overall Financial Resource Strain (CARDIA)    Difficulty of Paying Living Expenses: Hard  Food Insecurity: Food Insecurity Present (08/13/2020)   Hunger Vital Sign    Worried About Running Out of Food in the Last Year: Often true    Ran Out of Food in the Last Year: Sometimes true  Transportation Needs: No Transportation Needs (08/07/2018)   PRAPARE - Administrator, Civil Service (Medical): No    Lack of Transportation (Non-Medical): No  Physical Activity: Inactive (08/13/2020)   Exercise Vital Sign    Days of Exercise per Week: 0 days    Minutes of Exercise per Session: 0 min  Stress: No Stress Concern Present (08/13/2020)   [employer] of Occupational Health - Occupational Stress Questionnaire    Feeling of Stress : Only a little  Social Connections: Socially Isolated (08/13/2020)   Social Connection and Isolation Panel [NHANES]    Frequency of Communication with Friends and Family: More than three  times a week    Frequency of Social Gatherings with Friends and Family: More than three times a week    Attends Religious Services: Never    Database administrator or Organizations: No    Attends Banker Meetings: Never    Marital Status: Never married       Objective:   Physical Exam Vitals reviewed.  Constitutional:      General: He is not in acute distress.    Appearance: He is well-developed.  HENT:     Head: Normocephalic.     Right Ear: Tympanic membrane normal.     Left Ear: Tympanic membrane normal.   Eyes:     General:        Right eye: No discharge.        Left eye: No discharge.     Pupils: Pupils are equal, round, and reactive to light.  Neck:     Thyroid: No thyromegaly.  Cardiovascular:     Rate and Rhythm: Normal rate and regular rhythm.     Heart sounds: Normal heart sounds. No murmur heard. Pulmonary:     Effort: Pulmonary effort is normal. No respiratory distress.     Breath sounds: Normal breath sounds. No wheezing.     Comments: Decreased sounds of right lung Abdominal:     General: Bowel sounds are normal. There is no distension.     Palpations: Abdomen is soft.     Tenderness: There is no abdominal tenderness.  Musculoskeletal:        General: No tenderness.     Cervical back: Normal range of motion and neck supple.  Skin:    General: Skin is warm and dry.     Findings: No erythema or rash.  Neurological:     Mental Status: He is alert and oriented to person, place, and time.     Cranial Nerves: Cranial nerve deficit present.     Motor: Weakness present.     Coordination: Coordination abnormal.     Deep Tendon Reflexes: Reflexes are normal and symmetric.  Psychiatric:        Behavior: Behavior normal.        Thought Content: Thought content normal.        Judgment: Judgment normal.       BP 102/67   Pulse (!) 117   Temp 98.4 F (36.9 C)   Ht 5\' 8"  (1.727 m)   Wt 202 lb (91.6 kg)   SpO2 (!) 88%   BMI 30.71 kg/m      Assessment & Plan:  Charles Pennington comes in today with chief complaint of Loss of Consciousness   Diagnosis and orders addressed:  1. Syncope, unspecified syncope type - Bayer DCA Hb A1c Waived - CMP14+EGFR - CBC with Differential/Platelet - Microalbumin / creatinine urine ratio - EKG 12-Lead - Ambulatory referral to Cardiology  2. Hypertension associated with diabetes (HCC) - CMP14+EGFR - CBC with Differential/Platelet  3. COPD with acute exacerbation (HCC) - CMP14+EGFR - CBC with Differential/Platelet  4.  Hypothyroidism, unspecified type - CMP14+EGFR - CBC with Differential/Platelet  5. Type 2 diabetes mellitus treated with insulin (HCC) - Bayer DCA Hb A1c Waived - CMP14+EGFR - CBC with Differential/Platelet - Microalbumin / creatinine urine ratio  6. Hyperlipidemia associated with type 2 diabetes mellitus (HCC) - CMP14+EGFR - CBC with Differential/Platelet  7. Diabetic polyneuropathy associated with type 2 diabetes mellitus (HCC) - CMP14+EGFR - CBC with Differential/Platelet - Dulaglutide (TRULICITY) 3 MG/0.5ML SOPN; INJECT  3MG  (0.5ML) ONCE WEEKLY AS DIRECTED  Dispense: 6 mL; Refill: 0 - insulin degludec (TRESIBA FLEXTOUCH) 200 UNIT/ML FlexTouch Pen; Inject 30 Units into the skin at bedtime.  Dispense: 18 mL; Refill: 1  8. Diabetes mellitus with complication (HCC) - Dulaglutide (TRULICITY) 3 PN/3.6RW SOPN; INJECT 3MG  (0.5ML) ONCE WEEKLY AS DIRECTED  Dispense: 6 mL; Refill: 0 - insulin degludec (TRESIBA FLEXTOUCH) 200 UNIT/ML FlexTouch Pen; Inject 30 Units into the skin at bedtime.  Dispense: 18 mL; Refill: 1  9. GAD (generalized anxiety disorder) - PARoxetine (PAXIL) 40 MG tablet; Take 1 tablet (40 mg total) by mouth daily.  Dispense: 90 tablet; Refill: 1  10. Moderate episode of recurrent major depressive disorder (HCC) - PARoxetine (PAXIL) 40 MG tablet; Take 1 tablet (40 mg total) by mouth daily.  Dispense: 90 tablet; Refill: 1  11. Atrial flutter, unspecified type [facility]) - Ambulatory referral to Cardiology  12. Balance problem  13. COPD exacerbation ([place])  14. Hypoxia   Given dizziness, loss of balance, decreased breath sounds of right lung, hypoxia, tachycardia needs to go to ED. Refuses EMS and will have nephew drive him.  Labs pending Health Maintenance reviewed Diet and exercise encouraged  Follow up plan: When discharged from hospital.    Evelina Dun, FNP

## 2022-03-16 DIAGNOSIS — H109 Unspecified conjunctivitis: Secondary | ICD-10-CM | POA: Diagnosis not present

## 2022-03-16 DIAGNOSIS — E785 Hyperlipidemia, unspecified: Secondary | ICD-10-CM | POA: Diagnosis not present

## 2022-03-16 DIAGNOSIS — D72829 Elevated white blood cell count, unspecified: Secondary | ICD-10-CM | POA: Diagnosis not present

## 2022-03-16 DIAGNOSIS — M4802 Spinal stenosis, cervical region: Secondary | ICD-10-CM | POA: Diagnosis not present

## 2022-03-16 DIAGNOSIS — T68XXXA Hypothermia, initial encounter: Secondary | ICD-10-CM | POA: Diagnosis not present

## 2022-03-16 DIAGNOSIS — Y92410 Unspecified street and highway as the place of occurrence of the external cause: Secondary | ICD-10-CM | POA: Diagnosis not present

## 2022-03-16 DIAGNOSIS — G4700 Insomnia, unspecified: Secondary | ICD-10-CM | POA: Diagnosis not present

## 2022-03-16 DIAGNOSIS — I959 Hypotension, unspecified: Secondary | ICD-10-CM | POA: Diagnosis not present

## 2022-03-16 DIAGNOSIS — G629 Polyneuropathy, unspecified: Secondary | ICD-10-CM | POA: Diagnosis not present

## 2022-03-16 DIAGNOSIS — F1910 Other psychoactive substance abuse, uncomplicated: Secondary | ICD-10-CM | POA: Diagnosis not present

## 2022-03-16 DIAGNOSIS — Z720 Tobacco use: Secondary | ICD-10-CM | POA: Diagnosis not present

## 2022-03-16 DIAGNOSIS — E872 Acidosis, unspecified: Secondary | ICD-10-CM | POA: Diagnosis not present

## 2022-03-16 DIAGNOSIS — T50901A Poisoning by unspecified drugs, medicaments and biological substances, accidental (unintentional), initial encounter: Secondary | ICD-10-CM | POA: Diagnosis not present

## 2022-03-16 DIAGNOSIS — S199XXA Unspecified injury of neck, initial encounter: Secondary | ICD-10-CM | POA: Diagnosis not present

## 2022-03-16 DIAGNOSIS — G9389 Other specified disorders of brain: Secondary | ICD-10-CM | POA: Diagnosis not present

## 2022-03-16 DIAGNOSIS — I10 Essential (primary) hypertension: Secondary | ICD-10-CM | POA: Diagnosis not present

## 2022-03-16 DIAGNOSIS — S2242XA Multiple fractures of ribs, left side, initial encounter for closed fracture: Secondary | ICD-10-CM | POA: Diagnosis not present

## 2022-03-16 DIAGNOSIS — R5381 Other malaise: Secondary | ICD-10-CM | POA: Diagnosis not present

## 2022-03-16 DIAGNOSIS — J9601 Acute respiratory failure with hypoxia: Secondary | ICD-10-CM | POA: Diagnosis not present

## 2022-03-16 DIAGNOSIS — R739 Hyperglycemia, unspecified: Secondary | ICD-10-CM | POA: Diagnosis not present

## 2022-03-16 DIAGNOSIS — T40411A Poisoning by fentanyl or fentanyl analogs, accidental (unintentional), initial encounter: Secondary | ICD-10-CM | POA: Diagnosis not present

## 2022-03-16 DIAGNOSIS — E1165 Type 2 diabetes mellitus with hyperglycemia: Secondary | ICD-10-CM | POA: Diagnosis not present

## 2022-03-16 DIAGNOSIS — R4182 Altered mental status, unspecified: Secondary | ICD-10-CM | POA: Diagnosis not present

## 2022-03-16 DIAGNOSIS — G928 Other toxic encephalopathy: Secondary | ICD-10-CM | POA: Diagnosis not present

## 2022-03-16 DIAGNOSIS — I771 Stricture of artery: Secondary | ICD-10-CM | POA: Diagnosis not present

## 2022-03-16 DIAGNOSIS — Y998 Other external cause status: Secondary | ICD-10-CM | POA: Diagnosis not present

## 2022-03-16 DIAGNOSIS — E039 Hypothyroidism, unspecified: Secondary | ICD-10-CM | POA: Diagnosis not present

## 2022-03-16 DIAGNOSIS — R55 Syncope and collapse: Secondary | ICD-10-CM | POA: Diagnosis not present

## 2022-03-16 DIAGNOSIS — H1089 Other conjunctivitis: Secondary | ICD-10-CM | POA: Diagnosis not present

## 2022-03-16 DIAGNOSIS — Z4682 Encounter for fitting and adjustment of non-vascular catheter: Secondary | ICD-10-CM | POA: Diagnosis not present

## 2022-03-16 DIAGNOSIS — J9602 Acute respiratory failure with hypercapnia: Secondary | ICD-10-CM | POA: Diagnosis not present

## 2022-03-16 DIAGNOSIS — J449 Chronic obstructive pulmonary disease, unspecified: Secondary | ICD-10-CM | POA: Diagnosis not present

## 2022-03-16 LAB — CBC WITH DIFFERENTIAL/PLATELET
Basophils Absolute: 0.1 10*3/uL (ref 0.0–0.2)
Basos: 1 %
EOS (ABSOLUTE): 0.5 10*3/uL — ABNORMAL HIGH (ref 0.0–0.4)
Eos: 5 %
Hematocrit: 44.5 % (ref 37.5–51.0)
Hemoglobin: 14.9 g/dL (ref 13.0–17.7)
Immature Grans (Abs): 0 10*3/uL (ref 0.0–0.1)
Immature Granulocytes: 0 %
Lymphocytes Absolute: 2.8 10*3/uL (ref 0.7–3.1)
Lymphs: 30 %
MCH: 31.3 pg (ref 26.6–33.0)
MCHC: 33.5 g/dL (ref 31.5–35.7)
MCV: 94 fL (ref 79–97)
Monocytes Absolute: 0.5 10*3/uL (ref 0.1–0.9)
Monocytes: 5 %
Neutrophils Absolute: 5.5 10*3/uL (ref 1.4–7.0)
Neutrophils: 59 %
Platelets: 227 10*3/uL (ref 150–450)
RBC: 4.76 x10E6/uL (ref 4.14–5.80)
RDW: 12.4 % (ref 11.6–15.4)
WBC: 9.4 10*3/uL (ref 3.4–10.8)

## 2022-03-16 LAB — CMP14+EGFR
ALT: 20 IU/L (ref 0–44)
AST: 21 IU/L (ref 0–40)
Albumin/Globulin Ratio: 1.8 (ref 1.2–2.2)
Albumin: 4.2 g/dL (ref 3.8–4.9)
Alkaline Phosphatase: 95 IU/L (ref 44–121)
BUN/Creatinine Ratio: 15 (ref 9–20)
BUN: 15 mg/dL (ref 6–24)
Bilirubin Total: 0.5 mg/dL (ref 0.0–1.2)
CO2: 23 mmol/L (ref 20–29)
Calcium: 9.5 mg/dL (ref 8.7–10.2)
Chloride: 94 mmol/L — ABNORMAL LOW (ref 96–106)
Creatinine, Ser: 0.97 mg/dL (ref 0.76–1.27)
Globulin, Total: 2.3 g/dL (ref 1.5–4.5)
Glucose: 463 mg/dL (ref 70–99)
Potassium: 3.8 mmol/L (ref 3.5–5.2)
Sodium: 134 mmol/L (ref 134–144)
Total Protein: 6.5 g/dL (ref 6.0–8.5)
eGFR: 92 mL/min/{1.73_m2} (ref >59)

## 2022-03-17 DIAGNOSIS — Z4682 Encounter for fitting and adjustment of non-vascular catheter: Secondary | ICD-10-CM | POA: Diagnosis not present

## 2022-03-17 LAB — MICROALBUMIN / CREATININE URINE RATIO
Creatinine, Urine: 178.7 mg/dL
Microalb/Creat Ratio: 40 mg/g creat — ABNORMAL HIGH (ref 0–29)
Microalbumin, Urine: 70.7 ug/mL

## 2022-03-18 ENCOUNTER — Other Ambulatory Visit: Payer: Self-pay | Admitting: Family

## 2022-03-18 DIAGNOSIS — G4700 Insomnia, unspecified: Secondary | ICD-10-CM

## 2022-03-18 NOTE — Telephone Encounter (Signed)
Last office visit 03/15/22 Last refill 12/17/21, #30, 2 refills

## 2022-03-19 DIAGNOSIS — I771 Stricture of artery: Secondary | ICD-10-CM | POA: Diagnosis not present

## 2022-03-19 LAB — SPECIMEN STATUS REPORT

## 2022-03-19 LAB — HGB A1C W/O EAG: Hgb A1c MFr Bld: 12.7 % — ABNORMAL HIGH (ref 4.8–5.6)

## 2022-03-24 ENCOUNTER — Encounter: Payer: Self-pay | Admitting: Family Medicine

## 2022-03-29 ENCOUNTER — Encounter: Payer: Self-pay | Admitting: Family

## 2022-03-29 ENCOUNTER — Ambulatory Visit: Payer: Medicare HMO | Admitting: Family

## 2022-04-05 ENCOUNTER — Telehealth: Payer: Self-pay | Admitting: Family

## 2022-04-05 NOTE — Telephone Encounter (Signed)
Appt made

## 2022-04-08 ENCOUNTER — Ambulatory Visit (INDEPENDENT_AMBULATORY_CARE_PROVIDER_SITE_OTHER): Payer: Medicare HMO | Admitting: Family

## 2022-04-08 ENCOUNTER — Encounter: Payer: Self-pay | Admitting: Family

## 2022-04-08 ENCOUNTER — Telehealth: Payer: Self-pay | Admitting: Family

## 2022-04-08 VITALS — BP 150/94 | HR 95 | Temp 98.1°F | Ht 68.0 in | Wt 196.4 lb

## 2022-04-08 DIAGNOSIS — F411 Generalized anxiety disorder: Secondary | ICD-10-CM | POA: Diagnosis not present

## 2022-04-08 DIAGNOSIS — F331 Major depressive disorder, recurrent, moderate: Secondary | ICD-10-CM | POA: Diagnosis not present

## 2022-04-08 DIAGNOSIS — J441 Chronic obstructive pulmonary disease with (acute) exacerbation: Secondary | ICD-10-CM

## 2022-04-08 DIAGNOSIS — E1159 Type 2 diabetes mellitus with other circulatory complications: Secondary | ICD-10-CM

## 2022-04-08 DIAGNOSIS — E1142 Type 2 diabetes mellitus with diabetic polyneuropathy: Secondary | ICD-10-CM

## 2022-04-08 DIAGNOSIS — E663 Overweight: Secondary | ICD-10-CM | POA: Diagnosis not present

## 2022-04-08 DIAGNOSIS — E119 Type 2 diabetes mellitus without complications: Secondary | ICD-10-CM

## 2022-04-08 DIAGNOSIS — E039 Hypothyroidism, unspecified: Secondary | ICD-10-CM | POA: Diagnosis not present

## 2022-04-08 DIAGNOSIS — Z794 Long term (current) use of insulin: Secondary | ICD-10-CM

## 2022-04-08 DIAGNOSIS — R2681 Unsteadiness on feet: Secondary | ICD-10-CM | POA: Diagnosis not present

## 2022-04-08 DIAGNOSIS — Z8673 Personal history of transient ischemic attack (TIA), and cerebral infarction without residual deficits: Secondary | ICD-10-CM | POA: Diagnosis not present

## 2022-04-08 DIAGNOSIS — I152 Hypertension secondary to endocrine disorders: Secondary | ICD-10-CM

## 2022-04-08 LAB — BAYER DCA HB A1C WAIVED: HB A1C (BAYER DCA - WAIVED): 14 % — ABNORMAL HIGH (ref 4.8–5.6)

## 2022-04-08 NOTE — Progress Notes (Signed)
Subjective:    Patient ID: Charles Pennington, male    DOB: 1965-07-04, 57 y.o.   MRN: [id number]  Chief Complaint  Patient presents with   Medical Management of Chronic Issues   surgical clearance     PT presents to the office today with chronic follow up. He is noncompliant with his medications. He has not taken his medications in regularly  because he was his mother passed away.   He is currently only taking his Paxil and metformin. Not taking any other medications. Having a hard time.    He is followed by Neurosurgeon for chronic back pain. He is suppose to have back surgery, but has not been able to get his blood glucose stable.    He has COPD and continues to smoke a pack a day. Has SOB with exertion.    He has a hx of substance abuse and admits to taking adderall.  Diabetes He presents for his follow-up diabetic visit. He has type 2 diabetes mellitus. Associated symptoms include blurred vision, fatigue and foot paresthesias. Symptoms are stable. Diabetic complications include a CVA, nephropathy and peripheral neuropathy. Risk factors for coronary artery disease include dyslipidemia, diabetes mellitus, male sex, hypertension and sedentary lifestyle. He is following a generally unhealthy diet. His overall blood glucose range is >200 mg/dl.  Hypertension This is a chronic problem. The current episode started more than 1 year ago. The problem has been waxing and waning since onset. The problem is uncontrolled. Associated symptoms include blurred vision, malaise/fatigue, peripheral edema and shortness of breath. Risk factors for coronary artery disease include male gender, diabetes mellitus, dyslipidemia, sedentary lifestyle and smoking/tobacco exposure. The current treatment provides moderate improvement. Hypertensive end-organ damage includes CVA. Identifiable causes of hypertension include a thyroid problem.  Thyroid Problem Presents for follow-up visit. Symptoms include constipation,  depressed mood, fatigue and hoarse voice. The symptoms have been stable.      Review of Systems  Constitutional:  Positive for fatigue and malaise/fatigue.  HENT:  Positive for hoarse voice.   Eyes:  Positive for blurred vision.  Respiratory:  Positive for shortness of breath.   Gastrointestinal:  Positive for constipation.       Objective:   Physical Exam Vitals reviewed.  Constitutional:      General: He is not in acute distress.    Appearance: He is well-developed.  HENT:     Head: Normocephalic.     Right Ear: Tympanic membrane normal.     Left Ear: Tympanic membrane normal.  Eyes:     General:        Right eye: No discharge.        Left eye: No discharge.     Pupils: Pupils are equal, round, and reactive to light.  Neck:     Thyroid: No thyromegaly.  Cardiovascular:     Rate and Rhythm: Normal rate and regular rhythm.     Heart sounds: Normal heart sounds. No murmur heard. Pulmonary:     Effort: Pulmonary effort is normal. No respiratory distress.     Breath sounds: Normal breath sounds. No wheezing.  Abdominal:     General: Bowel sounds are normal. There is no distension.     Palpations: Abdomen is soft.     Tenderness: There is no abdominal tenderness.  Musculoskeletal:        General: No tenderness. Normal range of motion.     Cervical back: Normal range of motion and neck supple.  Skin:    General:  Skin is warm and dry.     Findings: No erythema or rash.  Neurological:     Mental Status: He is alert and oriented to person, place, and time.     Cranial Nerves: No cranial nerve deficit.     Motor: Weakness present.     Gait: Gait abnormal.     Deep Tendon Reflexes: Reflexes are normal and symmetric.  Psychiatric:        Mood and Affect: Affect is tearful.        Behavior: Behavior normal.        Thought Content: Thought content normal.        Judgment: Judgment normal.      BP (!) 150/94   Pulse 95   Temp 98.1 F (36.7 C) (Temporal)   Ht 5' 8"$   (1.727 m)   Wt 196 lb 6.4 oz (89.1 kg)   SpO2 95%   BMI 29.86 kg/m      Assessment & Plan:   KENDRIC SEWARD comes in today with chief complaint of Medical Management of Chronic Issues and surgical clearance    Diagnosis and orders addressed:  1. Hypertension associated with diabetes ([place])  2. Type 2 diabetes mellitus treated with insulin (HCC) - Bayer DCA Hb A1c Waived - CMP14+EGFR - Lipid panel  3. Hypothyroidism, unspecified type - TSH  4. COPD with acute exacerbation ([place])  5. Diabetic polyneuropathy associated with type 2 diabetes mellitus ([place])  6. History of CVA (cerebrovascular accident)  7. Overweight (BMI 25.0-29.9)  8. Unstable gait  9. GAD (generalized anxiety disorder) - Ambulatory referral to Psychiatry  10. Moderate episode of recurrent major depressive disorder [facility]) Discussing on grief - Ambulatory referral to Psychiatry   Labs pending Encouraged patient to restart medications  Health Maintenance reviewed Diet and exercise encouraged  Follow up plan: 2 weeks   Evelina Dun, FNP

## 2022-04-08 NOTE — Patient Instructions (Signed)
Managing Loss, Adult People experience loss in many different ways throughout their lives. Events such as moving, changing jobs, and losing friends can create a sense of loss. The loss may be as serious as a major health change, divorce, death of a pet, or death of a loved one. All of these types of loss are likely to create a physical and emotional reaction known as grief. Grief is the result of a major change or an absence of something or someone that you count on. Grief is a normal reaction to loss. A variety of factors can affect your grieving experience, including: The nature of your loss. Your relationship to what or whom you lost. Your understanding of grief and how to manage it. Your support system. Be aware that when grief becomes extreme, it can lead to more severe issues like isolation, depression, anxiety, or suicidal thoughts. Talk with your health care provider if you have any of these issues. How to manage lifestyle changes Keep to your normal routine as much as possible. If you have trouble focusing or doing normal activities, it is acceptable to take some time away from your normal routine. Spend time with friends and loved ones. Eat a healthy diet, get plenty of sleep, and rest when you feel tired. How to recognize changes  The way that you deal with your grief will affect your ability to function as you normally do. When grieving, you may experience these changes: Numbness, shock, sadness, anxiety, anger, denial, and guilt. Thoughts about death. Unexpected crying. A physical sensation of emptiness in your stomach. Problems sleeping and eating. Tiredness (fatigue). Loss of interest in normal activities. Dreaming about or imagining seeing the person who died. A need to remember what or whom you lost. Difficulty thinking about anything other than your loss for a period of time. Relief. If you have been expecting the loss for a while, you may feel a sense of relief when it  happens. Follow these instructions at home: Activity Express your feelings in healthy ways, such as: Talking with others about your loss. It may be helpful to find others who have had a similar loss, such as a support group. Writing down your feelings in a journal. Doing physical activities to release stress and emotional energy. Doing creative activities like painting, sculpting, or playing or listening to music. Practicing resilience. This is the ability to recover and adjust after facing challenges. Reading some resources that encourage resilience may help you to learn ways to practice those behaviors.  General instructions Be patient with yourself and others. Allow the grieving process to happen, and remember that grieving takes time. It is likely that you may never feel completely done with some grief. You may find a way to move on while still cherishing memories and feelings about your loss. Accepting your loss is a process. It can take months or longer to adjust. Keep all follow-up visits. This is important. Where to find support To get support for managing loss: Ask your health care provider for help and recommendations, such as grief counseling or therapy. Think about joining a support group for people who are managing a loss. Where to find more information You can find more information about managing loss from: American Society of Clinical Oncology: www.cancer.net American Psychological Association: www.apa.org Contact a health care provider if: Your grief is extreme and keeps getting worse. You have ongoing grief that does not improve. Your body shows symptoms of grief, such as illness. You feel depressed, anxious, or   hopeless. Get help right away if: You have thoughts about hurting yourself or others. Get help right away if you feel like you may hurt yourself or others, or have thoughts about taking your own life. Go to your nearest emergency room or: Call 911. Call the  National Suicide Prevention Lifeline at 1-800-273-8255 or 988. This is open 24 hours a day. Text the Crisis Text Line at 741741. Summary Grief is the result of a major change or an absence of someone or something that you count on. Grief is a normal reaction to loss. The depth of grief and the period of recovery depend on the type of loss and your ability to adjust to the change and process your feelings. Processing grief requires patience and a willingness to accept your feelings and talk about your loss with people who are supportive. It is important to find resources that work for you and to realize that people experience grief differently. There is not one grieving process that works for everyone in the same way. Be aware that when grief becomes extreme, it can lead to more severe issues like isolation, depression, anxiety, or suicidal thoughts. Talk with your health care provider if you have any of these issues. This information is not intended to replace advice given to you by your health care provider. Make sure you discuss any questions you have with your health care provider. Document Revised: 09/22/2020 Document Reviewed: 09/22/2020 Elsevier Patient Education  2023 Elsevier Inc.  

## 2022-04-08 NOTE — Telephone Encounter (Signed)
Called patient to schedule Medicare Annual Wellness Visit (AWV). No voicemail available to leave a message.  Last date of AWV: 08/13/2020   Please schedule an appointment at any time with either Mickel Baas or [place], NHA's. .  If any questions, please contact me at [phone].  Thank you,  Colletta Maryland,  [facility] Direct Dial ??[phone]

## 2022-04-09 LAB — CMP14+EGFR
ALT: 15 IU/L (ref 0–44)
AST: 12 IU/L (ref 0–40)
Albumin/Globulin Ratio: 1.7 (ref 1.2–2.2)
Albumin: 4 g/dL (ref 3.8–4.9)
Alkaline Phosphatase: 96 IU/L (ref 44–121)
BUN/Creatinine Ratio: 11 (ref 9–20)
BUN: 8 mg/dL (ref 6–24)
Bilirubin Total: 0.2 mg/dL (ref 0.0–1.2)
CO2: 24 mmol/L (ref 20–29)
Calcium: 9.5 mg/dL (ref 8.7–10.2)
Chloride: 92 mmol/L — ABNORMAL LOW (ref 96–106)
Creatinine, Ser: 0.75 mg/dL — ABNORMAL LOW (ref 0.76–1.27)
Globulin, Total: 2.3 g/dL (ref 1.5–4.5)
Glucose: 666 mg/dL (ref 70–99)
Potassium: 3.8 mmol/L (ref 3.5–5.2)
Sodium: 133 mmol/L — ABNORMAL LOW (ref 134–144)
Total Protein: 6.3 g/dL (ref 6.0–8.5)
eGFR: 107 mL/min/{1.73_m2} (ref >59)

## 2022-04-09 LAB — LIPID PANEL
Chol/HDL Ratio: 3.2 ratio (ref 0.0–5.0)
Cholesterol, Total: 167 mg/dL (ref 100–199)
HDL: 52 mg/dL (ref >39)
LDL Chol Calc (NIH): 51 mg/dL (ref 0–99)
Triglycerides: 436 mg/dL — ABNORMAL HIGH (ref 0–149)
VLDL Cholesterol Cal: 64 mg/dL — ABNORMAL HIGH (ref 5–40)

## 2022-04-09 LAB — TSH: TSH: 13.9 u[IU]/mL — ABNORMAL HIGH (ref 0.450–4.500)

## 2022-04-14 ENCOUNTER — Other Ambulatory Visit: Payer: Self-pay | Admitting: Family

## 2022-04-14 ENCOUNTER — Other Ambulatory Visit: Payer: Self-pay | Admitting: Family Medicine

## 2022-04-14 DIAGNOSIS — E119 Type 2 diabetes mellitus without complications: Secondary | ICD-10-CM

## 2022-04-14 DIAGNOSIS — E1142 Type 2 diabetes mellitus with diabetic polyneuropathy: Secondary | ICD-10-CM

## 2022-04-14 DIAGNOSIS — E118 Type 2 diabetes mellitus with unspecified complications: Secondary | ICD-10-CM

## 2022-04-14 MED ORDER — TRESIBA FLEXTOUCH 200 UNIT/ML ~~LOC~~ SOPN: 30.0000 [IU] | PEN_INJECTOR | Freq: Every evening | SUBCUTANEOUS | 1 refills | Status: DC

## 2022-04-21 DIAGNOSIS — M25511 Pain in right shoulder: Secondary | ICD-10-CM | POA: Diagnosis not present

## 2022-04-22 ENCOUNTER — Ambulatory Visit (INDEPENDENT_AMBULATORY_CARE_PROVIDER_SITE_OTHER): Payer: Medicare HMO | Admitting: Family

## 2022-04-22 ENCOUNTER — Encounter: Payer: Self-pay | Admitting: Family

## 2022-04-22 VITALS — BP 137/82 | HR 116 | Temp 97.0°F | Ht 68.0 in | Wt 193.0 lb

## 2022-04-22 DIAGNOSIS — Z794 Long term (current) use of insulin: Secondary | ICD-10-CM

## 2022-04-22 DIAGNOSIS — I152 Hypertension secondary to endocrine disorders: Secondary | ICD-10-CM

## 2022-04-22 DIAGNOSIS — F17210 Nicotine dependence, cigarettes, uncomplicated: Secondary | ICD-10-CM | POA: Diagnosis not present

## 2022-04-22 DIAGNOSIS — E1159 Type 2 diabetes mellitus with other circulatory complications: Secondary | ICD-10-CM

## 2022-04-22 DIAGNOSIS — J441 Chronic obstructive pulmonary disease with (acute) exacerbation: Secondary | ICD-10-CM

## 2022-04-22 DIAGNOSIS — F17200 Nicotine dependence, unspecified, uncomplicated: Secondary | ICD-10-CM

## 2022-04-22 DIAGNOSIS — F331 Major depressive disorder, recurrent, moderate: Secondary | ICD-10-CM | POA: Diagnosis not present

## 2022-04-22 DIAGNOSIS — E663 Overweight: Secondary | ICD-10-CM

## 2022-04-22 DIAGNOSIS — E039 Hypothyroidism, unspecified: Secondary | ICD-10-CM | POA: Diagnosis not present

## 2022-04-22 DIAGNOSIS — E119 Type 2 diabetes mellitus without complications: Secondary | ICD-10-CM

## 2022-04-22 LAB — CMP14+EGFR
ALT: 20 IU/L (ref 0–44)
AST: 19 IU/L (ref 0–40)
Albumin/Globulin Ratio: 1.8 (ref 1.2–2.2)
Albumin: 4.1 g/dL (ref 3.8–4.9)
Alkaline Phosphatase: 81 IU/L (ref 44–121)
BUN/Creatinine Ratio: 18 (ref 9–20)
BUN: 13 mg/dL (ref 6–24)
Bilirubin Total: 0.3 mg/dL (ref 0.0–1.2)
CO2: 25 mmol/L (ref 20–29)
Calcium: 9.2 mg/dL (ref 8.7–10.2)
Chloride: 99 mmol/L (ref 96–106)
Creatinine, Ser: 0.74 mg/dL — ABNORMAL LOW (ref 0.76–1.27)
Globulin, Total: 2.3 g/dL (ref 1.5–4.5)
Glucose: 245 mg/dL — ABNORMAL HIGH (ref 70–99)
Potassium: 4 mmol/L (ref 3.5–5.2)
Sodium: 139 mmol/L (ref 134–144)
Total Protein: 6.4 g/dL (ref 6.0–8.5)
eGFR: 107 mL/min/{1.73_m2} (ref >59)

## 2022-04-22 NOTE — Progress Notes (Signed)
Subjective:    Patient ID: Charles Pennington, male    DOB: Nov 19, 1965, 57 y.o.   MRN: [id number]  Chief Complaint  Patient presents with   Diabetes   Pt presents to the office today for diabetes. He is still living in his car. His Mother passed away and has been struggling with this. He reports he is working on getting a home.   Reports he has been taking "some" of his medications.   He has COPD and continues to smoke a pack a day. He has SOB.  Diabetes He presents for his follow-up diabetic visit. He has type 2 diabetes mellitus. Pertinent negatives for hypoglycemia include no nervousness/anxiousness. Associated symptoms include fatigue. Pertinent negatives for diabetes include no blurred vision and no foot paresthesias. Symptoms are stable. Risk factors for coronary artery disease include diabetes mellitus, dyslipidemia, hypertension, male sex and sedentary lifestyle. He is following a generally healthy diet. His overall blood glucose range is >200 mg/dl.  Hypertension This is a chronic problem. The current episode started more than 1 year ago. The problem has been resolved since onset. The problem is controlled. Associated symptoms include malaise/fatigue. Pertinent negatives include no blurred vision, peripheral edema or shortness of breath. Risk factors for coronary artery disease include dyslipidemia, diabetes mellitus, male gender and sedentary lifestyle. The current treatment provides moderate improvement. Hypertensive end-organ damage includes kidney disease and heart failure. Identifiable causes of hypertension include a thyroid problem.  Thyroid Problem Presents for follow-up visit. Symptoms include fatigue and hoarse voice. Patient reports no anxiety or depressed mood. The symptoms have been stable. His past medical history is significant for heart failure.  Depression        This is a chronic problem.  The current episode started more than 1 year ago.   The problem occurs  intermittently.  Associated symptoms include fatigue, helplessness, hopelessness, irritable, decreased interest and sad.  Past treatments include SSRIs - Selective serotonin reuptake inhibitors.  Past medical history includes thyroid problem.   Nicotine Dependence Presents for follow-up visit. Symptoms include fatigue. His urge triggers include company of smokers. The symptoms have been stable. He smokes 1 pack of cigarettes per day.      Review of Systems  Constitutional:  Positive for fatigue and malaise/fatigue.  HENT:  Positive for hoarse voice.   Eyes:  Negative for blurred vision.  Respiratory:  Negative for shortness of breath.   Psychiatric/Behavioral:  Positive for depression. The patient is not nervous/anxious.   All other systems reviewed and are negative.      Objective:   Physical Exam Vitals reviewed.  Constitutional:      General: [name]. He is not in acute distress.    Appearance: He is well-developed.  HENT:     Head: Normocephalic.  Eyes:     General:        Right eye: No discharge.        Left eye: No discharge.     Pupils: Pupils are equal, round, and reactive to light.  Neck:     Thyroid: No thyromegaly.  Cardiovascular:     Rate and Rhythm: Normal rate and regular rhythm.     Heart sounds: Normal heart sounds. No murmur heard. Pulmonary:     Effort: Pulmonary effort is normal. No respiratory distress.     Breath sounds: Normal breath sounds. No wheezing.  Abdominal:     General: Bowel sounds are normal. There is no distension.     Palpations: Abdomen is  soft.     Tenderness: There is no abdominal tenderness.  Musculoskeletal:        General: No tenderness. Normal range of motion.     Cervical back: Normal range of motion and neck supple.  Skin:    General: Skin is warm and dry.     Findings: No erythema or rash.  Neurological:     Mental Status: He is alert and oriented to person, place, and time.     Cranial Nerves: No cranial nerve  deficit.     Deep Tendon Reflexes: Reflexes are normal and symmetric.  Psychiatric:        Mood and Affect: Mood is anxious.        Behavior: Behavior is hyperactive.        Thought Content: Thought content normal.        Judgment: Judgment normal.       BP 137/82   Pulse (!) 116   Temp (!) 97 F (36.1 C) (Temporal)   Ht '5\' 8"'$  (1.727 m)   Wt 193 lb (87.5 kg)   SpO2 94%   BMI 29.35 kg/m      Assessment & Plan:  Charles Pennington comes in today with chief complaint of Diabetes   Diagnosis and orders addressed:  1. Hypothyroidism, unspecified type - CMP14+EGFR  2. Type 2 diabetes mellitus treated with insulin (HCC) - CMP14+EGFR  3. SMOKER - CMP14+EGFR  4. Hypertension associated with diabetes ([place]) - CMP14+EGFR  5. Moderate episode of recurrent major depressive disorder (HCC) - CMP14+EGFR  6. Overweight (BMI 25.0-29.9) - CMP14+EGFR  7. COPD with acute exacerbation ([place]) - CMP14+EGFR   Labs pending Health Maintenance reviewed Diet and exercise encouraged  Follow up plan: 1 months   Evelina Dun, FNP

## 2022-04-22 NOTE — Patient Instructions (Signed)

## 2022-04-26 ENCOUNTER — Other Ambulatory Visit: Payer: Self-pay | Admitting: Family

## 2022-04-27 ENCOUNTER — Telehealth: Payer: Self-pay | Admitting: Family

## 2022-04-27 DIAGNOSIS — F411 Generalized anxiety disorder: Secondary | ICD-10-CM

## 2022-04-27 DIAGNOSIS — E039 Hypothyroidism, unspecified: Secondary | ICD-10-CM

## 2022-04-27 DIAGNOSIS — J209 Acute bronchitis, unspecified: Secondary | ICD-10-CM

## 2022-04-27 DIAGNOSIS — R062 Wheezing: Secondary | ICD-10-CM

## 2022-04-27 DIAGNOSIS — J441 Chronic obstructive pulmonary disease with (acute) exacerbation: Secondary | ICD-10-CM

## 2022-04-27 DIAGNOSIS — E118 Type 2 diabetes mellitus with unspecified complications: Secondary | ICD-10-CM

## 2022-04-27 DIAGNOSIS — L237 Allergic contact dermatitis due to plants, except food: Secondary | ICD-10-CM

## 2022-04-27 DIAGNOSIS — E1169 Type 2 diabetes mellitus with other specified complication: Secondary | ICD-10-CM

## 2022-04-27 DIAGNOSIS — E1142 Type 2 diabetes mellitus with diabetic polyneuropathy: Secondary | ICD-10-CM

## 2022-04-27 DIAGNOSIS — F331 Major depressive disorder, recurrent, moderate: Secondary | ICD-10-CM

## 2022-04-27 MED ORDER — ATORVASTATIN CALCIUM 80 MG PO TABS: 80.0000 mg | ORAL_TABLET | Freq: Every day | ORAL | 1 refills | Status: AC

## 2022-04-27 MED ORDER — METFORMIN HCL ER 750 MG PO TB24: 1500.0000 mg | ORAL_TABLET | Freq: Every day | ORAL | 1 refills | Status: AC

## 2022-04-27 MED ORDER — DAPAGLIFLOZIN PROPANEDIOL 10 MG PO TABS: 10.0000 mg | ORAL_TABLET | Freq: Every day | ORAL | 3 refills | Status: AC

## 2022-04-27 MED ORDER — PAROXETINE HCL 40 MG PO TABS: 40.0000 mg | ORAL_TABLET | Freq: Every day | ORAL | 1 refills | Status: AC

## 2022-04-27 MED ORDER — LEVOTHYROXINE SODIUM 200 MCG PO TABS: 200.0000 ug | ORAL_TABLET | Freq: Every day | ORAL | 2 refills | Status: DC

## 2022-04-27 MED ORDER — TRESIBA FLEXTOUCH 200 UNIT/ML ~~LOC~~ SOPN: 30.0000 [IU] | PEN_INJECTOR | Freq: Every evening | SUBCUTANEOUS | 1 refills | Status: DC

## 2022-04-27 MED ORDER — TRULICITY 3 MG/0.5ML ~~LOC~~ SOAJ: SUBCUTANEOUS | 0 refills | Status: AC

## 2022-04-27 MED ORDER — HYDROXYZINE HCL 10 MG PO TABS: 10.0000 mg | ORAL_TABLET | Freq: Three times a day (TID) | ORAL | 0 refills | Status: AC | PRN

## 2022-04-27 MED ORDER — TRELEGY ELLIPTA 100-62.5-25 MCG/ACT IN AEPB: INHALATION_SPRAY | RESPIRATORY_TRACT | 2 refills | Status: AC

## 2022-04-27 NOTE — Telephone Encounter (Signed)
Called and spoke with patient he is aware rx has been sent to pharmacy

## 2022-06-07 ENCOUNTER — Telehealth: Payer: Self-pay | Admitting: Family

## 2022-06-07 NOTE — Telephone Encounter (Signed)
Called patient to schedule Medicare Annual Wellness Visit (AWV). Unable to reach patient.  Last date of AWV: 08/13/2020     Please schedule an appointment at any time with either Vernona Rieger or [place], NHA's..  If any questions, please contact me at [phone].  Thank you ,  Judeth Cornfield,  [facility] Clinical Support [facility] AWV Program Direct Dial ??[phone]

## 2022-07-15 ENCOUNTER — Encounter: Payer: Self-pay | Admitting: Family

## 2022-07-15 ENCOUNTER — Ambulatory Visit: Payer: Medicare HMO

## 2022-07-15 ENCOUNTER — Ambulatory Visit (INDEPENDENT_AMBULATORY_CARE_PROVIDER_SITE_OTHER): Payer: Medicare HMO | Admitting: Family

## 2022-07-15 VITALS — BP 119/78 | HR 148 | Temp 97.5°F | Ht 68.0 in | Wt 178.0 lb

## 2022-07-15 DIAGNOSIS — F1410 Cocaine abuse, uncomplicated: Secondary | ICD-10-CM | POA: Diagnosis not present

## 2022-07-15 DIAGNOSIS — R000 Tachycardia, unspecified: Secondary | ICD-10-CM

## 2022-07-15 DIAGNOSIS — E1169 Type 2 diabetes mellitus with other specified complication: Secondary | ICD-10-CM | POA: Diagnosis not present

## 2022-07-15 DIAGNOSIS — Z91148 Patient's other noncompliance with medication regimen for other reason: Secondary | ICD-10-CM

## 2022-07-15 DIAGNOSIS — E039 Hypothyroidism, unspecified: Secondary | ICD-10-CM

## 2022-07-15 DIAGNOSIS — F331 Major depressive disorder, recurrent, moderate: Secondary | ICD-10-CM

## 2022-07-15 DIAGNOSIS — E785 Hyperlipidemia, unspecified: Secondary | ICD-10-CM

## 2022-07-15 DIAGNOSIS — E1142 Type 2 diabetes mellitus with diabetic polyneuropathy: Secondary | ICD-10-CM | POA: Diagnosis not present

## 2022-07-15 DIAGNOSIS — E118 Type 2 diabetes mellitus with unspecified complications: Secondary | ICD-10-CM

## 2022-07-15 DIAGNOSIS — I152 Hypertension secondary to endocrine disorders: Secondary | ICD-10-CM

## 2022-07-15 DIAGNOSIS — E1159 Type 2 diabetes mellitus with other circulatory complications: Secondary | ICD-10-CM

## 2022-07-15 DIAGNOSIS — F411 Generalized anxiety disorder: Secondary | ICD-10-CM | POA: Diagnosis not present

## 2022-07-15 LAB — CMP14+EGFR

## 2022-07-15 LAB — CBC WITH DIFFERENTIAL/PLATELET
Basophils Absolute: 0.1 10*3/uL (ref 0.0–0.2)
Hematocrit: 38.4 % (ref 37.5–51.0)
MCV: 89 fL (ref 79–97)
Monocytes Absolute: 1 10*3/uL — ABNORMAL HIGH (ref 0.1–0.9)
Neutrophils: 68 %
RDW: 11.7 % (ref 11.6–15.4)

## 2022-07-15 LAB — LIPID PANEL

## 2022-07-15 NOTE — Patient Instructions (Signed)
Sinus Tachycardia  Sinus tachycardia is a fast heartbeat. In sinus tachycardia, the heart beats more than 100 times a minute. Sinus tachycardia starts in the part of the heart called the sinoatrial (SA) node. Sinus tachycardia may be harmless, or it may be a sign of a serious condition. What are the causes? This condition may be caused by: Exercise or exertion. A fever. Pain. Loss of body fluids (dehydration). Severe bleeding (hemorrhage). Anxiety and stress. Certain substances, including: Alcohol. Caffeine. Tobacco and nicotine products. Cold medicines. Illegal drugs. Medical conditions including: Heart disease. An infection. An overactive thyroid (hyperthyroidism). A lack of red blood cells (anemia). What are the signs or symptoms? Symptoms of this condition include: A feeling that the heart is beating fast or unevenly (palpitations). Suddenly noticing your heartbeat (cardiac awareness). Lightheadedness. Tiredness (fatigue). Shortness of breath. Chest pain. Nausea. Fainting. How is this diagnosed? This condition is diagnosed with: A physical exam. Tests or monitoring, such as: Blood tests. An electrocardiogram (ECG). This test measures the electrical activity of the heart. Ambulatory cardiac monitor. This records your heartbeats for 24 hours or more. You may be referred to a heart specialist (cardiologist). How is this treated? Treatment for this condition depends on the cause. Treatment may involve: Treating the underlying condition. Taking new medicines or changing your current medicines as told by your health care provider. Making changes to your diet or lifestyle. Follow these instructions at home: Lifestyle  Do not use any products that contain nicotine or tobacco. These products include cigarettes, chewing tobacco, and vaping devices, such as e-cigarettes. If you need help quitting, ask your health care provider. Do not use illegal drugs, such as  cocaine. Learn relaxation methods to help you when you get stressed or anxious. These include deep breathing. Avoid caffeine or other stimulants, including herbal stimulants that are found in energy drinks. Alcohol use  Do not drink alcohol if: Your health care provider tells you not to drink. You are pregnant, may be pregnant, or are planning to become pregnant. If you drink alcohol: Limit how much you have to: 0-1 drink a day for women. 0-2 drinks a day for men. Know how much alcohol is in your drink. In the [place] one drink equals one 12 oz bottle of beer (355 mL), one 5 oz glass of wine (148 mL), or one 1 oz glass of hard liquor (44 mL). General instructions Drink enough fluids to keep your urine pale yellow. Take over-the-counter and prescription medicines only as told by your health care provider. Ask your health care provider about taking vitamins, herbs, and supplements. Contact a health care provider if: You have vomiting or diarrhea that does not go away. You have a fever. You have weakness or dizziness. You feel faint. Get help right away if: You have pain in your chest, upper arms, jaw, or neck. You have palpitations that do not go away. Summary In sinus tachycardia, the heart beats more than 100 times a minute. Sinus tachycardia may be harmless, or it may be a sign of a serious condition. Treatment for this condition depends on the cause or the underlying condition. Get help right away if you have pain in your chest, upper arms, jaw, or neck. This information is not intended to replace advice given to you by your health care provider. Make sure you discuss any questions you have with your health care provider. Document Revised: 06/02/2021 Document Reviewed: 06/02/2021 Elsevier Patient Education  2024 [employer].

## 2022-07-15 NOTE — Progress Notes (Signed)
Subjective:    Patient ID: Charles Pennington, male    DOB: 1965-12-30, 57 y.o.   MRN: [id number]  Chief Complaint  Patient presents with   Diabetes    Just got out of jail yesterday they did not give him all meds   Pt presents to the office today with hypoglycemia. He has been in jail and has not been taking all medications his medications.   He is having chest pain and having tremors. Admits to taking cocaine yesterday and an xanax.  Diabetes He presents for his follow-up diabetic visit. He has type 2 diabetes mellitus. Hypoglycemia symptoms include nervousness/anxiousness. Associated symptoms include blurred vision, chest pain, fatigue and foot paresthesias. Symptoms are worsening. Diabetic complications include peripheral neuropathy. Risk factors for coronary artery disease include dyslipidemia, diabetes mellitus, hypertension, male sex and sedentary lifestyle. He is following a generally unhealthy diet. His overall blood glucose range is 140-180 mg/dl.  Hypertension This is a chronic problem. The current episode started more than 1 year ago. The problem is unchanged. The problem is uncontrolled. Associated symptoms include anxiety, blurred vision, chest pain and malaise/fatigue. Pertinent negatives include no peripheral edema or shortness of breath. Risk factors for coronary artery disease include dyslipidemia, diabetes mellitus, male gender and smoking/tobacco exposure. The current treatment provides moderate improvement. Identifiable causes of hypertension include a thyroid problem.  Thyroid Problem Presents for follow-up visit. Symptoms include anxiety, dry skin and fatigue. Patient reports no hoarse voice. The symptoms have been stable. His past medical history is significant for hyperlipidemia.  Hyperlipidemia This is a chronic problem. The current episode started more than 1 year ago. The problem is controlled. Associated symptoms include chest pain. Pertinent negatives include no  shortness of breath. Current antihyperlipidemic treatment includes statins. The current treatment provides moderate improvement of lipids. Risk factors for coronary artery disease include dyslipidemia, diabetes mellitus, hypertension and a sedentary lifestyle.  Depression        This is a chronic problem.  The current episode started more than 1 year ago.   The problem occurs intermittently.  Associated symptoms include fatigue, helplessness, hopelessness and sad.  Past treatments include SSRIs - Selective serotonin reuptake inhibitors.  Past medical history includes thyroid problem and anxiety.   Anxiety Presents for follow-up visit. Symptoms include chest pain, excessive worry, irritability and nervous/anxious behavior. Patient reports no shortness of breath. Symptoms occur most days. The severity of symptoms is moderate.    Chest Pain  This is a new problem. The current episode started 1 to 4 weeks ago. The onset quality is gradual. The pain is mild. The quality of the pain is described as dull. Associated symptoms include malaise/fatigue. Pertinent negatives include no shortness of breath.  His past medical history is significant for hyperlipidemia, hypertension and thyroid problem.      Review of Systems  Constitutional:  Positive for fatigue, irritability and malaise/fatigue.  HENT:  Negative for hoarse voice.   Eyes:  Positive for blurred vision.  Respiratory:  Negative for shortness of breath.   Cardiovascular:  Positive for chest pain.  Psychiatric/Behavioral:  Positive for depression. The patient is nervous/anxious.   All other systems reviewed and are negative.  Family History  Problem Relation Age of Onset   Hypertension Father    Suicidality Father    Diabetes Father    Diabetes Mother    Colon cancer Neg Hx    Inflammatory bowel disease Neg Hx    Social History   Socioeconomic History   Marital  status: Single    Spouse name: Not on file   Number of children: 2    Years of education: Not on file   Highest education level: 8th grade  Occupational History   Occupation: disabled electrician  Tobacco Use   Smoking status: Every Day    Packs/day: 1.00    Years: 30.00    Additional pack years: 0.00    Total pack years: 30.00    Types: Cigarettes   Smokeless tobacco: Never  Vaping Use   Vaping Use: Never used  Substance and Sexual Activity   Alcohol use: Never   Drug use: No   Sexual activity: Yes  Other Topics Concern   Not on file  Social History Narrative   ** Merged History Encounter **    His mom lives with him. Children live nearby   Social Determinants of Health   Financial Resource Strain: High Risk (08/13/2020)   Overall Financial Resource Strain (CARDIA)    Difficulty of Paying Living Expenses: Hard  Food Insecurity: Food Insecurity Present (08/13/2020)   Hunger Vital Sign    Worried About Running Out of Food in the Last Year: Often true    Ran Out of Food in the Last Year: Sometimes true  Transportation Needs: No Transportation Needs (08/07/2018)   PRAPARE - Administrator, Civil Service (Medical): No    Lack of Transportation (Non-Medical): No  Physical Activity: Inactive (08/13/2020)   Exercise Vital Sign    Days of Exercise per Week: 0 days    Minutes of Exercise per Session: 0 min  Stress: No Stress Concern Present (08/13/2020)   [employer] of Occupational Health - Occupational Stress Questionnaire    Feeling of Stress : Only a little  Social Connections: Socially Isolated (08/13/2020)   Social Connection and Isolation Panel [NHANES]    Frequency of Communication with Friends and Family: More than three times a week    Frequency of Social Gatherings with Friends and Family: More than three times a week    Attends Religious Services: Never    Database administrator or Organizations: No    Attends Banker Meetings: Never    Marital Status: Never married        Objective:   Physical  Exam Vitals reviewed.  Constitutional:      General: He is not in acute distress.    Appearance: He is well-developed.  HENT:     Head: Normocephalic.  Eyes:     General:        Right eye: No discharge.        Left eye: No discharge.     Pupils: Pupils are equal, round, and reactive to light.  Neck:     Thyroid: No thyromegaly.  Cardiovascular:     Rate and Rhythm: Tachycardia present. Rhythm irregular.     Heart sounds: Normal heart sounds. No murmur heard. Pulmonary:     Effort: Pulmonary effort is normal. No respiratory distress.     Breath sounds: Normal breath sounds. No wheezing.  Abdominal:     General: Bowel sounds are normal. There is no distension.     Palpations: Abdomen is soft.     Tenderness: There is no abdominal tenderness.  Musculoskeletal:        General: No tenderness. Normal range of motion.     Cervical back: Normal range of motion and neck supple.  Skin:    General: Skin is warm and dry.  Coloration: Skin is pale.     Findings: No erythema or rash.  Neurological:     Mental Status: He is alert and oriented to person, place, and time.     Cranial Nerves: No cranial nerve deficit.     Deep Tendon Reflexes: Reflexes are normal and symmetric.  Psychiatric:        Behavior: Behavior normal.        Thought Content: Thought content normal.        Judgment: Judgment normal.       BP 119/78   Pulse (!) 148   Temp (!) 97.5 F (36.4 C) (Temporal)   Ht 5\' 8"  (1.727 m)   Wt 178 lb (80.7 kg)   BMI 27.06 kg/m      Assessment & Plan:  DEVERICK WARCHOL comes in today with chief complaint of Diabetes (Just got out of jail yesterday they did not give him all meds)   Diagnosis and orders addressed:  1. Diabetes mellitus with complication (HCC) - Hemoglobin A1c - Glucose Hemocue Waived - CBC with Differential/Platelet - CMP14+EGFR - Lipid panel - Bayer DCA Hb A1c Waived  2. Moderate episode of recurrent major depressive disorder (HCC)  3.  Diabetic polyneuropathy associated with type 2 diabetes mellitus (HCC)  4. GAD (generalized anxiety disorder)  5. Hyperlipidemia associated with type 2 diabetes mellitus (HCC)  6. Hypertension associated with diabetes (HCC)  7. Hypothyroidism, unspecified type - TSH  8. Noncompliance with medications   9. Tachycardia - EKG 12-Lead  10. Cocaine abuse (HCC)   Given chest pain, EKG, and recent cocaine pt sent to ED. He refuses EMS and states he will get a friend to drive him. I attempted multiple times for EMS, but refused.  Discussed importance of not taking cocaine and xanax Labs pending Glucose is elevated not low Health Maintenance reviewed Diet and exercise encouraged Approx 40 mins spent with patient, charting, and educations.   Follow up plan: 1 week   [name], FNP

## 2022-07-16 ENCOUNTER — Emergency Department (HOSPITAL_COMMUNITY)

## 2022-07-16 ENCOUNTER — Other Ambulatory Visit: Payer: Self-pay | Admitting: Family

## 2022-07-16 ENCOUNTER — Encounter (HOSPITAL_COMMUNITY): Payer: Self-pay | Admitting: Emergency Medicine

## 2022-07-16 ENCOUNTER — Emergency Department (HOSPITAL_COMMUNITY)
Admission: EM | Admit: 2022-07-16 | Discharge: 2022-07-16 | Disposition: A | Discharge status: Home / Self Care | Attending: Emergency Medicine | Admitting: Emergency Medicine

## 2022-07-16 ENCOUNTER — Other Ambulatory Visit: Payer: Self-pay

## 2022-07-16 DIAGNOSIS — E039 Hypothyroidism, unspecified: Secondary | ICD-10-CM | POA: Diagnosis not present

## 2022-07-16 DIAGNOSIS — R0789 Other chest pain: Secondary | ICD-10-CM | POA: Diagnosis not present

## 2022-07-16 DIAGNOSIS — Z79899 Other long term (current) drug therapy: Secondary | ICD-10-CM | POA: Insufficient documentation

## 2022-07-16 DIAGNOSIS — E119 Type 2 diabetes mellitus without complications: Secondary | ICD-10-CM | POA: Diagnosis not present

## 2022-07-16 DIAGNOSIS — Z7984 Long term (current) use of oral hypoglycemic drugs: Secondary | ICD-10-CM | POA: Insufficient documentation

## 2022-07-16 DIAGNOSIS — Z7951 Long term (current) use of inhaled steroids: Secondary | ICD-10-CM | POA: Diagnosis not present

## 2022-07-16 DIAGNOSIS — E118 Type 2 diabetes mellitus with unspecified complications: Secondary | ICD-10-CM

## 2022-07-16 DIAGNOSIS — J45909 Unspecified asthma, uncomplicated: Secondary | ICD-10-CM | POA: Diagnosis not present

## 2022-07-16 DIAGNOSIS — R079 Chest pain, unspecified: Secondary | ICD-10-CM | POA: Diagnosis present

## 2022-07-16 DIAGNOSIS — E1142 Type 2 diabetes mellitus with diabetic polyneuropathy: Secondary | ICD-10-CM

## 2022-07-16 DIAGNOSIS — R0602 Shortness of breath: Secondary | ICD-10-CM | POA: Diagnosis not present

## 2022-07-16 DIAGNOSIS — I10 Essential (primary) hypertension: Secondary | ICD-10-CM | POA: Diagnosis not present

## 2022-07-16 DIAGNOSIS — Z794 Long term (current) use of insulin: Secondary | ICD-10-CM | POA: Insufficient documentation

## 2022-07-16 LAB — BASIC METABOLIC PANEL
Anion gap: 9 (ref 5–15)
BUN: 36 mg/dL — ABNORMAL HIGH (ref 6–20)
CO2: 27 mmol/L (ref 22–32)
Calcium: 8.6 mg/dL — ABNORMAL LOW (ref 8.9–10.3)
Chloride: 97 mmol/L — ABNORMAL LOW (ref 98–111)
Creatinine, Ser: 0.81 mg/dL (ref 0.61–1.24)
GFR, Estimated: >60 mL/min (ref >60)
Glucose, Bld: 305 mg/dL — ABNORMAL HIGH (ref 70–99)
Potassium: 3.6 mmol/L (ref 3.5–5.1)
Sodium: 133 mmol/L — ABNORMAL LOW (ref 135–145)

## 2022-07-16 LAB — CBC WITH DIFFERENTIAL/PLATELET
Basos: 1 %
EOS (ABSOLUTE): 0 10*3/uL (ref 0.0–0.4)
Eos: 0 %
Hemoglobin: 13.4 g/dL (ref 13.0–17.7)
Immature Grans (Abs): 0 10*3/uL (ref 0.0–0.1)
Immature Granulocytes: 0 %
Lymphocytes Absolute: 2.9 10*3/uL (ref 0.7–3.1)
Lymphs: 23 %
MCH: 31.2 pg (ref 26.6–33.0)
MCHC: 34.9 g/dL (ref 31.5–35.7)
Monocytes: 8 %
Neutrophils Absolute: 8.4 10*3/uL — ABNORMAL HIGH (ref 1.4–7.0)
Platelets: 206 10*3/uL (ref 150–450)
RBC: 4.3 x10E6/uL (ref 4.14–5.80)
WBC: 12.5 10*3/uL — ABNORMAL HIGH (ref 3.4–10.8)

## 2022-07-16 LAB — LIPID PANEL
Chol/HDL Ratio: 2.2 ratio (ref 0.0–5.0)
HDL: 49 mg/dL (ref >39)
LDL Chol Calc (NIH): 39 mg/dL (ref 0–99)
Triglycerides: 102 mg/dL (ref 0–149)

## 2022-07-16 LAB — CMP14+EGFR
ALT: 82 IU/L — ABNORMAL HIGH (ref 0–44)
AST: 161 IU/L — ABNORMAL HIGH (ref 0–40)
Albumin/Globulin Ratio: 2.1 (ref 1.2–2.2)
BUN/Creatinine Ratio: 42 — ABNORMAL HIGH (ref 9–20)
BUN: 56 mg/dL — ABNORMAL HIGH (ref 6–24)
Bilirubin Total: 0.6 mg/dL (ref 0.0–1.2)
CO2: 24 mmol/L (ref 20–29)
Calcium: 10.1 mg/dL (ref 8.7–10.2)
Chloride: 101 mmol/L (ref 96–106)
Creatinine, Ser: 1.33 mg/dL — ABNORMAL HIGH (ref 0.76–1.27)
Globulin, Total: 2.2 g/dL (ref 1.5–4.5)
Glucose: 226 mg/dL — ABNORMAL HIGH (ref 70–99)
Sodium: 145 mmol/L — ABNORMAL HIGH (ref 134–144)
Total Protein: 6.9 g/dL (ref 6.0–8.5)
eGFR: 63 mL/min/{1.73_m2} (ref >59)

## 2022-07-16 LAB — GLUCOSE HEMOCUE WAIVED: Glu Hemocue Waived: 226 mg/dL — ABNORMAL HIGH (ref 70–99)

## 2022-07-16 LAB — CBC
HCT: 32.7 % — ABNORMAL LOW (ref 39.0–52.0)
Hemoglobin: 11.6 g/dL — ABNORMAL LOW (ref 13.0–17.0)
MCH: 31.4 pg (ref 26.0–34.0)
MCHC: 35.5 g/dL (ref 30.0–36.0)
MCV: 88.6 fL (ref 80.0–100.0)
Platelets: 142 10*3/uL — ABNORMAL LOW (ref 150–400)
RBC: 3.69 MIL/uL — ABNORMAL LOW (ref 4.22–5.81)
RDW: 12.2 % (ref 11.5–15.5)
WBC: 7.9 10*3/uL (ref 4.0–10.5)
nRBC: 0 % (ref 0.0–0.2)

## 2022-07-16 LAB — TROPONIN I (HIGH SENSITIVITY)
Troponin I (High Sensitivity): 8 ng/L (ref <18)
Troponin I (High Sensitivity): 9 ng/L (ref <18)

## 2022-07-16 LAB — BAYER DCA HB A1C WAIVED: HB A1C (BAYER DCA - WAIVED): 10.4 % — ABNORMAL HIGH (ref 4.8–5.6)

## 2022-07-16 MED ORDER — ASPIRIN 81 MG PO CHEW
324.0000 mg | CHEWABLE_TABLET | Freq: Once | ORAL | Status: AC
  Administered 2022-07-16: 324 mg via ORAL
  Filled 2022-07-16: qty 4

## 2022-07-16 MED ORDER — TRESIBA FLEXTOUCH 200 UNIT/ML ~~LOC~~ SOPN
40.0000 [IU] | PEN_INJECTOR | Freq: Every evening | SUBCUTANEOUS | 1 refills | Status: AC
Start: 2022-07-16 — End: ?

## 2022-07-16 NOTE — ED Provider Notes (Signed)
[facility] EMERGENCY DEPARTMENT AT [facility] Provider Note   CSN: [id number] Arrival date & time: 07/16/22  1149     History  Chief Complaint  Patient presents with   Chest Pain    Charles Pennington is a 57 y.o. male.  With a history of previous stroke, diabetes, hypertension, hypothyroidism, asthma, anxiety who presents to the ED for evaluation of chest pain.  This pain began approximately 3 days ago.  It waxes and wanes in intensity but remains constant.  Described as a sharp and stabbing pain.  Localized to the left side of the chest.  Does not radiate.  It is not exertional.  He states emotional stress makes the pain worse.  Resting makes the pain better.  Also reports mild exertional shortness of breath.  Patient has recently used cocaine.  States he saw his PCP yesterday and informed them about the shortness of breath and was encouraged to come straight to the emergency department.  While driving home he was pulled over and brought to jail due to expired tabs on his vehicle.  He denies unilateral leg swelling, history of DVT or PE, hemoptysis, estrogen use, recent cancer treatments, surgeries, long distance travel.  He states his mother recently passed away which has been causing him some increased stress.   Chest Pain Associated symptoms: shortness of breath        Home Medications Prior to Admission medications   Medication Sig Start Date End Date Taking? Authorizing Provider  Accu-Chek Softclix Lancets lancets Check BS BID Dx E11.65 12/24/21   [name] A, FNP  albuterol (PROVENTIL) (2.5 MG/3ML) 0.083% nebulizer solution NEBULIZE 1 VIAL EVERY 6 HOURS AS NEEDED FOR WHEEZING OR SHORTNESS OF BREATH 09/07/19   [name] A, FNP  albuterol (VENTOLIN HFA) 108 (90 Base) MCG/ACT inhaler INHALE 2 PUFFS INTO THE LUNGS EVERY 6 (SIX) HOURS AS NEEDED FOR WHEEZING. 01/29/21   [name] A, FNP  Alcohol Swabs (B-D SINGLE USE SWABS REGULAR) PADS Check BS BID Dx E11.65  12/24/21   [name] A, FNP  atorvastatin (LIPITOR) 80 MG tablet Take 1 tablet (80 mg total) by mouth daily. 04/27/22   Junie Spencer, FNP  Blood Glucose Calibration (ACCU-CHEK GUIDE CONTROL) LIQD Use with Accu-chek meter Dx E11.65 12/24/21   [name] A, FNP  Blood Glucose Monitoring Suppl (ACCU-CHEK GUIDE ME) w/Device KIT Check BS BID Dx E11.65 12/24/21   Junie Spencer, FNP  Continuous Blood Gluc Receiver (FREESTYLE LIBRE 2 READER) DEVI Use to test blood sugar continuously. DX: E11.65 01/12/22   Junie Spencer, FNP  Continuous Blood Gluc Sensor (FREESTYLE LIBRE 2 SENSOR) MISC Use to test blood sugar continuously. DX: E11.65 01/12/22   [name] A, FNP  dapagliflozin propanediol (FARXIGA) 10 MG TABS tablet Take 1 tablet (10 mg total) by mouth daily. 04/27/22   Junie Spencer, FNP  Dulaglutide (TRULICITY) 3 MG/0.5ML SOPN INJECT 3MG  (0.5ML) ONCE WEEKLY AS DIRECTED 04/27/22   [name] A, FNP  Fluticasone-Umeclidin-Vilant (TRELEGY ELLIPTA) 100-62.5-25 MCG/ACT AEPB INHALE 1 ONCE A DAY AS DIRECTED Patient not taking: Reported on 07/15/2022 04/27/22   [name] A, FNP  glucose blood (ACCU-CHEK GUIDE) test strip Check BS BID Dx E11.65 Patient not taking: Reported on 04/08/2022 12/24/21   Junie Spencer, FNP  hydrOXYzine (ATARAX) 10 MG tablet Take 1 tablet (10 mg total) by mouth 3 (three) times daily as needed for itching. 04/27/22   Junie Spencer, FNP  insulin degludec (TRESIBA FLEXTOUCH)  200 UNIT/ML FlexTouch Pen Inject 40 Units into the skin at bedtime. 07/16/22   [name] A, FNP  Insulin Pen Needle (ULTICARE MICRO PEN NEEDLES) 32G X 4 MM MISC USE AS DIRECTED TWICE A DAY Patient not taking: Reported on 04/08/2022 07/23/21   Junie Spencer, FNP  levothyroxine (SYNTHROID) 200 MCG tablet Take 1 tablet (200 mcg total) by mouth daily. Patient not taking: Reported on 07/15/2022 04/27/22 04/27/23  Junie Spencer, FNP  metFORMIN (GLUCOPHAGE-XR) 750 MG 24 hr tablet Take 2 tablets  (1,500 mg total) by mouth daily with breakfast. 04/27/22   [name] A, FNP  PARoxetine (PAXIL) 40 MG tablet Take 1 tablet (40 mg total) by mouth daily. 04/27/22   [name] A, FNP  pregabalin (LYRICA) 150 MG capsule TAKE UP TO 2 CAPSULES EVERY MORNING & 1 CAPSULE EVERY EVENING Patient not taking: Reported on 04/08/2022 07/23/21   Junie Spencer, FNP      Allergies    Hydrocodone-acetaminophen and Gadolinium derivatives    Review of Systems   Review of Systems  Respiratory:  Positive for shortness of breath.   Cardiovascular:  Positive for chest pain.  All other systems reviewed and are negative.   Physical Exam Updated Vital Signs BP 103/80   Pulse 86   Temp 98.3 F (36.8 C) (Oral)   Resp (!) 21   Ht 5\' 8"  (1.727 m)   Wt 80.7 kg   SpO2 95%   BMI 27.06 kg/m  Physical Exam Vitals and nursing note reviewed.  Constitutional:      General: He is not in acute distress.    Appearance: He is well-developed.     Comments: Resting comfortably in bed  HENT:     Head: Normocephalic and atraumatic.  Eyes:     Conjunctiva/sclera: Conjunctivae normal.  Neck:     Vascular: No JVD.  Cardiovascular:     Rate and Rhythm: Normal rate and regular rhythm.     Heart sounds: No murmur heard. Pulmonary:     Effort: Pulmonary effort is normal. No respiratory distress.     Breath sounds: Normal breath sounds. No decreased breath sounds, wheezing, rhonchi or rales.  Chest:     Chest wall: Tenderness (left anterior chest wall) present.  Abdominal:     Palpations: Abdomen is soft.     Tenderness: There is no abdominal tenderness.  Musculoskeletal:        General: No swelling.     Cervical back: Neck supple.     Right lower leg: No edema.     Left lower leg: No edema.  Skin:    General: Skin is warm and dry.     Capillary Refill: Capillary refill takes less than 2 seconds.     Findings: No rash.  Neurological:     General: No focal deficit present.     Mental Status: He is  alert and oriented to person, place, and time.  Psychiatric:        Mood and Affect: Mood normal.        Behavior: Behavior normal.     ED Results / Procedures / Treatments   Labs (all labs ordered are listed, but only abnormal results are displayed) Labs Reviewed  BASIC METABOLIC PANEL - Abnormal; Notable for the following components:      Result Value   Sodium 133 (*)    Chloride 97 (*)    Glucose, Bld 305 (*)    BUN 36 (*)    Calcium  8.6 (*)    All other components within normal limits  CBC - Abnormal; Notable for the following components:   RBC 3.69 (*)    Hemoglobin 11.6 (*)    HCT 32.7 (*)    Platelets 142 (*)    All other components within normal limits  TROPONIN I (HIGH SENSITIVITY)  TROPONIN I (HIGH SENSITIVITY)    EKG EKG Interpretation  Date/Time:  Friday Jul 16 2022 12:21:58 EDT Ventricular Rate:  104 PR Interval:  126 QRS Duration: 92 QT Interval:  374 QTC Calculation: 491 R Axis:   82 Text Interpretation: Sinus tachycardia Otherwise normal ECG When compared with ECG of 24-Nov-2020 16:58, No significant change was found Confirmed by Margarita Grizzle [phone]) on 07/16/2022 1:49:14 PM  Radiology DG Chest 2 View  Result Date: 07/16/2022 CLINICAL DATA:  Chest pain EXAM: CHEST - 2 VIEW COMPARISON:  X-ray 05/06/2020 FINDINGS: No consolidation, pneumothorax or effusion. No edema. Normal cardiopericardial silhouette. Old left rib fractures and clavicle fracture. Overlapping cardiac leads. IMPRESSION: No acute cardiopulmonary disease. Electronically Signed   By: Karen Kays M.D.   On: 07/16/2022 13:47    Procedures Procedures    Medications Ordered in ED Medications  aspirin chewable tablet 324 mg (324 mg Oral Given 07/16/22 1348)    ED Course/ Medical Decision Making/ A&P             HEART Score: 2                Medical Decision Making Amount and/or Complexity of Data Reviewed Labs: ordered. Radiology: ordered.  This patient presents to the ED for  concern of chest pain, this involves an extensive number of treatment options, and is a complaint that carries with it a high risk of complications and morbidity.  The emergent differential diagnosis of chest pain includes: Acute coronary syndrome, pericarditis, aortic dissection, pulmonary embolism, tension pneumothorax, and esophageal rupture.  I do not believe the patient has an emergent cause of chest pain, other urgent/non-acute considerations include, but are not limited to: chronic angina, aortic stenosis, cardiomyopathy, myocarditis, mitral valve prolapse, pulmonary hypertension, hypertrophic obstructive cardiomyopathy (HOCM), aortic insufficiency, right ventricular hypertrophy, pneumonia, pleuritis, bronchitis, pneumothorax, tumor, gastroesophageal reflux disease (GERD), esophageal spasm, Mallory-Weiss syndrome, peptic ulcer disease, biliary disease, pancreatitis, functional gastrointestinal pain, cervical or thoracic disk disease or arthritis, shoulder arthritis, costochondritis, subacromial bursitis, anxiety or panic attack, herpes zoster, breast disorders, chest wall tumors, thoracic outlet syndrome, mediastinitis.   Co morbidities that complicate the patient evaluation  previous stroke, diabetes, hypertension, hypothyroidism, asthma, anxiety  My initial workup includes ACS rule out  Additional history obtained from: Nursing notes from this visit. Previous records within EMR system PCP visit from yesterday  I ordered, reviewed and interpreted labs which include: CBC, BMP, troponin, delta troponin.  Mild hyponatremia of 133, hyperglycemia of 305, BUN of 36 which is improved from yesterday.  Anemia with hemoglobin of 11.6.  Mild thrombocytopenia with platelets of 142.  I ordered imaging studies including chest x-ray I independently visualized and interpreted imaging which showed normal. I agree with the radiologist interpretation  Cardiac Monitoring:  The patient was maintained on a  cardiac monitor.  I personally viewed and interpreted the cardiac monitored which showed an underlying rhythm of: NSR  Afebrile, hemodynamically stable.  57 year old male presents ED for evaluation of left-sided chest pain.  This is described as sharp and shooting.  There is also tenderness to palpation of the left anterior chest.  Initial and delta troponin negative.  EKG without ischemic changes.  Chest x-ray normal.  Heart score 2 I have low suspicion for ACS as the cause of his symptoms.  Likely costochondritis.  He was encouraged to take ibuprofen for his pain.  He was also encouraged to follow-up with his primary care provider in 1 week for reevaluation of his symptoms.  He was given return precautions.  Stable at discharge.  At this time there does not appear to be any evidence of an acute emergency medical condition and the patient appears stable for discharge with appropriate outpatient follow up. Diagnosis was discussed with patient who verbalizes understanding of care plan and is agreeable to discharge. I have discussed return precautions with patient who verbalizes understanding. Patient encouraged to follow-up with their PCP within 1 week. All questions answered.  Patient's case discussed with Dr. Rosalia Hammers who agrees with plan to discharge with follow-up.   Note: Portions of this report may have been transcribed using voice recognition software. Every effort was made to ensure accuracy; however, inadvertent computerized transcription errors may still be present.        Final Clinical Impression(s) / ED Diagnoses Final diagnoses:  Atypical chest pain    Rx / DC Orders ED Discharge Orders     None         Michelle Piper, Cordelia Poche 07/16/22 1544    Margarita Grizzle, MD 07/20/22 1250

## 2022-07-16 NOTE — ED Triage Notes (Addendum)
Pt arrives in [facility] custody reporting left chest pain since yesterday with radiation down both arms. He is very agitated and requesting to go to [facility]. Pt also notes that his mom just died and nobody told him to allow him to attend her funeral. Pt feels SOB. He was evaluated by a physician yesterday and was advised to seek care at that time.

## 2022-07-16 NOTE — Discharge Instructions (Signed)
You have been seen today for your complaint of chest pain. Your lab work was overall reassuring. Your imaging was reassuring showing abnormalities. Your discharge medications include ibuprofen.  You may take up to 800 mg of ibuprofen every 8 hours for pain. Follow up with: Your primary care doctor in 1 week for reevaluation Please seek immediate medical care if you develop any of the following symptoms: You have nausea or vomiting. You feel sweaty or light-headed. You have a cough with mucus from your lungs (sputum) or you cough up blood. You develop shortness of breath. At this time there does not appear to be the presence of an emergent medical condition, however there is always the potential for conditions to change. Please read and follow the below instructions.  Do not take your medicine if  develop an itchy rash, swelling in your mouth or lips, or difficulty breathing; call 911 and seek immediate emergency medical attention if this occurs.  You may review your lab tests and imaging results in their entirety on your MyChart account.  Please discuss all results of fully with your primary care provider and other specialist at your follow-up visit.  Note: Portions of this text may have been transcribed using voice recognition software. Every effort was made to ensure accuracy; however, inadvertent computerized transcription errors may still be present.

## 2022-07-16 NOTE — Inpatient Diabetes Management (Signed)
Inpatient Diabetes Program Recommendations  AACE/ADA: New Consensus Statement on Inpatient Glycemic Control (2015)  Target Ranges:  Prepandial:   less than 140 mg/dL      Peak postprandial:   less than 180 mg/dL (1-2 hours)      Critically ill patients:  140 - 180 mg/dL   Lab Results  Component Value Date   GLUCAP 159 (H) 04/13/2021   HGBA1C 10.4 (H) 07/15/2022    Review of Glycemic Control  Diabetes history: DM 2 Outpatient Diabetes medications: recently in custody released 5/29, home med rec reports trulicity 3 mg weekly, Farxiga 10 mg Daily, Tresiba 40 units qhs, Metformin 1500 mg Daily  Current orders for Inpatient glycemic control:  None being evaluated in the ED  Inpatient Diabetes Program Recommendations:    If admitted consider  -   Semglee 15 units -   Novolog 0-15 units tid + hs  Thanks,  Christena Deem RN, MSN, BC-ADM Inpatient Diabetes Coordinator Team Pager [phone] (8a-5p)

## 2022-07-17 LAB — TSH: TSH: 0.133 u[IU]/mL — ABNORMAL LOW (ref 0.450–4.500)

## 2022-07-17 LAB — SPECIMEN STATUS REPORT

## 2022-07-19 ENCOUNTER — Other Ambulatory Visit: Payer: Self-pay | Admitting: Family

## 2022-07-19 MED ORDER — LEVOTHYROXINE SODIUM 150 MCG PO TABS: 150.0000 ug | ORAL_TABLET | Freq: Every day | ORAL | 1 refills | Status: DC

## 2022-07-20 ENCOUNTER — Telehealth: Payer: Self-pay

## 2022-07-20 NOTE — Telephone Encounter (Signed)
Transition Care Management Unsuccessful Follow-up Telephone Call  Date of discharge and from where:  07/16/2022 [facility]  Attempts:  1st Attempt  Reason for unsuccessful TCM follow-up call:  Unable to reach patient  Seline Enzor [facility]  [facility] Population Health Community Resource Care Guide   [email or web address]  ?? [phone]   Website: [email or web address]  [email or web address]

## 2022-07-21 ENCOUNTER — Telehealth: Payer: Self-pay

## 2022-07-21 NOTE — Telephone Encounter (Signed)
Transition Care Management Unsuccessful Follow-up Telephone Call  Date of discharge and from where:  07/16/2022 [facility]  Attempts:  2nd Attempt  Reason for unsuccessful TCM follow-up call:  No answer/busy  Julie Paolini [facility]  [facility] Population Health Community Resource Care Guide   [email or web address]  ?? [phone]   Website: [email or web address]  [email or web address]

## 2022-09-03 ENCOUNTER — Telehealth: Payer: Self-pay | Admitting: Family

## 2022-09-03 NOTE — Telephone Encounter (Signed)
Patient aware and verbalized understanding. °

## 2022-09-03 NOTE — Telephone Encounter (Signed)
Patient is not feeling well - sore throat and wants to talk to PCP or her nurse. Offered appts and he said he was unable to come in because his mom passed and he does not have a ride. Please call back and advise.

## 2022-09-03 NOTE — Telephone Encounter (Signed)
Pt needs to be seen.  ? ?[name], FNP ? ?

## 2022-09-03 NOTE — Telephone Encounter (Signed)
Please look at hospital visit patient is weak and can't eat sore on tongue can not come in ad can not do video visit. What can you suggest for patient?

## 2022-09-06 ENCOUNTER — Ambulatory Visit: Payer: Medicare HMO | Admitting: Family

## 2022-09-06 ENCOUNTER — Encounter: Payer: Self-pay | Admitting: Family

## 2022-09-06 VITALS — BP 120/80 | HR 61 | Temp 97.8°F | Ht 68.0 in | Wt 189.6 lb

## 2022-09-06 DIAGNOSIS — F1910 Other psychoactive substance abuse, uncomplicated: Secondary | ICD-10-CM

## 2022-09-06 DIAGNOSIS — M25511 Pain in right shoulder: Secondary | ICD-10-CM | POA: Diagnosis not present

## 2022-09-06 DIAGNOSIS — E1159 Type 2 diabetes mellitus with other circulatory complications: Secondary | ICD-10-CM

## 2022-09-06 DIAGNOSIS — G8929 Other chronic pain: Secondary | ICD-10-CM

## 2022-09-06 DIAGNOSIS — E039 Hypothyroidism, unspecified: Secondary | ICD-10-CM | POA: Diagnosis not present

## 2022-09-06 DIAGNOSIS — Z794 Long term (current) use of insulin: Secondary | ICD-10-CM

## 2022-09-06 DIAGNOSIS — M79641 Pain in right hand: Secondary | ICD-10-CM

## 2022-09-06 DIAGNOSIS — I152 Hypertension secondary to endocrine disorders: Secondary | ICD-10-CM

## 2022-09-06 DIAGNOSIS — E118 Type 2 diabetes mellitus with unspecified complications: Secondary | ICD-10-CM

## 2022-09-06 DIAGNOSIS — E119 Type 2 diabetes mellitus without complications: Secondary | ICD-10-CM

## 2022-09-06 LAB — CBC WITH DIFFERENTIAL/PLATELET
Basophils Absolute: 0.1 10*3/uL (ref 0.0–0.2)
EOS (ABSOLUTE): 0.4 10*3/uL (ref 0.0–0.4)
Eos: 4 %
Hemoglobin: 11.8 g/dL — ABNORMAL LOW (ref 13.0–17.7)
Lymphocytes Absolute: 3 10*3/uL (ref 0.7–3.1)
Lymphs: 31 %
MCH: 31.4 pg (ref 26.6–33.0)
MCHC: 33.4 g/dL (ref 31.5–35.7)
MCV: 94 fL (ref 79–97)
Monocytes Absolute: 0.6 10*3/uL (ref 0.1–0.9)
Neutrophils Absolute: 5.6 10*3/uL (ref 1.4–7.0)
Platelets: 275 10*3/uL (ref 150–450)
RBC: 3.76 x10E6/uL — ABNORMAL LOW (ref 4.14–5.80)
WBC: 9.6 10*3/uL (ref 3.4–10.8)

## 2022-09-06 LAB — CMP14+EGFR
ALT: 24 IU/L (ref 0–44)
Albumin: 3.6 g/dL — ABNORMAL LOW (ref 3.8–4.9)
Bilirubin Total: 0.2 mg/dL (ref 0.0–1.2)
CO2: 24 mmol/L (ref 20–29)
Globulin, Total: 2.3 g/dL (ref 1.5–4.5)
Glucose: 393 mg/dL — ABNORMAL HIGH (ref 70–99)

## 2022-09-06 LAB — LIPID PANEL

## 2022-09-06 LAB — TSH

## 2022-09-06 LAB — BAYER DCA HB A1C WAIVED: HB A1C (BAYER DCA - WAIVED): 7.9 % — ABNORMAL HIGH (ref 4.8–5.6)

## 2022-09-06 NOTE — Progress Notes (Signed)
Subjective:    Patient ID: Charles Pennington, male    DOB: May 14, 1965, 57 y.o.   MRN: [id number]  Chief Complaint  Patient presents with   Medical Management of Chronic Issues   sore in mouth    Hospitalization Follow-up    They tested for COVID and strep -   Pt presents to the office today for hospital follow up. He went to the ED yesterday for weakness, sore on his tongue, and dizziness.   Complaining of right hand pain and can not make a fist. Reports this has been on going for over a year, but over the last few months it has worsen.   Also complaining of right shoulder pain of 10 of 10 that is worse at night that has been for 3-6 months after falling. Reports he was suppose to have surgery, but had elevated A1C.  Diabetes He presents for his follow-up diabetic visit. He has type 2 diabetes mellitus. Hypoglycemia symptoms include nervousness/anxiousness. Associated symptoms include blurred vision, fatigue and foot paresthesias. Diabetic complications include a CVA, heart disease and peripheral neuropathy. Risk factors for coronary artery disease include dyslipidemia, diabetes mellitus, hypertension and sedentary lifestyle. An ACE inhibitor/angiotensin II receptor blocker is being taken.  Thyroid Problem Presents for follow-up visit. Symptoms include anxiety, depressed mood, diarrhea, fatigue and hoarse voice. The symptoms have been stable.  Hypertension This is a chronic problem. The current episode started more than 1 year ago. The problem has been resolved since onset. Associated symptoms include blurred vision and malaise/fatigue. Pertinent negatives include no peripheral edema. Risk factors for coronary artery disease include dyslipidemia, diabetes mellitus, male gender and sedentary lifestyle. Hypertensive end-organ damage includes CVA. Identifiable causes of hypertension include a thyroid problem.      Review of Systems  Constitutional:  Positive for fatigue and  malaise/fatigue.  HENT:  Positive for hoarse voice.   Eyes:  Positive for blurred vision.  Gastrointestinal:  Positive for diarrhea.  Psychiatric/Behavioral:  The patient is nervous/anxious.   All other systems reviewed and are negative.      Objective:   Physical Exam Vitals reviewed.  Constitutional:      General: He is not in acute distress.    Appearance: He is well-developed.  HENT:     Head: Normocephalic.     Right Ear: Tympanic membrane normal.     Left Ear: Tympanic membrane normal.     Mouth/Throat:      Comments: Small ulcer on left tongue Eyes:     General:        Right eye: No discharge.        Left eye: No discharge.     Pupils: Pupils are equal, round, and reactive to light.  Neck:     Thyroid: No thyromegaly.  Cardiovascular:     Rate and Rhythm: Normal rate and regular rhythm.     Heart sounds: Normal heart sounds. No murmur heard. Pulmonary:     Effort: Pulmonary effort is normal. No respiratory distress.     Breath sounds: Normal breath sounds. No wheezing.  Abdominal:     General: Bowel sounds are normal. There is no distension.     Palpations: Abdomen is soft.     Tenderness: There is no abdominal tenderness.  Musculoskeletal:        General: No tenderness. Normal range of motion.     Cervical back: Normal range of motion and neck supple.  Skin:    General: Skin is warm and dry.  Coloration: Skin is pale. Skin is not jaundiced.     Findings: No erythema or rash.  Neurological:     Mental Status: He is alert and oriented to person, place, and time.     Cranial Nerves: No cranial nerve deficit.     Deep Tendon Reflexes: Reflexes are normal and symmetric.  Psychiatric:        Behavior: Behavior normal.        Thought Content: Thought content normal.        Judgment: Judgment normal.     BP 120/80   Pulse 61   Temp 97.8 F (36.6 C) (Temporal)   Ht 5\' 8"  (1.727 m)   Wt 189 lb 9.6 oz (86 kg)   SpO2 97%   BMI 28.83 kg/m         Assessment & Plan:  Charles Pennington comes in today with chief complaint of Medical Management of Chronic Issues, sore in mouth , and Hospitalization Follow-up (They tested for COVID and strep -)   Diagnosis and orders addressed:  1. Diabetes mellitus with complication (HCC) - CBC with Differential/Platelet - CMP14+EGFR - Bayer DCA Hb A1c Waived  2. Hypothyroidism, unspecified type - TSH  3. Type 2 diabetes mellitus treated with insulin (HCC)  4. Hypertension associated with diabetes (HCC)  5. Substance abuse (HCC)  6. Chronic right shoulder pain - Ambulatory referral to Orthopedic Surgery  7. Right hand pain - Ambulatory referral to Orthopedic Surgery   Labs pending Continue current medications, his A1C is the best it has been in awhile. He was recently in jail and was taking his medications regularly.  Low carb diet  Referral to Ortho Health Maintenance reviewed Diet and exercise encouraged  Follow up plan: 1 month    [name], FNP

## 2022-09-06 NOTE — Patient Instructions (Signed)

## 2022-09-07 ENCOUNTER — Other Ambulatory Visit: Payer: Self-pay | Admitting: Family

## 2022-09-07 LAB — CBC WITH DIFFERENTIAL/PLATELET
Basos: 1 %
Hematocrit: 35.3 % — ABNORMAL LOW (ref 37.5–51.0)
Immature Grans (Abs): 0 10*3/uL (ref 0.0–0.1)
Immature Granulocytes: 0 %
Monocytes: 7 %
Neutrophils: 57 %
RDW: 12.4 % (ref 11.6–15.4)

## 2022-09-07 LAB — CMP14+EGFR
AST: 17 IU/L (ref 0–40)
Alkaline Phosphatase: 90 IU/L (ref 44–121)
BUN: 11 mg/dL (ref 6–24)
Calcium: 8.9 mg/dL (ref 8.7–10.2)
Chloride: 99 mmol/L (ref 96–106)
Creatinine, Ser: 0.64 mg/dL — ABNORMAL LOW (ref 0.76–1.27)
Potassium: 3.5 mmol/L (ref 3.5–5.2)
Sodium: 137 mmol/L (ref 134–144)
Total Protein: 5.9 g/dL — ABNORMAL LOW (ref 6.0–8.5)

## 2022-09-07 LAB — LIPID PANEL
Cholesterol, Total: 84 mg/dL — ABNORMAL LOW (ref 100–199)
Triglycerides: 77 mg/dL (ref 0–149)
VLDL Cholesterol Cal: 16 mg/dL (ref 5–40)

## 2022-09-07 MED ORDER — LEVOTHYROXINE SODIUM 125 MCG PO TABS: 125.0000 ug | ORAL_TABLET | Freq: Every day | ORAL | 1 refills | Status: AC

## 2022-09-07 MED ORDER — CEPHALEXIN 500 MG PO CAPS: 500.0000 mg | ORAL_CAPSULE | Freq: Three times a day (TID) | ORAL | 0 refills | Status: AC

## 2022-09-07 NOTE — Addendum Note (Signed)
Addended by: [name] A on: 09/07/2022 04:07 PM   Modules accepted: Orders

## 2022-09-16 ENCOUNTER — Telehealth: Payer: Self-pay | Admitting: Family

## 2022-09-16 DIAGNOSIS — G6289 Other specified polyneuropathies: Secondary | ICD-10-CM

## 2022-09-16 NOTE — Telephone Encounter (Signed)
Pt asking to talk to [place] at a referral for neuropathy in feet. Please call back

## 2022-09-17 NOTE — Telephone Encounter (Signed)
Referral to Pain Clinic placed.  ? ?[name], FNP ? ?

## 2022-09-20 ENCOUNTER — Ambulatory Visit: Admitting: Physician Assistant

## 2022-09-21 ENCOUNTER — Ambulatory Visit (INDEPENDENT_AMBULATORY_CARE_PROVIDER_SITE_OTHER): Payer: Medicare Other | Admitting: Family

## 2022-09-21 ENCOUNTER — Other Ambulatory Visit: Payer: Self-pay | Admitting: Family

## 2022-09-21 ENCOUNTER — Encounter: Payer: Self-pay | Admitting: Family

## 2022-09-21 VITALS — BP 143/98 | HR 115 | Temp 97.8°F | Ht 67.0 in | Wt 186.2 lb

## 2022-09-21 DIAGNOSIS — T63301A Toxic effect of unspecified spider venom, accidental (unintentional), initial encounter: Secondary | ICD-10-CM

## 2022-09-21 DIAGNOSIS — M25511 Pain in right shoulder: Secondary | ICD-10-CM | POA: Diagnosis not present

## 2022-09-21 DIAGNOSIS — M545 Low back pain, unspecified: Secondary | ICD-10-CM | POA: Diagnosis not present

## 2022-09-21 DIAGNOSIS — G4700 Insomnia, unspecified: Secondary | ICD-10-CM

## 2022-09-21 MED ORDER — CEFTRIAXONE SODIUM 1 G IJ SOLR
1.0000 g | Freq: Once | INTRAMUSCULAR | Status: AC
Start: 2022-09-21 — End: 2022-09-21
  Administered 2022-09-21: 1 g via INTRAMUSCULAR

## 2022-09-21 MED ORDER — DOXYCYCLINE HYCLATE 100 MG PO TABS
100.0000 mg | ORAL_TABLET | Freq: Two times a day (BID) | ORAL | 0 refills | Status: AC
Start: 2022-09-21 — End: ?

## 2022-09-21 NOTE — Progress Notes (Signed)
   Subjective:    Patient ID: Charles Pennington, male    DOB: Nov 16, 1965, 57 y.o.   MRN: [id number]  Chief Complaint  Patient presents with   Insect Bite   PT presents to the office today with left arm swelling after a spider bite two days. He is living in a tent.  Rash This is a new problem. The current episode started in the past 7 days. Location: left arm. The rash is characterized by redness. He was exposed to a spider bite. Past treatments include antibiotic cream. The treatment provided no relief.      Review of Systems  Skin:  Positive for rash.  All other systems reviewed and are negative.      Objective:   Physical Exam Vitals reviewed.  Constitutional:      General: He is not in acute distress.    Appearance: He is well-developed.  HENT:     Head: Normocephalic.  Eyes:     General:        Right eye: No discharge.        Left eye: No discharge.     Pupils: Pupils are equal, round, and reactive to light.  Neck:     Thyroid: No thyromegaly.  Cardiovascular:     Rate and Rhythm: Normal rate and regular rhythm.     Heart sounds: Normal heart sounds. No murmur heard. Pulmonary:     Effort: Pulmonary effort is normal. No respiratory distress.     Breath sounds: Normal breath sounds. No wheezing.  Abdominal:     General: Bowel sounds are normal. There is no distension.     Palpations: Abdomen is soft.     Tenderness: There is no abdominal tenderness.  Musculoskeletal:        General: Tenderness present.       Arms:     Cervical back: Normal range of motion and neck supple.     Comments: Left elbow swelling, tenderness, warmth  Skin:    General: Skin is warm and dry.     Findings: No erythema or rash.  Neurological:     Mental Status: He is alert and oriented to person, place, and time.     Cranial Nerves: No cranial nerve deficit.     Deep Tendon Reflexes: Reflexes are normal and symmetric.  Psychiatric:        Behavior: Behavior normal.        Thought  Content: Thought content normal.        Judgment: Judgment normal.     BP (!) 143/98   Pulse (!) 115   Temp 97.8 F (36.6 C) (Temporal)   Ht 5\' 7"  (1.702 m)   Wt 186 lb 3.2 oz (84.5 kg)   BMI 29.16 kg/m       Assessment & Plan:  Charles Pennington comes in today with chief complaint of Insect Bite   Diagnosis and orders addressed:  1. Spider bite wound, accidental or unintentional, initial encounter Keep clean and dry  Start doxycyline  Rocephin given today Need good control of glucose  Report any increased redness, swelling, or tenderness Follow up in 1 week  - doxycycline (VIBRA-TABS) 100 MG tablet; Take 1 tablet (100 mg total) by mouth 2 (two) times daily.  Dispense: 20 tablet; Refill: 0 - cefTRIAXone (ROCEPHIN) injection 1 g   [name], FNP

## 2022-09-21 NOTE — Patient Instructions (Signed)
Spider Bite Spider bites are not common. When spider bites do happen, most do not cause serious health problems. There are only a few types of spider bites that can cause serious health problems. What are the causes? A spider bite is often caused by a person accidentally making contact with a spider in a way that traps the spider against the skin. What increases the risk? You are more likely to be bitten by a spider if: You live in an area where spiders live, and you disturb their habitat. You work outdoors, such as a Clinical biochemist. You participate in certain outdoor activities, such as playing in leaves or hiking. What are the signs or symptoms? Symptoms may vary depending on the type of spider. Some spider bites may cause symptoms within 1 hour after the bite. For other spider bites, it may take 1-2 days for symptoms to develop. Common symptoms of this condition include: A raised area that is red. Redness and swelling around the area of the bite or bites. Discomfort or pain in the area of the bite. A few types of spiders, such as the black widow or the brown recluse, can inject poison (venom) into a bite wound. This venom causes more serious symptoms. Symptoms of a venomous spider bite vary and may include: Muscle cramps. Nausea, vomiting, or abdominal pain. A fever. A skin sore (lesion) that spreads. This can break into an open wound (skin ulcer). Light-headedness or dizziness. How is this diagnosed? This condition may be diagnosed based on your symptoms and a physical exam. Your health care provider will ask about the history of your injury and any details you may have about the spider. This may help to determine what type of spider bit you. How is this treated? Many spider bites do not require treatment. If needed, this condition may be treated by: Icing and keeping the bite area raised (elevated). Taking or applying over-the-counter or prescription medicines to help control  symptoms such as pain and itching. Having a tetanus shot, if needed. Taking antibiotic medicine. Follow these instructions at home: Medicines Take or apply over-the-counter and prescription medicines only as told by your health care provider. If you were prescribed an antibiotic medicine, take or apply it as told by your health care provider. Do not stop using the antibiotic even if you start to feel better or if your condition improves. Managing pain and swelling  If directed, put ice on the bite area. To do this: Put ice in a plastic bag. Place a towel between your skin and the bag. Leave the ice on for 20 minutes, 2-3 times a day. Remove the ice if your skin turns bright red. This is very important. If you cannot feel pain, heat, or cold, you have a greater risk of damage to the area. Elevate the bite area above the level of your heart while you are sitting or lying down. General instructions  Do not scratch the bite area. Keep the bite area clean and dry. Wash the bite area daily with soap and water as told by your health care provider. Keep all follow-up visits. This is important. Contact a health care provider if: Your bite does not get better after 3 days of treatment. Your bite turns black or purple. You have increased redness, swelling, or pain at the site of the bite. Get help right away if: You develop shortness of breath or chest pain. You have fluid, blood, or pus coming from the bite area. You have  muscle cramps or painful muscle spasms. You develop abdominal pain, nausea, or vomiting. You feel unusually tired (fatigued) or sleepy. These symptoms may represent a serious problem that is an emergency. Do not wait to see if the symptoms will go away. Get medical help right away. Call your local emergency services (911 in the [place] Do not drive yourself to the hospital. Summary Spider bites are not common. When spider bites do happen, most do not cause serious health  problems. Take or apply over-the-counter and prescription medicines only as told by your health care provider. Keep the bite area clean and dry. Wash the bite area daily with soap and water as told by your health care provider. Contact a health care provider if you have increased redness, swelling, or pain at the site of the bite. Get help right away if you develop shortness of breath or chest pain. This information is not intended to replace advice given to you by your health care provider. Make sure you discuss any questions you have with your health care provider. Document Revised: 11/21/2019 Document Reviewed: 11/21/2019 [employer] Patient Education  2024 [employer].

## 2022-10-07 ENCOUNTER — Ambulatory Visit: Payer: Medicaid Other | Admitting: Family

## 2022-10-08 ENCOUNTER — Encounter: Payer: Self-pay | Admitting: Family

## 2023-01-25 ENCOUNTER — Telehealth: Payer: Self-pay | Admitting: Family

## 2023-09-09 ENCOUNTER — Telehealth: Payer: Self-pay | Admitting: Family
